# Patient Record
Sex: Male | Born: 1955 | Race: Black or African American | Hispanic: Refuse to answer | Marital: Single | Smoking: Never smoker
Health system: Southern US, Community
[De-identification: ages and names within clinical notes are randomized; demographics above are authoritative.]

## PROBLEM LIST (undated history)

## (undated) DIAGNOSIS — A86 Unspecified viral encephalitis: Secondary | ICD-10-CM

## (undated) DIAGNOSIS — I2510 Atherosclerotic heart disease of native coronary artery without angina pectoris: Secondary | ICD-10-CM

## (undated) DIAGNOSIS — G2581 Restless legs syndrome: Secondary | ICD-10-CM

## (undated) DIAGNOSIS — R351 Nocturia: Secondary | ICD-10-CM

## (undated) DIAGNOSIS — M1990 Unspecified osteoarthritis, unspecified site: Secondary | ICD-10-CM

## (undated) DIAGNOSIS — E785 Hyperlipidemia, unspecified: Secondary | ICD-10-CM

## (undated) DIAGNOSIS — M549 Dorsalgia, unspecified: Secondary | ICD-10-CM

## (undated) DIAGNOSIS — I255 Ischemic cardiomyopathy: Secondary | ICD-10-CM

## (undated) DIAGNOSIS — I00 Rheumatic fever without heart involvement: Secondary | ICD-10-CM

## (undated) DIAGNOSIS — I10 Essential (primary) hypertension: Secondary | ICD-10-CM

## (undated) DIAGNOSIS — N289 Disorder of kidney and ureter, unspecified: Secondary | ICD-10-CM

## (undated) DIAGNOSIS — R55 Syncope and collapse: Secondary | ICD-10-CM

## (undated) DIAGNOSIS — E669 Obesity, unspecified: Secondary | ICD-10-CM

## (undated) DIAGNOSIS — M2550 Pain in unspecified joint: Secondary | ICD-10-CM

## (undated) DIAGNOSIS — I440 Atrioventricular block, first degree: Secondary | ICD-10-CM

## (undated) DIAGNOSIS — N401 Enlarged prostate with lower urinary tract symptoms: Secondary | ICD-10-CM

## (undated) DIAGNOSIS — K219 Gastro-esophageal reflux disease without esophagitis: Secondary | ICD-10-CM

## (undated) HISTORY — DX: Pain in unspecified joint: M25.50

## (undated) HISTORY — PX: TONSILLECTOMY: SHX5217

## (undated) HISTORY — DX: Rheumatic fever without heart involvement: I00

## (undated) HISTORY — PX: POLYPECTOMY: SHX149

## (undated) HISTORY — DX: Unspecified viral encephalitis: A86

## (undated) HISTORY — DX: Gastro-esophageal reflux disease without esophagitis: K21.9

## (undated) HISTORY — DX: Unspecified osteoarthritis, unspecified site: M19.90

## (undated) HISTORY — PX: REPLACEMENT TOTAL KNEE: SUR1224

## (undated) HISTORY — DX: Dorsalgia, unspecified: M54.9

## (undated) HISTORY — DX: Essential (primary) hypertension: I10

## (undated) HISTORY — DX: Syncope and collapse: R55

---

## 2001-05-07 ENCOUNTER — Ambulatory Visit (HOSPITAL_BASED_OUTPATIENT_CLINIC_OR_DEPARTMENT_OTHER): Admission: RE | Admit: 2001-05-07 | Discharge: 2001-05-07 | Payer: Self-pay | Admitting: *Deleted

## 2002-08-28 ENCOUNTER — Emergency Department (HOSPITAL_COMMUNITY): Admission: EM | Admit: 2002-08-28 | Discharge: 2002-08-28 | Payer: Self-pay | Admitting: Emergency Medicine

## 2002-08-28 ENCOUNTER — Encounter: Payer: Self-pay | Admitting: Emergency Medicine

## 2002-12-02 ENCOUNTER — Inpatient Hospital Stay (HOSPITAL_COMMUNITY): Admission: RE | Admit: 2002-12-02 | Discharge: 2002-12-03 | Payer: Self-pay | Admitting: Orthopedic Surgery

## 2002-12-02 ENCOUNTER — Encounter: Payer: Self-pay | Admitting: Orthopedic Surgery

## 2004-02-23 ENCOUNTER — Inpatient Hospital Stay (HOSPITAL_COMMUNITY): Admission: RE | Admit: 2004-02-23 | Discharge: 2004-02-27 | Payer: Self-pay | Admitting: Orthopedic Surgery

## 2004-02-23 ENCOUNTER — Encounter (INDEPENDENT_AMBULATORY_CARE_PROVIDER_SITE_OTHER): Payer: Self-pay | Admitting: *Deleted

## 2004-02-29 ENCOUNTER — Ambulatory Visit (HOSPITAL_COMMUNITY): Admission: RE | Admit: 2004-02-29 | Discharge: 2004-02-29 | Payer: Self-pay | Admitting: Neurology

## 2004-12-19 ENCOUNTER — Encounter: Admission: RE | Admit: 2004-12-19 | Discharge: 2004-12-19 | Payer: Self-pay | Admitting: Orthopedic Surgery

## 2005-07-19 ENCOUNTER — Ambulatory Visit: Payer: Self-pay | Admitting: Family Medicine

## 2005-12-11 ENCOUNTER — Encounter: Admission: RE | Admit: 2005-12-11 | Discharge: 2005-12-11 | Payer: Self-pay | Admitting: Orthopedic Surgery

## 2006-09-27 ENCOUNTER — Ambulatory Visit: Payer: Self-pay | Admitting: Family Medicine

## 2007-02-18 ENCOUNTER — Ambulatory Visit: Payer: Self-pay | Admitting: Family Medicine

## 2007-02-19 LAB — CONVERTED CEMR LAB
Alkaline Phosphatase: 83 units/L (ref 39–117)
BUN: 10 mg/dL (ref 6–23)
Basophils Absolute: 0.1 10*3/uL (ref 0.0–0.1)
Chloride: 103 meq/L (ref 96–112)
Cholesterol: 205 mg/dL (ref 0–200)
Creatinine, Ser: 1 mg/dL (ref 0.4–1.5)
Eosinophils Absolute: 0.2 10*3/uL (ref 0.0–0.6)
Glucose, Bld: 79 mg/dL (ref 70–99)
HCT: 41.9 % (ref 39.0–52.0)
HDL: 28.4 mg/dL — ABNORMAL LOW (ref >39.0)
Lymphocytes Relative: 26 % (ref 12.0–46.0)
MCHC: 33.5 g/dL (ref 30.0–36.0)
MCV: 88.5 fL (ref 78.0–100.0)
Monocytes Absolute: 0.4 10*3/uL (ref 0.2–0.7)
Monocytes Relative: 2.9 % — ABNORMAL LOW (ref 3.0–11.0)
PSA: 0.72 ng/mL (ref 0.10–4.00)
Platelets: 330 10*3/uL (ref 150–400)
Potassium: 4.4 meq/L (ref 3.5–5.1)
RDW: 13 % (ref 11.5–14.6)
Sodium: 143 meq/L (ref 135–145)
Total Bilirubin: 0.6 mg/dL (ref 0.3–1.2)
VLDL: 83 mg/dL — ABNORMAL HIGH (ref 0–40)

## 2007-08-07 ENCOUNTER — Ambulatory Visit (HOSPITAL_COMMUNITY): Admission: RE | Admit: 2007-08-07 | Discharge: 2007-08-07 | Payer: Self-pay | Admitting: Orthopedic Surgery

## 2007-10-15 ENCOUNTER — Encounter (INDEPENDENT_AMBULATORY_CARE_PROVIDER_SITE_OTHER): Payer: Self-pay | Admitting: Orthopedic Surgery

## 2007-10-15 ENCOUNTER — Inpatient Hospital Stay (HOSPITAL_COMMUNITY): Admission: RE | Admit: 2007-10-15 | Discharge: 2007-10-18 | Payer: Self-pay | Admitting: Orthopedic Surgery

## 2007-10-31 DIAGNOSIS — R55 Syncope and collapse: Secondary | ICD-10-CM

## 2007-10-31 HISTORY — DX: Syncope and collapse: R55

## 2007-11-22 ENCOUNTER — Ambulatory Visit: Payer: Self-pay | Admitting: Family Medicine

## 2007-11-22 DIAGNOSIS — M1990 Unspecified osteoarthritis, unspecified site: Secondary | ICD-10-CM | POA: Insufficient documentation

## 2007-12-04 ENCOUNTER — Encounter: Payer: Self-pay | Admitting: Family Medicine

## 2007-12-04 ENCOUNTER — Ambulatory Visit: Payer: Self-pay

## 2007-12-17 ENCOUNTER — Telehealth: Payer: Self-pay | Admitting: Family Medicine

## 2008-01-20 ENCOUNTER — Ambulatory Visit: Payer: Self-pay | Admitting: Family Medicine

## 2008-04-02 ENCOUNTER — Ambulatory Visit: Payer: Self-pay | Admitting: Family Medicine

## 2008-04-02 DIAGNOSIS — K219 Gastro-esophageal reflux disease without esophagitis: Secondary | ICD-10-CM | POA: Insufficient documentation

## 2008-04-02 LAB — CONVERTED CEMR LAB
Alkaline Phosphatase: 107 units/L (ref 39–117)
BUN: 7 mg/dL (ref 6–23)
CO2: 31 meq/L (ref 19–32)
Calcium: 9.1 mg/dL (ref 8.4–10.5)
Chloride: 100 meq/L (ref 96–112)
Creatinine, Ser: 1.1 mg/dL (ref 0.4–1.5)
Eosinophils Absolute: 0.2 10*3/uL (ref 0.0–0.7)
Eosinophils Relative: 2.2 % (ref 0.0–5.0)
Folate: 10.1 ng/mL
GFR calc Af Amer: 90 mL/min
GFR calc non Af Amer: 74 mL/min
Glucose, Bld: 86 mg/dL (ref 70–99)
HCT: 39.2 % (ref 39.0–52.0)
Hemoglobin: 13.1 g/dL (ref 13.0–17.0)
MCHC: 33.4 g/dL (ref 30.0–36.0)
MCV: 85.9 fL (ref 78.0–100.0)
Monocytes Absolute: 0.7 10*3/uL (ref 0.1–1.0)
Monocytes Relative: 8.7 % (ref 3.0–12.0)
Neutrophils Relative %: 62.7 % (ref 43.0–77.0)
Platelets: 278 10*3/uL (ref 150–400)
Potassium: 3.9 meq/L (ref 3.5–5.1)
Sodium: 138 meq/L (ref 135–145)

## 2008-08-20 ENCOUNTER — Emergency Department (HOSPITAL_COMMUNITY): Admission: EM | Admit: 2008-08-20 | Discharge: 2008-08-20 | Payer: Self-pay | Admitting: Emergency Medicine

## 2008-08-20 ENCOUNTER — Ambulatory Visit: Payer: Self-pay | Admitting: Family Medicine

## 2008-08-25 ENCOUNTER — Ambulatory Visit: Payer: Self-pay | Admitting: Family Medicine

## 2008-12-30 ENCOUNTER — Telehealth: Payer: Self-pay | Admitting: *Deleted

## 2009-05-19 ENCOUNTER — Telehealth: Payer: Self-pay | Admitting: Family Medicine

## 2009-07-09 ENCOUNTER — Ambulatory Visit: Payer: Self-pay | Admitting: Family Medicine

## 2009-07-09 LAB — CONVERTED CEMR LAB
Basophils Absolute: 0 10*3/uL (ref 0.0–0.1)
Basophils Relative: 0.2 % (ref 0.0–3.0)
Bilirubin, Direct: 0.1 mg/dL (ref 0.0–0.3)
CO2: 31 meq/L (ref 19–32)
Calcium: 9.4 mg/dL (ref 8.4–10.5)
Chloride: 104 meq/L (ref 96–112)
Eosinophils Relative: 2.8 % (ref 0.0–5.0)
GFR calc non Af Amer: 99.76 mL/min (ref >60)
Glucose, Bld: 106 mg/dL — ABNORMAL HIGH (ref 70–99)
Glucose, Urine, Semiquant: NEGATIVE
HCT: 42.2 % (ref 39.0–52.0)
Lymphs Abs: 2.6 10*3/uL (ref 0.7–4.0)
MCHC: 33.7 g/dL (ref 30.0–36.0)
MCV: 88.2 fL (ref 78.0–100.0)
Monocytes Absolute: 0.7 10*3/uL (ref 0.1–1.0)
Monocytes Relative: 8.2 % (ref 3.0–12.0)
Platelets: 237 10*3/uL (ref 150.0–400.0)
RBC: 4.78 M/uL (ref 4.22–5.81)
Sodium: 140 meq/L (ref 135–145)
Total Bilirubin: 0.9 mg/dL (ref 0.3–1.2)
Triglycerides: 190 mg/dL — ABNORMAL HIGH (ref 0.0–149.0)
Urobilinogen, UA: 0.2
WBC: 8.1 10*3/uL (ref 4.5–10.5)

## 2009-12-22 ENCOUNTER — Ambulatory Visit: Payer: Self-pay | Admitting: Family Medicine

## 2010-01-21 ENCOUNTER — Telehealth: Payer: Self-pay | Admitting: Family Medicine

## 2010-08-18 ENCOUNTER — Ambulatory Visit: Payer: Self-pay | Admitting: Family Medicine

## 2010-08-18 DIAGNOSIS — R1013 Epigastric pain: Secondary | ICD-10-CM | POA: Insufficient documentation

## 2010-08-19 ENCOUNTER — Encounter: Payer: Self-pay | Admitting: Family Medicine

## 2010-08-19 LAB — CONVERTED CEMR LAB
AST: 19 units/L (ref 0–37)
Albumin: 3.9 g/dL (ref 3.5–5.2)
Amylase: 47 units/L (ref 27–131)
BUN: 12 mg/dL (ref 6–23)
Basophils Absolute: 0 10*3/uL (ref 0.0–0.1)
Chloride: 103 meq/L (ref 96–112)
Eosinophils Relative: 3.1 % (ref 0.0–5.0)
HCT: 38.8 % — ABNORMAL LOW (ref 39.0–52.0)
Lipase: 26 units/L (ref 11.0–59.0)
Lymphs Abs: 3 10*3/uL (ref 0.7–4.0)
MCHC: 34.3 g/dL (ref 30.0–36.0)
MCV: 88.8 fL (ref 78.0–100.0)
Monocytes Absolute: 0.8 10*3/uL (ref 0.1–1.0)
Monocytes Relative: 8.2 % (ref 3.0–12.0)
Platelets: 275 10*3/uL (ref 150.0–400.0)
Potassium: 4.1 meq/L (ref 3.5–5.1)
RBC: 4.37 M/uL (ref 4.22–5.81)
Total Bilirubin: 0.4 mg/dL (ref 0.3–1.2)

## 2010-09-16 ENCOUNTER — Ambulatory Visit: Payer: Self-pay | Admitting: Family Medicine

## 2010-09-16 DIAGNOSIS — G2581 Restless legs syndrome: Secondary | ICD-10-CM | POA: Insufficient documentation

## 2010-09-16 HISTORY — DX: Restless legs syndrome: G25.81

## 2010-11-01 ENCOUNTER — Encounter: Payer: Self-pay | Admitting: Family Medicine

## 2010-11-29 ENCOUNTER — Ambulatory Visit
Admission: RE | Admit: 2010-11-29 | Discharge: 2010-11-29 | Payer: Self-pay | Source: Home / Self Care | Attending: Family Medicine | Admitting: Family Medicine

## 2010-12-01 NOTE — Assessment & Plan Note (Signed)
Summary: acute painful foot/dm   Vital Signs:  Patient profile:   55 year old male Weight:      260 pounds Temp:     98.3 degrees F oral BP sitting:   162 / 100  (left arm) Cuff size:   regular  Vitals Entered By: Kern Reap CMA Duncan Dull) (December 22, 2009 9:42 AM)  Reason for Visit right heel pain  History of Present Illness: Markelle is a 55 year old male, who comes in today for evaluation of two problems.  He said pain in his right heel for two weeks.  No history of trauma.  He ran out of his Zestril 40 mg daily 3 days ago.  BP 162 over hundred  Allergies: 1)  ! Penicillin  Past History:  Past medical, surgical, family and social histories (including risk factors) reviewed for relevance to current acute and chronic problems.  Past Medical History: Reviewed history from 08/20/2008 and no changes required. rheumatic fever Hypertension Osteoarthritis encephalitis, viral GERD history of syncope  Past Surgical History: Reviewed history from 11/22/2007 and no changes required. Total knee replacement, left  Family History: Reviewed history from 01/20/2008 and no changes required. father died at 55 of prostate cancer mother is mid-70 alive and well except she has high blood pressure 4 brothers, one diabetic and hypertensive.  The other hypertensive in the two in good health 3 sisters in good health  Social History: Reviewed history from 01/20/2008 and no changes required. Occupation: [employer] Single Never Smoked Alcohol use-no Drug use-no Regular exercise-yes  Review of Systems      See HPI  Physical Exam  General:  Well-developed,well-nourished,in no acute distress; alert,appropriate and cooperative throughout examination Msk:  left TM normal, pain, and palpation of the right heel consistent with plantar fasciitis   Impression & Recommendations:  Problem # 1:  HYPERTENSION (ICD-401.9) Assessment Deteriorated  His updated medication list for this  problem includes:    Zestril 40 Mg Tabs (Lisinopril) .Marland Kitchen... 1 &1/2 qam  Orders: Prescription Created Electronically [id number])  Problem # 2:  PLANTAR FASCIITIS, RIGHT (ICD-728.71) Assessment: New  Orders: Prescription Created Electronically [phone])  Complete Medication List: 1)  Zestril 40 Mg Tabs (Lisinopril) .Marland Kitchen.. 1 &1/2 qam 2)  Viagra 50 Mg Tabs (Sildenafil citrate) .... Uad  Patient Instructions: 1)  take 600 mg of Motrin 3 times a day with food, elevation, and ice 15 minutes prior to bedtime, 60 stretches in the morning, if the pain does not go away in two to 3 weeks.  Call we would be to set up for physical therapy. 2)  Restart her blood pressure medication. 3)  cbc, lipid panel, u/a ,tsh level, liver fcn panel,bmet, and psa prior to next visit (v70.0) ........in the next two to 4 weeks Prescriptions: VIAGRA 50 MG  TABS (SILDENAFIL CITRATE) UAD  #6 Tablet x 10   Entered and Authorized by:   Roderick Pee MD   Signed by:   Roderick Pee MD on 12/22/2009   Method used:   [facility]        [facility] [address] [phone]* (retail)       [address]       [place], [place]  [address]       Ph: [phone] or [phone]       Fax: [phone]   RxID:   [id number] ZESTRIL 40 MG  TABS (LISINOPRIL) 1 &1/2 qam  #150 x 3   Entered  and Authorized by:   Roderick Pee MD   Signed by:   Roderick Pee MD on 12/22/2009   Method used:   [facility]        [facility] [address] [phone]* (retail)       [address]       [place], [place]  [address]       Ph: [phone] or [phone]       Fax: [phone]   RxID:   [id number]

## 2010-12-01 NOTE — Assessment & Plan Note (Signed)
Summary: not feeling well//ccm   Vital Signs:  Patient profile:   55 year old male Height:      71 inches Weight:      259 pounds BMI:     36.25 Temp:     98.5 degrees F oral BP sitting:   140 / 98  (left arm) Cuff size:   regular  Vitals Entered By: Kern Reap CMA Duncan Dull) (August 18, 2010 3:23 PM) CC: chest pressure Is Patient Diabetic? No Pain Assessment Patient in pain? yes     Location: chest Intensity: 3   CC:  chest pressure.  History of Present Illness: Jacob Quinn is a 55 year old male, nonsmoker, who comes in today for evaluation of abdominal pain.  He states the pain started about 3 weeks ago.  It's episodic.  It may occur 3 or 4 times a day.  He describes the pain as dull points to his mid epigastrium is a source of his discomfort.  It usually occurs after eating especially fatty foods last about an hour and then goes away.  He said no fever, chills, nausea, vomiting, diarrhea, no urinary tract symptoms.  Review of systems otherwise negative.  Family history negative for gallbladder disease  Allergies: 1)  ! Penicillin PMH-FH-SH reviewed-no changes except otherwise noted  Family History: Reviewed history from 01/20/2008 and no changes required. father died at 97 of prostate cancer mother is mid-70 alive and well except she has high blood pressure 4 brothers, one diabetic and hypertensive.  The other hypertensive in the two in good health 3 sisters in good health  Social History: Reviewed history from 01/20/2008 and no changes required. Occupation: [employer] Single Never Smoked Alcohol use-no Drug use-no Regular exercise-yes  Review of Systems      See HPI  Physical Exam  General:  Well-developed,well-nourished,in no acute distress; alert,appropriate and cooperative throughout examination Abdomen:  the abdomen is flat.  The bowel sounds are normal.  There is some tenderness right upper quadrant and midepigastrium otherwise no palpable  masses   Problems:  Medical Problems Added: 1)  Dx of Abdominal Pain, Epigastric  (ICD-789.06)  Impression & Recommendations:  Problem # 1:  ABDOMINAL PAIN, EPIGASTRIC (ICD-789.06) Assessment New  Orders: Venipuncture (11914) TLB-BMP (Basic Metabolic Panel-BMET) (80048-METABOL) TLB-CBC Platelet - w/Differential (85025-CBCD) TLB-Hepatic/Liver Function Pnl (80076-HEPATIC) TLB-Amylase (82150-AMYL) TLB-Lipase (83690-LIPASE) TLB-H. Pylori Abs(Helicobacter Pylori) (86677-HELICO) Specimen Handling ([address]) Radiology Referral (Radiology)  Complete Medication List: 1)  Zestril 40 Mg Tabs (Lisinopril) .Marland Kitchen.. 1 &1/2 qam 2)  Viagra 50 Mg Tabs (Sildenafil citrate) .... Uad  Patient Instructions: 1)  stay on a complete fat free diet. 2)  We will get her lab work today and you to set up for an ultrasound of the gallbladder. 3)  Return a day or two after your ultrasound to review the findings. 4)  In the meantime, if y  pain gets worse.  Start running fever, vomiting, anything unusual, come directly to the hospital emergency room   Orders Added: 1)  Venipuncture [36415] 2)  TLB-BMP (Basic Metabolic Panel-BMET) [80048-METABOL] 3)  TLB-CBC Platelet - w/Differential [85025-CBCD] 4)  TLB-Hepatic/Liver Function Pnl [80076-HEPATIC] 5)  TLB-Amylase [82150-AMYL] 6)  TLB-Lipase [83690-LIPASE] 7)  TLB-H. Pylori Abs(Helicobacter Pylori) [86677-HELICO] 8)  Est. Patient Level IV [62130] 9)  Specimen Handling [99000] 10)  Radiology Referral [Radiology]

## 2010-12-01 NOTE — Miscellaneous (Signed)
Summary: rx update  Clinical Lists Changes  Medications: Added new medication of OMEPRAZOLE 20 MG CPDR (OMEPRAZOLE) take one tab by mouth two times a day - Signed Added new medication of BIAXIN 500 MG TABS (CLARITHROMYCIN) take one tab by mouth two times a day  for 2 weeks - Signed Added new medication of METRONIDAZOLE 500 MG TABS (METRONIDAZOLE) take one tab by mouth two times a day - Signed Rx of OMEPRAZOLE 20 MG CPDR (OMEPRAZOLE) take one tab by mouth two times a day;  #180 x 3;  Signed;  Entered by: Kern Reap CMA (AAMA);  Authorized by: Roderick Pee MD;  Method used: [facility] [address] [phone]*, [address] [place], [place], [place]  [address], Ph: [phone] or [phone], Fax: [phone] Rx of BIAXIN 500 MG TABS (CLARITHROMYCIN) take one tab by mouth two times a day  for 2 weeks;  #14 x 0;  Signed;  Entered by: Kern Reap CMA (AAMA);  Authorized by: Roderick Pee MD;  Method used: Electronically to [facility] [address] [phone]*, [address] [place], [place], [place]  [address], Ph: [phone] or [phone], Fax: [phone] Rx of METRONIDAZOLE 500 MG TABS (METRONIDAZOLE) take one tab by mouth two times a day;  #14 x 0;  Signed;  Entered by: Kern Reap CMA (AAMA);  Authorized by: Roderick Pee MD;  Method used: Electronically to [facility] [address] [phone]*, [address] [place], [place], [place]  [address], Ph: [phone] or [phone], Fax: [phone]    Prescriptions: METRONIDAZOLE 500 MG TABS (METRONIDAZOLE) take one tab by mouth two times a day  #14 x 0   Entered by:   Kern Reap CMA (AAMA)   Authorized by:   Roderick Pee MD   Signed by:   Kern Reap CMA (AAMA) on 08/19/2010   Method used:   Electronically to        [facility] [address] [phone]* (retail)       [address]       [place], [place]  [address]       Ph: [phone] or [phone]       Fax: [phone]   RxID:    [id number] BIAXIN 500 MG TABS (CLARITHROMYCIN) take one tab by mouth two times a day  for 2 weeks  #14 x 0   Entered by:   Kern Reap CMA (AAMA)   Authorized by:   Roderick Pee MD   Signed by:   Kern Reap CMA (AAMA) on 08/19/2010   Method used:   Electronically to        [facility] [address] [phone]* (retail)       [address]       [place], [place]  [address]       Ph: [phone] or [phone]       Fax: [phone]   RxID:   [id number] OMEPRAZOLE 20 MG CPDR (OMEPRAZOLE) take one tab by mouth two times a day  #180 x 3   Entered by:   Kern Reap CMA (AAMA)   Authorized by:   Roderick Pee MD   Signed by:   Kern Reap CMA (AAMA) on 08/19/2010   Method used:   Electronically to        [facility] [address] [phone]* (retail)       [address]  [place], [place]  [address]       Ph: [phone] or [phone]       Fax: [phone]   RxID:   [id number]

## 2010-12-01 NOTE — Letter (Signed)
Summary: Referral - not able to see patient  [facility]  [address] [place], [place] [address]   Phone: [phone]  Fax: [phone]    November 01, 2010    Tinnie Gens A. Tawanna Cooler, M.D. [address] [place], [place] [address]   Re:   Jacob Quinn DOB:  28-Jul-1956 MRN:   [id number]    Dear Dr. Tawanna Cooler:  Thank you for your kind referral of the above patient.  We have attempted to schedule the recommended procedure Screening Colonoscopy but have not been able to schedule because:   X  The patient was not available by phone and/or has not returned our calls.  ___ The patient declined to schedule the procedure at this time.  We appreciate the referral and hope that we will have the opportunity to treat this patient in the future.    Sincerely,    [facility] [phone]

## 2010-12-01 NOTE — Assessment & Plan Note (Signed)
Summary: Follow up/cb   Vital Signs:  Patient profile:   55 year old male Weight:      258 pounds Temp:     98.2 degrees F oral BP sitting:   150 / 98  (left arm) Cuff size:   regular  Vitals Entered By: Kern Reap CMA Duncan Dull) (September 16, 2010 3:39 PM) CC: follow-up visit   CC:  follow-up visit.  History of Present Illness: Jacob Quinn is a 55 year old male, who comes in today for follow-up of two problems.  He had H. pylori positive abdominal pain and was placed on Flagyl, Biaxin, and omeprazole.  He finished the Flagyl and Biaxin continues to take omeprazole one daily.  Asymptomatic.  BP today 150/98.  He states he didn't take his blood pressure medication.  This morning, because it's causing a nighttime leg cramps.  Explain the nighttime.  Right cramps related to restless leg syndrome and not his medication  Allergies: 1)  ! Penicillin  Past History:  Past medical, surgical, family and social histories (including risk factors) reviewed for relevance to current acute and chronic problems.  Past Medical History: Reviewed history from 08/20/2008 and no changes required. rheumatic fever Hypertension Osteoarthritis encephalitis, viral GERD history of syncope  Past Surgical History: Reviewed history from 11/22/2007 and no changes required. Total knee replacement, left  Family History: Reviewed history from 01/20/2008 and no changes required. father died at 70 of prostate cancer mother is mid-70 alive and well except she has high blood pressure 4 brothers, one diabetic and hypertensive.  The other hypertensive in the two in good health 3 sisters in good health  Social History: Reviewed history from 01/20/2008 and no changes required. Occupation: [employer] Single Never Smoked Alcohol use-no Drug use-no Regular exercise-yes  Review of Systems      See HPI       Flu Vaccine Consent Questions     Do you have a history of severe allergic reactions to this  vaccine? no    Any prior history of allergic reactions to egg and/or gelatin? no    Do you have a sensitivity to the preservative Thimersol? no    Do you have a past history of Guillan-Barre Syndrome? no    Do you currently have an acute febrile illness? no    Have you ever had a severe reaction to latex? no    Vaccine information given and explained to patient? yes    Are you currently pregnant? no    Lot Number:[id number]   Exp Date:04/29/2011   Site Given  Left Deltoid IM   Physical Exam  General:  Well-developed,well-nourished,in no acute distress; alert,appropriate and cooperative throughout examination Abdomen:  Bowel sounds positive,abdomen soft and non-tender without masses, organomegaly or hernias noted.   Impression & Recommendations:  Problem # 1:  ABDOMINAL PAIN, EPIGASTRIC (ICD-789.06) Assessment Improved  Problem # 2:  HYPERTENSION (ICD-401.9) Assessment: Deteriorated  His updated medication list for this problem includes:    Zestril 40 Mg Tabs (Lisinopril) .Marland Kitchen... 1 &1/2 qam  Problem # 3:  RESTLESS LEG SYNDROME (ICD-333.94) Assessment: New  Complete Medication List: 1)  Zestril 40 Mg Tabs (Lisinopril) .Marland Kitchen.. 1 &1/2 qam 2)  Viagra 50 Mg Tabs (Sildenafil citrate) .... Uad 3)  Omeprazole 20 Mg Cpdr (Omeprazole) .... Take one tab by mouth two times a day 4)  Metronidazole 500 Mg Tabs (Metronidazole) .... Take one tab by mouth two times a day 5)  Requip 1 Mg Tabs (Ropinirole hcl) .Marland Kitchen.. 1 tab @ bedtime  Other Orders: Admin 1st Vaccine (16109) Flu Vaccine 15yrs + [phone]) Gastroenterology Referral (GI)  Patient Instructions: 1)  take one omeprazole daily in the morning for two months, then stop. 2)  Take your blood pressure medication daily. 3)  Requip 1 mg nightly for restless leg syndrome Prescriptions: REQUIP 1 MG TABS (ROPINIROLE HCL) 1 tab @ bedtime  #100 x 3   Entered and Authorized by:   Roderick Pee MD   Signed by:   Roderick Pee MD on 09/16/2010    Method used:   [facility]ronically to        [facility] [address] [phone]* (retail)       [address]       [place], [place]  [address]       Ph: [phone] or [phone]       Fax: [phone]   RxID:   [id number]    Orders Added: 1)  Admin 1st Vaccine [90471] 2)  Flu Vaccine 25yrs + [25956] 3)  Est. Patient Level IV [38756] 4)  Gastroenterology Referral [GI]

## 2010-12-01 NOTE — Progress Notes (Signed)
Summary: REFILLS  Phone Note Refill Request Message from:  Fax from Pharmacy  Refills Requested: Medication #1:  ZESTRIL 40 MG  TABS 1 &1/2 qam   Brand Name Necessary? No  Medication #2:  VIAGRA 50 MG  TABS UAD. [facility] FAX---[phone]  Initial call taken by: Warnell Forester,  January 21, 2010 3:37 PM    Prescriptions: VIAGRA 50 MG  TABS (SILDENAFIL CITRATE) UAD  #6 Tablet x 10   Entered by:   Kern Reap CMA (AAMA)   Authorized by:   Roderick Pee MD   Signed by:   Kern Reap CMA (AAMA) on 01/21/2010   Method used:   Electronically to        [facility]* (mail-order)             ,          Ph: [phone]       Fax: [phone]   RxID:   [id number] ZESTRIL 40 MG  TABS (LISINOPRIL) 1 &1/2 qam  #150 x 3   Entered by:   Kern Reap CMA (AAMA)   Authorized by:   Roderick Pee MD   Signed by:   Kern Reap CMA (AAMA) on 01/21/2010   Method used:   Electronically to        [facility]* (mail-order)             ,          Ph: [phone]       Fax: [phone]   RxID:   [id number]

## 2010-12-07 NOTE — Assessment & Plan Note (Signed)
Summary: chlamydia?/dm   Vital Signs:  Patient profile:   55 year old male Weight:      254 pounds Temp:     97.5 degrees F oral BP sitting:   150 / 98  (left arm) Cuff size:   regular  Vitals Entered By: Kern Reap CMA Duncan Dull) (November 29, 2010 3:49 PM) CC: std concerns   CC:  std concerns.  History of Present Illness: Jacob Quinn is a 55 year old single male, who comes in today because his girlfriend was told she had chlamydia.  They do not use a condom.  Review of systems otherwise negative.  We saw him for his physical exam while back and recommend he have a colonoscopy.  He states nobody ever called  Allergies: 1)  ! Penicillin  Past History:  Past medical, surgical, family and social histories (including risk factors) reviewed for relevance to current acute and chronic problems.  Past Medical History: Reviewed history from 08/20/2008 and no changes required. rheumatic fever Hypertension Osteoarthritis encephalitis, viral GERD history of syncope  Past Surgical History: Reviewed history from 11/22/2007 and no changes required. Total knee replacement, left  Family History: Reviewed history from 01/20/2008 and no changes required. father died at 52 of prostate cancer mother is mid-70 alive and well except she has high blood pressure 4 brothers, one diabetic and hypertensive.  The other hypertensive in the two in good health 3 sisters in good health  Social History: Reviewed history from 01/20/2008 and no changes required. Occupation: [employer] Never Smoked Alcohol use-no Drug use-no Regular exercise-yes  Review of Systems      See HPI  Physical Exam  General:  Well-developed,well-nourished,in no acute distress; alert,appropriate and cooperative throughout examination   Impression & Recommendations:  Problem # 1:  UNSPECIFIED VENEREAL DISEASE (ICD-099.9) Assessment New  Complete Medication List: 1)  Zestril 40 Mg Tabs (Lisinopril) .Marland Kitchen..  1 &1/2 qam 2)  Viagra 50 Mg Tabs (Sildenafil citrate) .... Uad 3)  Omeprazole 20 Mg Cpdr (Omeprazole) .... Take one tab by mouth two times a day 4)  Requip 1 Mg Tabs (Ropinirole hcl) .Marland Kitchen.. 1 tab @ bedtime 5)  Doxycycline Hyclate 100 Mg Caps (Doxycycline hyclate) .... Take 1 tablet by mouth two times a day  Other Orders: Gastroenterology Referral (GI)  Patient Instructions: 1)  take the doxycycline, 100 mg twice daily for two weeks. 2)  In the future.  I would recommend that you use a condom Prescriptions: DOXYCYCLINE HYCLATE 100 MG CAPS (DOXYCYCLINE HYCLATE) Take 1 tablet by mouth two times a day  #30 x 0   Entered and Authorized by:   Roderick Pee MD   Signed by:   Roderick Pee MD on 11/29/2010   Method used:   Electronically to        [facility] [address] [phone]* (retail)       [address]       [place], [place]  [address]       Ph: [phone] or [phone]       Fax: [phone]   RxID:   [id number]    Orders Added: 1)  Est. Patient Level III [66063] 2)  Gastroenterology Referral [GI]

## 2011-02-28 ENCOUNTER — Ambulatory Visit (INDEPENDENT_AMBULATORY_CARE_PROVIDER_SITE_OTHER): Payer: BC Managed Care – PPO | Admitting: Family Medicine

## 2011-02-28 ENCOUNTER — Encounter: Payer: Self-pay | Admitting: Family Medicine

## 2011-02-28 VITALS — BP 120/86 | Temp 98.3°F | Ht 71.0 in | Wt 247.0 lb

## 2011-02-28 DIAGNOSIS — J020 Streptococcal pharyngitis: Secondary | ICD-10-CM

## 2011-02-28 DIAGNOSIS — J029 Acute pharyngitis, unspecified: Secondary | ICD-10-CM

## 2011-02-28 MED ORDER — CEPHALEXIN 500 MG PO CAPS: ORAL_CAPSULE | ORAL | Status: AC

## 2011-02-28 NOTE — Progress Notes (Signed)
  Subjective:    Patient ID: Jacob Quinn, male    DOB: 26-Jan-1956, 55 y.o.   MRN: [id number]  HPI  Jacob Quinn is a 55 year old male, who comes in today for evaluation of a sore throat x 2 days.  He states that his sore throat with fever and chills x 2 days.  No earache, cough, et Karie Soda, et Karie Soda.  He's not been in contact with, anybody who's been ill.  Rapid strep positive    Review of Systems General an infectious review of systems otherwise negative.  He does have a history of a penicillin reaction.  He thinks he got a rash when he was 55 years of age.  He is not sure exactly what happened    Objective:   Physical Exam Well-developed well-nourished, male in no acute distress.  Examination of the it.  HEENT was negative, except for red throat rapid strep positive.  Also, A. Ulcerated lesion on the left side of his tongue.       Assessment & Plan:  Strep throat,,,,,,,,,, Keflex two tabs b.i.d. Return p.r.n. Cautioned about reaction to medication

## 2011-02-28 NOTE — Patient Instructions (Signed)
Take to Keflex tablets twice daily pill bottle empty.  Return p.r.n.

## 2011-03-14 NOTE — Op Note (Signed)
NAME:  Jacob Quinn, Jacob Quinn             ACCOUNT NO.:  [id number]   MEDICAL RECORD NO.:  [id number]          PATIENT TYPE:  INP   LOCATION:  5011                         FACILITY:  [facility]   PHYSICIAN:  Burnard Bunting, M.D.    DATE OF BIRTH:  January 06, 1956   DATE OF PROCEDURE:  10/15/2007  DATE OF DISCHARGE:                               OPERATIVE REPORT   PREOPERATIVE DIAGNOSIS:  Loose left TKA.   POSTOPERATIVE DIAGNOSIS:  Loose left TKA.   PROCEDURE:  Revision left TKA femur and tibia.   SURGEON:  Burnard Bunting, M.D.   ANESTHESIA:  General endotracheal.   ESTIMATED BLOOD LOSS:  300   INDICATIONS:  Jacob Quinn is a 55 year old patient with left BKA who  presents now for revision.  In the office he has had 2 blood draws with  CBC, differential, sedimentation rate, C-reactive protein -- all well  within normal limits within the past 3-4 months.   OPERATIVE FINDINGS:  1. Loose tibia.  2. Loose femur.  3. Some hemosiderin laden synovitis.  4. No obvious infection.  5. One high-powered field count with 7 white cells per high-power      field.  A second high-powered field showed primarily lymphocytes      and fibrin.   SPECIMENS:  Synovium x1 sent to pathology.   CULTURES:  Times 2 sent to microbiology with intraoperative gram stain  negative for polys or organisms.   ASSISTANT:  Jerolyn Shin. Tresa Res, M.D.   PROCEDURE IN DETAIL:  The patient brought to the operating room where  general endotracheal anesthesia was induced.  Preoperative antibiotics  were administered.  The left leg was prepped with DuraPrep solution  including the foot and draped in a sterile manner.  [name] was used to  cover the operative field.  The leg was elevated, exsanguinated with  Esmarch wrap tourniquet was inflated.  Anterior approach to the knee was  utilized, skin and subcutaneous tissue was sharply divided.   Median parapatellar arthrotomy to the knee was made.  Upon entering the  knee clear serous  fluid was encountered.  This was sent for culture.  Tibial component was loose and was removed by hand.  Femoral component  was also somewhat loose and required a thin-blade osteotome, and a thin-  blade oscillating saw and osteotomes for removal.  Minimal blood loss  had occurred on the femur; however, significant bone loss had occurred  on the tibial, because of settling and looseness.  Cement was removed  from both sides.  A total synovectomy was performed, in both anterior-  and-posterior compartments with care being taken to avoid injury to the  neurovascular structures posteriorly.  At this time, the specimen was  sent to pathology for analysis.  No obvious infection was present.  It  was decided, at that time, because of his absence of systemic symptoms,  low preoperative sedimentation rate, C-reactive protein, and no obvious  inflammation greater than 10 high-powered cells per field to proceed  with revision.   At this time the tibia was hand-reamed in the canal.  The patient had a  contained  central defect.  Once good cortical fit had been obtained with  a tibial shaft reamer, conical reaming and broaching was performed.  The  trial was then placed into the tibia with 1-2 mm of freshening cut made  around the periphery of the bone.  The tibial tibia was slightly below  the level of the fibular head.  At this time, a similar broaching was  performed into the femoral canal.  Stemmed component with a __________  proximally in the broach more distally was performed.  Augments were  going to be required.  Freshening cuts were made on the distal femur,  and then in the chamfer fashion which necessitated the distal femoral  augmentation as well as posterior medial augmentation of 8 mm and  posterolateral augmentation of 4 mm.   At this time the patella was inspected and found to be intact with and  without loosening.  At this time with trial components in place a 22-1/2  mm insert was  placed.  The box cut was made for constrained liner.  With  a 22.5 mm poly, the joint line was restored to near normal judged by the  position of the patella.  The patient achieved full extension, full  flexion with good patellar stability.  At this time, trial components  were removed.  Cut bony surfaces were irrigated.  Tourniquet was  released after 2 hours.  The true components were then put together on  the back table.  Tobramycin was added to the cement.   The tourniquet was reinflated after about 45 minutes of being deflated  back up to 300 mmHg for an additional 25 minutes, and components were  then cemented into position.  Excess cement was removed, cement and was  allowed to harden, same stability parameters were maintained with  stability of varus-valgus stress at 0, 30, and 90 degrees of flexion.  Patella tracking was excellent.  Tourniquet was, again, released and  bleeding points were coagulated with electrocautery.  A thorough  irrigation was performed, and incision was then closed over a bolster  and Hemovac drain using interrupted inverted #1 Vicryl suture, #0 Vicryl  suture, interrupted inverted 2-0 Vicryl suture, and skin staples.  The  solution of Marcaine with plain clonidine was injected into the knee.  The patient was then placed in a bulky dressing and knee immobilizer.  He tolerated the procedure well without immediate complications. It  should be noted that Dr. Lenny Pastel assistance was required at all times  during the entire case for retraction of important neurovascular  structures and positioning of the limb first component removal.  His  presence was a medical necessity for the entire case.      Burnard Bunting, M.D.  Electronically Signed     GSD/MEDQ  D:  10/15/2007  T:  10/16/2007  Job:  [id number]

## 2011-03-17 NOTE — Discharge Summary (Signed)
NAME:  Jacob Quinn, Jacob Quinn                         ACCOUNT NO.:  [id number]   MEDICAL RECORD NO.:  [id number]                   PATIENT TYPE:  INP   LOCATION:  5021                                 FACILITY:  [facility]   PHYSICIAN:  Burnard Bunting, M.D.                 DATE OF BIRTH:  [birth year]   DATE OF ADMISSION:  02/23/2004  DATE OF DISCHARGE:  02/27/2004                                 DISCHARGE SUMMARY   DISCHARGE DIAGNOSES:  Left knee replacement.   HOSPITAL COURSE:  The patient is a 55 year old patient with left knee  arthritis.  He underwent left total knee replacement on February 23, 2004.  The  patient was started on Coumadin for DVT prophylaxis as well as CPM machine.  Physical therapy saw the patient on postoperative day #1 for mobilization.  The patient had an unremarkable recovery.  He had good dorsiflexion, plantar  flexion, and perfusion to the foot on postoperative day #1.  Incision was  intact at the time of discharge.  Postoperative hematocrit was 29 the day  before discharge.  The patient was discharged home in good condition.  He  will be followed up by home health care for physical therapy and blood draws  for Coumadin.  He will be weightbearing as tolerated.   DISCHARGE MEDICATIONS:  1. Coumadin.  2. Percocet.  3. Robaxin.  4. Preoperative medications.                                                Burnard Bunting, M.D.    GSD/MEDQ  D:  03/30/2004  T:  03/31/2004  Job:  [id number]

## 2011-03-17 NOTE — Op Note (Signed)
[place]  Patient:    Jacob Quinn, Jacob Quinn                      MRN: [id number] Proc. Date: 05/07/01 Adm. Date:  [id number] Attending:  Maryanna Shape                           Operative Report  PREOPERATIVE DIAGNOSIS:  Right knee posterior horn tear, medial meniscus, and early osteoarthritis.  POSTOPERATIVE DIAGNOSES: 1. Posterior horn tear of medial meniscus. 2. Old anterior cruciate tear. 3. Anterior horn tear of the lateral meniscus. 4. Grade 4 chondromalacic changes, medial joint space.  PROCEDURE:  Arthroscopic examination with partial medial meniscectomy, partial lateral meniscectomy, and minor debridement of chondromalacia, right knee.  ANESTHESIA:  Local with sedation.  DESCRIPTION OF PROCEDURE:  The patient was given sedation and the right knee blocked.  Then an anteromedial and anterolateral portals were established and the knee examined.  There was a moderate amount of yellow fluid with debris washed out of the joint.  The synovium appeared irritated.  The patella surface had fairly good articular cartilage.  The medial femoral condyle had a fairly large area that was almost down to subchondral bone.  The tibial plateau had several areas that were down to subchondral bone.  There was a degenerative flap tear in the posterior third of the medial meniscus.  The inner condylar notch was narrowed and rimmed with osteophytes, and there was an old anterior cruciate tear in the remnant, pretty well attached down to the tibia and did not seem to be impinging.  At the lateral joint space the articular surfaces were pretty well intact.  There was an anterior horn flap tear and the anterior horn was resected with a rotary meniscotome.  Otherwise the lateral joint space was fairly well preserved.  I used basket forceps to remove the posterior horn of the medial meniscus, then trimmed this up to smooth and stable tissue, using a rotary  meniscotome. Some of the rougher edges of the chondromalacia of the medial femoral condyle were trimmed back using the rotary meniscotome.  Then in the notch, I did not feel that the cruciate needed to be debrided.  It was stuck down.  Laterally there was an anterior horn tear of the lateral meniscus, and this was trimmed back to stable tissue.  Following this, the tourniquet which had been up for 20-30 minutes was let down right after the portals were closed with #5-0 nylon and 10 cc of Marcaine with epinephrine were injected into the joint.  The extremity was wrapped to above the ankle to above the knee with Webril and then an Ace wrap.  The patient returned to the recovery room in good condition.  PATIENT INSTRUCTIONS:  Frequent quad sets, frequent ankle pops, cold packs as tolerated.  Vicodin for pain.  Minimize knee motions for the first few days. Call me with concerns.  Otherwise, see me in the office as planned. DD:  05/07/01 TD:  05/07/01 Job: [id number]

## 2011-03-17 NOTE — Discharge Summary (Signed)
NAME:  Jacob Quinn, Jacob Quinn             ACCOUNT NO.:  [id number]   MEDICAL RECORD NO.:  [id number]          PATIENT TYPE:  INP   LOCATION:  5011                         FACILITY:  [facility]   PHYSICIAN:  Burnard Bunting, M.D.    DATE OF BIRTH:  [birth year]   DATE OF ADMISSION:  10/15/2007  DATE OF DISCHARGE:  10/18/2007                               DISCHARGE SUMMARY   ADMISSION DIAGNOSIS:  1. Loosening of left total knee arthroplasty.  2. Hypertension.   DISCHARGE DIAGNOSIS:  1. Loosening of left total knee arthroplasty.  2. Hypertension.  3. Mild posthemorrhagic anemia.   PROCEDURE:  On October 15, 2007, the patient underwent revision left  total knee arthroplasty performed by Dr. August Saucer.   CONSULTATIONS:  None.   BRIEF HOSPITAL COURSE:  The patient underwent the above-stated procedure  under general anesthesia without complications.  Intraoperatively  cultures were taken.  These all came back without growth.  The patient  was treated with clindamycin perioperatively.  He was started on  Coumadin for DVT prophylaxis.  Coumadin management per pharmacy.  He was  started on the usual physical therapy program for range of motion,  strengthening, and stretching exercises.  CPM utilized as well as active  range of motion.  Hemovac drain discontinued on the first postoperative  day and wound healing well throughout the remainder of the hospital  stay.  The patient was able to ambulate 150 feet with a rolling walker  prior to discharge.  He was allowed weightbearing as tolerated on the  operative extremity.  The patient was voiding without difficulty once  his Foley catheter was discontinued. He was taking a regular diet.   OTHER PERTINENT LABORATORY VALUES:  CBC on admission with hemoglobin and  hematocrit 13.9 and 41.3.  At discharge hemoglobin 9.3, hematocrit 26.3.  INR at discharge 2.5.  Chemistry studies with hyponatremia at 134.  Urinalysis negative for urinary tract infection.  Body  fluid cultures  from intraoperatively as well as direct smears all without growth.  Urine cultures no growth as well.   PLAN:  The patient was discharged to his home.  Durable medical  equipment made available.  CPM at home 2 hours per 8 hours.  Home health  physical therapy for ambulation and gait training and active range of  motion.  Knee immobilizer when ambulating.  He will keep his incision  dry and clean and follow up with Dr. August Saucer 2 weeks from surgery.  Medications at discharge Percocet, Robaxin and Coumadin.  All questions  encouraged and answered.      Wende Neighbors, P.A.      Burnard Bunting, M.D.  Electronically Signed    SMV/MEDQ  D:  11/07/2007  T:  11/08/2007  Job:  [id number]

## 2011-03-17 NOTE — Op Note (Signed)
NAME:  Jacob Quinn, Jacob Quinn                         ACCOUNT NO.:  [id number]   MEDICAL RECORD NO.:  [id number]                   PATIENT TYPE:  INP   LOCATION:  5021                                 FACILITY:  [facility]   PHYSICIAN:  Burnard Bunting, M.D.                 DATE OF BIRTH:  1956/06/27   DATE OF PROCEDURE:  02/23/2004  DATE OF DISCHARGE:                                 OPERATIVE REPORT   PREOPERATIVE DIAGNOSIS:  Left knee arthritis.   POSTOPERATIVE DIAGNOSIS:  Left knee arthritis.   OPERATION PERFORMED:  Left total knee replacement.   SURGEON:  Burnard Bunting, M.D.   ASSISTANT:  Jerolyn Shin. Tresa Res, M.D.   ANESTHESIA:  General endotracheal.   ESTIMATED BLOOD LOSS:  100 mL.   DRAINS:  Hemovac times one.   TOURNIQUET TIME:  125 minutes at 300 mmHg.   COMPONENTS:  A cemented posterior stabilized 11 femur, 9 tibia, 7 patella  with 10 mm poly.   DESCRIPTION OF PROCEDURE:  The patient was brought to the operating room  where general endotracheal anesthesia was induced.  Preoperative intravenous  antibiotics were administered.  The left leg was prepped with DuraPrep  solution and draped in sterile manner.  The operative field was covered with  Ioban.  The leg was elevated and exsanguinated with the Esmarch wrap.  Tourniquet was inflated.  Anterior incision was made over a bolster.  Skin  and subcutaneous tissue was sharply divided.  Median parapatellar arthrotomy  was made.  The precise location of the arthrotomy was localized with a #1  Vicryl suture.  The patella was everted.  Complete synovectomy was performed  and some of the synovium was sent to pathology for analysis.  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  For  that reason, complete synovectomy was performed.  Lateral patellofemoral  ligament was released. After that was partially excised, osteophytes were  removed.  The anterior cruciate ligament and posterior cruciate  ligament  were resected.  The anterior horns of the medial and lateral menisci were  released.  Patella was everted and distal femoral cut was then made using  intramedullary alignment.  A 12 mm distal cut was performed in 5 degrees of  valgus.  The femur was then sized to a size 11 which would not notch the  anterior cortex.  Correct rotational alignment was made parallel to the  transepicondylar axis.  The femur was then cut.  The chamfer cutting block  and the collateral ligaments well protected.  At this time a tibial cut was  performed.  Intramedullary alignment was utilized.  Mechanical axis via  tibial cut was made perpendicular to mechanical axis of the tibia.  Following resection of this tibia, the trial components were placed.  The  correct rotational alignment of the tibial component was marked.  The knee  had excellent stability in three degrees of  hyperextension.  The knee had  excellent stability in full extension, midflexion and full flexion.  He had  excellent patellar tracking and no lift off.  At this time the patella was  prepared.  It measured 25 mm with calcars.  11 mm resection was performed  and size 7 trial patellar button was placed.  Again range of motion and  patellar stability parameters remained unchanged.  At that time the trial  components were removed and tibial keel punching was performed.  The knee  joint was thoroughly irrigated, posterior capsular release was performed by  using blunt dissectors using blunt Hohmann in the intercondylar notch  region.  Posterior osteophytes were also removed from the posteromedial and  posterolateral femoral condyles.  At this time following irrigation, the  components were cemented into position beginning at the tibia, femur and  patella.  The size 10 poly gave excellent range of motion, stability and  patellar tracking.  The 10 spacer was placed.  At this time the tourniquet  was released.  Bleeding points were  encountered controlled using  electrocautery.  The arthrotomy was then closed using a combination of #1  Ethibond and #1 Vicryl suture with the knee over a bolster.  ___________ was  achieved.  Hemovac was placed.  Skin was closed using interrupted inverted 0  Vicryl, 2-0 Vicryl and skin staples.  The patient was then placed into a  bulky dressing and knee immobilizer, tolerated the procedure without  immediate complications.                                               Burnard Bunting, M.D.    GSD/MEDQ  D:  02/25/2004  T:  02/25/2004  Job:  [id number]

## 2011-03-17 NOTE — Op Note (Signed)
NAME:  Jacob Quinn, Jacob Quinn                         ACCOUNT NO.:  [id number]   MEDICAL RECORD NO.:  [id number]                   PATIENT TYPE:  INP   LOCATION:  5022                                 FACILITY:  [facility]   PHYSICIAN:  Burnard Bunting, M.D.                 DATE OF BIRTH:  [birth year]   DATE OF PROCEDURE:  12/02/2002  DATE OF DISCHARGE:  12/03/2002                                 OPERATIVE REPORT   PREOPERATIVE DIAGNOSIS:  Right knee varus without arthrosis.   POSTOPERATIVE DIAGNOSIS:  Right knee varus without arthrosis.   PROCEDURE:  Right knee medial unicompartmental arthroplasty with partial  medial facet patellectomy.   SURGEON:  Burnard Bunting, M.D.   ASSISTANT:  Colon Flattery. Ollen Bowl, M.D.   ANESTHESIA:  General endotracheal.   ESTIMATED BLOOD LOSS:  100 mL.   DRAINS:  None.   MEDICATIONS:  A postoperative pain pump was utilized.   TOURNIQUET TIME:  Two hours at 300 mmHg.   DESCRIPTION OF PROCEDURE:  The patient was brought to the operating room,  where general endotracheal anesthesia was induced, preoperative IV  antibiotics were administered.  The patient was positioned on the table such  that the hip was flexed to about 20, _________ flexed to about 20 degrees.  The left leg was allowed to hang free.  It was wrapped in an Ace wrap.  The  right leg was positioned on a leg holder with the peroneal nerve well-padded  and the leg able to hyperflex to 100 degrees.  The right leg was prepped  with DuraPrep solution and draped in a sterile manner.  [name] was used to  cover the operative field.  The leg was elevated with the Esmarch wrap, the  tourniquet was inflated.  A skin incision was made from about one inch below  the joint line to an inch above the equator of the patella.  Skin and  subcutaneous tissue were sharply divided.  A median parapatellar arthrotomy  was made on the medial aspect of the patellar tendon.  The fat pad was  partially excised.  Two Kocher clamps  were placed on the arthrotomy for  retraction.  The anterior horn of the medial meniscus was incised.  A  periosteal sleeve was elevated off the anteromedial aspect of the tibia.  This was taken back to the semimembranosus bursa.  Anterior meniscus was  excised under direct visualization.  Excessive wear on the medial femoral  condyle was noted.  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  There was a medial facet arthritis, which was resected using  an oscillating saw.  The lateral compartment was spared.  The trochlea was  also spared except for that part of the medial femoral condyle which  demonstrated significant wear.  The leg was taken out into full extension  and a tide mark was marked.  At this  time the ACL was found to be intact.  The tibial cutting jig was applied perpendicular to the mechanical axis of  the tibia.  A low point was established on the tibia and the 6 mm cut was  planned.  With retractors in position, the sagittal cut was first made.  The  axial cut was then made with the oscillating saw.  The bone was removed in  one piece and sized to a size large.  At this time the spacer was placed and  found to be somewhat tight.  The cut was revised to an 8 mm cut.  This gave  a better fit of the spacer in both full extension and flexion.  It should be  noted that the initial sagittal cut was aimed toward the hip joint, which  had been marked with preoperative fluoroscopy and draped with an EKG pad  under the drapes.  Following tibial resection, the femur was prepared.  The  femoral sizing guide was placed.  The patient was placed at the superior  aspect of the tide mark.  There was approximately two-thirds of the condyle  medial to the jig, one-third of the condyle was lateral to the jig.  The jig  itself was also aimed toward the hip.  Guide pins were used to hold the jig  in position, and the end cuts were made.  The router jig was then placed and   the router drill bit was then used to carve out the femoral cut.  The  posterior femoral cut was also made at this time.  After routing, the  femoral condyle jig was removed and the bone island was also removed.  Trial  components were then placed.  A large femur and large tibia gave a good fit.  A tongue blade would fit with moderate force in between the trial implants  in both full extension and flexion.  Alignment of the leg was overall  unchanged.  At this time the incision was thoroughly irrigated.  The keel  cut for the tibia was then performed.  The actual tibial component was then  placed in position and found to have a good fit circumferentially and was  flush against the sagittal cut on the tibia.  At this time cement was  applied to the femur and tibia.  A moist Ray-Tec was placed as a cement dam  posteriorly.  True components were placed, and cement was allowed to harden.  The leg was held in 45 degrees of flexion with varus stress while cement  hardening occurred.  After cement hardening occurred the sponge was removed.  The knee was taken through a range of motion and found to have good range of  motion with good stability of the components.  The incision was irrigated  and then closed over a pain pump using interrupted #1 Vicryl suture to  reapproximate the median parapatellar arthrotomy.  The tourniquet was  released and bleeding points encountered were controlled using  electrocautery prior to closure.  The skin was then closed using  interrupted, inverted 2-0 Vicryl and running 3-0 pull-out Prolene and skin  staples.  The patient was placed in a knee immobilizer, tolerated the  procedure well without immediate complications.  Burnard Bunting, M.D.    GSD/MEDQ  D:  12/07/2002  T:  12/07/2002  Job:  [id number]

## 2011-04-18 ENCOUNTER — Other Ambulatory Visit: Payer: Self-pay | Admitting: Family Medicine

## 2011-06-28 ENCOUNTER — Other Ambulatory Visit: Payer: Self-pay | Admitting: Family Medicine

## 2011-06-30 MED ORDER — SILDENAFIL CITRATE 50 MG PO TABS: 50.0000 mg | ORAL_TABLET | ORAL | Status: DC | PRN

## 2011-06-30 NOTE — Telephone Encounter (Signed)
Please refill Viagra at [facility]----[place]. Thanks.

## 2011-07-31 LAB — POCT I-STAT, CHEM 8
Calcium, Ion: 1.22
Chloride: 96
Creatinine, Ser: 1.2
Glucose, Bld: 101 — ABNORMAL HIGH
TCO2: 33

## 2011-08-04 LAB — DIFFERENTIAL
Eosinophils Absolute: 0.4
Eosinophils Relative: 4
Lymphs Abs: 1.5
Monocytes Relative: 9
Neutro Abs: 6.2

## 2011-08-04 LAB — CBC
HCT: 27.2 — ABNORMAL LOW
HCT: 41.3
Hemoglobin: 10.1 — ABNORMAL LOW
Hemoglobin: 9.3 — ABNORMAL LOW
MCHC: 33.7
MCHC: 34.3
MCHC: 35.4
MCV: 88.3
Platelets: 227
Platelets: 238
RBC: 3.45 — ABNORMAL LOW
RDW: 13.4
RDW: 13.8
WBC: 11.5 — ABNORMAL HIGH
WBC: 14.1 — ABNORMAL HIGH
WBC: 8.8

## 2011-08-04 LAB — URINALYSIS, ROUTINE W REFLEX MICROSCOPIC
Glucose, UA: NEGATIVE
Ketones, ur: NEGATIVE
Nitrite: NEGATIVE

## 2011-08-04 LAB — COMPREHENSIVE METABOLIC PANEL
AST: 25
Albumin: 4.1
BUN: 9
CO2: 28
Calcium: 9.3
GFR calc non Af Amer: >60
Glucose, Bld: 107 — ABNORMAL HIGH

## 2011-08-04 LAB — URINE CULTURE: Culture: NO GROWTH

## 2011-08-04 LAB — BASIC METABOLIC PANEL
Chloride: 100
Creatinine, Ser: 1.23
GFR calc Af Amer: >60
GFR calc non Af Amer: >60
Potassium: 4.1
Sodium: 134 — ABNORMAL LOW
Sodium: 134 — ABNORMAL LOW

## 2011-08-04 LAB — GRAM STAIN

## 2011-08-04 LAB — PROTIME-INR
INR: 0.9
INR: 1.1
INR: 2.2 — ABNORMAL HIGH
INR: 2.5 — ABNORMAL HIGH
Prothrombin Time: 12.3
Prothrombin Time: 13.9
Prothrombin Time: 25.1 — ABNORMAL HIGH
Prothrombin Time: 27.8 — ABNORMAL HIGH

## 2011-08-04 LAB — ANAEROBIC CULTURE

## 2011-08-04 LAB — BODY FLUID CULTURE: Culture: NO GROWTH

## 2011-08-04 LAB — TISSUE CULTURE

## 2012-01-24 ENCOUNTER — Ambulatory Visit (INDEPENDENT_AMBULATORY_CARE_PROVIDER_SITE_OTHER): Payer: BC Managed Care – PPO | Admitting: Family Medicine

## 2012-01-24 ENCOUNTER — Ambulatory Visit (INDEPENDENT_AMBULATORY_CARE_PROVIDER_SITE_OTHER)
Admission: RE | Admit: 2012-01-24 | Discharge: 2012-01-24 | Disposition: A | Discharge status: Home / Self Care | Payer: BC Managed Care – PPO | Source: Ambulatory Visit | Attending: Family Medicine | Admitting: Family Medicine

## 2012-01-24 ENCOUNTER — Encounter: Payer: Self-pay | Admitting: Family Medicine

## 2012-01-24 VITALS — BP 140/90 | Temp 98.1°F | Wt 255.0 lb

## 2012-01-24 DIAGNOSIS — R1031 Right lower quadrant pain: Secondary | ICD-10-CM

## 2012-01-24 MED ORDER — IOHEXOL 300 MG/ML  SOLN
100.0000 mL | Freq: Once | INTRAMUSCULAR | Status: AC | PRN
  Administered 2012-01-24: 100 mL via INTRAVENOUS

## 2012-01-24 NOTE — Patient Instructions (Signed)
We will get you set up for a CT scan of your abdomen today and have them call me the report to [phone]  In the meantime only sips of clear liquids

## 2012-01-24 NOTE — Progress Notes (Signed)
  Subjective:    Patient ID: Jacob Quinn, male    DOB: 06/26/1955, 56 y.o.   MRN: [id number]  HPI Jacob Quinn is a 56 year old male single nonsmoker who comes in today for evaluation of abdominal pain  He states that on Sunday night he developed the sudden onset of severe right lower quadrant abdominal pain. Since that time the pain has been intermittent it comes and goes. He feels like it's sharp like a knife last for few seconds and goes away he's had no fever nausea vomiting diarrhea urinary tract symptoms. No previous history of abdominal pain  Medications unchanged   Review of Systems    general and GI review of systems otherwise negative Objective:   Physical Exam  Well-developed well-nourished male in no acute distress examination the abdomen shows the abdomen is soft the bowel sounds are normal there is point tenderness right lower quadrant with rebound genitalia normal rectal normal prostate normal stool guaiac negative      Assessment & Plan:  Right lower quadrant pain question atypical appendicitis plan CT scan of the abdomen today

## 2012-02-28 ENCOUNTER — Encounter: Payer: Self-pay | Admitting: Gastroenterology

## 2012-03-20 ENCOUNTER — Ambulatory Visit: Payer: BC Managed Care – PPO | Admitting: Gastroenterology

## 2012-03-22 ENCOUNTER — Telehealth: Payer: Self-pay | Admitting: *Deleted

## 2012-03-22 MED ORDER — METRONIDAZOLE 500 MG PO TABS: 500.0000 mg | ORAL_TABLET | Freq: Three times a day (TID) | ORAL | Status: AC

## 2012-03-22 NOTE — Telephone Encounter (Signed)
Metronidazole 500 mg #21  one 3 times daily;  Notify patient that he should not drink alcohol while taking this medication

## 2012-03-22 NOTE — Telephone Encounter (Signed)
Patient is calling because girlfriend has been diagnosed with trichomonas and he would like to know if something can be called in for him?

## 2012-03-22 NOTE — Telephone Encounter (Signed)
Rx sent to pharmacy. Spoke with patient and he states that he does not drink alcohol

## 2012-04-03 ENCOUNTER — Telehealth: Payer: Self-pay | Admitting: Gastroenterology

## 2012-04-03 NOTE — Telephone Encounter (Signed)
Message copied by Arna Snipe on Wed Apr 03, 2012  8:24 AM ------      Message from: Donata Duff      Created: Wed Mar 20, 2012  3:45 PM       Do not bill

## 2012-04-19 ENCOUNTER — Encounter: Payer: Self-pay | Admitting: Gastroenterology

## 2012-04-19 ENCOUNTER — Ambulatory Visit (INDEPENDENT_AMBULATORY_CARE_PROVIDER_SITE_OTHER): Payer: BC Managed Care – PPO | Admitting: Gastroenterology

## 2012-04-19 VITALS — BP 140/90 | HR 72 | Ht 73.0 in | Wt 259.8 lb

## 2012-04-19 DIAGNOSIS — R109 Unspecified abdominal pain: Secondary | ICD-10-CM

## 2012-04-19 DIAGNOSIS — Z1211 Encounter for screening for malignant neoplasm of colon: Secondary | ICD-10-CM

## 2012-04-19 DIAGNOSIS — R1013 Epigastric pain: Secondary | ICD-10-CM

## 2012-04-19 MED ORDER — MOVIPREP 100 G PO SOLR: 1.0000 | ORAL | Status: DC

## 2012-04-19 NOTE — Patient Instructions (Addendum)
You should change the way you are taking your antiacid medicine (prilosec) so that you are taking it 20-30 minutes prior to a decent meal as that is the way the pill is designed to work most effectively. You will be set up for an upper endoscopy at [facility] for epigastric pains. You will be set up for a colonoscopy for routine cancer screening at [facility], same time as EGD.

## 2012-04-19 NOTE — Progress Notes (Signed)
HPI: This is a  very pleasant 56 year old man whom I am meeting for the first time today.  CT scan of abdomen and pelvis with IV and oral contrast 2-3 months ago showed no clear explanation for his symptoms.  Had subacute abd pains, went to pcp. Lasted 3-4 days. Epigastric.  Nagging pain.  No associated nausea.  No radiating.  No fevers or chills.  Constant, colicy during that time.  Crampy like feeling.  HE was not constipated around.   No problems with his bowels,  No bleeding. No signficant constipation or dirrhea.  Takes ibuprofen about daily, usually 2 to 3 of the 800mg  pills.  Takes prilosoec for periodic gerd, had pyrososis about daily.  Takes prilosec prn only.  No dypshagia.   He has never had colon cancer screening that he is aware of    Review of systems: Pertinent positive and negative review of systems were noted in the above HPI section. Complete review of systems was performed and was otherwise normal.    Past Medical History  Diagnosis Date  . Rheumatic fever   . Hypertension   . OA (osteoarthritis)   . Encephalitis, viral   . GERD (gastroesophageal reflux disease)   . Syncope     Past Surgical History  Procedure Date  . Replacement total knee     left    Current Outpatient Prescriptions  Medication Sig Dispense Refill  . ibuprofen (ADVIL,MOTRIN) 800 MG tablet       . lisinopril (PRINIVIL,ZESTRIL) 40 MG tablet TAKE 1 & 1/2 TABLETS EVERY MORNING  150 tablet  2  . Omeprazole 20 MG TBEC Take by mouth.        . sildenafil (VIAGRA) 50 MG tablet Take 1 tablet (50 mg total) by mouth as needed for erectile dysfunction.  6 tablet  11    Allergies as of 04/19/2012 - Review Complete 04/19/2012  Allergen Reaction Noted  . Penicillins  11/22/2007    Family History  Problem Relation Age of Onset  . Hypertension Mother   . Heart disease Mother   . Prostate cancer Father   . Depression Brother   . Hypertension Brother   . Hypertension Brother     History    Social History  . Marital Status: Single    Spouse Name: N/A    Number of Children: 3  . Years of Education: N/A   Occupational History  . [employer]iles     [employer]   Social History Main Topics  . Smoking status: Never Smoker   . Smokeless tobacco: Never Used  . Alcohol Use: No  . Drug Use: No  . Sexually Active: Not on file   Other Topics Concern  . Not on file   Social History Narrative  . No narrative on file       Physical Exam: BP 140/90  Pulse 72  Ht 6\' 1"  (1.854 m)  Wt 259 lb 12.8 oz (117.845 kg)  BMI 34.28 kg/m2 Constitutional: generally well-appearing Psychiatric: alert and oriented x3 Eyes: extraocular movements intact Mouth: oral pharynx moist, no lesions Neck: supple no lymphadenopathy Cardiovascular: heart regular rate and rhythm Lungs: clear to auscultation bilaterally Abdomen: soft, nontender, nondistended, no obvious ascites, no peritoneal signs, normal bowel sounds Extremities: no lower extremity edema bilaterally Skin: no lesions on visible extremities    Assessment and plan: 56 y.o. male with   intermittent epigastric pains, high daily NSAID use, intermittent GERD, routine risk for colon cancer  first I think it  is very possible that the epigastric discomfort he had 1-2 months ago are related to higher dosage NSAIDs which he takes every day. He has bad knees. I recommended he start taking his proton pump inhibitor on a daily schedule basis rather than just every once in a while. This will help with his chronic GERD better, he can also decrease the chance of NSAID related damage to his stomach. I think we should proceed with EGD. This is to investigate his epigastric discomfort further rule out other causes such as neoplasm, perhaps he has an ulcer. H. pylori infection. At the same time we will do colon cancer screening with a full colonoscopy.

## 2012-05-01 ENCOUNTER — Encounter: Payer: BC Managed Care – PPO | Admitting: Gastroenterology

## 2012-05-21 ENCOUNTER — Ambulatory Visit (AMBULATORY_SURGERY_CENTER): Payer: BC Managed Care – PPO | Admitting: Gastroenterology

## 2012-05-21 ENCOUNTER — Encounter: Payer: Self-pay | Admitting: Gastroenterology

## 2012-05-21 VITALS — BP 139/103 | HR 63 | Temp 97.7°F | Resp 16 | Ht 73.0 in | Wt 259.0 lb

## 2012-05-21 DIAGNOSIS — R1013 Epigastric pain: Secondary | ICD-10-CM

## 2012-05-21 DIAGNOSIS — D126 Benign neoplasm of colon, unspecified: Secondary | ICD-10-CM

## 2012-05-21 DIAGNOSIS — Z1211 Encounter for screening for malignant neoplasm of colon: Secondary | ICD-10-CM

## 2012-05-21 DIAGNOSIS — R109 Unspecified abdominal pain: Secondary | ICD-10-CM

## 2012-05-21 HISTORY — PX: COLONOSCOPY: SHX174

## 2012-05-21 MED ORDER — SODIUM CHLORIDE 0.9 % IV SOLN: 500.0000 mL | INTRAVENOUS | Status: DC

## 2012-05-21 NOTE — Patient Instructions (Addendum)
YOU HAD AN ENDOSCOPIC PROCEDURE TODAY AT THE [facility]: Refer to the procedure report that was given to you for any specific questions about what was found during the examination.  If the procedure report does not answer your questions, please call your gastroenterologist to clarify.  If you requested that your care partner not be given the details of your procedure findings, then the procedure report has been included in a sealed envelope for you to review at your convenience later.  YOU SHOULD EXPECT: Some feelings of bloating in the abdomen. Passage of more gas than usual.  Walking can help get rid of the air that was put into your GI tract during the procedure and reduce the bloating. If you had a lower endoscopy (such as a colonoscopy or flexible sigmoidoscopy) you may notice spotting of blood in your stool or on the toilet paper. If you underwent a bowel prep for your procedure, then you may not have a normal bowel movement for a few days.  DIET: Your first meal following the procedure should be a light meal and then it is ok to progress to your normal diet.  A half-sandwich or bowl of soup is an example of a good first meal.  Heavy or fried foods are harder to digest and may make you feel nauseous or bloated.  Likewise meals heavy in dairy and vegetables can cause extra gas to form and this can also increase the bloating.  Drink plenty of fluids but you should avoid alcoholic beverages for 24 hours.  ACTIVITY: Your care partner should take you home directly after the procedure.  You should plan to take it easy, moving slowly for the rest of the day.  You can resume normal activity the day after the procedure however you should NOT DRIVE or use heavy machinery for 24 hours (because of the sedation medicines used during the test).    SYMPTOMS TO REPORT IMMEDIATELY: A gastroenterologist can be reached at any hour.  During normal business hours, 8:30 AM to 5:00 PM Monday through Friday,  call [phone].  After hours and on weekends, please call the GI answering service at [phone] who will take a message and have the physician on call contact you.   Following lower endoscopy (colonoscopy or flexible sigmoidoscopy):  Excessive amounts of blood in the stool  Significant tenderness or worsening of abdominal pains  Swelling of the abdomen that is new, acute  Fever of 100F or higher  Following upper endoscopy (EGD)  Vomiting of blood or coffee ground material  New chest pain or pain under the shoulder blades  Painful or persistently difficult swallowing  New shortness of breath  Fever of 100F or higher  Black, tarry-looking stools  FOLLOW UP: If any biopsies were taken you will be contacted by phone or by letter within the next 1-3 weeks.  Call your gastroenterologist if you have not heard about the biopsies in 3 weeks.  Our staff will call the home number listed on your records the next business day following your procedure to check on you and address any questions or concerns that you may have at that time regarding the information given to you following your procedure. This is a courtesy call and so if there is no answer at the home number and we have not heard from you through the emergency physician on call, we will assume that you have returned to your regular daily activities without incident.  SIGNATURES/CONFIDENTIALITY: You and/or your care   partner have signed paperwork which will be entered into your electronic medical record.  These signatures attest to the fact that that the information above on your After Visit Summary has been reviewed and is understood.  Full responsibility of the confidentiality of this discharge information lies with you and/or your care-partner.   Resume medications. Information given on polyps with discharge instructions. 

## 2012-05-21 NOTE — Progress Notes (Signed)
Patient did not experience any of the following events: a burn prior to discharge; a fall within the facility; wrong site/side/patient/procedure/implant event; or a hospital transfer or hospital admission upon discharge from the facility. (G8907) Patient did not have preoperative order for IV antibiotic SSI prophylaxis. (G8918)  

## 2012-05-21 NOTE — Progress Notes (Signed)
Pt. Has cell phone in bag under stretcher.

## 2012-05-21 NOTE — Op Note (Signed)
[facility] [address] [place], [place]  [address]  COLONOSCOPY PROCEDURE REPORT  PATIENT:  Jacob, Quinn  MR#:  [id number] BIRTHDATE:  06/26/1955, 56 yrs. old  GENDER:  male ENDOSCOPIST:  Rachael Fee, MD REF. BY:  Tinnie Gens A. Tawanna Cooler, M.D. PROCEDURE DATE:  05/21/2012 PROCEDURE:  Colonoscopy with snare polypectomy ASA CLASS:  Class II INDICATIONS:  Routine Risk Screening MEDICATIONS:   Fentanyl 75 mcg IV, These medications were titrated to patient response per physician's verbal order, Versed 7 mg IV DESCRIPTION OF PROCEDURE:   After the risks benefits and alternatives of the procedure were thoroughly explained, informed consent was obtained.  Digital rectal exam was performed and revealed no rectal masses.   The LB [id number] endoscope was introduced through the anus and advanced to the cecum, which was identified by both the appendix and ileocecal valve, without limitations.  The quality of the prep was fair..  The instrument was then slowly withdrawn as the colon was fully examined.  <<PROCEDUREIMAGES>> FINDINGS:  Five polyps were removed, all were sent to pathology. Three of them were sessile, ranging in size from 3-49mm across, located in descending segment, removed with cold snare and all sent to pathology (jar 2). One was pedunculated, located in transverse colon, 1.5cm across, removed with snare/cautery and sent to patholgy (jar 1). The last was located in sigmoid, pedunculated, 1.1cm across, removed with snare/cautery and sent to pathology (jar 3) (see image3, image5, and image7).  This was otherwise a normal examination of the colon (see image8, image1, and image2).   Retroflexed views in the rectum revealed no abnormalities. COMPLICATIONS:  None  ENDOSCOPIC IMPRESSION: 1) Five polyps, all were removed and all were sent to pathology  2) Otherwise normal examination  RECOMMENDATIONS: 1) If the polyp(s) removed today are proven to be  adenomatous (pre-cancerous) polyps, you will need a colonoscopy in 3 years. Otherwise you should continue to follow colorectal cancer screening guidelines for "routine risk" patients with a colonoscopy in 10 years. You will receive a letter within 1-2 weeks with the results of your biopsy as well as final recommendations. Please call my office if you have not received a letter after 3 weeks.  ______________________________ Rachael Fee, MD  n. eSIGNED:   Rachael Fee at 05/21/2012 03:34 PM  Pryor Montes, [phone]

## 2012-05-21 NOTE — Op Note (Signed)
[facility] [address] [place], [place]  [address]  ENDOSCOPY PROCEDURE REPORT  PATIENT:  [name], Jacob Quinn  MR#:  [id number] BIRTHDATE:  06/26/1955, 56 yrs. old  GENDER:  male ENDOSCOPIST:  Rachael Fee, MD PROCEDURE DATE:  05/21/2012 PROCEDURE:  EGD, diagnostic [phone] ASA CLASS:  Class II INDICATIONS:  epigastric pain MEDICATIONS: There was residual sedation effect present from prior procedure., These medications were titrated to patient response per physician's verbal order, Versed 3 mg IV TOPICAL ANESTHETIC:  Cetacaine Spray  DESCRIPTION OF PROCEDURE:   After the risks benefits and alternatives of the procedure were thoroughly explained, informed consent was obtained.  The LB GIF-H180 [id number] endoscope was introduced through the mouth and advanced to the second portion of the duodenum, without limitations.  The instrument was slowly withdrawn as the mucosa was fully examined. <<PROCEDUREIMAGES>> The upper, middle, and distal third of the esophagus were carefully inspected and no abnormalities were noted. The z-line was well seen at the GEJ. The endoscope was pushed into the fundus which was normal including a retroflexed view. The antrum,gastric body, first and second part of the duodenum were unremarkable (see image1, image2, image3, image4, image5, and image6). Retroflexed views revealed no abnormalities.    The scope was then withdrawn from the patient and the procedure completed. COMPLICATIONS:  None  ENDOSCOPIC IMPRESSION: 1) Normal EGD 2) You abdominal pain was likely from daily NSAIDs (ibuprofen)  RECOMMENDATIONS: Continue once daily antiacid medicine (omeprazole) for as long as you require daily NSAIDs such as ibuprofen.  ______________________________ Rachael Fee, MD  cc: Alonza Smoker, MD  n. eSIGNED:   Rachael Fee at 05/21/2012 03:44 PM  Pryor Montes, [phone]

## 2012-05-22 ENCOUNTER — Telehealth: Payer: Self-pay | Admitting: *Deleted

## 2012-05-22 NOTE — Telephone Encounter (Signed)
No answer, message left for the patient. 

## 2012-06-04 ENCOUNTER — Encounter: Payer: Self-pay | Admitting: Gastroenterology

## 2012-07-02 ENCOUNTER — Other Ambulatory Visit: Payer: Self-pay | Admitting: Family Medicine

## 2012-09-29 ENCOUNTER — Other Ambulatory Visit: Payer: Self-pay | Admitting: Family Medicine

## 2013-01-17 ENCOUNTER — Other Ambulatory Visit: Payer: Self-pay | Admitting: Family Medicine

## 2013-08-05 ENCOUNTER — Other Ambulatory Visit: Payer: Self-pay | Admitting: Family Medicine

## 2013-09-15 ENCOUNTER — Other Ambulatory Visit: Payer: Self-pay | Admitting: Family Medicine

## 2013-09-17 ENCOUNTER — Other Ambulatory Visit: Payer: Self-pay | Admitting: Family Medicine

## 2013-09-18 ENCOUNTER — Ambulatory Visit: Payer: BC Managed Care – PPO | Admitting: Family Medicine

## 2013-09-18 MED ORDER — SILDENAFIL CITRATE 50 MG PO TABS: ORAL_TABLET | ORAL | Status: DC

## 2013-09-18 NOTE — Addendum Note (Signed)
Addended by: Kern Reap B on: 09/18/2013 05:22 PM   Modules accepted: Orders

## 2013-09-18 NOTE — Telephone Encounter (Signed)
Pt has appt on 12.11.14.

## 2013-10-02 ENCOUNTER — Other Ambulatory Visit: Payer: Self-pay | Admitting: Family Medicine

## 2013-10-09 ENCOUNTER — Encounter: Payer: Self-pay | Admitting: Family Medicine

## 2013-10-09 ENCOUNTER — Ambulatory Visit (INDEPENDENT_AMBULATORY_CARE_PROVIDER_SITE_OTHER): Payer: BC Managed Care – PPO | Admitting: Family Medicine

## 2013-10-09 VITALS — BP 160/90 | Temp 97.6°F | Wt 261.0 lb

## 2013-10-09 DIAGNOSIS — I10 Essential (primary) hypertension: Secondary | ICD-10-CM

## 2013-10-09 DIAGNOSIS — Z23 Encounter for immunization: Secondary | ICD-10-CM

## 2013-10-09 DIAGNOSIS — F528 Other sexual dysfunction not due to a substance or known physiological condition: Secondary | ICD-10-CM

## 2013-10-09 MED ORDER — SILDENAFIL CITRATE 100 MG PO TABS: 50.0000 mg | ORAL_TABLET | Freq: Every day | ORAL | Status: DC | PRN

## 2013-10-09 MED ORDER — LISINOPRIL 40 MG PO TABS: ORAL_TABLET | ORAL | Status: DC

## 2013-10-09 NOTE — Progress Notes (Signed)
   Subjective:    Patient ID: Jacob Quinn, male    DOB: 06/26/1955, 57 y.o.   MRN: [id number]  HPI Jacob Quinn is a 57 year old married male nonsmoker who works at [employer] who comes in today for followup of hypertension and erectile dysfunction  He's been on lisinopril 40 mg daily his blood pressure is 160/90. We had increased his lisinopril couple months ago but he didn't increase the dose.  He uses Viagra 25 mg when necessary for erectile dysfunction it doesn't seem to be working   Review of Systems    review of systems otherwise negative Objective:   Physical Exam  Well-developed well-nourished male no acute distress vital signs stable he is afebrile BP 160/90 right arm sitting position pulse 70 and regular      Assessment & Plan:  Hypertension not at goal increase lisinopril to 60 mg daily followup in one month  Erectile dysfunction Viagra 100 mg when necessary

## 2013-10-09 NOTE — Patient Instructions (Signed)
Lisinopril 40 mg,,,,,,,,,,,, take 1-1/2 tablets daily in the morning  Viagra 100 mg,,,,,,,,, one half tab when necessary  [email or web address]

## 2013-10-09 NOTE — Progress Notes (Signed)
Pre visit review using our clinic review tool, if applicable. No additional management support is needed unless otherwise documented below in the visit note. 

## 2013-10-22 ENCOUNTER — Other Ambulatory Visit: Payer: Self-pay | Admitting: Family Medicine

## 2013-11-11 ENCOUNTER — Ambulatory Visit (INDEPENDENT_AMBULATORY_CARE_PROVIDER_SITE_OTHER): Payer: BC Managed Care – PPO | Admitting: Family Medicine

## 2013-11-11 ENCOUNTER — Encounter: Payer: Self-pay | Admitting: Family Medicine

## 2013-11-11 VITALS — BP 140/98 | Temp 98.0°F | Wt 262.0 lb

## 2013-11-11 DIAGNOSIS — I10 Essential (primary) hypertension: Secondary | ICD-10-CM

## 2013-11-11 DIAGNOSIS — F528 Other sexual dysfunction not due to a substance or known physiological condition: Secondary | ICD-10-CM

## 2013-11-11 MED ORDER — SILDENAFIL CITRATE 100 MG PO TABS: 50.0000 mg | ORAL_TABLET | Freq: Every day | ORAL | Status: DC | PRN

## 2013-11-11 MED ORDER — LISINOPRIL 40 MG PO TABS: ORAL_TABLET | ORAL | Status: DC

## 2013-11-11 NOTE — Progress Notes (Signed)
   Subjective:    Patient ID: Jacob Quinn, male    DOB: 06/26/1955, 58 y.o.   MRN: [id number]  HPI Jacob Quinn is a 58 year old male who comes in today for followup of hypertension and erectile dysfunction  We increased his lisinopril to 60 mg daily BP today 140/98  He has a history of rectal dysfunction like to refill in the Viagra.   Review of Systems    review of systems otherwise negative Objective:   Physical Exam  Well-developed well nourished male no acute distress vital signs stable he is afebrile BP right arm sitting position 140/98      Assessment & Plan:  Hypertension not at goal increase lisinopril to 80 mg day followup in one month  Erectile dysfunction refill Viagra

## 2013-11-11 NOTE — Patient Instructions (Signed)
Viagra 100 mg,,,,,,,,,,, one half tab when necessary............ [email or web address]  Lisinopril 40 mg,,,,,,,,, 2 tabs daily in the morning  Return in one month for followup

## 2013-11-11 NOTE — Progress Notes (Signed)
Pre visit review using our clinic review tool, if applicable. No additional management support is needed unless otherwise documented below in the visit note. 

## 2013-11-24 ENCOUNTER — Other Ambulatory Visit: Payer: Self-pay | Admitting: Family Medicine

## 2013-12-03 ENCOUNTER — Telehealth: Payer: Self-pay | Admitting: Family Medicine

## 2013-12-03 NOTE — Telephone Encounter (Signed)
Relevant patient education mailed to patient.  

## 2013-12-15 ENCOUNTER — Ambulatory Visit: Payer: BC Managed Care – PPO | Admitting: Family Medicine

## 2014-01-01 ENCOUNTER — Ambulatory Visit: Payer: BC Managed Care – PPO | Admitting: Family Medicine

## 2014-01-12 ENCOUNTER — Encounter: Payer: Self-pay | Admitting: Family Medicine

## 2014-01-12 ENCOUNTER — Ambulatory Visit (INDEPENDENT_AMBULATORY_CARE_PROVIDER_SITE_OTHER): Payer: BC Managed Care – PPO | Admitting: Family Medicine

## 2014-01-12 VITALS — BP 120/80 | Temp 98.2°F | Wt 250.0 lb

## 2014-01-12 DIAGNOSIS — I10 Essential (primary) hypertension: Secondary | ICD-10-CM

## 2014-01-12 LAB — POCT URINALYSIS DIPSTICK
Bilirubin, UA: NEGATIVE
GLUCOSE UA: NEGATIVE
Ketones, UA: NEGATIVE
Leukocytes, UA: NEGATIVE
NITRITE UA: NEGATIVE
PROTEIN UA: NEGATIVE
RBC UA: NEGATIVE
Spec Grav, UA: 1.025
UROBILINOGEN UA: 0.2
pH, UA: 5

## 2014-01-12 NOTE — Patient Instructions (Addendum)
Lisinopril 40 mg....... 2 tabs daily in the morning for high blood pressure   Motrin.......Marland Kitchen maxed dose......... 400 mg twice daily with food and only take it for very short periods of time....... a couple days  Return for your annual physical exam............ lab work fasting one week prior  Labs today......Marland Kitchen we will call you the report in a couple days

## 2014-01-12 NOTE — Progress Notes (Signed)
   Subjective:    Patient ID: Jacob Quinn, male    DOB: 06/26/1955, 58 y.o.   MRN: [id number]  HPI Jacob Quinn is a 58 year old male who comes in today for followup of hypertension  We increased his lisinopril 80 mg daily his blood pressure now is 120/80. He's also taken 800 mg of Motrin once or twice daily. Advised to take no more than 400 mg twice daily when necessary very rarely only if he absolutely needs it    Review of Systems Negative    Objective:   Physical Exam  Well-developed well nourished male no acute distress vital signs stable he is afebrile BP 120/80      Assessment & Plan:  Hypertension at goal continue current therapy followup in 1 year

## 2014-01-13 ENCOUNTER — Telehealth: Payer: Self-pay | Admitting: Family Medicine

## 2014-01-13 LAB — HEPATIC FUNCTION PANEL
ALK PHOS: 80 U/L (ref 39–117)
ALT: 27 U/L (ref 0–53)
AST: 24 U/L (ref 0–37)
Albumin: 4.1 g/dL (ref 3.5–5.2)
BILIRUBIN TOTAL: 0.4 mg/dL (ref 0.3–1.2)
Bilirubin, Direct: 0.1 mg/dL (ref 0.0–0.3)
Total Protein: 7 g/dL (ref 6.0–8.3)

## 2014-01-13 LAB — CBC WITH DIFFERENTIAL/PLATELET
BASOS ABS: 0 10*3/uL (ref 0.0–0.1)
Basophils Relative: 0.5 % (ref 0.0–3.0)
EOS ABS: 0.3 10*3/uL (ref 0.0–0.7)
Eosinophils Relative: 2.8 % (ref 0.0–5.0)
HCT: 39.3 % (ref 39.0–52.0)
HEMOGLOBIN: 13 g/dL (ref 13.0–17.0)
LYMPHS PCT: 31.9 % (ref 12.0–46.0)
Lymphs Abs: 2.9 10*3/uL (ref 0.7–4.0)
MCHC: 33 g/dL (ref 30.0–36.0)
MCV: 88.2 fl (ref 78.0–100.0)
MONOS PCT: 3.5 % (ref 3.0–12.0)
Monocytes Absolute: 0.3 10*3/uL (ref 0.1–1.0)
NEUTROS ABS: 5.6 10*3/uL (ref 1.4–7.7)
Neutrophils Relative %: 61.3 % (ref 43.0–77.0)
PLATELETS: 297 10*3/uL (ref 150.0–400.0)
RBC: 4.46 Mil/uL (ref 4.22–5.81)
RDW: 14.1 % (ref 11.5–14.6)
WBC: 9.2 10*3/uL (ref 4.5–10.5)

## 2014-01-13 LAB — LIPID PANEL
CHOLESTEROL: 200 mg/dL (ref 0–200)
HDL: 39 mg/dL — AB (ref >39.00)
LDL Cholesterol: 120 mg/dL — ABNORMAL HIGH (ref 0–99)
TRIGLYCERIDES: 207 mg/dL — AB (ref 0.0–149.0)
Total CHOL/HDL Ratio: 5
VLDL: 41.4 mg/dL — AB (ref 0.0–40.0)

## 2014-01-13 LAB — BASIC METABOLIC PANEL
BUN: 12 mg/dL (ref 6–23)
CO2: 25 mEq/L (ref 19–32)
CREATININE: 1.2 mg/dL (ref 0.4–1.5)
Calcium: 9.5 mg/dL (ref 8.4–10.5)
Chloride: 101 mEq/L (ref 96–112)
GFR: 80.83 mL/min (ref >60.00)
GLUCOSE: 121 mg/dL — AB (ref 70–99)
Potassium: 3.8 mEq/L (ref 3.5–5.1)
SODIUM: 136 meq/L (ref 135–145)

## 2014-01-13 LAB — PSA: PSA: 1.34 ng/mL (ref 0.10–4.00)

## 2014-01-13 LAB — TSH: TSH: 1.33 u[IU]/mL (ref 0.35–5.50)

## 2014-01-13 NOTE — Telephone Encounter (Signed)
Relevant patient education mailed to patient.  

## 2014-03-17 ENCOUNTER — Other Ambulatory Visit (INDEPENDENT_AMBULATORY_CARE_PROVIDER_SITE_OTHER): Payer: BC Managed Care – PPO

## 2014-03-17 DIAGNOSIS — Z0000 Encounter for general adult medical examination without abnormal findings: Secondary | ICD-10-CM

## 2014-03-17 LAB — HEPATIC FUNCTION PANEL
ALK PHOS: 67 U/L (ref 39–117)
ALT: 19 U/L (ref 0–53)
AST: 21 U/L (ref 0–37)
Albumin: 4 g/dL (ref 3.5–5.2)
BILIRUBIN TOTAL: 1 mg/dL (ref 0.2–1.2)
Bilirubin, Direct: 0.2 mg/dL (ref 0.0–0.3)
Total Protein: 7 g/dL (ref 6.0–8.3)

## 2014-03-17 LAB — LIPID PANEL
Cholesterol: 208 mg/dL — ABNORMAL HIGH (ref 0–200)
HDL: 45.2 mg/dL (ref >39.00)
LDL Cholesterol: 146 mg/dL — ABNORMAL HIGH (ref 0–99)
TRIGLYCERIDES: 86 mg/dL (ref 0.0–149.0)
Total CHOL/HDL Ratio: 5
VLDL: 17.2 mg/dL (ref 0.0–40.0)

## 2014-03-17 LAB — CBC WITH DIFFERENTIAL/PLATELET
BASOS ABS: 0 10*3/uL (ref 0.0–0.1)
Basophils Relative: 0.6 % (ref 0.0–3.0)
Eosinophils Absolute: 0.2 10*3/uL (ref 0.0–0.7)
Eosinophils Relative: 2.5 % (ref 0.0–5.0)
HCT: 41.1 % (ref 39.0–52.0)
Hemoglobin: 13.5 g/dL (ref 13.0–17.0)
LYMPHS PCT: 31.6 % (ref 12.0–46.0)
Lymphs Abs: 2.3 10*3/uL (ref 0.7–4.0)
MCHC: 32.8 g/dL (ref 30.0–36.0)
MCV: 88.4 fl (ref 78.0–100.0)
MONOS PCT: 6.1 % (ref 3.0–12.0)
Monocytes Absolute: 0.5 10*3/uL (ref 0.1–1.0)
NEUTROS PCT: 59.2 % (ref 43.0–77.0)
Neutro Abs: 4.4 10*3/uL (ref 1.4–7.7)
PLATELETS: 281 10*3/uL (ref 150.0–400.0)
RBC: 4.65 Mil/uL (ref 4.22–5.81)
RDW: 14.5 % (ref 11.5–15.5)
WBC: 7.4 10*3/uL (ref 4.0–10.5)

## 2014-03-17 LAB — BASIC METABOLIC PANEL
BUN: 15 mg/dL (ref 6–23)
CALCIUM: 9.4 mg/dL (ref 8.4–10.5)
CO2: 28 meq/L (ref 19–32)
CREATININE: 1 mg/dL (ref 0.4–1.5)
Chloride: 103 mEq/L (ref 96–112)
GFR: 97.79 mL/min (ref >60.00)
GLUCOSE: 86 mg/dL (ref 70–99)
Potassium: 4.1 mEq/L (ref 3.5–5.1)
Sodium: 138 mEq/L (ref 135–145)

## 2014-03-17 LAB — POCT URINALYSIS DIPSTICK
Bilirubin, UA: NEGATIVE
Blood, UA: NEGATIVE
GLUCOSE UA: NEGATIVE
KETONES UA: NEGATIVE
LEUKOCYTES UA: NEGATIVE
Nitrite, UA: NEGATIVE
Spec Grav, UA: 1.02
UROBILINOGEN UA: 0.2
pH, UA: 6.5

## 2014-03-17 LAB — PSA: PSA: 1.46 ng/mL (ref 0.10–4.00)

## 2014-03-17 LAB — TSH: TSH: 1.21 u[IU]/mL (ref 0.35–4.50)

## 2014-03-24 ENCOUNTER — Ambulatory Visit (INDEPENDENT_AMBULATORY_CARE_PROVIDER_SITE_OTHER): Payer: BC Managed Care – PPO | Admitting: Family Medicine

## 2014-03-24 ENCOUNTER — Encounter: Payer: Self-pay | Admitting: Family Medicine

## 2014-03-24 VITALS — BP 160/98 | Temp 98.4°F | Ht 72.5 in | Wt 245.0 lb

## 2014-03-24 DIAGNOSIS — K219 Gastro-esophageal reflux disease without esophagitis: Secondary | ICD-10-CM

## 2014-03-24 DIAGNOSIS — R351 Nocturia: Secondary | ICD-10-CM

## 2014-03-24 DIAGNOSIS — F528 Other sexual dysfunction not due to a substance or known physiological condition: Secondary | ICD-10-CM

## 2014-03-24 DIAGNOSIS — Z23 Encounter for immunization: Secondary | ICD-10-CM

## 2014-03-24 DIAGNOSIS — I10 Essential (primary) hypertension: Secondary | ICD-10-CM

## 2014-03-24 MED ORDER — SILDENAFIL CITRATE 100 MG PO TABS
50.0000 mg | ORAL_TABLET | Freq: Every day | ORAL | Status: DC | PRN
Start: 2014-03-24 — End: 2014-04-14

## 2014-03-24 MED ORDER — LISINOPRIL 40 MG PO TABS: ORAL_TABLET | ORAL | Status: DC

## 2014-03-24 NOTE — Patient Instructions (Signed)
Lisinopril 40 mg........ 2 tablets daily  Check your blood pressure daily in the morning for 4 weeks.........Marland Kitchen blood pressure goal 135/85 or less....... if after 4 weeks your blood pressures not at goal call and make an appointment to see me for followup  Viagra 100 mg............ [email or web address]

## 2014-03-24 NOTE — Progress Notes (Signed)
Pre visit review using our clinic review tool, if applicable. No additional management support is needed unless otherwise documented below in the visit note. 

## 2014-03-24 NOTE — Progress Notes (Signed)
   Subjective:    Patient ID: Jacob Quinn, male    DOB: 06/26/1955, 58 y.o.   MRN: [id number]  HPI  Eduard is a 58 year old male who comes in today for routine physical exam because of a history of hypertension and erectile dysfunction  He gets routine eye care, dental care, colonoscopy and GI normal,  Vaccinations up-to-date  He says he feels well and has no complaints. He is due to get an eye exam   Review of Systems  Constitutional: Negative.   HENT: Negative.   Eyes: Negative.   Respiratory: Negative.   Cardiovascular: Negative.   Gastrointestinal: Negative.   Genitourinary: Negative.   Musculoskeletal: Negative.   Skin: Negative.   Neurological: Negative.   Psychiatric/Behavioral: Negative.        Objective:   Physical Exam  Nursing note and vitals reviewed. Constitutional: He is oriented to person, place, and time. He appears well-developed and well-nourished.  HENT:  Head: Normocephalic and atraumatic.  Right Ear: External ear normal.  Left Ear: External ear normal.  Nose: Nose normal.  Mouth/Throat: Oropharynx is clear and moist.  Eyes: Conjunctivae and EOM are normal. Pupils are equal, round, and reactive to light.  Neck: Normal range of motion. Neck supple. No JVD present. No tracheal deviation present. No thyromegaly present.  Cardiovascular: Normal rate, regular rhythm, normal heart sounds and intact distal pulses.  Exam reveals no gallop and no friction rub.   No murmur heard. Pulmonary/Chest: Effort normal and breath sounds normal. No stridor. No respiratory distress. He has no wheezes. He has no rales. He exhibits no tenderness.  Abdominal: Soft. Bowel sounds are normal. He exhibits no distension and no mass. There is no tenderness. There is no rebound and no guarding.  Genitourinary: Rectum normal, prostate normal and penis normal. Guaiac negative stool. No penile tenderness.  Musculoskeletal: Normal range of motion. He exhibits no edema and no  tenderness.  Lymphadenopathy:    He has no cervical adenopathy.  Neurological: He is alert and oriented to person, place, and time. He has normal reflexes. No cranial nerve deficit. He exhibits normal muscle tone.  Skin: Skin is warm and dry. No rash noted. No erythema. No pallor.  Psychiatric: He has a normal mood and affect. His behavior is normal. Judgment and thought content normal.   Scars right and left knee from previous knee replacements      Assessment & Plan:  Healthy male  Hypertension continue lisinopril  Erectile dysfunction continue Viagra  Status post bilateral knee replacements

## 2014-04-14 ENCOUNTER — Other Ambulatory Visit: Payer: Self-pay | Admitting: *Deleted

## 2014-04-14 DIAGNOSIS — F528 Other sexual dysfunction not due to a substance or known physiological condition: Secondary | ICD-10-CM

## 2014-04-14 MED ORDER — SILDENAFIL CITRATE 100 MG PO TABS: 50.0000 mg | ORAL_TABLET | Freq: Every day | ORAL | Status: DC | PRN

## 2014-06-18 ENCOUNTER — Telehealth: Payer: Self-pay | Admitting: Family Medicine

## 2014-06-18 DIAGNOSIS — I10 Essential (primary) hypertension: Secondary | ICD-10-CM

## 2014-06-18 MED ORDER — LISINOPRIL 40 MG PO TABS: ORAL_TABLET | ORAL | Status: DC

## 2014-06-18 NOTE — Telephone Encounter (Signed)
[facility] is requesting 90 day re-fill on lisinopril (PRINIVIL,ZESTRIL) 40 MG tablet

## 2014-06-18 NOTE — Telephone Encounter (Signed)
Rx sent 

## 2014-11-18 ENCOUNTER — Other Ambulatory Visit (HOSPITAL_COMMUNITY): Payer: Self-pay | Admitting: Orthopedic Surgery

## 2014-11-18 DIAGNOSIS — M25561 Pain in right knee: Secondary | ICD-10-CM

## 2014-11-27 ENCOUNTER — Encounter (HOSPITAL_COMMUNITY)
Admission: RE | Admit: 2014-11-27 | Discharge: 2014-11-27 | Disposition: A | Discharge status: Home / Self Care | Payer: BLUE CROSS/BLUE SHIELD | Source: Ambulatory Visit | Attending: Orthopedic Surgery | Admitting: Orthopedic Surgery

## 2014-11-27 DIAGNOSIS — M25561 Pain in right knee: Secondary | ICD-10-CM | POA: Diagnosis present

## 2014-11-27 MED ORDER — TECHNETIUM TC 99M MEDRONATE IV KIT
25.0000 | PACK | Freq: Once | INTRAVENOUS | Status: AC | PRN
  Administered 2014-11-27: 25 via INTRAVENOUS

## 2015-01-28 ENCOUNTER — Ambulatory Visit (INDEPENDENT_AMBULATORY_CARE_PROVIDER_SITE_OTHER): Payer: BLUE CROSS/BLUE SHIELD | Admitting: Family Medicine

## 2015-01-28 ENCOUNTER — Encounter: Payer: Self-pay | Admitting: Family Medicine

## 2015-01-28 VITALS — BP 160/100 | HR 84 | Temp 100.8°F | Wt 234.0 lb

## 2015-01-28 DIAGNOSIS — J029 Acute pharyngitis, unspecified: Secondary | ICD-10-CM | POA: Diagnosis not present

## 2015-01-28 DIAGNOSIS — I10 Essential (primary) hypertension: Secondary | ICD-10-CM | POA: Diagnosis not present

## 2015-01-28 LAB — POCT RAPID STREP A (OFFICE): Rapid Strep A Screen: POSITIVE — AB

## 2015-01-28 MED ORDER — AZITHROMYCIN 250 MG PO TABS: ORAL_TABLET | ORAL | Status: DC

## 2015-01-28 NOTE — Patient Instructions (Signed)
BEFORE YOU LEAVE: -schedule follow up with Dr. Sherren Mocha in 2 weeks to recheck blood pressure  Take the antibiotic for the strep throat

## 2015-01-28 NOTE — Progress Notes (Signed)
Pre visit review using our clinic review tool, if applicable. No additional management support is needed unless otherwise documented below in the visit note. 

## 2015-01-28 NOTE — Progress Notes (Addendum)
HPI:  Sore throat: -started: 2 days ago -symptoms: sore throat, drainage in throat, PND -denies:fever, SOB, NVD, tooth pain -has tried: OTC cold medication -sick contacts/travel/risks: denies flu exposure, tick exposure or or Ebola risks, strep exposure to his knowledge -he has a penicillin allergy  HTN: -did not take his blood pressure medications today -denies: SOB, CP, HA  ROS: See pertinent positives and negatives per HPI.  Past Medical History  Diagnosis Date  . Rheumatic fever   . Hypertension   . OA (osteoarthritis)   . Encephalitis, viral   . GERD (gastroesophageal reflux disease)   . Syncope     Past Surgical History  Procedure Laterality Date  . Replacement total knee      left and right  . Tonsillectomy      Family History  Problem Relation Age of Onset  . Hypertension Mother   . Heart disease Mother   . Prostate cancer Father   . Depression Brother   . Hypertension Brother   . Hypertension Brother     History   Social History  . Marital Status: Single    Spouse Name: N/A  . Number of Children: 3  . Years of Education: N/A   Occupational History  . [employer]iles     [name]   Social History Main Topics  . Smoking status: Never Smoker   . Smokeless tobacco: Never Used  . Alcohol Use: No  . Drug Use: No  . Sexual Activity: Not on file   Other Topics Concern  . None   Social History Narrative     Current outpatient prescriptions:  .  ibuprofen (ADVIL,MOTRIN) 800 MG tablet, , Disp: , Rfl:  .  lisinopril (PRINIVIL,ZESTRIL) 40 MG tablet, TAKE 2 TABLETS EVERY MORNING, Disp: 200 tablet, Rfl: 3 .  Omeprazole 20 MG TBEC, Take by mouth.  , Disp: , Rfl:  .  sildenafil (VIAGRA) 100 MG tablet, Take 0.5-1 tablets (50-100 mg total) by mouth daily as needed for erectile dysfunction., Disp: 90 tablet, Rfl: 3 .  azithromycin (ZITHROMAX) 250 MG tablet, 2 tabs on day one followed by 1 tab daily for 4 days, Disp: 6 tablet, Rfl: 0  EXAM:  Filed  Vitals:   01/28/15 1637  BP: 160/100  Pulse: 84  Temp: 100.8 F (38.2 C)    Body mass index is 31.28 kg/(m^2).  GENERAL: vitals reviewed and listed above, alert, oriented, appears well hydrated and in no acute distress  HEENT: atraumatic, conjunttiva clear, no obvious abnormalities on inspection of external nose and ears, normal appearance of ear canals and TMs,  mild post oropharyngeal erythema with PND, 1+ tonsillar edema with exudate, no sinus TTP  NECK: no obvious masses on inspection  LUNGS: clear to auscultation bilaterally, no wheezes, rales or rhonchi, good air movement  CV: HRRR, no peripheral edema  MS: moves all extremities without noticeable abnormality  PSYCH: pleasant and cooperative, no obvious depression or anxiety  ASSESSMENT AND PLAN:  Discussed the following assessment and plan:  Sore throat - Plan: POC Rapid Strep A  Essential hypertension  -symptoms and exam findings c/w strep, rapid strep test + - opted for tx with azithromycin given pen allergy -advised taking BP medications daily and close follow up with PCP to recheck BP -of course, we advised to return or notify a doctor immediately if symptoms worsen or persist or new concerns arise.    Patient Instructions  BEFORE YOU LEAVE: -schedule follow up with Dr. Sherren Mocha in 2 weeks to  recheck blood pressure  Take the antibiotic for the strep throat     KIM, HANNAH R.

## 2015-01-30 ENCOUNTER — Observation Stay (HOSPITAL_COMMUNITY)
Admission: EM | Admit: 2015-01-30 | Discharge: 2015-01-31 | Disposition: A | Discharge status: Home / Self Care | Payer: BLUE CROSS/BLUE SHIELD | Attending: Internal Medicine | Admitting: Internal Medicine

## 2015-01-30 ENCOUNTER — Emergency Department (HOSPITAL_COMMUNITY): Payer: BLUE CROSS/BLUE SHIELD

## 2015-01-30 ENCOUNTER — Encounter (HOSPITAL_COMMUNITY): Payer: Self-pay | Admitting: *Deleted

## 2015-01-30 DIAGNOSIS — N179 Acute kidney failure, unspecified: Principal | ICD-10-CM | POA: Diagnosis present

## 2015-01-30 DIAGNOSIS — Z96653 Presence of artificial knee joint, bilateral: Secondary | ICD-10-CM | POA: Insufficient documentation

## 2015-01-30 DIAGNOSIS — Z8709 Personal history of other diseases of the respiratory system: Secondary | ICD-10-CM | POA: Diagnosis not present

## 2015-01-30 DIAGNOSIS — M1990 Unspecified osteoarthritis, unspecified site: Secondary | ICD-10-CM | POA: Insufficient documentation

## 2015-01-30 DIAGNOSIS — E860 Dehydration: Secondary | ICD-10-CM | POA: Insufficient documentation

## 2015-01-30 DIAGNOSIS — J029 Acute pharyngitis, unspecified: Secondary | ICD-10-CM

## 2015-01-30 DIAGNOSIS — K219 Gastro-esophageal reflux disease without esophagitis: Secondary | ICD-10-CM | POA: Insufficient documentation

## 2015-01-30 DIAGNOSIS — R5383 Other fatigue: Secondary | ICD-10-CM

## 2015-01-30 DIAGNOSIS — I10 Essential (primary) hypertension: Secondary | ICD-10-CM | POA: Insufficient documentation

## 2015-01-30 DIAGNOSIS — T464X5A Adverse effect of angiotensin-converting-enzyme inhibitors, initial encounter: Secondary | ICD-10-CM | POA: Insufficient documentation

## 2015-01-30 DIAGNOSIS — Z880 Allergy status to penicillin: Secondary | ICD-10-CM | POA: Insufficient documentation

## 2015-01-30 DIAGNOSIS — R531 Weakness: Secondary | ICD-10-CM | POA: Diagnosis present

## 2015-01-30 LAB — PROTIME-INR
INR: 1.1 (ref 0.00–1.49)
PROTHROMBIN TIME: 14.3 s (ref 11.6–15.2)

## 2015-01-30 LAB — CBC WITH DIFFERENTIAL/PLATELET
Basophils Absolute: 0 10*3/uL (ref 0.0–0.1)
Basophils Relative: 0 % (ref 0–1)
EOS ABS: 0.3 10*3/uL (ref 0.0–0.7)
Eosinophils Relative: 2 % (ref 0–5)
HEMATOCRIT: 42.6 % (ref 39.0–52.0)
Hemoglobin: 13.9 g/dL (ref 13.0–17.0)
Lymphocytes Relative: 17 % (ref 12–46)
Lymphs Abs: 2.9 10*3/uL (ref 0.7–4.0)
MCH: 29.1 pg (ref 26.0–34.0)
MCHC: 32.6 g/dL (ref 30.0–36.0)
MCV: 89.3 fL (ref 78.0–100.0)
MONO ABS: 1.4 10*3/uL — AB (ref 0.1–1.0)
Monocytes Relative: 8 % (ref 3–12)
NEUTROS PCT: 73 % (ref 43–77)
Neutro Abs: 12.3 10*3/uL — ABNORMAL HIGH (ref 1.7–7.7)
Platelets: 300 10*3/uL (ref 150–400)
RBC: 4.77 MIL/uL (ref 4.22–5.81)
RDW: 13.2 % (ref 11.5–15.5)
WBC: 16.9 10*3/uL — AB (ref 4.0–10.5)

## 2015-01-30 LAB — BASIC METABOLIC PANEL
Anion gap: 14 (ref 5–15)
BUN: 27 mg/dL — ABNORMAL HIGH (ref 6–23)
CO2: 23 mmol/L (ref 19–32)
CREATININE: 2.38 mg/dL — AB (ref 0.50–1.35)
Calcium: 9.4 mg/dL (ref 8.4–10.5)
Chloride: 99 mmol/L (ref 96–112)
GFR calc non Af Amer: 28 mL/min — ABNORMAL LOW (ref >90)
GFR, EST AFRICAN AMERICAN: 32 mL/min — AB (ref >90)
Glucose, Bld: 122 mg/dL — ABNORMAL HIGH (ref 70–99)
POTASSIUM: 3.5 mmol/L (ref 3.5–5.1)
Sodium: 136 mmol/L (ref 135–145)

## 2015-01-30 LAB — TSH: TSH: 1.629 u[IU]/mL (ref 0.350–4.500)

## 2015-01-30 MED ORDER — MORPHINE SULFATE 2 MG/ML IJ SOLN: 1.0000 mg | INTRAMUSCULAR | Status: DC | PRN

## 2015-01-30 MED ORDER — ACETAMINOPHEN 325 MG PO TABS: 650.0000 mg | ORAL_TABLET | Freq: Four times a day (QID) | ORAL | Status: DC | PRN

## 2015-01-30 MED ORDER — HEPARIN SODIUM (PORCINE) 5000 UNIT/ML IJ SOLN
5000.0000 [IU] | Freq: Three times a day (TID) | INTRAMUSCULAR | Status: DC
  Administered 2015-01-30 – 2015-01-31 (×3): 5000 [IU] via SUBCUTANEOUS
  Filled 2015-01-30 (×6): qty 1

## 2015-01-30 MED ORDER — SODIUM CHLORIDE 0.9 % IV SOLN: INTRAVENOUS | Status: DC

## 2015-01-30 MED ORDER — ACETAMINOPHEN 650 MG RE SUPP: 650.0000 mg | Freq: Four times a day (QID) | RECTAL | Status: DC | PRN

## 2015-01-30 MED ORDER — SODIUM CHLORIDE 0.9 % IV BOLUS (SEPSIS)
1000.0000 mL | Freq: Once | INTRAVENOUS | Status: AC
  Administered 2015-01-30: 1000 mL via INTRAVENOUS

## 2015-01-30 MED ORDER — AZITHROMYCIN 250 MG PO TABS
250.0000 mg | ORAL_TABLET | Freq: Every day | ORAL | Status: DC
  Administered 2015-01-30 – 2015-01-31 (×2): 250 mg via ORAL
  Filled 2015-01-30 (×2): qty 1

## 2015-01-30 MED ORDER — HYDROCODONE-ACETAMINOPHEN 5-325 MG PO TABS: 1.0000 | ORAL_TABLET | ORAL | Status: DC | PRN

## 2015-01-30 MED ORDER — SODIUM CHLORIDE 0.9 % IV SOLN
INTRAVENOUS | Status: DC
  Administered 2015-01-30 – 2015-01-31 (×3): via INTRAVENOUS

## 2015-01-30 MED ORDER — ENSURE ENLIVE PO LIQD
237.0000 mL | Freq: Two times a day (BID) | ORAL | Status: DC
  Administered 2015-01-30 – 2015-01-31 (×2): 237 mL via ORAL

## 2015-01-30 NOTE — ED Notes (Signed)
Pt reports already being diagnosed with strep throat, has been taking antibiotics. Pt went back to work today but not feeling well, having generalized fatigue and weakness. Airway intact.

## 2015-01-30 NOTE — ED Provider Notes (Signed)
CSN: [id number]     Arrival date & time 01/30/15  1215 History   First MD Initiated Contact with Patient 01/30/15 1225     Chief Complaint  Patient presents with  . Sore Throat  . Fatigue     (Consider location/radiation/quality/duration/timing/severity/associated sxs/prior Treatment) The history is provided by the patient and medical records.    This is a 59 year old male with past medical history significant for syncope, GERD, hypertension, rheumatic fever as a child, presenting to the ED for fatigue and generalized weakness.  Patient states he was seen 2 days ago for sore throat, diagnosed with strep throat and started on azithromycin as he is allergic to penicillin. He states his throat has started feeling better, has 2 doses of medication left. He attempted to return to work today and began feeling worse. He states he feels generally weak and fatigued. He denies any chest pain or shortness of breath.  He denies known fever, sweats, or chills. He does note decreased appetite and poor PO intake since diagnosis.  VSS on arrival.  Past Medical History  Diagnosis Date  . Rheumatic fever   . Hypertension   . OA (osteoarthritis)   . Encephalitis, viral   . GERD (gastroesophageal reflux disease)   . Syncope    Past Surgical History  Procedure Laterality Date  . Replacement total knee      left and right  . Tonsillectomy     Family History  Problem Relation Age of Onset  . Hypertension Mother   . Heart disease Mother   . Prostate cancer Father   . Depression Brother   . Hypertension Brother   . Hypertension Brother    History  Substance Use Topics  . Smoking status: Never Smoker   . Smokeless tobacco: Never Used  . Alcohol Use: No    Review of Systems  Constitutional: Positive for fever and appetite change.  HENT: Positive for sore throat.   All other systems reviewed and are negative.     Allergies  Penicillins  Home Medications   Prior to Admission medications    Medication Sig Start Date End Date Taking? Authorizing Provider  azithromycin (ZITHROMAX) 250 MG tablet 2 tabs on day one followed by 1 tab daily for 4 days 01/28/15   Lucretia Kern, DO  ibuprofen (ADVIL,MOTRIN) 800 MG tablet  12/20/11   Historical Provider, MD  lisinopril (PRINIVIL,ZESTRIL) 40 MG tablet TAKE 2 TABLETS EVERY MORNING 06/18/14   Dorena Cookey, MD  Omeprazole 20 MG TBEC Take by mouth.      Historical Provider, MD  sildenafil (VIAGRA) 100 MG tablet Take 0.5-1 tablets (50-100 mg total) by mouth daily as needed for erectile dysfunction. 04/14/14   Dorena Cookey, MD   BP 102/77 mmHg  Pulse 90  Temp(Src) 97.5 F (36.4 C) (Oral)  Resp 18  SpO2 92%   Physical Exam  Constitutional: He is oriented to person, place, and time. He appears well-developed and well-nourished. No distress.  HENT:  Head: Normocephalic and atraumatic.  Right Ear: Tympanic membrane and ear canal normal.  Left Ear: Tympanic membrane and ear canal normal.  Nose: Nose normal.  Mouth/Throat: Uvula is midline, oropharynx is clear and moist and mucous membranes are normal. No oropharyngeal exudate, posterior oropharyngeal edema, posterior oropharyngeal erythema or tonsillar abscesses.  Mildly dry mucous membranes; tonsils normal in appearance bilaterally without exudate; uvula midline without peritonsillar abscess; handling secretions appropriately; no difficulty swallowing or speaking  Eyes: Conjunctivae and EOM are normal. Pupils  are equal, round, and reactive to light.  Neck: Normal range of motion. Neck supple.  Cardiovascular: Normal rate, regular rhythm and normal heart sounds.   Pulmonary/Chest: Effort normal and breath sounds normal. No respiratory distress. He has no wheezes.  Abdominal: Soft. Bowel sounds are normal. There is no tenderness. There is no guarding.  Musculoskeletal: Normal range of motion. He exhibits no edema.  Neurological: He is alert and oriented to person, place, and time.  Skin: Skin  is warm and dry. He is not diaphoretic.  Psychiatric: He has a normal mood and affect.  Nursing note and vitals reviewed.   ED Course  Procedures (including critical care time) Labs Review Labs Reviewed  CBC WITH DIFFERENTIAL/PLATELET - Abnormal; Notable for the following:    WBC 16.9 (*)    Neutro Abs 12.3 (*)    Monocytes Absolute 1.4 (*)    All other components within normal limits  BASIC METABOLIC PANEL - Abnormal; Notable for the following:    Glucose, Bld 122 (*)    BUN 27 (*)    Creatinine, Ser 2.38 (*)    GFR calc non Af Amer 28 (*)    GFR calc Af Amer 32 (*)    All other components within normal limits    Imaging Review Dg Chest 2 View  01/30/2015   CLINICAL DATA:  Strep throat.  Fatigue.  Hypertension.  Nonsmoker.  EXAM: CHEST  2 VIEW  COMPARISON:  10/15/2007  FINDINGS: Midline trachea. Normal heart size and mediastinal contours. No pleural effusion or pneumothorax. Patchy opacity projecting over the right upper lobe on the frontal radiograph. Not localized on the lateral view. Clear left lung. Right upper lobe opacity, only readily apparent on the frontal radiograph. Given the clinical history, favored to represent infection. Recommend radiographic follow-up until clearing.  IMPRESSION: No active cardiopulmonary disease.   Electronically Signed   By: Abigail Miyamoto M.D.   On: 01/30/2015 13:51     EKG Interpretation None      MDM   Final diagnoses:  Sore throat  Fatigue  AKI (acute kidney injury)   59 year old male with generalized weakness and fatigue. Recent diagnosis of strep with 2 days of remaining treatment. On exam, patient is afebrile and nontoxic in appearance.  HEENT exam WNL, handling secretions well.  Lung sounds are clear, borderline hypoxia at 92%.  Will obtain basic labs, CXR.  IVF started.  CXR clear.  Lab work with elevated SrCr at 2.8 (baseline around 1)-- consistent with AKI, likely secondary to dehydration.  Will need admission for continued IV  fluids until SrCr returns to baseline.  Case discussed with Dr. Hartford Poli [facility] who will admit for further management.  Temp admission orders placed, VS remain stable.  Larene Pickett, PA-C 01/30/15 [place], MD 01/31/15 [phone]

## 2015-01-30 NOTE — H&P (Signed)
[facility] History and Physical  Jacob Quinn SLH:[id number] DOB: 06/26/1955 DOA: 01/30/2015  Referring physician: EDP PCP: Joycelyn Man, MD   Chief Complaint: Tired and generalized weakness  HPI: Jacob Quinn is a 59 y.o. male with past medical history of hypertension and GERD came in the hospital complaining about generalized weakness. Patient stated that for past several days he had sore throat, he went and saw his PCP on the 3/31st and was given azithromycin, he felt much better in terms of his throat but he continues to have generalized weakness. He decided to come to the ED today. In the ED he was found to have creatinine of 2.38, his baseline creatinine was 1.0 from about a year ago. Patient is on 40 mg of lisinopril, patient will be admitted to the hospital for further evaluation.  Review of Systems:  Constitutional: Generalized weakness Eyes: negative for irritation, redness and visual disturbance Ears, nose, mouth, throat, and face: negative for earaches, epistaxis, nasal congestion and sore throat Respiratory: negative for cough, dyspnea on exertion, sputum and wheezing Cardiovascular: negative for chest pain, dyspnea, lower extremity edema, orthopnea, palpitations and syncope Gastrointestinal: negative for abdominal pain, constipation, diarrhea, melena, nausea and vomiting Genitourinary:negative for dysuria, frequency and hematuria Hematologic/lymphatic: negative for bleeding, easy bruising and lymphadenopathy Musculoskeletal:negative for arthralgias, muscle weakness and stiff joints Neurological: negative for coordination problems, gait problems, headaches and weakness Endocrine: negative for diabetic symptoms including polydipsia, polyuria and weight loss Allergic/Immunologic: negative for anaphylaxis, hay fever and urticaria  Past Medical History  Diagnosis Date  . Rheumatic fever   . Hypertension   . OA (osteoarthritis)   . Encephalitis, viral     . GERD (gastroesophageal reflux disease)   . Syncope    Past Surgical History  Procedure Laterality Date  . Replacement total knee      left and right  . Tonsillectomy     Social History:   reports that he has never smoked. He has never used smokeless tobacco. He reports that he does not drink alcohol or use illicit drugs.  Allergies  Allergen Reactions  . Penicillins     Family History  Problem Relation Age of Onset  . Hypertension Mother   . Heart disease Mother   . Prostate cancer Father   . Depression Brother   . Hypertension Brother   . Hypertension Brother      Prior to Admission medications   Medication Sig Start Date End Date Taking? Authorizing Provider  azithromycin (ZITHROMAX) 250 MG tablet 2 tabs on day one followed by 1 tab daily for 4 days 01/28/15  Yes Lucretia Kern, DO  lisinopril (PRINIVIL,ZESTRIL) 40 MG tablet TAKE 2 TABLETS EVERY MORNING 06/18/14  Yes Dorena Cookey, MD  sildenafil (VIAGRA) 100 MG tablet Take 0.5-1 tablets (50-100 mg total) by mouth daily as needed for erectile dysfunction. Patient not taking: Reported on 01/30/2015 04/14/14   Dorena Cookey, MD   Physical Exam: Filed Vitals:   01/30/15 1330  BP: 111/69  Pulse: 75  Temp:   Resp:    Constitutional: Oriented to person, place, and time. Well-developed and well-nourished. Cooperative.  Head: Normocephalic and atraumatic.  Nose: Nose normal.  Mouth/Throat: Uvula is midline, oropharynx is clear and moist and mucous membranes are normal.  Eyes: Conjunctivae and EOM are normal. Pupils are equal, round, and reactive to light.  Neck: Trachea normal and normal range of motion. Neck supple.  Cardiovascular: Normal rate, regular rhythm, S1 normal, S2 normal, normal heart sounds  and intact distal pulses.   Pulmonary/Chest: Effort normal and breath sounds normal.  Abdominal: Soft. Bowel sounds are normal. There is no hepatosplenomegaly. There is no tenderness.  Musculoskeletal: Normal range of  motion.  Neurological: Alert and oriented to person, place, and time. Has normal strength. No cranial nerve deficit or sensory deficit.  Skin: Skin is warm, dry and intact.  Psychiatric: Has a normal mood and affect. Speech is normal and behavior is normal.   Labs on Admission:  Basic Metabolic Panel:  Recent Labs Lab 01/30/15 1249  NA 136  K 3.5  CL 99  CO2 23  GLUCOSE 122*  BUN 27*  CREATININE 2.38*  CALCIUM 9.4   Liver Function Tests: No results for input(s): AST, ALT, ALKPHOS, BILITOT, PROT, ALBUMIN in the last 168 hours. No results for input(s): LIPASE, AMYLASE in the last 168 hours. No results for input(s): AMMONIA in the last 168 hours. CBC:  Recent Labs Lab 01/30/15 1249  WBC 16.9*  NEUTROABS 12.3*  HGB 13.9  HCT 42.6  MCV 89.3  PLT 300   Cardiac Enzymes: No results for input(s): CKTOTAL, CKMB, CKMBINDEX, TROPONINI in the last 168 hours.  BNP (last 3 results) No results for input(s): BNP in the last 8760 hours.  ProBNP (last 3 results) No results for input(s): PROBNP in the last 8760 hours.  CBG: No results for input(s): GLUCAP in the last 168 hours.  Radiological Exams on Admission: Dg Chest 2 View  01/30/2015   CLINICAL DATA:  Strep throat.  Fatigue.  Hypertension.  Nonsmoker.  EXAM: CHEST  2 VIEW  COMPARISON:  10/15/2007  FINDINGS: Midline trachea. Normal heart size and mediastinal contours. No pleural effusion or pneumothorax. Patchy opacity projecting over the right upper lobe on the frontal radiograph. Not localized on the lateral view. Clear left lung. Right upper lobe opacity, only readily apparent on the frontal radiograph. Given the clinical history, favored to represent infection. Recommend radiographic follow-up until clearing.  IMPRESSION: No active cardiopulmonary disease.   Electronically Signed   By: Abigail Miyamoto M.D.   On: 01/30/2015 13:51    EKG: No EKG  Assessment/Plan Active Problems:   Essential hypertension   GERD   Acute renal  failure   AKI (acute kidney injury)    Acute renal failure Secondary to both dehydration synergizedby the use of ACE inhibitor. Lisinopril, hydrated aggressively with IV fluids. Check BMP in a.m., if improved and normal or close to normal patient can be discharged home. If creatinine is about the same or worsening might need further workup like ultrasound and even nephrology consultation.  Hypertension Lisinopril, hold because of concurrent acute renal failure.  GERD Continue home medications.  Recent Streptococcus sore throat Patient is still on Z-Pak, continue.  Code Status: Full code Family Communication: Plan discussed with the patient presence of multiple family members at bedside. Disposition Plan: MedSurg, observation  Time spent: 70 minutes  Joua Bake A, MD [facility] Pager [phone]

## 2015-01-31 DIAGNOSIS — J029 Acute pharyngitis, unspecified: Secondary | ICD-10-CM

## 2015-01-31 LAB — BASIC METABOLIC PANEL
Anion gap: 6 (ref 5–15)
BUN: 16 mg/dL (ref 6–23)
CO2: 24 mmol/L (ref 19–32)
Calcium: 8.5 mg/dL (ref 8.4–10.5)
Chloride: 107 mmol/L (ref 96–112)
Creatinine, Ser: 1.22 mg/dL (ref 0.50–1.35)
GFR calc non Af Amer: 62 mL/min — ABNORMAL LOW (ref >90)
GFR, EST AFRICAN AMERICAN: 72 mL/min — AB (ref >90)
GLUCOSE: 96 mg/dL (ref 70–99)
Potassium: 4.2 mmol/L (ref 3.5–5.1)
SODIUM: 137 mmol/L (ref 135–145)

## 2015-01-31 LAB — CBC
HEMATOCRIT: 40.6 % (ref 39.0–52.0)
Hemoglobin: 12.8 g/dL — ABNORMAL LOW (ref 13.0–17.0)
MCH: 28.8 pg (ref 26.0–34.0)
MCHC: 31.5 g/dL (ref 30.0–36.0)
MCV: 91.2 fL (ref 78.0–100.0)
Platelets: 278 10*3/uL (ref 150–400)
RBC: 4.45 MIL/uL (ref 4.22–5.81)
RDW: 13.4 % (ref 11.5–15.5)
WBC: 10 10*3/uL (ref 4.0–10.5)

## 2015-01-31 NOTE — Discharge Instructions (Signed)
Hold lisinopril until seen by PCP and cleared to resume

## 2015-01-31 NOTE — Discharge Summary (Signed)
Physician Discharge Summary  HILLERY BHALLA MRN: [id number] DOB/AGE: 61/28/1956 61 y.o.  PCP: Joycelyn Man, MD   Admit date: 01/30/2015 Discharge date: 01/31/2015  Discharge Diagnoses:     Active Problems:   Essential hypertension   GERD   Acute renal failure   AKI (acute kidney injury)  Follow-up recommendations Follow-up with PCP in 5-7 days Follow-up CBC, CMP in 1 week    Medication List    STOP taking these medications        lisinopril 40 MG tablet  Commonly known as:  PRINIVIL,ZESTRIL      TAKE these medications        azithromycin 250 MG tablet  Commonly known as:  ZITHROMAX  2 tabs on day one followed by 1 tab daily for 4 days     sildenafil 100 MG tablet  Commonly known as:  VIAGRA  Take 0.5-1 tablets (50-100 mg total) by mouth daily as needed for erectile dysfunction.        Discharge Condition stable Disposition:    Consults:  None  Significant Diagnostic Studies: Dg Chest 2 View  01/30/2015   CLINICAL DATA:  Strep throat.  Fatigue.  Hypertension.  Nonsmoker.  EXAM: CHEST  2 VIEW  COMPARISON:  10/15/2007  FINDINGS: Midline trachea. Normal heart size and mediastinal contours. No pleural effusion or pneumothorax. Patchy opacity projecting over the right upper lobe on the frontal radiograph. Not localized on the lateral view. Clear left lung. Right upper lobe opacity, only readily apparent on the frontal radiograph. Given the clinical history, favored to represent infection. Recommend radiographic follow-up until clearing.  IMPRESSION: No active cardiopulmonary disease.   Electronically Signed   By: Abigail Miyamoto M.D.   On: 01/30/2015 13:51      Microbiology: No results found for this or any previous visit (from the past 240 hour(s)).   Labs: Results for orders placed or performed during the hospital encounter of 01/30/15 (from the past 48 hour(s))  CBC with Differential     Status: Abnormal   Collection Time: 01/30/15 12:49 PM  Result  Value Ref Range   WBC 16.9 (H) 4.0 - 10.5 K/uL   RBC 4.77 4.22 - 5.81 MIL/uL   Hemoglobin 13.9 13.0 - 17.0 g/dL   HCT 42.6 39.0 - 52.0 %   MCV 89.3 78.0 - 100.0 fL   MCH 29.1 26.0 - 34.0 pg   MCHC 32.6 30.0 - 36.0 g/dL   RDW 13.2 11.5 - 15.5 %   Platelets 300 150 - 400 K/uL   Neutrophils Relative % 73 43 - 77 %   Lymphocytes Relative 17 12 - 46 %   Monocytes Relative 8 3 - 12 %   Eosinophils Relative 2 0 - 5 %   Basophils Relative 0 0 - 1 %   Neutro Abs 12.3 (H) 1.7 - 7.7 K/uL   Lymphs Abs 2.9 0.7 - 4.0 K/uL   Monocytes Absolute 1.4 (H) 0.1 - 1.0 K/uL   Eosinophils Absolute 0.3 0.0 - 0.7 K/uL   Basophils Absolute 0.0 0.0 - 0.1 K/uL   Smear Review MORPHOLOGY UNREMARKABLE   Basic metabolic panel     Status: Abnormal   Collection Time: 01/30/15 12:49 PM  Result Value Ref Range   Sodium 136 135 - 145 mmol/L   Potassium 3.5 3.5 - 5.1 mmol/L   Chloride 99 96 - 112 mmol/L   CO2 23 19 - 32 mmol/L   Glucose, Bld 122 (H) 70 - 99 mg/dL  BUN 27 (H) 6 - 23 mg/dL   Creatinine, Ser 2.38 (H) 0.50 - 1.35 mg/dL   Calcium 9.4 8.4 - 10.5 mg/dL   GFR calc non Af Amer 28 (L) >90 mL/min   GFR calc Af Amer 32 (L) >90 mL/min    Comment: (NOTE) The eGFR has been calculated using the CKD EPI equation. This calculation has not been validated in all clinical situations. eGFR's persistently <90 mL/min signify possible Chronic Kidney Disease.    Anion gap 14 5 - 15  Protime-INR     Status: None   Collection Time: 01/30/15  6:53 PM  Result Value Ref Range   Prothrombin Time 14.3 11.6 - 15.2 seconds   INR 1.10 0.00 - 1.49  TSH     Status: None   Collection Time: 01/30/15  6:53 PM  Result Value Ref Range   TSH 1.629 0.350 - 4.500 uIU/mL  Basic metabolic panel     Status: Abnormal   Collection Time: 01/31/15  6:34 AM  Result Value Ref Range   Sodium 137 135 - 145 mmol/L   Potassium 4.2 3.5 - 5.1 mmol/L   Chloride 107 96 - 112 mmol/L   CO2 24 19 - 32 mmol/L   Glucose, Bld 96 70 - 99 mg/dL    BUN 16 6 - 23 mg/dL   Creatinine, Ser 1.22 0.50 - 1.35 mg/dL   Calcium 8.5 8.4 - 10.5 mg/dL   GFR calc non Af Amer 62 (L) >90 mL/min   GFR calc Af Amer 72 (L) >90 mL/min    Comment: (NOTE) The eGFR has been calculated using the CKD EPI equation. This calculation has not been validated in all clinical situations. eGFR's persistently <90 mL/min signify possible Chronic Kidney Disease.    Anion gap 6 5 - 15  CBC     Status: Abnormal   Collection Time: 01/31/15  6:34 AM  Result Value Ref Range   WBC 10.0 4.0 - 10.5 K/uL   RBC 4.45 4.22 - 5.81 MIL/uL   Hemoglobin 12.8 (L) 13.0 - 17.0 g/dL   HCT 40.6 39.0 - 52.0 %   MCV 91.2 78.0 - 100.0 fL   MCH 28.8 26.0 - 34.0 pg   MCHC 31.5 30.0 - 36.0 g/dL   RDW 13.4 11.5 - 15.5 %   Platelets 278 150 - 400 K/uL     HPI :Jacob Quinn is a 59 y.o. male with past medical history of hypertension and GERD came in the hospital complaining about generalized weakness. Patient stated that for past several days he had sore throat, he went and saw his PCP on the 3/31st and was given azithromycin, he felt much better in terms of his throat but he continues to have generalized weakness. He decided to come to the ED today. In the ED he was found to have creatinine of 2.38, his baseline creatinine was 1.0 from about a year ago. Patient is on 40 mg of lisinopril, patient will be admitted to the hospital for further evaluation.   HOSPITAL COURSE:   Acute renal failure Secondary to both dehydration and use of ACE inhibitor. Lisinopril, has been held, patient responded well to IV hydration and creatinine is back down to normal Patient will be discharged home today Instructed to hold lisinopril until seen by PCP  Hypertension Lisinopril, hold because of concurrent acute renal failure.  GERD Continue home medications.  Recent Streptococcus sore throat Patient is still on Z-Pak, continue.   Discharge Exam:   Blood  pressure 124/78, pulse 67,  temperature 97.7 F (36.5 C), temperature source Oral, resp. rate 16, height 6' (1.829 m), weight 104.69 kg (230 lb 12.8 oz), SpO2 99 %.  Eyes: Conjunctivae and EOM are normal. Pupils are equal, round, and reactive to light.  Neck: Trachea normal and normal range of motion. Neck supple.  Cardiovascular: Normal rate, regular rhythm, S1 normal, S2 normal, normal heart sounds and intact distal pulses.  Pulmonary/Chest: Effort normal and breath sounds normal.  Abdominal: Soft. Bowel sounds are normal. There is no hepatosplenomegaly. There is no tenderness.  Musculoskeletal: Normal range of motion.  Neurological: Alert and oriented to person, place, and time. Has normal strength. No cranial nerve deficit or sensory deficit.  Skin: Skin is warm, dry and intact.  Psychiatric: Has a normal mood and affect. Speech is normal and behavior is normal.        Discharge Instructions    Diet - low sodium heart healthy    Complete by:  As directed      Increase activity slowly    Complete by:  As directed            Follow-up Information    Follow up with TODD,JEFFREY ALLEN, MD. Schedule an appointment as soon as possible for a visit in 3 days.   Specialty:  Family Medicine   Why:   repeat CBC, CMP   Contact information:   [address] [place] [phone]       Signed: Reyne Dumas 01/31/2015, 12:45 PM

## 2015-01-31 NOTE — Progress Notes (Signed)
NURSING PROGRESS NOTE  Jacob Quinn [id number] Discharge Data: 01/31/2015 2:03 PM Attending Provider: Reyne Dumas, MD LOV:FIEP,PIRJJOA Zenia Resides, MD     Derl Barrow to be D/C'd Home per MD order.  Discussed with the patient the After Visit Summary and all questions fully answered. All IV's discontinued with no bleeding noted. All belongings returned to patient for patient to take home.   Last Vital Signs:  Blood pressure 140/82, pulse 70, temperature 98.2 F (36.8 C), temperature source Oral, resp. rate 20, height 6' (1.829 m), weight 104.69 kg (230 lb 12.8 oz), SpO2 99 %.  Discharge Medication List   Medication List    STOP taking these medications        lisinopril 40 MG tablet  Commonly known as:  PRINIVIL,ZESTRIL      TAKE these medications        azithromycin 250 MG tablet  Commonly known as:  ZITHROMAX  2 tabs on day one followed by 1 tab daily for 4 days     sildenafil 100 MG tablet  Commonly known as:  VIAGRA  Take 0.5-1 tablets (50-100 mg total) by mouth daily as needed for erectile dysfunction.         Wallie Renshaw, RN

## 2015-01-31 NOTE — Progress Notes (Signed)
UR completed 

## 2015-02-01 ENCOUNTER — Telehealth: Payer: Self-pay | Admitting: Family Medicine

## 2015-02-01 ENCOUNTER — Encounter: Payer: Self-pay | Admitting: *Deleted

## 2015-02-01 LAB — HEMOGLOBIN A1C
Hgb A1c MFr Bld: 5.6 % (ref 4.8–5.6)
MEAN PLASMA GLUCOSE: 114 mg/dL

## 2015-02-01 NOTE — Treatment Plan (Signed)
       Jacob Quinn  DOB/AGE: 61/28/1956 59 y.o.  Discharge Data: 01/31/2015 2:03 PM   Patient can go back to work on 4/11   SignedReyne Dumas [phone] 01/31/2015, 12:45 PM

## 2015-02-01 NOTE — Telephone Encounter (Signed)
Pt is a pt of dr todd, [place] saw dr Maudie Mercury last week.  Pt needs a note to return to work.pt was out of work all week.  Pt would like to get the note asap. Pt wants to come and  wait on the note. advised pt you were seeing pts, however, pt is anxouis to return to work and they will not let him until he gets note from dr.  Pt works at [employer].Marland Kitchen Pt is on the way.

## 2015-02-01 NOTE — Telephone Encounter (Signed)
Note completed and given to the pt.

## 2015-02-01 NOTE — Telephone Encounter (Signed)
Write note - ok to return to work.   Usually can return to work after 24 hours after starting treatment.

## 2015-02-11 ENCOUNTER — Encounter: Payer: Self-pay | Admitting: Family Medicine

## 2015-02-11 ENCOUNTER — Ambulatory Visit (INDEPENDENT_AMBULATORY_CARE_PROVIDER_SITE_OTHER): Payer: BLUE CROSS/BLUE SHIELD | Admitting: Family Medicine

## 2015-02-11 VITALS — BP 152/108 | HR 80 | Temp 98.3°F | Wt 237.8 lb

## 2015-02-11 DIAGNOSIS — I10 Essential (primary) hypertension: Secondary | ICD-10-CM

## 2015-02-11 LAB — BASIC METABOLIC PANEL
BUN: 11 mg/dL (ref 6–23)
CALCIUM: 9.8 mg/dL (ref 8.4–10.5)
CO2: 30 meq/L (ref 19–32)
CREATININE: 0.98 mg/dL (ref 0.40–1.50)
Chloride: 100 mEq/L (ref 96–112)
GFR: 99.79 mL/min (ref >60.00)
Glucose, Bld: 86 mg/dL (ref 70–99)
Potassium: 3.9 mEq/L (ref 3.5–5.1)
Sodium: 136 mEq/L (ref 135–145)

## 2015-02-11 MED ORDER — AMLODIPINE BESYLATE 5 MG PO TABS: 5.0000 mg | ORAL_TABLET | Freq: Every day | ORAL | Status: DC

## 2015-02-11 NOTE — Patient Instructions (Signed)
Norvasc 5 mg....... one tablet daily in the morning  Omron pump up digital blood pressure cuff......Marland Kitchen [place] your blood pressure daily in the morning  Return in 2 weeks for follow-up  Labs today

## 2015-02-11 NOTE — Progress Notes (Signed)
   Subjective:    Patient ID: Jacob Quinn, male    DOB: 06/26/1955, 59 y.o.   MRN: [id number]  HPI Worley is a 59 year old male who comes in today for follow-up of hypertension  He developed a viral syndrome with the emergency room was slightly dehydrated. His BUNs 25 creatinine 2.7. His lisinopril was stopped. He's here for follow-up.   Review of Systems Review of systems negative    Objective:   Physical Exam  Well-developed well-nourished male no acute distress vital signs stable he is afebrile BP 150/90 right arm sitting position      Assessment & Plan:  Elevation renal function secondary to dehydration and being on the ACE inhibitor...Marland KitchenMarland Kitchen. discontinue the ACE inhibitor......... check renal function..... Changed to Norvasc..... BP check daily.... Follow-up in 2 weeks.

## 2015-02-11 NOTE — Progress Notes (Signed)
Pre visit review using our clinic review tool, if applicable. No additional management support is needed unless otherwise documented below in the visit note. 

## 2015-02-25 ENCOUNTER — Ambulatory Visit: Payer: BLUE CROSS/BLUE SHIELD | Admitting: Family Medicine

## 2015-03-02 ENCOUNTER — Ambulatory Visit (INDEPENDENT_AMBULATORY_CARE_PROVIDER_SITE_OTHER): Payer: BLUE CROSS/BLUE SHIELD | Admitting: Family Medicine

## 2015-03-02 VITALS — BP 135/85 | Temp 99.3°F | Wt 241.0 lb

## 2015-03-02 DIAGNOSIS — I10 Essential (primary) hypertension: Secondary | ICD-10-CM

## 2015-03-02 NOTE — Patient Instructions (Addendum)
Continue current medication  You're due for your physical examination this summer please set up a time with Netta Corrigan our new adult nurse practitioner in June or July    [phone] and............ asked for GI to find out when your colonoscopy is due

## 2015-03-02 NOTE — Progress Notes (Signed)
Pre visit review using our clinic review tool, if applicable. No additional management support is needed unless otherwise documented below in the visit note. 

## 2015-03-02 NOTE — Progress Notes (Signed)
   Subjective:    Patient ID: Jacob Quinn, male    DOB: 06/26/1955, 59 y.o.   MRN: [id number]  HPI  Jacob Quinn is a 59 year old married male nonsmoker who comes in today for reevaluation of hypertension  His BP is 135/85 at home. He's on Norvasc 5 mg daily  Your colonoscopy in 2013 he was told he had some polyps he does not recall when he is due to go back for follow-up   Review of Systems    review of systems otherwise negative Objective:   Physical Exam  Well-developed well-nourished male no acute distress vital signs stable he is afebrile BP right arm sitting position 135/85      Assessment & Plan:  Hypertension at goal continue current therapy follow-up annual physical examination  History of colon polyps,,,,,,, call GI to find out when he is due for follow-up

## 2015-03-17 ENCOUNTER — Telehealth: Payer: Self-pay | Admitting: Family Medicine

## 2015-03-17 NOTE — Telephone Encounter (Signed)
Manuela Schwartz from [facility] called to say that she enroll the pt in case management and discuss pain management .  Marland Kitchen bp has been a little high 130/90 she told pt to let the doctor know if it continues. Advise pt to check with doctor about taking ibuprofen    Manuela Schwartz phone number  [phone]   Ext [phone]

## 2015-03-18 NOTE — Telephone Encounter (Signed)
noted 

## 2015-05-11 ENCOUNTER — Other Ambulatory Visit: Payer: BLUE CROSS/BLUE SHIELD

## 2015-05-12 ENCOUNTER — Encounter: Payer: Self-pay | Admitting: Gastroenterology

## 2015-05-13 ENCOUNTER — Other Ambulatory Visit (INDEPENDENT_AMBULATORY_CARE_PROVIDER_SITE_OTHER): Payer: BLUE CROSS/BLUE SHIELD

## 2015-05-13 DIAGNOSIS — Z0000 Encounter for general adult medical examination without abnormal findings: Secondary | ICD-10-CM | POA: Diagnosis not present

## 2015-05-13 LAB — LIPID PANEL
Cholesterol: 200 mg/dL (ref 0–200)
HDL: 39.2 mg/dL (ref >39.00)
LDL Cholesterol: 133 mg/dL — ABNORMAL HIGH (ref 0–99)
NONHDL: 160.8
Total CHOL/HDL Ratio: 5
Triglycerides: 139 mg/dL (ref 0.0–149.0)
VLDL: 27.8 mg/dL (ref 0.0–40.0)

## 2015-05-13 LAB — CBC WITH DIFFERENTIAL/PLATELET
BASOS ABS: 0 10*3/uL (ref 0.0–0.1)
BASOS PCT: 0.3 % (ref 0.0–3.0)
EOS ABS: 0.2 10*3/uL (ref 0.0–0.7)
Eosinophils Relative: 2.2 % (ref 0.0–5.0)
HCT: 38.5 % — ABNORMAL LOW (ref 39.0–52.0)
Hemoglobin: 12.8 g/dL — ABNORMAL LOW (ref 13.0–17.0)
LYMPHS ABS: 2.2 10*3/uL (ref 0.7–4.0)
Lymphocytes Relative: 31.2 % (ref 12.0–46.0)
MCHC: 33.2 g/dL (ref 30.0–36.0)
MCV: 87.3 fl (ref 78.0–100.0)
Monocytes Absolute: 0.5 10*3/uL (ref 0.1–1.0)
Monocytes Relative: 7.2 % (ref 3.0–12.0)
NEUTROS ABS: 4.2 10*3/uL (ref 1.4–7.7)
Neutrophils Relative %: 59.1 % (ref 43.0–77.0)
Platelets: 268 10*3/uL (ref 150.0–400.0)
RBC: 4.41 Mil/uL (ref 4.22–5.81)
RDW: 14.8 % (ref 11.5–15.5)
WBC: 7.2 10*3/uL (ref 4.0–10.5)

## 2015-05-13 LAB — POCT URINALYSIS DIPSTICK
BILIRUBIN UA: NEGATIVE
Glucose, UA: NEGATIVE
KETONES UA: NEGATIVE
LEUKOCYTES UA: NEGATIVE
Nitrite, UA: NEGATIVE
PH UA: 5.5
Protein, UA: NEGATIVE
RBC UA: NEGATIVE
Spec Grav, UA: 1.02
Urobilinogen, UA: 0.2

## 2015-05-13 LAB — BASIC METABOLIC PANEL
BUN: 11 mg/dL (ref 6–23)
CO2: 30 meq/L (ref 19–32)
Calcium: 9.5 mg/dL (ref 8.4–10.5)
Chloride: 102 mEq/L (ref 96–112)
Creatinine, Ser: 1.07 mg/dL (ref 0.40–1.50)
GFR: 90.1 mL/min (ref >60.00)
Glucose, Bld: 89 mg/dL (ref 70–99)
Potassium: 4 mEq/L (ref 3.5–5.1)
Sodium: 139 mEq/L (ref 135–145)

## 2015-05-13 LAB — HEPATIC FUNCTION PANEL
ALBUMIN: 4.1 g/dL (ref 3.5–5.2)
ALT: 9 U/L (ref 0–53)
AST: 14 U/L (ref 0–37)
Alkaline Phosphatase: 74 U/L (ref 39–117)
BILIRUBIN DIRECT: 0.1 mg/dL (ref 0.0–0.3)
TOTAL PROTEIN: 7.3 g/dL (ref 6.0–8.3)
Total Bilirubin: 0.5 mg/dL (ref 0.2–1.2)

## 2015-05-13 LAB — PSA: PSA: 1.84 ng/mL (ref 0.10–4.00)

## 2015-05-13 LAB — TSH: TSH: 1.84 u[IU]/mL (ref 0.35–4.50)

## 2015-05-17 ENCOUNTER — Ambulatory Visit (INDEPENDENT_AMBULATORY_CARE_PROVIDER_SITE_OTHER): Payer: BLUE CROSS/BLUE SHIELD | Admitting: Adult Health

## 2015-05-17 ENCOUNTER — Encounter: Payer: Self-pay | Admitting: Adult Health

## 2015-05-17 VITALS — BP 130/78 | Temp 97.9°F | Ht 72.0 in | Wt 243.0 lb

## 2015-05-17 DIAGNOSIS — I10 Essential (primary) hypertension: Secondary | ICD-10-CM | POA: Diagnosis not present

## 2015-05-17 DIAGNOSIS — N400 Enlarged prostate without lower urinary tract symptoms: Secondary | ICD-10-CM

## 2015-05-17 DIAGNOSIS — Z0000 Encounter for general adult medical examination without abnormal findings: Secondary | ICD-10-CM | POA: Diagnosis not present

## 2015-05-17 MED ORDER — TAMSULOSIN HCL 0.4 MG PO CAPS: 0.4000 mg | ORAL_CAPSULE | Freq: Every day | ORAL | Status: DC

## 2015-05-17 NOTE — Progress Notes (Signed)
Subjective:    Patient ID: Jacob Quinn, male    DOB: 06/26/1955, 59 y.o.   MRN: [id number]  HPI Jacob Quinn is a 59 year old male who comes in today for routine physical exam because of a history of hypertension and erectile dysfunction  He gets routine eye care, dental care, colonoscopy and GI normal,  Vaccinations up-to-date  He says he feels well and has no complaints. He is due to get an eye exam  His only complaint today is that of urinary frequency and nocturia. This has been going on "for a couple of years". He endorses that he has to urinate frequently during the day and he is up almost every hour during the night to  Endorses that it is getting worse.Marland Kitchen He denies hesitancy or burnin  Review of Systems  Constitutional: Negative.   HENT: Negative.   Eyes: Negative.   Respiratory: Negative.   Cardiovascular: Negative.   Gastrointestinal: Negative.   Genitourinary: Positive for frequency.       Nocturia  Musculoskeletal: Positive for myalgias and arthralgias (bilateral knees).  Skin: Negative.   Allergic/Immunologic: Negative.   Neurological: Negative.   Hematological: Negative.   Psychiatric/Behavioral: Negative.   All other systems reviewed and are negative.  Past Medical History  Diagnosis Date  . Rheumatic fever   . Hypertension   . OA (osteoarthritis)   . Encephalitis, viral   . GERD (gastroesophageal reflux disease)   . Syncope     History   Social History  . Marital Status: Single    Spouse Name: N/A  . Number of Children: 3  . Years of Education: N/A   Occupational History  . [employer]iles     [name]   Social History Main Topics  . Smoking status: Never Smoker   . Smokeless tobacco: Never Used  . Alcohol Use: No  . Drug Use: No  . Sexual Activity: Yes   Other Topics Concern  . Not on file   Social History Narrative    Past Surgical History  Procedure Laterality Date  . Replacement total knee      left and right  . Tonsillectomy       Family History  Problem Relation Age of Onset  . Hypertension Mother   . Heart disease Mother   . Prostate cancer Father   . Depression Brother   . Hypertension Brother   . Hypertension Brother     Allergies  Allergen Reactions  . Penicillins     Current Outpatient Prescriptions on File Prior to Visit  Medication Sig Dispense Refill  . amLODipine (NORVASC) 5 MG tablet Take 1 tablet (5 mg total) by mouth daily. 90 tablet 3  . sildenafil (VIAGRA) 100 MG tablet Take 0.5-1 tablets (50-100 mg total) by mouth daily as needed for erectile dysfunction. 90 tablet 3   No current facility-administered medications on file prior to visit.    BP 130/78 mmHg  Temp(Src) 97.9 F (36.6 C) (Oral)  Ht 6' (1.829 m)  Wt 243 lb (110.224 kg)  BMI 32.95 kg/m2       Objective:   Physical Exam  Constitutional: He is oriented to person, place, and time. He appears well-developed and well-nourished. No distress.  HENT:  Head: Normocephalic and atraumatic.  Right Ear: External ear normal.  Left Ear: External ear normal.  Nose: Nose normal.  Mouth/Throat: Oropharynx is clear and moist. No oropharyngeal exudate.  Eyes: Conjunctivae and EOM are normal. Pupils are equal, round, and reactive to light. Right  eye exhibits no discharge. Left eye exhibits no discharge. No scleral icterus.  Neck: Normal range of motion. Neck supple. No JVD present. No tracheal deviation present. No thyromegaly present.  Cardiovascular: Normal rate, regular rhythm, normal heart sounds and intact distal pulses.  Exam reveals no gallop and no friction rub.   No murmur heard. Pulmonary/Chest: Effort normal and breath sounds normal. No respiratory distress. He has no wheezes. He has no rales. He exhibits no tenderness.  Abdominal: Soft. Bowel sounds are normal. He exhibits no distension and no mass. There is no tenderness. There is no rebound and no guarding.  Genitourinary: Rectum normal and penis normal.  Enlarged  prostate. No nodules felt.   Musculoskeletal: Normal range of motion. He exhibits no edema or tenderness.  Lymphadenopathy:    He has no cervical adenopathy.  Neurological: He is alert and oriented to person, place, and time.  Skin: Skin is warm and dry. No rash noted. No erythema. No pallor.  Psychiatric: He has a normal mood and affect. His behavior is normal. Judgment and thought content normal.  Nursing note and vitals reviewed.      Assessment & Plan:  1. Routine general medical examination at a health care facility - Continue to eat healthy and start exercising. We spoke about low impact exercises such as swimming and elliptical.  - Follow up in one year for CPE - Follow up sooner if needed  2. Essential hypertension - Controlled on current medication - no change - EKG 12-Lead-Sinus  Rhythm  Voltage criteria for LVH  (S(V1)+R(V6) exceeds 3.50 mV)  -Voltage criteria w/o ST/T abnormality may be normal.   3. BPH (benign prostatic hyperplasia) - Ambulatory referral to Urology - tamsulosin (FLOMAX) 0.4 MG CAPS capsule; Take 1 capsule (0.4 mg total) by mouth daily.  Dispense: 30 capsule; Refill: 3 - Ambulatory referral to Urology - Follow up if no improvement in nocturia after starting Flomax

## 2015-05-17 NOTE — Patient Instructions (Signed)
It was great meeting you today!  As we talked about, start exercising and continue to eat healthy.   I have sent a prescription to the pharmacy for Flomax. Take as directed. Please let me know if it is not working

## 2015-07-21 ENCOUNTER — Other Ambulatory Visit: Payer: Self-pay | Admitting: Family Medicine

## 2015-09-22 ENCOUNTER — Ambulatory Visit (INDEPENDENT_AMBULATORY_CARE_PROVIDER_SITE_OTHER): Payer: BLUE CROSS/BLUE SHIELD | Admitting: Adult Health

## 2015-09-22 ENCOUNTER — Encounter: Payer: Self-pay | Admitting: Adult Health

## 2015-09-22 ENCOUNTER — Ambulatory Visit (INDEPENDENT_AMBULATORY_CARE_PROVIDER_SITE_OTHER): Payer: BLUE CROSS/BLUE SHIELD | Admitting: Family Medicine

## 2015-09-22 VITALS — BP 150/84 | Temp 98.1°F | Ht 72.0 in | Wt 242.7 lb

## 2015-09-22 DIAGNOSIS — I10 Essential (primary) hypertension: Secondary | ICD-10-CM | POA: Diagnosis not present

## 2015-09-22 DIAGNOSIS — R252 Cramp and spasm: Secondary | ICD-10-CM | POA: Diagnosis not present

## 2015-09-22 LAB — POCT URINALYSIS DIPSTICK
Bilirubin, UA: NEGATIVE
Blood, UA: NEGATIVE
Glucose, UA: NEGATIVE
KETONES UA: NEGATIVE
Leukocytes, UA: NEGATIVE
Nitrite, UA: NEGATIVE
PH UA: 5.5
Spec Grav, UA: 1.025
UROBILINOGEN UA: 0.2

## 2015-09-22 LAB — MAGNESIUM: MAGNESIUM: 1.8 mg/dL (ref 1.5–2.5)

## 2015-09-22 LAB — BASIC METABOLIC PANEL
BUN: 10 mg/dL (ref 6–23)
CHLORIDE: 103 meq/L (ref 96–112)
CO2: 28 meq/L (ref 19–32)
CREATININE: 1.11 mg/dL (ref 0.40–1.50)
Calcium: 9.4 mg/dL (ref 8.4–10.5)
GFR: 86.26 mL/min (ref >60.00)
Glucose, Bld: 85 mg/dL (ref 70–99)
Potassium: 3.7 mEq/L (ref 3.5–5.1)
SODIUM: 138 meq/L (ref 135–145)

## 2015-09-22 MED ORDER — CYCLOBENZAPRINE HCL 10 MG PO TABS: 10.0000 mg | ORAL_TABLET | Freq: Three times a day (TID) | ORAL | Status: DC | PRN

## 2015-09-22 MED ORDER — AMLODIPINE BESYLATE 5 MG PO TABS: 5.0000 mg | ORAL_TABLET | Freq: Every day | ORAL | Status: DC

## 2015-09-22 NOTE — Patient Instructions (Addendum)
It was great meeting you again.   I will follow up with you regarding your labs.   In the meantime, stay well hydrated. You can drink a Gatorade or two throughout the day.   Also watch the amount of salt you consume.  Stretch your calf muscles at night before going to bed.    Leg Cramps Leg cramps occur when a muscle or muscles tighten and you have no control over this tightening (involuntary muscle contraction). Muscle cramps can develop in any muscle, but the most common place is in the calf muscles of the leg. Those cramps can occur during exercise or when you are at rest. Leg cramps are painful, and they may last for a few seconds to a few minutes. Cramps may return several times before they finally stop. Usually, leg cramps are not caused by a serious medical problem. In many cases, the cause is not known. Some common causes include:  Overexertion.  Overuse from repetitive motions, or doing the same thing over and over.  Remaining in a certain position for a long period of time.  Improper preparation, form, or technique while performing a sport or an activity.  Dehydration.  Injury.  Side effects of some medicines.  Abnormally low levels of the salts and ions in your blood (electrolytes), especially potassium and calcium. These levels could be low if you are taking water pills (diuretics) or if you are pregnant. HOME CARE INSTRUCTIONS Watch your condition for any changes. Taking the following actions may help to lessen any discomfort that you are feeling:  Stay well-hydrated. Drink enough fluid to keep your urine clear or pale yellow.  Try massaging, stretching, and relaxing the affected muscle. Do this for several minutes at a time.  For tight or tense muscles, use a warm towel, heating pad, or hot shower water directed to the affected area.  If you are sore or have pain after a cramp, applying ice to the affected area may relieve discomfort.  Put ice in a plastic  bag.  Place a towel between your skin and the bag.  Leave the ice on for 20 minutes, 2-3 times per day.  Avoid strenuous exercise for several days if you have been having frequent leg cramps.  Make sure that your diet includes the essential minerals for your muscles to work normally.  Take medicines only as directed by your health care provider. SEEK MEDICAL CARE IF:  Your leg cramps get more severe or more frequent, or they do not improve over time.  Your foot becomes cold, numb, or blue.   This information is not intended to replace advice given to you by your health care provider. Make sure you discuss any questions you have with your health care provider.   Document Released: 11/23/2004 Document Revised: 03/02/2015 Document Reviewed: 09/23/2014 Elsevier Interactive Patient Education [employer].

## 2015-09-22 NOTE — Progress Notes (Signed)
Subjective:    Patient ID: Jacob Quinn, male    DOB: 06/26/1955, 59 y.o.   MRN: [id number]  HPI  59 year old male who presents to the office today for muscle cramps that happen mostly at night. This has been an ongoing issue for months. The cramps have been getting worse over the last 2 weeks. He states " They get so bad that I feel like I am going to fall down." The cramps are usually in the calf and radiate into the thigh. The cramps last from a few seconds to 15 minutes.   He does not drink a lot of fluids throughout the day and his diet recently consists of fast food.   He endorses that heating pads help the pain.   Also needs a refill of his Norvasc  Review of Systems  Constitutional: Negative.   Respiratory: Negative.   Cardiovascular: Negative.   Gastrointestinal: Negative.   Musculoskeletal: Positive for myalgias. Negative for back pain, joint swelling, arthralgias, gait problem, neck pain and neck stiffness.  Skin: Negative.   Neurological: Negative.   All other systems reviewed and are negative.  Past Medical History  Diagnosis Date  . Rheumatic fever   . Hypertension   . OA (osteoarthritis)   . Encephalitis, viral   . GERD (gastroesophageal reflux disease)   . Syncope     Social History   Social History  . Marital Status: Single    Spouse Name: N/A  . Number of Children: 3  . Years of Education: N/A   Occupational History  . Textiles     [name]   Social History Main Topics  . Smoking status: Never Smoker   . Smokeless tobacco: Never Used  . Alcohol Use: No  . Drug Use: No  . Sexual Activity: Yes   Other Topics Concern  . Not on file   Social History Narrative    Past Surgical History  Procedure Laterality Date  . Replacement total knee      left and right  . Tonsillectomy      Family History  Problem Relation Age of Onset  . Hypertension Mother   . Heart disease Mother   . Prostate cancer Father   . Depression Brother   .  Hypertension Brother   . Hypertension Brother     Allergies  Allergen Reactions  . Penicillins     Current Outpatient Prescriptions on File Prior to Visit  Medication Sig Dispense Refill  . ibuprofen (ADVIL,MOTRIN) 800 MG tablet     . lisinopril (PRINIVIL,ZESTRIL) 40 MG tablet     . sildenafil (VIAGRA) 100 MG tablet Take 0.5-1 tablets (50-100 mg total) by mouth daily as needed for erectile dysfunction. 90 tablet 3  . tamsulosin (FLOMAX) 0.4 MG CAPS capsule Take 1 capsule (0.4 mg total) by mouth daily. 30 capsule 3   No current facility-administered medications on file prior to visit.    BP 150/84 mmHg  Temp(Src) 98.1 F (36.7 C) (Oral)  Ht 6' (1.829 m)  Wt 242 lb 11.2 oz (110.088 kg)  BMI 32.91 kg/m2        Objective:   Physical Exam  Constitutional: He is oriented to person, place, and time. He appears well-developed and well-nourished. No distress.  Cardiovascular: Normal rate, regular rhythm, normal heart sounds and intact distal pulses.  Exam reveals no gallop and no friction rub.   No murmur heard. Pulmonary/Chest: Breath sounds normal. No respiratory distress. He has no wheezes. He has  no rales. He exhibits no tenderness.  Abdominal: Soft. Bowel sounds are normal. He exhibits no distension and no mass. There is no tenderness. There is no rebound and no guarding.  Musculoskeletal: Normal range of motion. He exhibits no edema or tenderness.  Neurological: He is alert and oriented to person, place, and time.  Skin: Skin is warm and dry. No rash noted. He is not diaphoretic. No erythema. No pallor.  Psychiatric: He has a normal mood and affect. His behavior is normal. Judgment and thought content normal.  Vitals reviewed.      Assessment & Plan:  1. Muscle cramps - Likely due to dehydration or electrolyte imbalance. He is not taking statin medication.  - Basic metabolic panel - Magnesium - POCT urinalysis dipstick - CK Total and CKMB - cyclobenzaprine (FLEXERIL)  10 MG tablet; Take 1 tablet (10 mg total) by mouth 3 (three) times daily as needed for muscle spasms.  Dispense: 30 tablet; Refill: 0  2. Essential hypertension - amLODipine (NORVASC) 5 MG tablet; Take 1 tablet (5 mg total) by mouth daily.  Dispense: 90 tablet; Refill: 3

## 2015-09-22 NOTE — Progress Notes (Signed)
Pre visit review using our clinic review tool, if applicable. No additional management support is needed unless otherwise documented below in the visit note. 

## 2015-09-23 LAB — CK TOTAL AND CKMB (NOT AT ARMC)
CK TOTAL: 373 U/L — AB (ref 7–232)
CK, MB: 3.3 ng/mL (ref 0.0–5.0)
RELATIVE INDEX: 0.9 (ref 0.0–4.0)

## 2015-10-31 DIAGNOSIS — I219 Acute myocardial infarction, unspecified: Secondary | ICD-10-CM

## 2015-10-31 HISTORY — DX: Acute myocardial infarction, unspecified: I21.9

## 2016-07-12 ENCOUNTER — Encounter: Payer: Self-pay | Admitting: Adult Health

## 2016-07-12 ENCOUNTER — Ambulatory Visit (INDEPENDENT_AMBULATORY_CARE_PROVIDER_SITE_OTHER): Payer: BLUE CROSS/BLUE SHIELD | Admitting: Adult Health

## 2016-07-12 VITALS — BP 152/88 | Temp 98.6°F | Ht 72.0 in | Wt 249.3 lb

## 2016-07-12 DIAGNOSIS — H9193 Unspecified hearing loss, bilateral: Secondary | ICD-10-CM

## 2016-07-12 NOTE — Progress Notes (Signed)
Subjective:    Patient ID: Jacob Quinn, male    DOB: 06/26/1955, 60 y.o.   MRN: [id number]  HPI  60 year old male who presents to the office today for hearing difficulty. He reports that for an unknown amount of time he has been noticing a ringing in his ears and feels as though voices and sounds and muffled. He denies any drainage or pain. His symptoms are presents in both ears.  Reports " I feel like they are full of wax"  He does report working around loud equipment but does wear hearing protection.   Denies any other complaints at this time.   Review of Systems  Constitutional: Negative.   HENT: Positive for hearing loss and tinnitus. Negative for congestion, ear discharge, ear pain, nosebleeds, postnasal drip, rhinorrhea and sinus pressure.   Respiratory: Negative.   Cardiovascular: Negative.   Neurological: Negative.   All other systems reviewed and are negative.  Past Medical History:  Diagnosis Date  . Encephalitis, viral   . GERD (gastroesophageal reflux disease)   . Hypertension   . OA (osteoarthritis)   . Rheumatic fever   . Syncope     Social History   Social History  . Marital status: Single    Spouse name: N/A  . Number of children: 3  . Years of education: N/A   Occupational History  . [employer]   Social History Main Topics  . Smoking status: Never Smoker  . Smokeless tobacco: Never Used  . Alcohol use No  . Drug use: No  . Sexual activity: Yes   Other Topics Concern  . Not on file   Social History Narrative  . No narrative on file    Past Surgical History:  Procedure Laterality Date  . REPLACEMENT TOTAL KNEE     left and right  . TONSILLECTOMY      Family History  Problem Relation Age of Onset  . Hypertension Mother   . Heart disease Mother   . Prostate cancer Father   . Depression Brother   . Hypertension Brother   . Hypertension Brother     Allergies  Allergen Reactions  .  Penicillins     Current Outpatient Prescriptions on File Prior to Visit  Medication Sig Dispense Refill  . amLODipine (NORVASC) 5 MG tablet Take 1 tablet (5 mg total) by mouth daily. 90 tablet 3  . cyclobenzaprine (FLEXERIL) 10 MG tablet Take 1 tablet (10 mg total) by mouth 3 (three) times daily as needed for muscle spasms. 30 tablet 0  . ibuprofen (ADVIL,MOTRIN) 800 MG tablet     . lisinopril (PRINIVIL,ZESTRIL) 40 MG tablet     . sildenafil (VIAGRA) 100 MG tablet Take 0.5-1 tablets (50-100 mg total) by mouth daily as needed for erectile dysfunction. 90 tablet 3  . tamsulosin (FLOMAX) 0.4 MG CAPS capsule Take 1 capsule (0.4 mg total) by mouth daily. 30 capsule 3   No current facility-administered medications on file prior to visit.     BP (!) 152/88   Temp 98.6 F (37 C) (Oral)   Ht 6' (1.829 m)   Wt 249 lb 4.8 oz (113.1 kg)   BMI 33.81 kg/m       Objective:   Physical Exam  Constitutional: He is oriented to person, place, and time. He appears well-developed and well-nourished. No distress.  HENT:  Head: Normocephalic and atraumatic.  Right Ear: Hearing, tympanic membrane, external ear and  ear canal normal. No drainage, swelling or tenderness. Tympanic membrane is not scarred, not erythematous, not retracted and not bulging.  Left Ear: Hearing, tympanic membrane, external ear and ear canal normal. No drainage, swelling or tenderness. Tympanic membrane is not scarred, not erythematous, not retracted and not bulging.  Nose: Nose normal.  Mouth/Throat: Oropharynx is clear and moist. No oropharyngeal exudate.  Eyes: Conjunctivae and EOM are normal. Pupils are equal, round, and reactive to light. Right eye exhibits no discharge. Left eye exhibits no discharge.  Cardiovascular: Normal rate, regular rhythm, normal heart sounds and intact distal pulses.  Exam reveals no gallop and no friction rub.   No murmur heard. Pulmonary/Chest: Effort normal and breath sounds normal. No respiratory  distress. He has no wheezes. He has no rales. He exhibits no tenderness.  Neurological: He is alert and oriented to person, place, and time.  Skin: Skin is warm and dry. No rash noted. He is not diaphoretic. No erythema. No pallor.  Psychiatric: He has a normal mood and affect. His behavior is normal. Thought content normal.  Nursing note and vitals reviewed.     Assessment & Plan:  1. Loss of hearing, bilateral - Negative whisper test - Ambulatory referral to Audiology for formal hearing evaluation  - Follow up as needed  Dorothyann Peng, NP

## 2016-07-15 ENCOUNTER — Other Ambulatory Visit: Payer: Self-pay | Admitting: Family Medicine

## 2016-08-03 ENCOUNTER — Telehealth: Payer: Self-pay | Admitting: Family Medicine

## 2016-08-03 NOTE — Telephone Encounter (Signed)
Noted will follow up with pt.

## 2016-08-03 NOTE — Telephone Encounter (Signed)
Patient Name: LARNELL SITZMAN DOB: 05/17/1956 Initial Comment Caller has chest pain when walking Nurse Assessment Nurse: Ronnald Ramp, RN, Miranda Date/Time (Eastern Time): 08/03/2016 10:47:43 AM Confirm and document reason for call. If symptomatic, describe symptoms. You must click the next button to save text entered. ---Caller states he has been having pain in his chest when walking or exertion. Goes away when he sits down and rests. Not having pain at this moment. Has the patient traveled out of the country within the last 30 days? ---Not Applicable Does the patient have any new or worsening symptoms? ---Yes Will a triage be completed? ---Yes Related visit to physician within the last 2 weeks? ---No Does the PT have any chronic conditions? (i.e. diabetes, asthma, etc.) ---Yes List chronic conditions. ---HTN Is this a behavioral health or substance abuse call? ---No Guidelines Guideline Title Affirmed Question Affirmed Notes Chest Pain [1] Intermittent chest pain or "angina" AND [2] increasing in severity or frequency (Exception: pains lasting a few seconds) Final Disposition User Go to ED Now Ronnald Ramp, RN, [facility] - ED Disagree/Comply: Comply

## 2016-08-03 NOTE — Telephone Encounter (Signed)
Called and spoke with pt. Pt sates he is waiting on his ride and will go to the ED.

## 2016-08-03 NOTE — Telephone Encounter (Signed)
Called and spoke with pt. Pt has not yet arrived to ED. Pt states he will go when his friend gets off work. Pt states that the pain is on and off and is not experiencing any pain at this time. I informed pt that I was following up. Pt verbalized understanding.

## 2016-08-04 ENCOUNTER — Emergency Department (HOSPITAL_COMMUNITY)
Admission: EM | Admit: 2016-08-04 | Discharge: 2016-08-04 | Disposition: A | Discharge status: Home / Self Care | Payer: BLUE CROSS/BLUE SHIELD | Source: Home / Self Care | Attending: Emergency Medicine | Admitting: Emergency Medicine

## 2016-08-04 ENCOUNTER — Emergency Department (HOSPITAL_COMMUNITY): Payer: BLUE CROSS/BLUE SHIELD

## 2016-08-04 ENCOUNTER — Encounter (HOSPITAL_COMMUNITY): Payer: Self-pay | Admitting: Vascular Surgery

## 2016-08-04 DIAGNOSIS — I10 Essential (primary) hypertension: Secondary | ICD-10-CM | POA: Insufficient documentation

## 2016-08-04 DIAGNOSIS — I214 Non-ST elevation (NSTEMI) myocardial infarction: Secondary | ICD-10-CM | POA: Diagnosis not present

## 2016-08-04 DIAGNOSIS — Z96653 Presence of artificial knee joint, bilateral: Secondary | ICD-10-CM | POA: Insufficient documentation

## 2016-08-04 DIAGNOSIS — R0789 Other chest pain: Secondary | ICD-10-CM | POA: Insufficient documentation

## 2016-08-04 DIAGNOSIS — R079 Chest pain, unspecified: Secondary | ICD-10-CM | POA: Diagnosis not present

## 2016-08-04 LAB — I-STAT TROPONIN, ED: Troponin i, poc: 0.01 ng/mL (ref 0.00–0.08)

## 2016-08-04 LAB — CBC
HEMATOCRIT: 40.9 % (ref 39.0–52.0)
HEMOGLOBIN: 13.3 g/dL (ref 13.0–17.0)
MCH: 29.2 pg (ref 26.0–34.0)
MCHC: 32.5 g/dL (ref 30.0–36.0)
MCV: 89.7 fL (ref 78.0–100.0)
Platelets: 265 10*3/uL (ref 150–400)
RBC: 4.56 MIL/uL (ref 4.22–5.81)
RDW: 13.2 % (ref 11.5–15.5)
WBC: 8.8 10*3/uL (ref 4.0–10.5)

## 2016-08-04 LAB — BASIC METABOLIC PANEL
ANION GAP: 10 (ref 5–15)
BUN: 8 mg/dL (ref 6–20)
CHLORIDE: 100 mmol/L — AB (ref 101–111)
CO2: 25 mmol/L (ref 22–32)
Calcium: 9.2 mg/dL (ref 8.9–10.3)
Creatinine, Ser: 1.16 mg/dL (ref 0.61–1.24)
GFR calc non Af Amer: >60 mL/min (ref >60)
Glucose, Bld: 126 mg/dL — ABNORMAL HIGH (ref 65–99)
Potassium: 3.7 mmol/L (ref 3.5–5.1)
Sodium: 135 mmol/L (ref 135–145)

## 2016-08-04 MED ORDER — LISINOPRIL 10 MG PO TABS
10.0000 mg | ORAL_TABLET | Freq: Once | ORAL | Status: AC
  Administered 2016-08-04: 10 mg via ORAL
  Filled 2016-08-04: qty 1

## 2016-08-04 MED ORDER — LISINOPRIL 20 MG PO TABS: 20.0000 mg | ORAL_TABLET | Freq: Every day | ORAL | 0 refills | Status: DC

## 2016-08-04 MED ORDER — ESOMEPRAZOLE MAGNESIUM 40 MG PO CPDR: 40.0000 mg | DELAYED_RELEASE_CAPSULE | Freq: Every day | ORAL | 0 refills | Status: DC

## 2016-08-04 NOTE — ED Notes (Signed)
Patient Alert and oriented X4. Stable and ambulatory. Patient verbalized understanding of the discharge instructions.  Patient belongings were taken by the patient.  

## 2016-08-04 NOTE — Discharge Instructions (Signed)
Take nexium daily.   Take lisinopril as prescribed.   See your doctor. If symptoms not better, see GI doctor.   Return to ER if you have worse chest pain, shortness of breath, abdominal pain

## 2016-08-04 NOTE — Telephone Encounter (Signed)
FYI.  Pt seen in ED 

## 2016-08-04 NOTE — ED Triage Notes (Signed)
Pt reports to the ED for eval of chest pain and burning x 3-4 weeks. States he has noticed it has been getting worse so he decided to come get it checked out. Walking/activity makes the pain worse. Rest makes the pain feel better. Denies SOB, N/V, diaphoresis, lightheadedness, or dizziness.

## 2016-08-04 NOTE — ED Provider Notes (Addendum)
[facility] DEPT Provider Note   CSN: [id number] Arrival date & time: 08/04/16  1130     History   Chief Complaint Chief Complaint  Patient presents with  . Chest Pain    HPI Jacob Quinn is a 60 y.o. male hx of GERD, HTN, here with Chest pain. Intermittent substernal chest pain that is burning sensation for several weeks. States that it is worse after eating and is worse when he walks. Denies any shortness of breath or diaphoresis. Patient denies any history of cardiac disease. Eyes any recent travel. Patient ran out of his lisinopril 2 days ago and has not been taking his blood pressure.     The history is provided by the patient.    Past Medical History:  Diagnosis Date  . Encephalitis, viral   . GERD (gastroesophageal reflux disease)   . Hypertension   . OA (osteoarthritis)   . Rheumatic fever   . Syncope     Patient Active Problem List   Diagnosis Date Noted  . Acute renal failure ([place]) 01/30/2015  . AKI (acute kidney injury) ([place]) 01/30/2015  . UNSPECIFIED VENEREAL DISEASE 11/29/2010  . RESTLESS LEG SYNDROME 09/16/2010  . NOCTURIA 07/09/2009  . VIRAL URI 08/17/2008  . GERD 04/02/2008  . ERECTILE DYSFUNCTION, MILD 01/20/2008  . Essential hypertension 11/22/2007  . OSTEOARTHRITIS 11/22/2007    Past Surgical History:  Procedure Laterality Date  . REPLACEMENT TOTAL KNEE     left and right  . TONSILLECTOMY         Home Medications    Prior to Admission medications   Medication Sig Start Date End Date Taking? Authorizing Provider  amLODipine (NORVASC) 5 MG tablet Take 1 tablet (5 mg total) by mouth daily. 09/22/15   Dorothyann Peng, NP  cyclobenzaprine (FLEXERIL) 10 MG tablet Take 1 tablet (10 mg total) by mouth 3 (three) times daily as needed for muscle spasms. 09/22/15   Dorothyann Peng, NP  ibuprofen (ADVIL,MOTRIN) 800 MG tablet  04/24/15   Historical Provider, MD  lisinopril (PRINIVIL,ZESTRIL) 40 MG tablet TAKE 2 TABLETS EVERY MORNING 07/17/16    Dorena Cookey, MD  sildenafil (VIAGRA) 100 MG tablet Take 0.5-1 tablets (50-100 mg total) by mouth daily as needed for erectile dysfunction. 04/14/14   Dorena Cookey, MD  tamsulosin (FLOMAX) 0.4 MG CAPS capsule Take 1 capsule (0.4 mg total) by mouth daily. 05/17/15   Dorothyann Peng, NP    Family History Family History  Problem Relation Age of Onset  . Hypertension Mother   . Heart disease Mother   . Prostate cancer Father   . Depression Brother   . Hypertension Brother   . Hypertension Brother     Social History Social History  Substance Use Topics  . Smoking status: Never Smoker  . Smokeless tobacco: Never Used  . Alcohol use No     Allergies   Penicillins   Review of Systems Review of Systems  Cardiovascular: Positive for chest pain.  All other systems reviewed and are negative.    Physical Exam Updated Vital Signs BP 142/87   Pulse (!) 51   Temp 97.6 F (36.4 C) (Oral)   Resp 10   SpO2 99%   Physical Exam  Constitutional: He is oriented to person, place, and time. He appears well-developed and well-nourished.  HENT:  Head: Normocephalic.  Eyes: Pupils are equal, round, and reactive to light.  Neck: Normal range of motion. Neck supple.  Cardiovascular: Normal rate, regular rhythm and normal heart sounds.  Pulmonary/Chest: Effort normal and breath sounds normal. No respiratory distress. He has no wheezes. He has no rales.  Abdominal: Soft. Bowel sounds are normal. He exhibits no distension. There is no tenderness. There is no guarding.  Musculoskeletal: Normal range of motion.  Neurological: He is alert and oriented to person, place, and time.  Skin: Skin is warm.  Psychiatric: He has a normal mood and affect.  Nursing note and vitals reviewed.    ED Treatments / Results  Labs (all labs ordered are listed, but only abnormal results are displayed) Labs Reviewed  BASIC METABOLIC PANEL - Abnormal; Notable for the following:       Result Value    Chloride 100 (*)    Glucose, Bld 126 (*)    All other components within normal limits  CBC  I-STAT TROPOININ, ED    EKG  EKG Interpretation  Date/Time:  Friday August 04 2016 11:35:19 EDT Ventricular Rate:  68 PR Interval:  210 QRS Duration: 86 QT Interval:  368 QTC Calculation: 391 R Axis:   11 Text Interpretation:  Sinus rhythm with 1st degree A-V block Possible Left atrial enlargement Left ventricular hypertrophy Abnormal ECG No significant change since last tracing Confirmed by YAO  MD, DAVID ([id number]) on 08/04/2016 2:07:26 PM       Radiology Dg Chest 2 View  Result Date: 08/04/2016 CLINICAL DATA:  Burning in the chest for approximately 2 weeks. EXAM: CHEST  2 VIEW COMPARISON:  PA and lateral chest 01/30/2015 and 10/15/2007. FINDINGS: The lungs are clear. Heart size is upper normal. There is no pneumothorax or pleural effusion. Aortic atherosclerosis is noted. IMPRESSION: No acute disease. Atherosclerosis. Electronically Signed   By: Inge Rise M.D.   On: 08/04/2016 12:11    Procedures Procedures (including critical care time)  Medications Ordered in ED Medications  lisinopril (PRINIVIL,ZESTRIL) tablet 10 mg (10 mg Oral Given 08/04/16 1401)     Initial Impression / Assessment and Plan / ED Course  I have reviewed the triage vital signs and the nursing notes.  Pertinent labs & imaging results that were available during my care of the patient were reviewed by me and considered in my medical decision making (see chart for details).  Clinical Course   CASPAR TOKARCZYK is a 60 y.o. male here with chest pain. Atypical chest pain for the last several weeks and not getting worse. Uncompliant with BP meds. Likely reflux, low suspicion for dissection even though he is hypertensive. Will get CXR and if showed no widened mediastinum, will not get CT. Will get labs, trop x 1. Will give home BP meds.   3:23 PM Labs unremarkable. CXR unremarkable. BP improved to 140s from  170s. Pain free. Will refill lisinopril and give nexium. Recommend GI follow up for endoscopy if nexium is not helping.     Final Clinical Impressions(s) / ED Diagnoses   Final diagnoses:  None    New Prescriptions New Prescriptions   No medications on file     Drenda Freeze, MD 08/04/16 [place] Yao, MD 08/04/16 1736

## 2016-08-04 NOTE — ED Notes (Signed)
Patient ambulated independently to the restroom.

## 2016-08-07 ENCOUNTER — Encounter (HOSPITAL_COMMUNITY)
Admission: EM | Disposition: A | Discharge status: Home / Self Care | Payer: Self-pay | Source: Home / Self Care | Attending: Cardiovascular Disease

## 2016-08-07 ENCOUNTER — Encounter (HOSPITAL_COMMUNITY): Payer: Self-pay

## 2016-08-07 ENCOUNTER — Emergency Department (HOSPITAL_COMMUNITY): Payer: BLUE CROSS/BLUE SHIELD

## 2016-08-07 ENCOUNTER — Inpatient Hospital Stay (HOSPITAL_COMMUNITY)
Admission: EM | Admit: 2016-08-07 | Discharge: 2016-08-08 | DRG: 247 | Disposition: A | Discharge status: Home / Self Care | Payer: BLUE CROSS/BLUE SHIELD | Attending: Cardiovascular Disease | Admitting: Cardiovascular Disease

## 2016-08-07 DIAGNOSIS — I25110 Atherosclerotic heart disease of native coronary artery with unstable angina pectoris: Secondary | ICD-10-CM | POA: Diagnosis present

## 2016-08-07 DIAGNOSIS — I10 Essential (primary) hypertension: Secondary | ICD-10-CM | POA: Insufficient documentation

## 2016-08-07 DIAGNOSIS — I252 Old myocardial infarction: Secondary | ICD-10-CM | POA: Diagnosis present

## 2016-08-07 DIAGNOSIS — I255 Ischemic cardiomyopathy: Secondary | ICD-10-CM

## 2016-08-07 DIAGNOSIS — I200 Unstable angina: Secondary | ICD-10-CM | POA: Diagnosis not present

## 2016-08-07 DIAGNOSIS — I119 Hypertensive heart disease without heart failure: Secondary | ICD-10-CM | POA: Diagnosis present

## 2016-08-07 DIAGNOSIS — I2510 Atherosclerotic heart disease of native coronary artery without angina pectoris: Secondary | ICD-10-CM | POA: Diagnosis not present

## 2016-08-07 DIAGNOSIS — Z6836 Body mass index (BMI) 36.0-36.9, adult: Secondary | ICD-10-CM

## 2016-08-07 DIAGNOSIS — E669 Obesity, unspecified: Secondary | ICD-10-CM

## 2016-08-07 DIAGNOSIS — K219 Gastro-esophageal reflux disease without esophagitis: Secondary | ICD-10-CM | POA: Diagnosis present

## 2016-08-07 DIAGNOSIS — I214 Non-ST elevation (NSTEMI) myocardial infarction: Secondary | ICD-10-CM | POA: Diagnosis present

## 2016-08-07 DIAGNOSIS — M1990 Unspecified osteoarthritis, unspecified site: Secondary | ICD-10-CM | POA: Diagnosis present

## 2016-08-07 DIAGNOSIS — Z9861 Coronary angioplasty status: Secondary | ICD-10-CM

## 2016-08-07 DIAGNOSIS — Z955 Presence of coronary angioplasty implant and graft: Secondary | ICD-10-CM

## 2016-08-07 DIAGNOSIS — Z79899 Other long term (current) drug therapy: Secondary | ICD-10-CM

## 2016-08-07 DIAGNOSIS — R079 Chest pain, unspecified: Secondary | ICD-10-CM | POA: Diagnosis present

## 2016-08-07 DIAGNOSIS — E785 Hyperlipidemia, unspecified: Secondary | ICD-10-CM

## 2016-08-07 HISTORY — DX: Benign prostatic hyperplasia with lower urinary tract symptoms: N40.1

## 2016-08-07 HISTORY — DX: Obesity, unspecified: E66.9

## 2016-08-07 HISTORY — PX: CARDIAC CATHETERIZATION: SHX172

## 2016-08-07 HISTORY — DX: Atherosclerotic heart disease of native coronary artery without angina pectoris: I25.10

## 2016-08-07 HISTORY — DX: Disorder of kidney and ureter, unspecified: N28.9

## 2016-08-07 HISTORY — DX: Ischemic cardiomyopathy: I25.5

## 2016-08-07 HISTORY — DX: Hyperlipidemia, unspecified: E78.5

## 2016-08-07 HISTORY — DX: Restless legs syndrome: G25.81

## 2016-08-07 HISTORY — DX: Nocturia: R35.1

## 2016-08-07 LAB — BASIC METABOLIC PANEL
ANION GAP: 11 (ref 5–15)
BUN: 9 mg/dL (ref 6–20)
CALCIUM: 9.7 mg/dL (ref 8.9–10.3)
CO2: 26 mmol/L (ref 22–32)
CREATININE: 1.08 mg/dL (ref 0.61–1.24)
Chloride: 98 mmol/L — ABNORMAL LOW (ref 101–111)
GFR calc Af Amer: >60 mL/min (ref >60)
GFR calc non Af Amer: >60 mL/min (ref >60)
GLUCOSE: 94 mg/dL (ref 65–99)
Potassium: 3.7 mmol/L (ref 3.5–5.1)
Sodium: 135 mmol/L (ref 135–145)

## 2016-08-07 LAB — LIPID PANEL
Cholesterol: 216 mg/dL — ABNORMAL HIGH (ref 0–200)
HDL: 41 mg/dL (ref >40)
LDL CALC: 156 mg/dL — AB (ref 0–99)
TRIGLYCERIDES: 96 mg/dL (ref <150)
Total CHOL/HDL Ratio: 5.3 RATIO
VLDL: 19 mg/dL (ref 0–40)

## 2016-08-07 LAB — TROPONIN I
Troponin I: 0.21 ng/mL (ref <0.03)
Troponin I: 0.21 ng/mL (ref <0.03)
Troponin I: 0.29 ng/mL (ref <0.03)

## 2016-08-07 LAB — TSH: TSH: 1.93 u[IU]/mL (ref 0.350–4.500)

## 2016-08-07 LAB — COMPREHENSIVE METABOLIC PANEL
ALT: 14 U/L — AB (ref 17–63)
AST: 19 U/L (ref 15–41)
Albumin: 3.8 g/dL (ref 3.5–5.0)
Alkaline Phosphatase: 73 U/L (ref 38–126)
Anion gap: 10 (ref 5–15)
BILIRUBIN TOTAL: 1.2 mg/dL (ref 0.3–1.2)
BUN: 10 mg/dL (ref 6–20)
CALCIUM: 9.6 mg/dL (ref 8.9–10.3)
CO2: 29 mmol/L (ref 22–32)
CREATININE: 1.13 mg/dL (ref 0.61–1.24)
Chloride: 99 mmol/L — ABNORMAL LOW (ref 101–111)
GFR calc Af Amer: >60 mL/min (ref >60)
Glucose, Bld: 91 mg/dL (ref 65–99)
Potassium: 3.8 mmol/L (ref 3.5–5.1)
Sodium: 138 mmol/L (ref 135–145)
TOTAL PROTEIN: 7.1 g/dL (ref 6.5–8.1)

## 2016-08-07 LAB — CBC
HCT: 43.4 % (ref 39.0–52.0)
HEMOGLOBIN: 14.2 g/dL (ref 13.0–17.0)
MCH: 29.1 pg (ref 26.0–34.0)
MCHC: 32.7 g/dL (ref 30.0–36.0)
MCV: 88.9 fL (ref 78.0–100.0)
Platelets: 294 10*3/uL (ref 150–400)
RBC: 4.88 MIL/uL (ref 4.22–5.81)
RDW: 13.1 % (ref 11.5–15.5)
WBC: 10.1 10*3/uL (ref 4.0–10.5)

## 2016-08-07 LAB — PROTIME-INR
INR: 1.04
PROTHROMBIN TIME: 13.6 s (ref 11.4–15.2)

## 2016-08-07 LAB — I-STAT TROPONIN, ED: TROPONIN I, POC: 0.14 ng/mL — AB (ref 0.00–0.08)

## 2016-08-07 LAB — HEPARIN LEVEL (UNFRACTIONATED): Heparin Unfractionated: 0.63 IU/mL (ref 0.30–0.70)

## 2016-08-07 LAB — POCT ACTIVATED CLOTTING TIME: ACTIVATED CLOTTING TIME: 599 s

## 2016-08-07 SURGERY — LEFT HEART CATH AND CORONARY ANGIOGRAPHY

## 2016-08-07 MED ORDER — ACETAMINOPHEN 325 MG PO TABS: 650.0000 mg | ORAL_TABLET | ORAL | Status: DC | PRN

## 2016-08-07 MED ORDER — LISINOPRIL 10 MG PO TABS
20.0000 mg | ORAL_TABLET | Freq: Every day | ORAL | Status: DC
Start: 2016-08-07 — End: 2016-08-08
  Administered 2016-08-07 – 2016-08-08 (×2): 20 mg via ORAL
  Filled 2016-08-07 (×2): qty 2

## 2016-08-07 MED ORDER — IOPAMIDOL (ISOVUE-370) INJECTION 76%
INTRAVENOUS | Status: AC
Start: 2016-08-07 — End: 2016-08-07
  Filled 2016-08-07: qty 50

## 2016-08-07 MED ORDER — METOPROLOL TARTRATE 5 MG/5ML IV SOLN
INTRAVENOUS | Status: DC | PRN
  Administered 2016-08-07: 2.5 mg via INTRAVENOUS

## 2016-08-07 MED ORDER — SODIUM CHLORIDE 0.9% FLUSH
3.0000 mL | Freq: Two times a day (BID) | INTRAVENOUS | Status: DC
  Administered 2016-08-07: 3 mL via INTRAVENOUS

## 2016-08-07 MED ORDER — FENTANYL CITRATE (PF) 100 MCG/2ML IJ SOLN
INTRAMUSCULAR | Status: DC | PRN
  Administered 2016-08-07: 50 ug via INTRAVENOUS
  Administered 2016-08-07: 25 ug via INTRAVENOUS

## 2016-08-07 MED ORDER — HEPARIN (PORCINE) IN NACL 2-0.9 UNIT/ML-% IJ SOLN
INTRAMUSCULAR | Status: DC | PRN
  Administered 2016-08-07: 10 mL via INTRA_ARTERIAL

## 2016-08-07 MED ORDER — TICAGRELOR 90 MG PO TABS
ORAL_TABLET | ORAL | Status: AC
  Filled 2016-08-07: qty 2

## 2016-08-07 MED ORDER — PANTOPRAZOLE SODIUM 40 MG PO TBEC
40.0000 mg | DELAYED_RELEASE_TABLET | Freq: Every day | ORAL | Status: DC
  Administered 2016-08-07 – 2016-08-08 (×2): 40 mg via ORAL
  Filled 2016-08-07 (×2): qty 1

## 2016-08-07 MED ORDER — SODIUM CHLORIDE 0.9 % WEIGHT BASED INFUSION: 3.0000 mL/kg/h | INTRAVENOUS | Status: DC

## 2016-08-07 MED ORDER — ASPIRIN 81 MG PO CHEW
324.0000 mg | CHEWABLE_TABLET | Freq: Once | ORAL | Status: AC
  Administered 2016-08-07: 324 mg via ORAL
  Filled 2016-08-07: qty 4

## 2016-08-07 MED ORDER — NITROGLYCERIN 0.4 MG SL SUBL
0.4000 mg | SUBLINGUAL_TABLET | SUBLINGUAL | Status: DC | PRN
  Filled 2016-08-07: qty 1

## 2016-08-07 MED ORDER — SODIUM CHLORIDE 0.9 % IV SOLN
INTRAVENOUS | Status: DC | PRN
  Administered 2016-08-07: 20 mL/h via INTRAVENOUS

## 2016-08-07 MED ORDER — NITROGLYCERIN IN D5W 200-5 MCG/ML-% IV SOLN
INTRAVENOUS | Status: AC
  Filled 2016-08-07: qty 250

## 2016-08-07 MED ORDER — LIDOCAINE HCL (PF) 1 % IJ SOLN
INTRAMUSCULAR | Status: AC
  Filled 2016-08-07: qty 30

## 2016-08-07 MED ORDER — ATORVASTATIN CALCIUM 80 MG PO TABS: 80.0000 mg | ORAL_TABLET | Freq: Every day | ORAL | Status: DC

## 2016-08-07 MED ORDER — AMLODIPINE BESYLATE 5 MG PO TABS
5.0000 mg | ORAL_TABLET | Freq: Every day | ORAL | Status: DC
  Administered 2016-08-07 – 2016-08-08 (×2): 5 mg via ORAL
  Filled 2016-08-07 (×3): qty 1

## 2016-08-07 MED ORDER — HEPARIN (PORCINE) IN NACL 2-0.9 UNIT/ML-% IJ SOLN
INTRAMUSCULAR | Status: DC | PRN
  Administered 2016-08-07: 1500 mL

## 2016-08-07 MED ORDER — SODIUM CHLORIDE 0.9 % WEIGHT BASED INFUSION: 1.0000 mL/kg/h | INTRAVENOUS | Status: DC

## 2016-08-07 MED ORDER — HEPARIN SODIUM (PORCINE) 1000 UNIT/ML IJ SOLN
INTRAMUSCULAR | Status: AC
  Filled 2016-08-07: qty 1

## 2016-08-07 MED ORDER — ASPIRIN EC 81 MG PO TBEC: 81.0000 mg | DELAYED_RELEASE_TABLET | Freq: Every day | ORAL | Status: DC

## 2016-08-07 MED ORDER — NITROGLYCERIN 1 MG/10 ML FOR IR/CATH LAB
INTRA_ARTERIAL | Status: DC | PRN
  Administered 2016-08-07 (×4): 200 ug via INTRACORONARY

## 2016-08-07 MED ORDER — IOPAMIDOL (ISOVUE-370) INJECTION 76%
INTRAVENOUS | Status: AC
  Filled 2016-08-07: qty 100

## 2016-08-07 MED ORDER — NITROGLYCERIN 0.4 MG SL SUBL
0.4000 mg | SUBLINGUAL_TABLET | SUBLINGUAL | Status: AC | PRN
  Administered 2016-08-07 (×3): 0.4 mg via SUBLINGUAL
  Filled 2016-08-07: qty 1

## 2016-08-07 MED ORDER — ACETAMINOPHEN 325 MG PO TABS
ORAL_TABLET | ORAL | Status: AC
  Filled 2016-08-07: qty 2

## 2016-08-07 MED ORDER — BIVALIRUDIN 250 MG IV SOLR
INTRAVENOUS | Status: AC
  Filled 2016-08-07: qty 250

## 2016-08-07 MED ORDER — METOPROLOL TARTRATE 5 MG/5ML IV SOLN
INTRAVENOUS | Status: AC
  Filled 2016-08-07: qty 5

## 2016-08-07 MED ORDER — MIDAZOLAM HCL 2 MG/2ML IJ SOLN
INTRAMUSCULAR | Status: AC
  Filled 2016-08-07: qty 2

## 2016-08-07 MED ORDER — IOPAMIDOL (ISOVUE-370) INJECTION 76%
INTRAVENOUS | Status: DC | PRN
  Administered 2016-08-07: 240 mL via INTRA_ARTERIAL

## 2016-08-07 MED ORDER — HEPARIN (PORCINE) IN NACL 2-0.9 UNIT/ML-% IJ SOLN
INTRAMUSCULAR | Status: AC
  Filled 2016-08-07: qty 1500

## 2016-08-07 MED ORDER — ONDANSETRON HCL 4 MG/2ML IJ SOLN: 4.0000 mg | Freq: Four times a day (QID) | INTRAMUSCULAR | Status: DC | PRN

## 2016-08-07 MED ORDER — ONDANSETRON HCL 4 MG/2ML IJ SOLN
4.0000 mg | Freq: Four times a day (QID) | INTRAMUSCULAR | Status: DC | PRN
  Administered 2016-08-07: 4 mg via INTRAVENOUS

## 2016-08-07 MED ORDER — NITROGLYCERIN 1 MG/10 ML FOR IR/CATH LAB
INTRA_ARTERIAL | Status: AC
  Filled 2016-08-07: qty 10

## 2016-08-07 MED ORDER — BIVALIRUDIN BOLUS VIA INFUSION - CUPID
INTRAVENOUS | Status: DC | PRN
  Administered 2016-08-07: 82.725 mg via INTRAVENOUS

## 2016-08-07 MED ORDER — HEPARIN BOLUS VIA INFUSION
4000.0000 [IU] | Freq: Once | INTRAVENOUS | Status: AC
  Administered 2016-08-07: 4000 [IU] via INTRAVENOUS
  Filled 2016-08-07: qty 4000

## 2016-08-07 MED ORDER — ONDANSETRON HCL 4 MG/2ML IJ SOLN
INTRAMUSCULAR | Status: AC
  Filled 2016-08-07: qty 2

## 2016-08-07 MED ORDER — DIAZEPAM 5 MG PO TABS: 5.0000 mg | ORAL_TABLET | Freq: Four times a day (QID) | ORAL | Status: DC | PRN

## 2016-08-07 MED ORDER — SODIUM CHLORIDE 0.9 % IV SOLN
INTRAVENOUS | Status: DC | PRN
  Administered 2016-08-07 (×2): 1.75 mg/kg/h via INTRAVENOUS

## 2016-08-07 MED ORDER — NITROGLYCERIN IN D5W 200-5 MCG/ML-% IV SOLN: 0.0000 ug/min | INTRAVENOUS | Status: DC

## 2016-08-07 MED ORDER — HEPARIN (PORCINE) IN NACL 100-0.45 UNIT/ML-% IJ SOLN
1400.0000 [IU]/h | INTRAMUSCULAR | Status: DC
  Administered 2016-08-07: 1400 [IU]/h via INTRAVENOUS
  Filled 2016-08-07: qty 250

## 2016-08-07 MED ORDER — SODIUM CHLORIDE 0.9% FLUSH: 3.0000 mL | INTRAVENOUS | Status: DC | PRN

## 2016-08-07 MED ORDER — ASPIRIN 81 MG PO CHEW: 81.0000 mg | CHEWABLE_TABLET | ORAL | Status: DC

## 2016-08-07 MED ORDER — TICAGRELOR 90 MG PO TABS
ORAL_TABLET | ORAL | Status: DC | PRN
  Administered 2016-08-07: 180 mg via ORAL

## 2016-08-07 MED ORDER — HEART ATTACK BOUNCING BOOK
Freq: Once | Status: AC
  Administered 2016-08-07: 23:00:00
  Filled 2016-08-07: qty 1

## 2016-08-07 MED ORDER — TICAGRELOR 90 MG PO TABS
90.0000 mg | ORAL_TABLET | Freq: Two times a day (BID) | ORAL | Status: DC
  Administered 2016-08-08: 06:00:00 90 mg via ORAL

## 2016-08-07 MED ORDER — ACETAMINOPHEN 325 MG PO TABS
650.0000 mg | ORAL_TABLET | ORAL | Status: DC | PRN
  Administered 2016-08-07: 650 mg via ORAL

## 2016-08-07 MED ORDER — ANGIOPLASTY BOOK
Freq: Once | Status: AC
  Administered 2016-08-07: 23:00:00
  Filled 2016-08-07: qty 1

## 2016-08-07 MED ORDER — ASPIRIN 81 MG PO CHEW
81.0000 mg | CHEWABLE_TABLET | Freq: Every day | ORAL | Status: DC
  Administered 2016-08-08: 81 mg via ORAL
  Filled 2016-08-07: qty 1

## 2016-08-07 MED ORDER — NITROGLYCERIN IN D5W 200-5 MCG/ML-% IV SOLN
INTRAVENOUS | Status: DC | PRN
  Administered 2016-08-07: 10 ug/min via INTRAVENOUS

## 2016-08-07 MED ORDER — SODIUM CHLORIDE 0.9 % IV SOLN: 250.0000 mL | INTRAVENOUS | Status: DC | PRN

## 2016-08-07 MED ORDER — SODIUM CHLORIDE 0.9 % IV SOLN: INTRAVENOUS | Status: DC

## 2016-08-07 MED ORDER — HEPARIN SODIUM (PORCINE) 1000 UNIT/ML IJ SOLN
INTRAMUSCULAR | Status: DC | PRN
  Administered 2016-08-07: 5000 [IU] via INTRAVENOUS

## 2016-08-07 MED ORDER — SODIUM CHLORIDE 0.9% FLUSH
3.0000 mL | Freq: Two times a day (BID) | INTRAVENOUS | Status: DC
  Administered 2016-08-08: 09:00:00 3 mL via INTRAVENOUS

## 2016-08-07 MED ORDER — LIDOCAINE HCL (PF) 1 % IJ SOLN
INTRAMUSCULAR | Status: DC | PRN
Start: 2016-08-07 — End: 2016-08-07
  Administered 2016-08-07: 2 mL

## 2016-08-07 MED ORDER — SILDENAFIL CITRATE 100 MG PO TABS: 50.0000 mg | ORAL_TABLET | Freq: Every day | ORAL | Status: DC | PRN

## 2016-08-07 MED ORDER — MIDAZOLAM HCL 2 MG/2ML IJ SOLN
INTRAMUSCULAR | Status: DC | PRN
  Administered 2016-08-07: 1 mg via INTRAVENOUS
  Administered 2016-08-07: 2 mg via INTRAVENOUS

## 2016-08-07 MED ORDER — TAMSULOSIN HCL 0.4 MG PO CAPS
0.4000 mg | ORAL_CAPSULE | Freq: Every day | ORAL | Status: DC
Start: 2016-08-07 — End: 2016-08-08
  Administered 2016-08-07 – 2016-08-08 (×2): 0.4 mg via ORAL
  Filled 2016-08-07 (×2): qty 1

## 2016-08-07 MED ORDER — ATORVASTATIN CALCIUM 40 MG PO TABS
40.0000 mg | ORAL_TABLET | Freq: Every day | ORAL | Status: DC
  Filled 2016-08-07: qty 1

## 2016-08-07 MED ORDER — FENTANYL CITRATE (PF) 100 MCG/2ML IJ SOLN
INTRAMUSCULAR | Status: AC
  Filled 2016-08-07: qty 2

## 2016-08-07 SURGICAL SUPPLY — 18 items
BALLN TREK RX 2.5X15 (BALLOONS) ×2
BALLN ~~LOC~~ TREK RX 3.25X12 (BALLOONS) ×2
BALLOON TREK RX 2.5X15 (BALLOONS) ×1 IMPLANT
BALLOON ~~LOC~~ TREK RX 3.25X12 (BALLOONS) ×1 IMPLANT
CATH INFINITI 5FR ANG PIGTAIL (CATHETERS) ×2 IMPLANT
CATH OPTITORQUE TIG 4.0 5F (CATHETERS) ×2 IMPLANT
CATH VISTA GUIDE 6FR XB3.5 (CATHETERS) ×2 IMPLANT
DEVICE RAD COMP TR BAND LRG (VASCULAR PRODUCTS) ×2 IMPLANT
GLIDESHEATH SLEND SS 6F .021 (SHEATH) ×2 IMPLANT
KIT ENCORE 26 ADVANTAGE (KITS) ×2 IMPLANT
KIT HEART LEFT (KITS) ×2 IMPLANT
PACK CARDIAC CATHETERIZATION (CUSTOM PROCEDURE TRAY) ×2 IMPLANT
STENT RESOLUTE INTEG 3.0X22 (Permanent Stent) ×2 IMPLANT
SYR MEDRAD MARK V 150ML (SYRINGE) ×2 IMPLANT
TRANSDUCER W/STOPCOCK (MISCELLANEOUS) ×2 IMPLANT
TUBING CIL FLEX 10 FLL-RA (TUBING) ×2 IMPLANT
WIRE PT2 MS 185 (WIRE) ×2 IMPLANT
WIRE SAFE-T 1.5MM-J .035X260CM (WIRE) ×2 IMPLANT

## 2016-08-07 NOTE — Progress Notes (Signed)
ANTICOAGULATION CONSULT NOTE - Follow Up Consult  Pharmacy Consult for Heparin Indication: chest pain/ACS  Allergies  Allergen Reactions  . Penicillins Anaphylaxis    Patient Measurements: Height: 6\' 1"  (185.4 cm) Weight: 243 lb 3.2 oz (110.3 kg) IBW/kg (Calculated) : 79.9 Heparin Dosing Weight: 103 kg  Vital Signs: Temp: 98 F (36.7 C) (10/09 1036) Temp Source: Oral (10/09 1036) BP: 140/87 (10/09 1036) Pulse Rate: 64 (10/09 1036)  Labs:  Recent Labs  08/07/16 0202 08/07/16 0815 08/07/16 1056  HGB 14.2  --   --   HCT 43.4  --   --   PLT 294  --   --   HEPARINUNFRC  --   --  0.63  CREATININE 1.08 1.13  --   TROPONINI  --  0.21*  --     Estimated Creatinine Clearance: 87.2 mL/min (by C-G formula based on SCr of 1.13 mg/dL).  Assessment:   Initial heparin level is therapeutic (0.63) on 1400 units/hr.   For cardiac cath later today.  Goal of Therapy:  Heparin level 0.3-0.7 units/ml Monitor platelets by anticoagulation protocol: Yes   Plan:   Continue heparin drip at 1400 units/hr.  Daily heparin level and CBC while on heparin.  Follow up post-cath.  Arty Baumgartner, [place] Pager: [phone] 08/07/2016,12:37 PM

## 2016-08-07 NOTE — H&P (Addendum)
History and Physical   Admit date: 08/07/2016 Name:  Jacob Quinn Medical record number: [id number] DOB/Age:  62/28/1956  60 y.o. male  Referring Physician:  [facility]  Primary Physician:   Todd   Chief complaint/reason for admission:  Chest pain  HPI:  This very nice 60 year old black male is admitted for unstable angina pectoris.  The patient has a prior history of hypertension and significant osteoarthritis.  He began to have substernal chest pressure usually with exertion but also some atypical features and was seen in the emergency room a couple of days ago.  He was treated with Nexium.  Since then he has been unable to walk or do any activity without chest discomfort.  He normally works 2 jobs and on his night job had progressive chest of sternal chest pressure and heaviness when he would exert himself relieved with rest.  The last episode occurred while he was pushing a cart at work tonight around 2 AM and he presented to the emergency room.  Troponin was mildly elevated.  EKG was nonischemic and he is admitted for further evaluation.  He normally has no PND, orthopnea or edema.  There is no premature family history of cardiac disease but mother did have a history of congestive heart failure.  On review of his previous CT scan done in 2013 he was noted to have coronary calcifications.  He also had an admission in 2016 when he had some transient acute renal failure related to use of dehydration and ACE inhibitor.  Creatinine has returned to normal.   Past Medical History:  Diagnosis Date  . BPH associated with nocturia   . CAD (coronary artery disease), native coronary artery 08/07/2016   Noted on CT scan in terms of coronary calcification  . Encephalitis, viral   . GERD (gastroesophageal reflux disease)   . Hyperlipidemia 08/07/2016  . Hypertension   . OA (osteoarthritis)   . Obesity (BMI 30-39.9) 08/07/2016  . Restless leg syndrome 09/16/2010  . Rheumatic fever   .  Syncope      Past Surgical History:  Procedure Laterality Date  . REPLACEMENT TOTAL KNEE     left and right  . TONSILLECTOMY     Allergies: is allergic to penicillins.   Medications: Prior to Admission medications   Medication Sig Start Date End Date Taking? Authorizing Provider  esomeprazole (NEXIUM) 40 MG capsule Take 1 capsule (40 mg total) by mouth daily. 08/04/16  Yes Drenda Freeze, MD  lisinopril (PRINIVIL,ZESTRIL) 20 MG tablet Take 1 tablet (20 mg total) by mouth daily. 08/04/16  Yes Drenda Freeze, MD  amLODipine (NORVASC) 5 MG tablet Take 1 tablet (5 mg total) by mouth daily. Patient not taking: Reported on 08/07/2016 09/22/15   Dorothyann Peng, NP  cyclobenzaprine (FLEXERIL) 10 MG tablet Take 1 tablet (10 mg total) by mouth 3 (three) times daily as needed for muscle spasms. Patient not taking: Reported on 08/07/2016 09/22/15   Dorothyann Peng, NP  lisinopril (PRINIVIL,ZESTRIL) 40 MG tablet TAKE 2 TABLETS EVERY MORNING Patient not taking: Reported on 08/07/2016 07/17/16   Dorena Cookey, MD  sildenafil (VIAGRA) 100 MG tablet Take 0.5-1 tablets (50-100 mg total) by mouth daily as needed for erectile dysfunction. Patient not taking: Reported on 08/07/2016 04/14/14   Dorena Cookey, MD  tamsulosin (FLOMAX) 0.4 MG CAPS capsule Take 1 capsule (0.4 mg total) by mouth daily. Patient not taking: Reported on 08/07/2016 05/17/15   Dorothyann Peng, NP   Family History:  Family Status  Relation Status  . Mother Deceased  . Father Deceased  . Brother   . Brother     Social History:   reports that he has never smoked. He has never used smokeless tobacco. He reports that he does not drink alcohol or use drugs.   Social History   Social History Narrative   Lives with daughter, works for [employer] and drives trucks part-time at night     Review of Systems: He has been mildly overweight.  No eye ear nose or throat complaints.  Occasional indigestion but current chest pain is different than  previous indigestion.  He has significant urinary urgency as well as frequency and nocturia.  He has erectile dysfunction and uses silendafil.  Significant arthritis of the knee with multiple surgeries including 2 knee replacements. Other than as noted above, the remainder of the review of systems is normal  Physical Exam: BP 131/84   Pulse 60   Temp 98.3 F (36.8 C) (Oral)   Resp 12   Ht 6\' 1"  (1.854 m)   Wt 108.9 kg (240 lb)   SpO2 97%   BMI 31.66 kg/m  General appearance: Pleasant mildly obese black male in no acute distress Head: Normocephalic, without obvious abnormality, atraumatic, Balding male hair pattern Eyes: conjunctivae/corneas clear. PERRL, EOM's intact. Fundi not examined  Neck: no adenopathy, no carotid bruit, no JVD and supple, symmetrical, trachea midline Lungs: clear to auscultation bilaterally Heart: regular rate and rhythm, S1, S2 normal, no murmur, click, rub or gallop Abdomen: soft, non-tender; bowel sounds normal; no masses,  no organomegaly Rectal: deferred Extremities: extremities normal, atraumatic, no cyanosis or edema Pulses: 2+ and symmetric Skin: Skin color, texture, turgor normal. No rashes or lesions Neurologic: Grossly normal  Labs: CBC  Recent Labs  08/07/16 0202  WBC 10.1  RBC 4.88  HGB 14.2  HCT 43.4  PLT 294  MCV 88.9  MCH 29.1  MCHC 32.7  RDW 13.1   CMP   Recent Labs  08/07/16 0202  NA 135  K 3.7  CL 98*  CO2 26  GLUCOSE 94  BUN 9  CREATININE 1.08  CALCIUM 9.7  GFRNONAA >60  GFRAA >60   Cardiac Panel (last 3 results) Troponin (Point of Care Test)  Recent Labs  08/07/16 0211  TROPIPOC 0.14*    EKG: Voltage for LVH, no ischemic ST changes  Radiology: No active cardiopulmonary disease   IMPRESSIONS: 1.  Unstable angina pectoris 2.  Hypertensive heart disease 3.  Obesity 4.  Osteoarthritis  PLAN: Intravenous heparin.  Keep nothing by mouth.  With abnormal troponin and progressive symptoms will need  catheterization.Cardiac catheterization was discussed with the patient fully including risks of myocardial infarction, death, stroke, bleeding, arrhythmia, dye allergy, renal insufficiency or bleeding.  The patient understands and is willing to proceed.  Possibility of intervention at the same time also discussed with patient and he understands and is agreeable to proceed   Signed: W. Doristine Church MD [facility] Cardiology  08/07/2016, 5:33 AM

## 2016-08-07 NOTE — ED Provider Notes (Signed)
[facility] DEPT Provider Note   CSN: [id number] Arrival date & time: 08/07/16  0143  By signing my name below, I, Jacob Quinn, attest that this documentation has been prepared under the direction and in the presence of Fatima Blank, MD.  Electronically Signed: Julien Nordmann, ED Scribe. 08/07/16. 3:11 AM.    History   Chief Complaint Chief Complaint  Patient presents with  . Chest Pain    The history is provided by the patient. No language interpreter was used.   HPI Comments: Jacob Quinn is a 60 y.o. male who has a PMhx of GERD, HTN, OA, presents to the Emergency Department complaining of intermittent, gradual worsening, moderate, non-radiating chest pain x 3 days ago. Pt has been having this chest pain for ~ 3 weeks but it has gotten progressively worse in the past week. He describes the pain as pressure feeling. Pt was seen on 10/7 for the same pain where was told it was acid reflux. Pt notes his pain is worse upon exertion and modifies when lying down. He has not taken any ASA. He reports having a stress test done ~ 8 years ago. No heart catheterization or stent placed. He denies hx of DM, hx of HTN, hx of high cholesterol, hx of DVT, leg swelling, recent surgery in last 4 weeks, or hx of recent travel. Pt is a non-smoker.   Past Medical History:  Diagnosis Date  . Encephalitis, viral   . GERD (gastroesophageal reflux disease)   . Hypertension   . OA (osteoarthritis)   . Rheumatic fever   . Syncope     Patient Active Problem List   Diagnosis Date Noted  . Acute renal failure ([place]) 01/30/2015  . AKI (acute kidney injury) ([place]) 01/30/2015  . UNSPECIFIED VENEREAL DISEASE 11/29/2010  . RESTLESS LEG SYNDROME 09/16/2010  . NOCTURIA 07/09/2009  . VIRAL URI 08/17/2008  . GERD 04/02/2008  . ERECTILE DYSFUNCTION, MILD 01/20/2008  . Essential hypertension 11/22/2007  . OSTEOARTHRITIS 11/22/2007    Past Surgical History:  Procedure Laterality Date  .  REPLACEMENT TOTAL KNEE     left and right  . TONSILLECTOMY         Home Medications    Prior to Admission medications   Medication Sig Start Date End Date Taking? Authorizing Provider  amLODipine (NORVASC) 5 MG tablet Take 1 tablet (5 mg total) by mouth daily. 09/22/15   Dorothyann Peng, NP  cyclobenzaprine (FLEXERIL) 10 MG tablet Take 1 tablet (10 mg total) by mouth 3 (three) times daily as needed for muscle spasms. 09/22/15   Dorothyann Peng, NP  esomeprazole (NEXIUM) 40 MG capsule Take 1 capsule (40 mg total) by mouth daily. 08/04/16   Drenda Freeze, MD  ibuprofen (ADVIL,MOTRIN) 800 MG tablet  04/24/15   Historical Provider, MD  lisinopril (PRINIVIL,ZESTRIL) 20 MG tablet Take 1 tablet (20 mg total) by mouth daily. 08/04/16   Drenda Freeze, MD  lisinopril (PRINIVIL,ZESTRIL) 40 MG tablet TAKE 2 TABLETS EVERY MORNING 07/17/16   Dorena Cookey, MD  sildenafil (VIAGRA) 100 MG tablet Take 0.5-1 tablets (50-100 mg total) by mouth daily as needed for erectile dysfunction. 04/14/14   Dorena Cookey, MD  tamsulosin (FLOMAX) 0.4 MG CAPS capsule Take 1 capsule (0.4 mg total) by mouth daily. 05/17/15   Dorothyann Peng, NP    Family History Family History  Problem Relation Age of Onset  . Hypertension Mother   . Heart disease Mother   . Prostate cancer Father   .  Depression Brother   . Hypertension Brother   . Hypertension Brother     Social History Social History  Substance Use Topics  . Smoking status: Never Smoker  . Smokeless tobacco: Never Used  . Alcohol use No     Allergies   Penicillins   Review of Systems Review of Systems  A complete 10 system review of systems was obtained and all systems are negative except as noted in the HPI and PMH.    Physical Exam Updated Vital Signs BP (!) 133/105 (BP Location: Left Arm)   Pulse 70   Temp 98.3 F (36.8 C) (Oral)   Resp 18   Ht 6\' 1"  (1.854 m)   Wt 240 lb (108.9 kg)   SpO2 97%   BMI 31.66 kg/m   Physical Exam    Constitutional: He is oriented to person, place, and time. He appears well-developed and well-nourished. No distress.  HENT:  Head: Normocephalic and atraumatic.  Nose: Nose normal.  Eyes: Conjunctivae and EOM are normal. Pupils are equal, round, and reactive to light. Right eye exhibits no discharge. Left eye exhibits no discharge. No scleral icterus.  Neck: Normal range of motion. Neck supple.  Cardiovascular: Normal rate and regular rhythm.  Exam reveals no gallop and no friction rub.   No murmur heard. Pulmonary/Chest: Effort normal and breath sounds normal. No stridor. No respiratory distress. He has no rales.  Abdominal: Soft. He exhibits no distension. There is no tenderness.  Musculoskeletal: He exhibits no edema or tenderness.  Neurological: He is alert and oriented to person, place, and time.  Skin: Skin is warm and dry. No rash noted. He is not diaphoretic. No erythema.  Psychiatric: He has a normal mood and affect.  Nursing note and vitals reviewed.    ED Treatments / Results  DIAGNOSTIC STUDIES: Oxygen Saturation is 97% on RA, normal by my interpretation.  COORDINATION OF CARE:  3:07 AM Discussed treatment plan with pt at bedside and pt agreed to plan.  Labs (all labs ordered are listed, but only abnormal results are displayed) Labs Reviewed  Randolm Idol, ED - Abnormal; Notable for the following:       Result Value   Troponin i, poc 0.14 (*)    All other components within normal limits  BASIC METABOLIC PANEL  CBC    EKG  EKG Interpretation  Date/Time:  Monday August 07 2016 01:48:18 EDT Ventricular Rate:  82 PR Interval:  200 QRS Duration: 86 QT Interval:  354 QTC Calculation: 413 R Axis:   6 Text Interpretation:  Normal sinus rhythm Left ventricular hypertrophy Abnormal ECG No significant change was found Confirmed by [facility] MD, Amenda Duclos ([id number]) on 08/07/2016 2:29:13 AM Also confirmed by [facility] MD, Luna Audia [id number]), editor [place]  CCT, [place]  ([id number])  on 08/07/2016 6:59:06 AM       Radiology Dg Chest 2 View  Result Date: 08/07/2016 CLINICAL DATA:  Chest pain. EXAM: CHEST  2 VIEW COMPARISON:  08/04/2016 FINDINGS: The cardiomediastinal contours are normal. Mild eventration of right hemidiaphragm unchanged. The lungs are clear. Pulmonary vasculature is normal. No consolidation, pleural effusion, or pneumothorax. No acute osseous abnormalities are seen. IMPRESSION: No active cardiopulmonary disease. Electronically Signed   By: Jeb Levering M.D.   On: 08/07/2016 02:29    Procedures Procedures (including critical care time)  CRITICAL CARE Performed by: Grayce Sessions Tyrek Lawhorn Total critical care time: 30 minutes Critical care time was exclusive of separately billable procedures and treating other patients. Critical care was necessary  to treat or prevent imminent or life-threatening deterioration. Critical care was time spent personally by me on the following activities: development of treatment plan with patient and/or surrogate as well as nursing, discussions with consultants, evaluation of patient's response to treatment, examination of patient, obtaining history from patient or surrogate, ordering and performing treatments and interventions, ordering and review of laboratory studies, ordering and review of radiographic studies, pulse oximetry and re-evaluation of patient's condition.   Medications Ordered in ED Medications  nitroGLYCERIN (NITROSTAT) SL tablet 0.4 mg (0.4 mg Sublingual Given 08/07/16 0333)  heparin ADULT infusion 100 units/mL (25000 units/281mL sodium chloride 0.45%) (1,400 Units/hr Intravenous Transfusing/Transfer 08/07/16 0603)  lisinopril (PRINIVIL,ZESTRIL) tablet 20 mg (not administered)  pantoprazole (PROTONIX) EC tablet 40 mg (not administered)  tamsulosin (FLOMAX) capsule 0.4 mg (not administered)  amLODipine (NORVASC) tablet 5 mg (not administered)  aspirin EC tablet 81 mg (not administered)   nitroGLYCERIN (NITROSTAT) SL tablet 0.4 mg (not administered)  acetaminophen (TYLENOL) tablet 650 mg (not administered)  ondansetron (ZOFRAN) injection 4 mg (not administered)  0.9% sodium chloride infusion (not administered)    Followed by  0.9% sodium chloride infusion (not administered)  sodium chloride flush (NS) 0.9 % injection 3 mL (not administered)  sodium chloride flush (NS) 0.9 % injection 3 mL (not administered)  0.9 %  sodium chloride infusion (not administered)  aspirin chewable tablet 81 mg (not administered)  atorvastatin (LIPITOR) tablet 40 mg (40 mg Oral Not Given 08/07/16 0545)  aspirin chewable tablet 324 mg (324 mg Oral Given 08/07/16 0318)  heparin bolus via infusion 4,000 Units (4,000 Units Intravenous Bolus from Bag 08/07/16 0452)     Initial Impression / Assessment and Plan / ED Course  I have reviewed the triage vital signs and the nursing notes.  Pertinent labs & imaging results that were available during my care of the patient were reviewed by me and considered in my medical decision making (see chart for details).  Clinical Course    Exertional chest pain. EKG without acute ischemic changes. Chest x-ray without evidence suggestive of pneumonia, pneumothorax, pneumomediastinum.  No abnormal contour of the mediastinum to suggest dissection. No evidence of acute injuries. Initial troponin positive. Patient given 324 of aspirin, nitroglycerin and started on a heparin drip.  Low suspicion for pulmonary embolism. Presentation is classic for aortic dissection or esophageal perforation.  Consulted cardiology who will admit the patient for continued workup and management.   I personally performed the services described in this documentation, which was scribed in my presence. The recorded information has been reviewed and is accurate.    Final Clinical Impressions(s) / ED Diagnoses   Final diagnoses:  NSTEMI (non-ST elevated myocardial infarction) ([place])     Disposition: Admit  Condition: stable    Fatima Blank, MD 08/07/16 [id number]

## 2016-08-07 NOTE — Interval H&P Note (Signed)
Cath Lab Visit (complete for each Cath Lab visit)  Clinical Evaluation Leading to the Procedure:   ACS: Yes.    Non-ACS:    Anginal Classification: CCS III  Anti-ischemic medical therapy: Maximal Therapy (2 or more classes of medications)  Non-Invasive Test Results: No non-invasive testing performed  Prior CABG: No previous CABG      History and Physical Interval Note:  08/07/2016 6:13 PM  Jacob Quinn  has presented today for surgery, with the diagnosis of cp  The various methods of treatment have been discussed with the patient and family. After consideration of risks, benefits and other options for treatment, the patient has consented to  Procedure(s): Left Heart Cath and Coronary Angiography (N/A) as a surgical intervention .  The patient's history has been reviewed, patient examined, no change in status, stable for surgery.  I have reviewed the patient's chart and labs.  Questions were answered to the patient's satisfaction.     Shelva Majestic

## 2016-08-07 NOTE — Progress Notes (Signed)
Croitoru MD notified of critical lab value troponin 0.21. Patient resting quietly in bed  No new orders received will continue to monitor

## 2016-08-07 NOTE — ED Triage Notes (Signed)
Pt states that he has been having central cp since Friday, was seen here for it and told it was acid reflux, pain has not gotten better. No radiation, denies other cardiac symptoms.

## 2016-08-07 NOTE — Progress Notes (Signed)
ANTICOAGULATION CONSULT NOTE - Initial Consult  Pharmacy Consult for Heparin Indication: chest pain/ACS  Allergies  Allergen Reactions  . Penicillins Anaphylaxis    Patient Measurements: Height: 6\' 1"  (185.4 cm) Weight: 240 lb (108.9 kg) IBW/kg (Calculated) : 79.9 Heparin Dosing Weight: 103 kg  Vital Signs: Temp: 98.3 F (36.8 C) (10/09 0156) Temp Source: Oral (10/09 0156) BP: 155/99 (10/09 0400) Pulse Rate: 73 (10/09 0400)  Labs:  Recent Labs  08/04/16 1140 08/07/16 0202  HGB 13.3 14.2  HCT 40.9 43.4  PLT 265 294  CREATININE 1.16 1.08    Estimated Creatinine Clearance: 90.6 mL/min (by C-G formula based on SCr of 1.08 mg/dL).   Medical History: Past Medical History:  Diagnosis Date  . Encephalitis, viral   . GERD (gastroesophageal reflux disease)   . Hypertension   . OA (osteoarthritis)   . Rheumatic fever   . Syncope     Medications:  See electronic med rec  Assessment: 60 y.o. M presents with CP. To begin heparin for r/o ACS. No AC PTA. CBC ok on admission.  Goal of Therapy:  Heparin level 0.3-0.7 units/ml Monitor platelets by anticoagulation protocol: Yes   Plan:  Heparin IV bolus 4000 units Heparin gtt at 1400 units/hr Will f/u heparin level in 6 hours Daily heparin level and CBC  Sherlon Handing, PharmD, BCPS Clinical pharmacist, pager [phone] 08/07/2016,4:20 AM

## 2016-08-08 ENCOUNTER — Encounter (HOSPITAL_COMMUNITY): Payer: Self-pay | Admitting: Cardiovascular Disease

## 2016-08-08 DIAGNOSIS — I200 Unstable angina: Secondary | ICD-10-CM

## 2016-08-08 DIAGNOSIS — I10 Essential (primary) hypertension: Secondary | ICD-10-CM | POA: Insufficient documentation

## 2016-08-08 DIAGNOSIS — I255 Ischemic cardiomyopathy: Secondary | ICD-10-CM

## 2016-08-08 LAB — CBC
HCT: 37.8 % — ABNORMAL LOW (ref 39.0–52.0)
Hemoglobin: 12.2 g/dL — ABNORMAL LOW (ref 13.0–17.0)
MCH: 28.8 pg (ref 26.0–34.0)
MCHC: 32.3 g/dL (ref 30.0–36.0)
MCV: 89.2 fL (ref 78.0–100.0)
PLATELETS: 254 10*3/uL (ref 150–400)
RBC: 4.24 MIL/uL (ref 4.22–5.81)
RDW: 13.2 % (ref 11.5–15.5)
WBC: 9.4 10*3/uL (ref 4.0–10.5)

## 2016-08-08 LAB — BASIC METABOLIC PANEL
ANION GAP: 8 (ref 5–15)
BUN: 10 mg/dL (ref 6–20)
CALCIUM: 8.8 mg/dL — AB (ref 8.9–10.3)
CO2: 25 mmol/L (ref 22–32)
CREATININE: 1.05 mg/dL (ref 0.61–1.24)
Chloride: 103 mmol/L (ref 101–111)
Glucose, Bld: 87 mg/dL (ref 65–99)
Potassium: 3.9 mmol/L (ref 3.5–5.1)
Sodium: 136 mmol/L (ref 135–145)

## 2016-08-08 MED ORDER — ASPIRIN 81 MG PO TBEC: 81.0000 mg | DELAYED_RELEASE_TABLET | Freq: Every day | ORAL | 11 refills | Status: DC

## 2016-08-08 MED ORDER — AMLODIPINE BESYLATE 10 MG PO TABS
10.0000 mg | ORAL_TABLET | Freq: Every day | ORAL | Status: DC
Start: 2016-08-09 — End: 2016-08-08

## 2016-08-08 MED ORDER — AMLODIPINE BESYLATE 10 MG PO TABS: 10.0000 mg | ORAL_TABLET | Freq: Every day | ORAL | 6 refills | Status: DC

## 2016-08-08 MED ORDER — NITROGLYCERIN 0.4 MG SL SUBL
0.4000 mg | SUBLINGUAL_TABLET | SUBLINGUAL | 3 refills | Status: AC | PRN
Start: 2016-08-08 — End: ?

## 2016-08-08 MED ORDER — AMLODIPINE BESYLATE 5 MG PO TABS
5.0000 mg | ORAL_TABLET | Freq: Once | ORAL | Status: AC
  Administered 2016-08-08: 5 mg via ORAL

## 2016-08-08 MED ORDER — ASPIRIN EC 81 MG PO TBEC: 81.0000 mg | DELAYED_RELEASE_TABLET | Freq: Every day | ORAL | Status: DC

## 2016-08-08 MED ORDER — ATORVASTATIN CALCIUM 80 MG PO TABS: 80.0000 mg | ORAL_TABLET | Freq: Every evening | ORAL | 6 refills | Status: DC

## 2016-08-08 MED ORDER — TICAGRELOR 90 MG PO TABS: 90.0000 mg | ORAL_TABLET | Freq: Two times a day (BID) | ORAL | 3 refills | Status: DC

## 2016-08-08 NOTE — Discharge Summary (Signed)
Discharge Summary    Patient ID: Jacob Quinn,  MRN: [id number], DOB/AGE: 62/28/1956 62 y.o.  Admit date: 08/07/2016 Discharge date: 08/08/2016  Primary Care Provider: TODD,JEFFREY Quinn Primary Cardiologist: Jacob Quinn  Discharge Diagnoses    Principal Problem:   NSTEMI (non-ST elevated myocardial infarction) [facility]) Active Problems:   CAD (coronary artery disease), native coronary artery   Obesity (BMI 30-39.9)   Hyperlipidemia   Essential hypertension   Cardiomyopathy, ischemic    Diagnostic Studies/Procedures    1. Cardiac catheterization this admission, please see full report and below for summary.  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 Upon arrival to the ER his troponin was mildly elevated - peaked at 0.29 c/w NSTEMI. He was placed on IV heparin for ACS. Underwent LHC 08/07/16 showing 70% D1, 20% mLAD, segmental 95% then 75% OM stenosis, 85% acute marg, normal LVEDP - received PTCA/DES to OM, EF 50% - mild mid anterolateral focal hypocontractility felt to be due to the circumflex stenosis. Started on Brilinta, high dose Lipitor in addition to pre-existing ACEI and amlodipine (but had not been taking amlodipine at home). LDL 156, baseline LFTs OK. BB not started due to baseline HR 50s-60s. Blood pressure was elevated during this admission and into this morning so amlodipine was increased to 10mg  daily today (will follow BP this AM with this addition). He was instructed to follow BP at home and call if tending to run >140/90. Received Brilinta 30 day free card and regular rx. Dr. Percival Quinn has seen and examined the patient today and feels he is stable for discharge. TOC f/u  scheduled with help of Jacob Quinn for 10/19 at 1:30.  If the patient is tolerating statin at time of follow-up appointment, would consider rechecking liver function/lipid panel in 6-8 weeks. _____________  Discharge Vitals Blood pressure (!) 153/96, pulse 63, temperature 97.7 F (36.5 C), temperature source Oral, resp. rate 12, height 6\' 1"  (1.854 m), weight 243 lb 2.7 oz (110.3 kg), SpO2 97 %.  Filed Weights   08/07/16 0637 08/07/16 2146 08/08/16 0642  Weight: 243 lb 3.2 oz (110.3 kg) 243 lb 2.7 oz (110.3 kg) 243 lb 2.7 oz (110.3 kg)    Labs & Radiologic Studies    CBC  Recent Labs  08/07/16 0202 08/08/16 0539  WBC 10.1 9.4  HGB 14.2 12.2*  HCT 43.4 37.8*  MCV 88.9 89.2  PLT 294 0000000   Basic Metabolic Panel  Recent Labs  08/07/16 0815 08/08/16 0539  NA 138 136  K 3.8 3.9  CL 99* 103  CO2 29 25  GLUCOSE 91 87  BUN 10 10  CREATININE 1.13 1.05  CALCIUM 9.6 8.8*   Liver Function Tests  Recent Labs  08/07/16 0815  AST 19  ALT 14*  ALKPHOS 73  BILITOT 1.2  PROT 7.1  ALBUMIN 3.8   Cardiac Enzymes  Recent Labs  08/07/16 0815 08/07/16 1217 08/07/16 2203  TROPONINI 0.21* 0.21* 0.29*   Fasting Lipid Panel  Recent Labs  08/07/16 0815  CHOL 216*  HDL 41  LDLCALC 156*  TRIG 96  CHOLHDL 5.3   Thyroid Function Tests  Recent Labs  08/07/16 0815  TSH 1.930   _____________  Dg Chest 2  View  Result Date: 08/07/2016 CLINICAL DATA:  Chest pain. EXAM: CHEST  2 VIEW COMPARISON:  08/04/2016 FINDINGS: The cardiomediastinal contours are normal. Mild eventration of right hemidiaphragm unchanged. The lungs are clear. Pulmonary vasculature is normal. No consolidation, pleural effusion, or pneumothorax. No acute osseous abnormalities are seen. IMPRESSION: No active cardiopulmonary disease. Electronically Signed   By: Jacob Quinn M.D.   On: 08/07/2016 02:29   Dg Chest 2 View  Result Date: 08/04/2016 CLINICAL DATA:  Burning in the chest for approximately 2  weeks. EXAM: CHEST  2 VIEW COMPARISON:  PA and lateral chest 01/30/2015 and 10/15/2007. FINDINGS: The lungs are clear. Heart size is upper normal. There is no pneumothorax or pleural effusion. Aortic atherosclerosis is noted. IMPRESSION: No acute disease. Atherosclerosis. Electronically Signed   By: Inge Rise M.D.   On: 08/04/2016 12:11   Disposition   Pt is being discharged home today in good condition.  Follow-up Plans & Appointments    Follow-up Information    Jacob Quinn .   Specialties:  Cardiology, Radiology Why:  Follow up with Jacob Ferries, PA-C as below. Jacob Quinn is one of the PAs that works with Jacob Quinn. Contact information: [address] [place] [address] [phone]          Discharge Instructions    Amb Referral to Cardiac Rehabilitation    Complete by:  As directed    Diagnosis:   Coronary Stents NSTEMI     Diet - low sodium heart healthy    Complete by:  As directed    Increase activity slowly    Complete by:  As directed    No driving for 1 week. No lifting over 10 lbs for 2 weeks. No sexual activity for 2 weeks. You may return to work in 1 week if feeling well (if applicable). Keep procedure site clean & dry. If you notice increased pain, swelling, bleeding or pus, call/return!  You may shower, but no soaking baths/hot tubs/pools for 1 week.   Please monitor your blood pressure occasionally at home. Call your doctor if you tend to get readings of greater than 140 on the top number or 90 on the bottom number.  You had a previous prescription listed for sildenafil on your file (Viagra) - do not take this medicine within 48 hours of taking nitroglycerin or nitrate medications. The opposite is true as well - if you have taken any nitroglycerin-type medications in the last 48 hours, do not take any sildenafil. This can cause dangerously low blood pressure.      Discharge Medications     Medication List    STOP taking these  medications   cyclobenzaprine 10 MG tablet -  patient not taking prior to admission (old prescription) Commonly known as:  FLEXERIL   sildenafil 100 MG tablet - patient not taking prior to admission (old prescription) Commonly known as:  VIAGRA     TAKE these medications   amLODipine 10 MG tablet Commonly known as:  NORVASC Take 1 tablet (10 mg total) by mouth daily. What changed:  medication strength  how much to take   aspirin 81 MG EC tablet Take 1 tablet (81 mg total) by mouth daily. Start taking on:  08/09/2016   atorvastatin 80 MG tablet Commonly known as:  LIPITOR Take 1 tablet (80 mg total) by mouth every evening.   esomeprazole 40 MG capsule Commonly known as:  NEXIUM Take 1 capsule (40 mg total) by mouth daily.  lisinopril 20 MG tablet Commonly known as:  PRINIVIL,ZESTRIL Take 1 tablet (20 mg total) by mouth daily. What changed:  Another medication with the same name was removed. Continue taking this medication, and follow the directions you see here. (duplicate)   nitroGLYCERIN 0.4 MG SL tablet Commonly known as:  NITROSTAT Place 1 tablet (0.4 mg total) under the tongue every 5 (five) minutes as needed for chest pain. Up to 3 doses.   tamsulosin 0.4 MG Caps capsule Commonly known as:  FLOMAX Take 1 capsule (0.4 mg total) by mouth daily.   ticagrelor 90 MG Tabs tablet Commonly known as:  BRILINTA Take 1 tablet (90 mg total) by mouth every 12 (twelve) hours.        Allergies:  Allergies  Allergen Reactions  . Penicillins Anaphylaxis    Aspirin prescribed at discharge?  Yes High Intensity Statin Prescribed? (Lipitor 40-80mg  or Crestor 20-40mg ): Yes Beta Blocker Prescribed? No - bradycardia For EF <40%, was ACEI/ARB Prescribed? No: na ADP Receptor Inhibitor Prescribed? (i.e. Plavix etc.-Includes Medically Managed Patients): Yes For EF <40%, Aldosterone Inhibitor Prescribed? No: na Was EF assessed during THIS hospitalization? Yes Was Cardiac Rehab  II ordered? (Included Medically managed Patients): Yes   Outstanding Labs/Studies   n/a  Duration of Discharge Encounter   Greater than 30 minutes including physician time.  Signed, Nedra Hai Lendora Keys PA-C 08/08/2016, 10:59 AM

## 2016-08-08 NOTE — Care Management Note (Signed)
Case Management Note  Patient Details  Name: Jacob Quinn MRN: [id number] Date of Birth: 06/26/1955  Subjective/Objective:  S/p coronary stent, pta indep, he has pcp, has medication coverage and he has transportation at dc.  NCM gave patient his 30 day savings card for brilinta. Patient goes to [facility] on [address] , they have 21 tablets  brilinta in stock.  He is for dc today.                   Action/Plan:   Expected Discharge Date:                  Expected Discharge Plan:  Home/Self Care  In-House Referral:     Discharge planning Services  CM Consult  Post Acute Care Choice:    Choice offered to:     DME Arranged:    DME Agency:     HH Arranged:    HH Agency:     Status of Service:  Completed, signed off  If discussed at [employer] of Stay Meetings, dates discussed:    Additional Comments:  Zenon Mayo, RN 08/08/2016, 11:20 AM

## 2016-08-08 NOTE — Progress Notes (Signed)
Patient Name: Jacob Quinn Date of Encounter: 08/08/2016  Hospital Problem List     Active Problems:   Unstable angina (HCC)   Obesity (BMI 30-39.9)   Hyperlipidemia   CAD (coronary artery disease), native coronary artery    Patient Profile     The patient has a history of coronary calcium, HTN, hyperlipidemia who presented with unstable angina.   Subjective   No chest pain.  No SOB.   Inpatient Medications    . amLODipine  5 mg Oral Daily  . aspirin  81 mg Oral Daily  . atorvastatin  80 mg Oral q1800  . lisinopril  20 mg Oral Daily  . pantoprazole  40 mg Oral Daily  . sodium chloride flush  3 mL Intravenous Q12H  . tamsulosin  0.4 mg Oral Daily  . ticagrelor  90 mg Oral Q12H    Vital Signs    Vitals:   08/07/16 2200 08/08/16 0000 08/08/16 0642 08/08/16 0746  BP:  103/61 (!) 148/78 (!) 153/93  Pulse: (!) 59 64 (!) 58 63  Resp: 13 11 13 13   Temp:   97.8 F (36.6 C) 97.7 F (36.5 C)  TempSrc:   Oral Oral  SpO2: 97% 96% 97% 97%  Weight:   243 lb 2.7 oz (110.3 kg)   Height:        Intake/Output Summary (Last 24 hours) at 08/08/16 0848 Last data filed at 08/08/16 0752  Gross per 24 hour  Intake             1285 ml  Output             1800 ml  Net             -515 ml   Filed Weights   08/07/16 0637 08/07/16 2146 08/08/16 0642  Weight: 243 lb 3.2 oz (110.3 kg) 243 lb 2.7 oz (110.3 kg) 243 lb 2.7 oz (110.3 kg)    Physical Exam    GEN: Well nourished, well developed, in  no acute distress.  Neck: Supple, no JVD, carotid bruits, or masses. Cardiac: RRR, no rubs, or gallops. No clubbing, cyanosis, no edema.  Radials/DP/PT 2+ and equal bilaterally, right radial without bruising or bleeding.  Respiratory:  Respirations  regular and unlabored, clear to auscultation bilaterally. GI: Soft, nontender, nondistended, BS + x 4. Neuro:  Strength and sensation are intact.   Labs    CBC  Recent Labs  08/07/16 0202 08/08/16 0539  WBC 10.1 9.4  HGB 14.2  12.2*  HCT 43.4 37.8*  MCV 88.9 89.2  PLT 294 0000000   Basic Metabolic Panel  Recent Labs  08/07/16 0815 08/08/16 0539  NA 138 136  K 3.8 3.9  CL 99* 103  CO2 29 25  GLUCOSE 91 87  BUN 10 10  CREATININE 1.13 1.05  CALCIUM 9.6 8.8*   Liver Function Tests  Recent Labs  08/07/16 0815  AST 19  ALT 14*  ALKPHOS 73  BILITOT 1.2  PROT 7.1  ALBUMIN 3.8   No results for input(s): LIPASE, AMYLASE in the last 72 hours. Cardiac Enzymes  Recent Labs  08/07/16 0815 08/07/16 1217 08/07/16 2203  TROPONINI 0.21* 0.21* 0.29*   BNP Invalid input(s): POCBNP D-Dimer No results for input(s): DDIMER in the last 72 hours. Hemoglobin A1C No results for input(s): HGBA1C in the last 72 hours. Fasting Lipid Panel  Recent Labs  08/07/16 0815  CHOL 216*  HDL 41  LDLCALC 156*  TRIG 96  CHOLHDL 5.3   Thyroid Function Tests  Recent Labs  08/07/16 0815  TSH 1.930    Telemetry    SB   ECG    NSR, rate 59, axis WNL, intervals WNL, no acute ST T wave changes.    Radiology    Dg Chest 2 View  Result Date: 08/07/2016 CLINICAL DATA:  Chest pain. EXAM: CHEST  2 VIEW COMPARISON:  08/04/2016 FINDINGS: The cardiomediastinal contours are normal. Mild eventration of right hemidiaphragm unchanged. The lungs are clear. Pulmonary vasculature is normal. No consolidation, pleural effusion, or pneumothorax. No acute osseous abnormalities are seen. IMPRESSION: No active cardiopulmonary disease. Electronically Signed   By: Jeb Levering M.D.   On: 08/07/2016 02:29   Dg Chest 2 View  Result Date: 08/04/2016 CLINICAL DATA:  Burning in the chest for approximately 2 weeks. EXAM: CHEST  2 VIEW COMPARISON:  PA and lateral chest 01/30/2015 and 10/15/2007. FINDINGS: The lungs are clear. Heart size is upper normal. There is no pneumothorax or pleural effusion. Aortic atherosclerosis is noted. IMPRESSION: No acute disease. Atherosclerosis. Electronically Signed   By: Inge Rise M.D.   On:  08/04/2016 12:11    Assessment & Plan    NSTEMI:  Status post PCI stenting to OM.  OK to go home today.  Follow up with me in clinic.    HTN:   BP OK.    DYSLIPIDEMIA:  Reviewed with the patient.  Now on Lipitor.   Signed, Minus Breeding, MD  08/08/2016, 8:48 AM

## 2016-08-08 NOTE — Progress Notes (Signed)
Pt copay will be $45- prior auth not required for brilinta

## 2016-08-08 NOTE — Progress Notes (Signed)
CARDIAC REHAB PHASE I   PRE:  Rate/Rhythm: 75 SR  BP:  Sitting: 171/98        SaO2: 98 RA  MODE:  Ambulation: 500 ft   POST:  Rate/Rhythm: 85 SR  BP:  Sitting: 194/101         SaO2: 96 RA  Pt sitting up eating bacon for breakfast. Pt ambulated 500 ft on RA, handheld assist, fairly steady gait, tolerated well with no complaints. Completed MI/stent education with pt and significant other at bedside.  Reviewed risk factors, MI book, anti-platelet therapy, stent card, activity restrictions, ntg, exercise, heart healthy diet, and phase 2 cardiac rehab. Pt verbalized understanding, is very eager to return to work. Pt agrees to phase 2 cardiac rehab referral, will send to [facility] per pt request. Pt edge of bed after walk, call bell within reach.  [phone] Lenna Sciara, RN, BSN 08/08/2016 10:12 AM

## 2016-08-08 NOTE — Progress Notes (Signed)
TR BAND REMOVAL  LOCATION:    right radial  DEFLATED PER PROTOCOL:    Yes.    TIME BAND OFF / DRESSING APPLIED:    00:30   SITE UPON ARRIVAL:    Level 0  SITE AFTER BAND REMOVAL:    Level 0  CIRCULATION SENSATION AND MOVEMENT:    Within Normal Limits   Yes.    COMMENTS:   Post TR band instructions given. No hematoma, no bleeding. Pt tolerated well.

## 2016-08-10 ENCOUNTER — Telehealth: Payer: Self-pay | Admitting: Cardiology

## 2016-08-10 NOTE — Telephone Encounter (Signed)
Pt needing return to work note, has appt with Rhonda 08-17-16 would like to go back to work Monday 08-14-16.  Will route to Dr Percival Spanish for advice.

## 2016-08-10 NOTE — Telephone Encounter (Signed)
Pt was seen in the hospital. He needs a note to return to work on Monday.

## 2016-08-10 NOTE — Telephone Encounter (Signed)
OK to give him a letter returning to work.  Limit heavy lifting or exerting physical activity until we can see him back.

## 2016-08-11 NOTE — Telephone Encounter (Signed)
Patient came in for work note. Letter created and given to patient.

## 2016-08-14 NOTE — Telephone Encounter (Signed)
Can you try and closed this work note

## 2016-08-17 ENCOUNTER — Ambulatory Visit: Payer: BLUE CROSS/BLUE SHIELD | Admitting: Physician Assistant

## 2016-08-24 ENCOUNTER — Encounter: Payer: Self-pay | Admitting: Physician Assistant

## 2016-08-24 ENCOUNTER — Ambulatory Visit (INDEPENDENT_AMBULATORY_CARE_PROVIDER_SITE_OTHER): Payer: BLUE CROSS/BLUE SHIELD | Admitting: Physician Assistant

## 2016-08-24 VITALS — BP 143/87 | HR 55 | Ht 73.0 in | Wt 238.8 lb

## 2016-08-24 DIAGNOSIS — Z79899 Other long term (current) drug therapy: Secondary | ICD-10-CM | POA: Diagnosis not present

## 2016-08-24 DIAGNOSIS — E7849 Other hyperlipidemia: Secondary | ICD-10-CM

## 2016-08-24 DIAGNOSIS — E784 Other hyperlipidemia: Secondary | ICD-10-CM | POA: Diagnosis not present

## 2016-08-24 DIAGNOSIS — I214 Non-ST elevation (NSTEMI) myocardial infarction: Secondary | ICD-10-CM

## 2016-08-24 DIAGNOSIS — I119 Hypertensive heart disease without heart failure: Secondary | ICD-10-CM | POA: Diagnosis not present

## 2016-08-24 MED ORDER — MULTI VITAMIN DAILY PO TABS: 1.0000 | ORAL_TABLET | Freq: Every day | ORAL | Status: AC

## 2016-08-24 NOTE — Patient Instructions (Addendum)
Medication Instructions:  Your physician has recommended you make the following change in your medication:  1-Start taking a multi-vitamin for men by mouth daily  Labwork: NONE  Testing/Procedures: NONE  Follow-Up: Your physician wants you to follow-up in: 6 months with Dr. Claiborne Billings. You will receive a reminder letter in the mail two months in advance. If you don't receive a letter, please call our office to schedule the follow-up appointment.  You have been referred to outpatient Cardiac Rehab.  Please start walking 5 minutes twice daily, then increase by 1 minute daily, up to 15 minutes.    If you need a refill on your cardiac medications before your next appointment, please call your pharmacy.      Heart-Healthy Eating Plan Heart-healthy meal planning includes:  Limiting unhealthy fats.  Increasing healthy fats.  Making other small dietary changes. You may need to talk with your doctor or a diet specialist (dietitian) to create an eating plan that is right for you. WHAT TYPES OF FAT SHOULD I CHOOSE?  Choose healthy fats. These include olive oil and canola oil, flaxseeds, walnuts, almonds, and seeds.  Eat more omega-3 fats. These include salmon, mackerel, sardines, tuna, flaxseed oil, and ground flaxseeds. Try to eat fish at least twice each week.  Limit saturated fats.  Saturated fats are often found in animal products, such as meats, butter, and cream.  Plant sources of saturated fats include palm oil, palm kernel oil, and coconut oil.  Avoid foods with partially hydrogenated oils in them. These include stick margarine, some tub margarines, cookies, crackers, and other baked goods. These contain trans fats. WHAT GENERAL GUIDELINES DO I NEED TO FOLLOW?  Check food labels carefully. Identify foods with trans fats or high amounts of saturated fat.  Fill one half of your plate with vegetables and green salads. Eat 4-5 servings of vegetables per day. A serving of  vegetables is:  1 cup of raw leafy vegetables.   cup of raw or cooked cut-up vegetables.   cup of vegetable juice.  Fill one fourth of your plate with whole grains. Look for the word "whole" as the first word in the ingredient list.  Fill one fourth of your plate with lean protein foods.  Eat 4-5 servings of fruit per day. A serving of fruit is:  One medium whole fruit.   cup of dried fruit.   cup of fresh, frozen, or canned fruit.   cup of 100% fruit juice.  Eat more foods that contain soluble fiber. These include apples, broccoli, carrots, beans, peas, and barley. Try to get 20-30 g of fiber per day.  Eat more home-cooked food. Eat less restaurant, buffet, and fast food.  Limit or avoid alcohol.  Limit foods high in starch and sugar.  Avoid fried foods.  Avoid frying your food. Try baking, boiling, grilling, or broiling it instead. You can also reduce fat by:  Removing the skin from poultry.  Removing all visible fats from meats.  Skimming the fat off of stews, soups, and gravies before serving them.  Steaming vegetables in water or broth.  Lose weight if you are overweight.  Eat 4-5 servings of nuts, legumes, and seeds per week:  One serving of dried beans or legumes equals  cup after being cooked.  One serving of nuts equals 1 ounces.  One serving of seeds equals  ounce or one tablespoon.  You may need to keep track of how much salt or sodium you eat. This is especially true if  you have high blood pressure. Talk with your doctor or dietitian to get more information. WHAT FOODS CAN I EAT? Grains Breads, including [place], white, pita, wheat, raisin, rye, oatmeal, and [place]. Tortillas that are neither fried nor made with lard or trans fat. Low-fat rolls, including hotdog and hamburger buns and English muffins. Biscuits. Muffins. Waffles. Pancakes. Light popcorn. Whole-grain cereals. Flatbread. Melba toast. Pretzels. Breadsticks. Rusks. Low-fat snacks.  Low-fat crackers, including oyster, saltine, matzo, graham, animal, and rye. Rice and pasta, including brown rice and pastas that are made with whole wheat.  Vegetables All vegetables.  Fruits All fruits, but limit coconut. Meats and Other Protein Sources Lean, well-trimmed beef, veal, pork, and lamb. Chicken and [place] without skin. All fish and shellfish. Wild duck, rabbit, pheasant, and venison. Egg whites or low-cholesterol egg substitutes. Dried beans, peas, lentils, and tofu. Seeds and most nuts. Dairy Low-fat or nonfat cheeses, including ricotta, string, and mozzarella. Skim or 1% milk that is liquid, powdered, or evaporated. Buttermilk that is made with low-fat milk. Nonfat or low-fat yogurt. Beverages Mineral water. Diet carbonated beverages. Sweets and Desserts Sherbets and fruit ices. Honey, jam, marmalade, jelly, and syrups. Meringues and gelatins. Pure sugar candy, such as hard candy, jelly beans, gumdrops, mints, marshmallows, and small amounts of dark chocolate. [employer] Eat all sweets and desserts in moderation. Fats and Oils Nonhydrogenated (trans-free) margarines. Vegetable oils, including soybean, sesame, sunflower, olive, peanut, safflower, corn, canola, and cottonseed. Salad dressings or mayonnaise made with a vegetable oil. Limit added fats and oils that you use for cooking, baking, salads, and as spreads. Other Cocoa powder. Coffee and tea. All seasonings and condiments. The items listed above may not be a complete list of recommended foods or beverages. Contact your dietitian for more options. WHAT FOODS ARE NOT RECOMMENDED? Grains Breads that are made with saturated or trans fats, oils, or whole milk. Croissants. Butter rolls. Cheese breads. Sweet rolls. Donuts. Buttered popcorn. Chow mein noodles. High-fat crackers, such as cheese or butter crackers. Meats and Other Protein Sources Fatty meats, such as hotdogs, short ribs, sausage, spareribs, bacon, rib eye  roast or steak, and mutton. High-fat deli meats, such as salami and bologna. Caviar. Domestic duck and goose. Organ meats, such as kidney, liver, sweetbreads, and heart. Dairy Cream, sour cream, cream cheese, and creamed cottage cheese. Whole-milk cheeses, including blue (bleu), Monte[place] Jack, [place], [place], American, [place], Swiss, cheddar, [place], and [place]. Whole or 2% milk that is liquid, evaporated, or condensed. Whole buttermilk. Cream sauce or high-fat cheese sauce. Yogurt that is made from whole milk. Beverages Regular sodas and juice drinks with added sugar. Sweets and Desserts Frosting. Pudding. Cookies. Cakes other than angel food cake. Candy that has milk chocolate or white chocolate, hydrogenated fat, butter, coconut, or unknown ingredients. Buttered syrups. Full-fat ice cream or ice cream drinks. Fats and Oils Gravy that has suet, meat fat, or shortening. Cocoa butter, hydrogenated oils, palm oil, coconut oil, palm kernel oil. These can often be found in baked products, candy, fried foods, nondairy creamers, and whipped toppings. Solid fats and shortenings, including bacon fat, salt pork, lard, and butter. Nondairy cream substitutes, such as coffee creamers and sour cream substitutes. Salad dressings that are made of unknown oils, cheese, or sour cream. The items listed above may not be a complete list of foods and beverages to avoid. Contact your dietitian for more information.   This information is not intended to replace advice given to you by your health care provider. Make sure  you discuss any questions you have with your health care provider.   Document Released: 04/16/2012 Document Revised: 11/06/2014 Document Reviewed: 04/09/2014 Elsevier Interactive Patient Education [employer].

## 2016-08-24 NOTE — Progress Notes (Signed)
Cardiology Office Note   Date:  08/24/2016   ID:  Jacob, Quinn 06/26/1955, MRN [id number]  PCP:  Jacob Man, MD  Cardiologist:  Dr Meredeth Ide, PA-C   Chief Complaint  Patient presents with  . Follow-up    cath    History of Present Illness: Jacob Quinn is a 60 y.o. male with a history of HTN, HLD, GERD, remote AKI  D/c 10/10 after admit for NSTEMI w/ DES OM, med rx for 20% LAD, 70% D1, AM 75%, ICM W/ EF 50%. No BB due to baseline bradycardia.  Jacob Quinn presents for Post hospital follow-up.  Since discharge from the hospital, he has done well, going back to work. He is willing to do cardiac rehabilitation, but he works from 7 AM to 3 PM. He has done some walking, but is not doing the walking that cardiac rehabilitation wanted him to start doing consistently. He is willing to do this and we will make sure he is referred to outpatient cardiac rehabilitation.  He has not had any problems tolerating his medications. He does not follow his blood pressure, but was not surprised to hear it was high. He was told that his heart rate was 55 today, but he doesn't get lightheaded or dizzy. He never has presyncope and has never had syncope. He is not having any bleeding issues on the Brilinta and no side effects from the Lipitor.  In general, he has been feeling well. He was feeling a little tired after he went home from the hospital, and started taking a multivitamin. This seems to help. He is interested in getting information on a low-fat diet.   Past Medical History:  Diagnosis Date  . BPH associated with nocturia   . CAD in native artery    a. NSTEMI 07/2016 -  LHC 08/07/16 showing 70% D1, 20% mLAD, segmental 95% then 75% OM stenosis, 85% acute marg, normal LVEDP - received PTCA/DES to OM.  Marland Kitchen Encephalitis, viral   . GERD (gastroesophageal reflux disease)   . Hyperlipidemia 08/07/2016  . Hypertension   . Ischemic cardiomyopathy    a. LHC  07/2016 - mild mid anterolateral focal hypocontractility felt to be due to the circumflex stenosis, EF 50%.  . OA (osteoarthritis)   . Obesity (BMI 30-39.9) 08/07/2016  . Renal insufficiency    a. 2016 r/t dehydration/ACEI use.  Marland Kitchen Restless leg syndrome 09/16/2010  . Rheumatic fever   . Syncope     Past Surgical History:  Procedure Laterality Date  . CARDIAC CATHETERIZATION N/A 08/07/2016   Procedure: Left Heart Cath and Coronary Angiography;  Surgeon: Troy Sine, MD;  Location: [facility];  Service: Cardiovascular;  Laterality: N/A;  . CARDIAC CATHETERIZATION N/A 08/07/2016   Procedure: Coronary Stent Intervention;  Surgeon: Troy Sine, MD;  Location: [facility];  Service: Cardiovascular;  Laterality: N/A;  . REPLACEMENT TOTAL KNEE     left and right  . TONSILLECTOMY      Current Outpatient Prescriptions  Medication Sig Dispense Refill  . amLODipine (NORVASC) 10 MG tablet Take 1 tablet (10 mg total) by mouth daily. 30 tablet 6  . aspirin EC 81 MG EC tablet Take 1 tablet (81 mg total) by mouth daily. 30 tablet 11  . atorvastatin (LIPITOR) 80 MG tablet Take 1 tablet (80 mg total) by mouth every evening. 30 tablet 6  . lisinopril (PRINIVIL,ZESTRIL) 20 MG tablet Take 1 tablet (20 mg total)  by mouth daily. 30 tablet 0  . nitroGLYCERIN (NITROSTAT) 0.4 MG SL tablet Place 1 tablet (0.4 mg total) under the tongue every 5 (five) minutes as needed for chest pain. Up to 3 doses. 25 tablet 3  . ticagrelor (BRILINTA) 90 MG TABS tablet Take 1 tablet (90 mg total) by mouth every 12 (twelve) hours. 180 tablet 3   No current facility-administered medications for this visit.     Allergies:   Penicillins    Social History:  The patient  reports that he has never smoked. He has never used smokeless tobacco. He reports that he does not drink alcohol or use drugs.   Family History:  The patient's family history includes Dementia in his father; Depression in his brother; Heart  disease in his mother; Hypertension in his brother, brother, and mother; Prostate cancer in his father.    ROS:  Please see the history of present illness. All other systems are reviewed and negative.    PHYSICAL EXAM: VS:  BP (!) 143/87   Pulse (!) 55   Ht 6\' 1"  (1.854 m)   Wt 238 lb 12.8 oz (108.3 kg)   BMI 31.51 kg/m  , BMI Body mass index is 31.51 kg/m. GEN: Well nourished, well developed, male in no acute distress  HEENT: normal for age  Neck: no JVD, no carotid bruit, no masses Cardiac: RRR; Soft murmur, no rubs, or gallops Respiratory:  clear to auscultation bilaterally, normal work of breathing GI: soft, nontender, nondistended, + BS MS: no deformity or atrophy; no edema; distal pulses are 2+ in all 4 extremities   Skin: warm and dry, no rash Neuro:  Strength and sensation are intact Psych: euthymic mood, full affect   EKG:  EKG is ordered today. The ekg ordered today demonstrates sinus bradycardia, heart rate 55, LVH, no significant change from previous ECGs   Recent Labs: 09/22/2015: Magnesium 1.8 08/07/2016: ALT 14; TSH 1.930 08/08/2016: BUN 10; Creatinine, Ser 1.05; Hemoglobin 12.2; Platelets 254; Potassium 3.9; Sodium 136    Lipid Panel    Component Value Date/Time   CHOL 216 (H) 08/07/2016 0815   TRIG 96 08/07/2016 0815   HDL 41 08/07/2016 0815   CHOLHDL 5.3 08/07/2016 0815   VLDL 19 08/07/2016 0815   LDLCALC 156 (H) 08/07/2016 0815   LDLDIRECT 110.4 02/19/2007 0819     Wt Readings from Last 3 Encounters:  08/24/16 238 lb 12.8 oz (108.3 kg)  08/08/16 243 lb 2.7 oz (110.3 kg)  07/12/16 249 lb 4.8 oz (113.1 kg)     Other studies Reviewed: Additional studies/ records that were reviewed today include: Hospital records and testing.  ASSESSMENT AND PLAN:  1.  NSTEMI s/p DES OM: [name] is doing welL post MI. He is compliant with his aspirin, Brilinta, statin, and ACE inhibitor. Because of bradycardia at baseline, he is not on a beta blocker.  Continue medical therapy. He is not having ischemic symptoms from the residual 70% diagonal and 75% acute marginal. He is encouraged to walk per cardiac rehabilitation guidelines and do outpatient cardiac rehabilitation as well. We will make the referral.  2. Hyperlipidemia: Recheck lipids when he comes back to see Dr. Claiborne Billings. He was given information on a low-fat diet.  3. Hypertension: His blood pressure is elevated today, but he takes his medications in the afternoon. He is encouraged to check his blood pressure when he gets chance (he does not have a cuff at home).   He was told that if his  systolic blood pressures in the 140s or better, he should call us and we will increase his lisinopril. Since he has not yet had his blood pressure medications for today I will hold off on that.   Current medicines are reviewed at length with the patient today.  The patient does not have concerns regarding medicines.  The following changes have been made:  no change  Labs/ tests ordered today include:   Orders Placed This Encounter  Procedures  . Lipid panel  . Comprehensive metabolic panel  . AMB referral to cardiac rehabilitation  . EKG 12-Lead     Disposition:   FU with Dr. Claiborne Billings  Signed, Barrett, Suanne Marker, PA-C  08/24/2016 10:26 AM    [place] Phone: [phone]; Fax: [phone]  This note was written with the assistance of speech recognition software. Please excuse any transcriptional errors.

## 2016-08-28 ENCOUNTER — Encounter (HOSPITAL_COMMUNITY): Payer: Self-pay | Admitting: *Deleted

## 2016-08-29 ENCOUNTER — Telehealth: Payer: Self-pay | Admitting: Family Medicine

## 2016-08-29 NOTE — Telephone Encounter (Signed)
Manuela Schwartz, nurse case manager with [facility] called to advise pt had been in the hospital. She is going to reach out to pt for him to schedule a follow up appt. Pt not seen by Dr Sherren Mocha since 02/2015.

## 2016-09-13 ENCOUNTER — Telehealth (INDEPENDENT_AMBULATORY_CARE_PROVIDER_SITE_OTHER): Payer: Self-pay | Admitting: Orthopedic Surgery

## 2016-09-13 NOTE — Telephone Encounter (Signed)
Rx refill

## 2016-09-13 NOTE — Telephone Encounter (Signed)
Patient called needing Rx refilled (ibuprofen 800 mg) The number to contact him is [phone]

## 2016-09-14 MED ORDER — IBUPROFEN 800 MG PO TABS: 800.0000 mg | ORAL_TABLET | Freq: Three times a day (TID) | ORAL | 0 refills | Status: DC | PRN

## 2016-09-14 NOTE — Telephone Encounter (Signed)
Rx sent to pharm

## 2016-09-14 NOTE — Telephone Encounter (Signed)
Ok to rf pls cla lthx

## 2016-09-14 NOTE — Addendum Note (Signed)
Addended byBrand Males on: 09/14/2016 05:19 PM   Modules accepted: Orders

## 2016-09-19 ENCOUNTER — Encounter (HOSPITAL_COMMUNITY): Payer: Self-pay

## 2016-09-19 ENCOUNTER — Encounter (HOSPITAL_COMMUNITY)
Admission: RE | Admit: 2016-09-19 | Discharge: 2016-09-19 | Disposition: A | Discharge status: Home / Self Care | Payer: BLUE CROSS/BLUE SHIELD | Source: Ambulatory Visit | Attending: Cardiology | Admitting: Cardiology

## 2016-09-19 VITALS — BP 128/84 | HR 65 | Ht 72.0 in | Wt 235.2 lb

## 2016-09-19 DIAGNOSIS — I214 Non-ST elevation (NSTEMI) myocardial infarction: Secondary | ICD-10-CM | POA: Diagnosis not present

## 2016-09-19 DIAGNOSIS — Z955 Presence of coronary angioplasty implant and graft: Secondary | ICD-10-CM

## 2016-09-19 NOTE — Progress Notes (Signed)
Cardiac Rehab Medication Review by a Pharmacist  Does the patient feel that his/her medications are working for him/her?  yes  Has the patient been experiencing any side effects to the medications prescribed?  no  Does the patient measure his/her own blood pressure or blood glucose at home?  no   Does the patient have any problems obtaining medications due to transportation or finances?   no  Understanding of regimen: fair Understanding of indications: fair Potential of compliance: good    Pharmacist comments: Pt presents for initial cardiac rehab visit. Pt reports checking blood pressure occasionally at his pharmacy, encouraged to try to check several times a week or purchase a BP cuff for home use. No other issues noted.   Arrie Senate, PharmD PGY-1 Pharmacy Resident Pager: [phone] 09/19/2016

## 2016-09-19 NOTE — Progress Notes (Signed)
Cardiac Individual Treatment Plan  Patient Details  Name: Jacob Quinn MRN: [id number] Date of Birth: 06/26/1955 Referring Provider:   Flowsheet Row CARDIAC REHAB PHASE II ORIENTATION from 09/19/2016 in [facility]  Referring Provider  Shelva Majestic MD      Initial Encounter Date:  [facility] PHASE II ORIENTATION from 09/19/2016 in [facility]  Date  09/19/16  Referring Provider  Shelva Majestic MD      Visit Diagnosis: 08/07/16 NSTEMI (non-ST elevated myocardial infarction) ([place])  08/07/16 Status post insertion of drug eluting coronary artery stent  Patient's Home Medications on Admission:  Current Outpatient Prescriptions:  .  amLODipine (NORVASC) 10 MG tablet, Take 1 tablet (10 mg total) by mouth daily., Disp: 30 tablet, Rfl: 6 .  aspirin EC 81 MG EC tablet, Take 1 tablet (81 mg total) by mouth daily., Disp: 30 tablet, Rfl: 11 .  atorvastatin (LIPITOR) 80 MG tablet, Take 1 tablet (80 mg total) by mouth every evening., Disp: 30 tablet, Rfl: 6 .  ibuprofen (ADVIL,MOTRIN) 800 MG tablet, Take 1 tablet (800 mg total) by mouth every 8 (eight) hours as needed., Disp: 30 tablet, Rfl: 0 .  lisinopril (PRINIVIL,ZESTRIL) 20 MG tablet, Take 1 tablet (20 mg total) by mouth daily., Disp: 30 tablet, Rfl: 0 .  Multiple Vitamin (MULTI VITAMIN DAILY) TABS, Take 1 tablet by mouth daily., Disp: , Rfl:  .  nitroGLYCERIN (NITROSTAT) 0.4 MG SL tablet, Place 1 tablet (0.4 mg total) under the tongue every 5 (five) minutes as needed for chest pain. Up to 3 doses., Disp: 25 tablet, Rfl: 3 .  ticagrelor (BRILINTA) 90 MG TABS tablet, Take 1 tablet (90 mg total) by mouth every 12 (twelve) hours., Disp: 180 tablet, Rfl: 3  Past Medical History: Past Medical History:  Diagnosis Date  . BPH associated with nocturia   . CAD in native artery    a. NSTEMI 07/2016 -  LHC 08/07/16 showing 70% D1, 20% mLAD, segmental 95% then 75% OM  stenosis, 85% acute marg, normal LVEDP - received PTCA/DES to OM.  [facility] [place] Encephalitis, viral   . GERD (gastroesophageal reflux disease)   . Hyperlipidemia 08/07/2016  . Hypertension   . Ischemic cardiomyopathy    a. LHC 07/2016 - mild mid anterolateral focal hypocontractility felt to be due to the circumflex stenosis, EF 50%.  . OA (osteoarthritis)   . Obesity (BMI 30-39.9) 08/07/2016  . Renal insufficiency    a. 2016 r/t dehydration/ACEI use.  [place] Kitchen Restless leg syndrome 09/16/2010  . Rheumatic fever   . Syncope     Tobacco Use: History  Smoking Status  . Never Smoker  Smokeless Tobacco  . Never Used    Labs: Recent Review Flowsheet Data    Labs for ITP Cardiac and Pulmonary Rehab Latest Ref Rng & Units 01/12/2014 03/17/2014 01/30/2015 05/13/2015 08/07/2016   Cholestrol 0 - 200 mg/dL 200 208(H) - 200 216(H)   LDLCALC 0 - 99 mg/dL 120(H) 146(H) - 133(H) 156(H)   LDLDIRECT mg/dL - - - - -   HDL >40 mg/dL 39.00(L) 45.20 - 39.20 41   Trlycerides <150 mg/dL 207.0(H) 86.0 - 139.0 96   Hemoglobin A1c 4.8 - 5.6 % - - 5.6 - -      Capillary Blood Glucose: No results found for: GLUCAP   Exercise Target Goals: Date: 09/19/16  Exercise Program Goal: Individual exercise prescription set with THRR, safety & activity barriers. Participant demonstrates ability to understand and  report RPE using BORG scale, to self-measure pulse accurately, and to acknowledge the importance of the exercise prescription.  Exercise Prescription Goal: Starting with aerobic activity 30 plus minutes a day, 3 days per week for initial exercise prescription. Provide home exercise prescription and guidelines that participant acknowledges understanding prior to discharge.  Activity Barriers & Risk Stratification:     Activity Barriers & Cardiac Risk Stratification - 09/19/16 1449      Activity Barriers & Cardiac Risk Stratification   Activity Barriers Joint Problems;Deconditioning;Muscular Weakness;Right Knee  Replacement;Left Knee Replacement  partial L knee replacement, R TKR x2   Cardiac Risk Stratification Moderate      6 Minute Walk:     6 Minute Walk    Row Name 09/19/16 1632         6 Minute Walk   Phase Initial     Distance 1534 feet     Walk Time 6 minutes     # of Rest Breaks 0     MPH 2.9     METS 3.17     RPE 13     VO2 Peak 12.97     Symptoms No     Resting HR 65 bpm     Resting BP 128/84     Max Ex. HR 108 bpm     Max Ex. BP 150/90     2 Minute Post BP 134/80        Initial Exercise Prescription:     Initial Exercise Prescription - 09/19/16 1600      Date of Initial Exercise RX and Referring Provider   Date 09/19/16   Referring Provider Shelva Majestic MD     NuStep   Level 2   Minutes 10   METs 2     Arm Ergometer   Level 1   Minutes 10   METs 2     Track   Laps 10   Minutes 10   METs 2.74     Prescription Details   Frequency (times per week) 3   Duration Progress to 30 minutes of continuous aerobic without signs/symptoms of physical distress     Intensity   THRR 40-80% of Max Heartrate 63-126   Ratings of Perceived Exertion 11-13     Progression   Progression Continue progressive overload as per policy without signs/symptoms or physical distress.     Resistance Training   Training Prescription Yes   Weight 2lbs   Reps 10-12      Perform Capillary Blood Glucose checks as needed.  Exercise Prescription Changes:   Exercise Comments:   Discharge Exercise Prescription (Final Exercise Prescription Changes):   Nutrition:  Target Goals: Understanding of nutrition guidelines, daily intake of sodium 1500mg , cholesterol 200mg , calories 30% from fat and 7% or less from saturated fats, daily to have 5 or more servings of fruits and vegetables.  Biometrics:      Post Biometrics - 09/19/16 1636       Post  Biometrics   Waist Circumference 42.5 inches   Hip Circumference 45 inches   Waist to Hip Ratio 0.94 %   Triceps  Skinfold 19 mm   % Body Fat 30.6 %   Grip Strength 46 kg   Flexibility 14 in   Single Leg Stand 2.75 seconds      Nutrition Therapy Plan and Nutrition Goals:   Nutrition Discharge: Nutrition Scores:   Nutrition Goals Re-Evaluation:   Psychosocial: Target Goals: Acknowledge presence or absence of depression, maximize coping skills,  provide positive support system. Participant is able to verbalize types and ability to use techniques and skills needed for reducing stress and depression.  Initial Review & Psychosocial Screening:     Initial Psych Review & Screening - 09/19/16 [place]? Yes  family      Barriers   Psychosocial barriers to participate in program There are no identifiable barriers or psychosocial needs.     Screening Interventions   Interventions Encouraged to exercise      Quality of Life Scores:     Quality of Life - 09/19/16 1642      Quality of Life Scores   Health/Function Pre 24.8 %   Socioeconomic Pre 24.56 %   Psych/Spiritual Pre 24 %   Family Pre 30 %   GLOBAL Pre 25.33 %      PHQ-9: Recent Review Flowsheet Data    There is no flowsheet data to display.      Psychosocial Evaluation and Intervention:   Psychosocial Re-Evaluation:   Vocational Rehabilitation: Provide vocational rehab assistance to qualifying candidates.   Vocational Rehab Evaluation & Intervention:     Vocational Rehab - 09/19/16 1702      Initial Vocational Rehab Evaluation & Intervention   Assessment shows need for Vocational Rehabilitation No      Education: Education Goals: Education classes will be provided on a weekly basis, covering required topics. Participant will state understanding/return demonstration of topics presented.  Learning Barriers/Preferences:     Learning Barriers/Preferences - 09/19/16 1450      Learning Barriers/Preferences   Learning Barriers Sight   Learning Preferences Skilled  Demonstration;Pictoral;Video      Education Topics: Count Your Pulse:  -Group instruction provided by verbal instruction, demonstration, patient participation and written materials to support subject.  Instructors address importance of being able to find your pulse and how to count your pulse when at home without a heart monitor.  Patients get hands on experience counting their pulse with staff help and individually.   Heart Attack, Angina, and Risk Factor Modification:  -Group instruction provided by verbal instruction, video, and written materials to support subject.  Instructors address signs and symptoms of angina and heart attacks.    Also discuss risk factors for heart disease and how to make changes to improve heart health risk factors.   Functional Fitness:  -Group instruction provided by verbal instruction, demonstration, patient participation, and written materials to support subject.  Instructors address safety measures for doing things around the house.  Discuss how to get up and down off the floor, how to pick things up properly, how to safely get out of a chair without assistance, and balance training.   Meditation and Mindfulness:  -Group instruction provided by verbal instruction, patient participation, and written materials to support subject.  Instructor addresses importance of mindfulness and meditation practice to help reduce stress and improve awareness.  Instructor also leads participants through a meditation exercise.    Stretching for Flexibility and Mobility:  -Group instruction provided by verbal instruction, patient participation, and written materials to support subject.  Instructors lead participants through series of stretches that are designed to increase flexibility thus improving mobility.  These stretches are additional exercise for major muscle groups that are typically performed during regular warm up and cool down.   Hands Only CPR Anytime:  -Group  instruction provided by verbal instruction, video, patient participation and written materials to support subject.  Instructors co-teach with  AHA video for hands only CPR.  Participants get hands on experience with mannequins.   Nutrition I class: Heart Healthy Eating:  -Group instruction provided by PowerPoint slides, verbal discussion, and written materials to support subject matter. The instructor gives an explanation and review of the Therapeutic Lifestyle Changes diet recommendations, which includes a discussion on lipid goals, dietary fat, sodium, fiber, plant stanol/sterol esters, sugar, and the components of a well-balanced, healthy diet.   Nutrition II class: Lifestyle Skills:  -Group instruction provided by PowerPoint slides, verbal discussion, and written materials to support subject matter. The instructor gives an explanation and review of label reading, grocery shopping for heart health, heart healthy recipe modifications, and ways to make healthier choices when eating out.   Diabetes Question & Answer:  -Group instruction provided by PowerPoint slides, verbal discussion, and written materials to support subject matter. The instructor gives an explanation and review of diabetes co-morbidities, pre- and post-prandial blood glucose goals, pre-exercise blood glucose goals, signs, symptoms, and treatment of hypoglycemia and hyperglycemia, and foot care basics.   Diabetes Blitz:  -Group instruction provided by PowerPoint slides, verbal discussion, and written materials to support subject matter. The instructor gives an explanation and review of the physiology behind type 1 and type 2 diabetes, diabetes medications and rational behind using different medications, pre- and post-prandial blood glucose recommendations and Hemoglobin A1c goals, diabetes diet, and exercise including blood glucose guidelines for exercising safely.    Portion Distortion:  -Group instruction provided by PowerPoint  slides, verbal discussion, written materials, and food models to support subject matter. The instructor gives an explanation of serving size versus portion size, changes in portions sizes over the last 20 years, and what consists of a serving from each food group.   Stress Management:  -Group instruction provided by verbal instruction, video, and written materials to support subject matter.  Instructors review role of stress in heart disease and how to cope with stress positively.     Exercising on Your Own:  -Group instruction provided by verbal instruction, power point, and written materials to support subject.  Instructors discuss benefits of exercise, components of exercise, frequency and intensity of exercise, and end points for exercise.  Also discuss use of nitroglycerin and activating EMS.  Review options of places to exercise outside of rehab.  Review guidelines for sex with heart disease.   Cardiac Drugs I:  -Group instruction provided by verbal instruction and written materials to support subject.  Instructor reviews cardiac drug classes: antiplatelets, anticoagulants, beta blockers, and statins.  Instructor discusses reasons, side effects, and lifestyle considerations for each drug class.   Cardiac Drugs II:  -Group instruction provided by verbal instruction and written materials to support subject.  Instructor reviews cardiac drug classes: angiotensin converting enzyme inhibitors (ACE-I), angiotensin II receptor blockers (ARBs), nitrates, and calcium channel blockers.  Instructor discusses reasons, side effects, and lifestyle considerations for each drug class.   Anatomy and Physiology of the Circulatory System:  -Group instruction provided by verbal instruction, video, and written materials to support subject.  Reviews functional anatomy of heart, how it relates to various diagnoses, and what role the heart plays in the overall system.   Knowledge Questionnaire Score:      Knowledge Questionnaire Score - 09/19/16 1631      Knowledge Questionnaire Score   Pre Score 24/24      Core Components/Risk Factors/Patient Goals at Admission:     Personal Goals and Risk Factors at Admission - 09/19/16 1451  Core Components/Risk Factors/Patient Goals on Admission    Weight Management Yes;Weight Loss   Intervention Weight Management: Develop a combined nutrition and exercise program designed to reach desired caloric intake, while maintaining appropriate intake of nutrient and fiber, sodium and fats, and appropriate energy expenditure required for the weight goal.;Obesity: Provide education and appropriate resources to help participant work on and attain dietary goals.   Expected Outcomes Weight Maintenance: Understanding of the daily nutrition guidelines, which includes 25-35% calories from fat, 7% or less cal from saturated fats, less than 200mg  cholesterol, less than 1.5gm of sodium, & 5 or more servings of fruits and vegetables daily;Long Term: Adherence to nutrition and physical activity/exercise program aimed toward attainment of established weight goal;Short Term: Continue to assess and modify interventions until short term weight is achieved;Weight Loss: Understanding of general recommendations for a balanced deficit meal plan, which promotes 1-2 lb weight loss per week and includes a negative energy balance of [phone] kcal/d;Understanding recommendations for meals to include 15-35% energy as protein, 25-35% energy from fat, 35-60% energy from carbohydrates, less than 200mg  of dietary cholesterol, 20-35 gm of total fiber daily;Understanding of distribution of calorie intake throughout the day with the consumption of 4-5 meals/snacks;Weight Gain: Understanding of general recommendations for a high calorie, high protein meal plan that promotes weight gain by distributing calorie intake throughout the day with the consumption for 4-5 meals, snacks, and/or supplements    Sedentary Yes   Intervention Provide advice, education, support and counseling about physical activity/exercise needs.;Develop an individualized exercise prescription for aerobic and resistive training based on initial evaluation findings, risk stratification, comorbidities and participant's personal goals.   Expected Outcomes Achievement of increased cardiorespiratory fitness and enhanced flexibility, muscular endurance and strength shown through measurements of functional capacity and personal statement of participant.   Increase Strength and Stamina Yes   Intervention Provide advice, education, support and counseling about physical activity/exercise needs.;Develop an individualized exercise prescription for aerobic and resistive training based on initial evaluation findings, risk stratification, comorbidities and participant's personal goals.   Expected Outcomes Achievement of increased cardiorespiratory fitness and enhanced flexibility, muscular endurance and strength shown through measurements of functional capacity and personal statement of participant.   Improve shortness of breath with ADL's Yes   Intervention Provide education, individualized exercise plan and daily activity instruction to help decrease symptoms of SOB with activities of daily living.   Expected Outcomes Short Term: Achieves a reduction of symptoms when performing activities of daily living.   Hypertension Yes   Intervention Provide education on lifestyle modifcations including regular physical activity/exercise, weight management, moderate sodium restriction and increased consumption of fresh fruit, vegetables, and low fat dairy, alcohol moderation, and smoking cessation.;Monitor prescription use compliance.   Expected Outcomes Long Term: Maintenance of blood pressure at goal levels.;Short Term: Continued assessment and intervention until BP is < 140/26mm HG in hypertensive participants. < 130/17mm HG in hypertensive  participants with diabetes, heart failure or chronic kidney disease.   Lipids Yes   Intervention Provide education and support for participant on nutrition & aerobic/resistive exercise along with prescribed medications to achieve LDL 70mg , HDL >40mg .   Expected Outcomes Short Term: Participant states understanding of desired cholesterol values and is compliant with medications prescribed. Participant is following exercise prescription and nutrition guidelines.;Long Term: Cholesterol controlled with medications as prescribed, with individualized exercise RX and with personalized nutrition plan. Value goals: LDL < 70mg , HDL > 40 mg.   Personal Goal short: lose~10lbs and increase strength and stamina. Long: goal wt @  220lbs   Intervention Provide nutrition and exercise guidelines to assist with weightloss and improving cardiovascular fitness   Expected Outcomes Pt will be able to lose wt and increase strength and stamina      Core Components/Risk Factors/Patient Goals Review:    Core Components/Risk Factors/Patient Goals at Discharge (Final Review):    ITP Comments:     ITP Comments    Row Name 09/19/16 1341           ITP Comments Medical Director- Dr. Fransico Him, MD.           Comments: Patient attended orientation from 1330 to 1530to review rules and guidelines for program. Completed 6 minute walk test, Intitial ITP, and exercise prescription.  VSS. Telemetry-sinus rhythm, first degree AV block.   Asymptomatic.

## 2016-09-20 NOTE — Progress Notes (Signed)
Cardiac Individual Treatment Plan  Patient Details  Name: Jacob Quinn MRN: [id number] Date of Birth: 06/26/1955 Referring Provider:   Flowsheet Row CARDIAC REHAB PHASE II ORIENTATION from 09/19/2016 in [place]  Referring Provider  Shelva Majestic MD      Initial Encounter Date:  [place] PHASE II ORIENTATION from 09/19/2016 in [place]  Date  09/19/16  Referring Provider  Shelva Majestic MD      Visit Diagnosis: 08/07/16 NSTEMI (non-ST elevated myocardial infarction) ([place])  08/07/16 Status post insertion of drug eluting coronary artery stent  Patient's Home Medications on Admission:  Current Outpatient Prescriptions:  .  amLODipine (NORVASC) 10 MG tablet, Take 1 tablet (10 mg total) by mouth daily., Disp: 30 tablet, Rfl: 6 .  aspirin EC 81 MG EC tablet, Take 1 tablet (81 mg total) by mouth daily., Disp: 30 tablet, Rfl: 11 .  atorvastatin (LIPITOR) 80 MG tablet, Take 1 tablet (80 mg total) by mouth every evening., Disp: 30 tablet, Rfl: 6 .  ibuprofen (ADVIL,MOTRIN) 800 MG tablet, Take 1 tablet (800 mg total) by mouth every 8 (eight) hours as needed., Disp: 30 tablet, Rfl: 0 .  lisinopril (PRINIVIL,ZESTRIL) 20 MG tablet, Take 1 tablet (20 mg total) by mouth daily., Disp: 30 tablet, Rfl: 0 .  Multiple Vitamin (MULTI VITAMIN DAILY) TABS, Take 1 tablet by mouth daily., Disp: , Rfl:  .  nitroGLYCERIN (NITROSTAT) 0.4 MG SL tablet, Place 1 tablet (0.4 mg total) under the tongue every 5 (five) minutes as needed for chest pain. Up to 3 doses., Disp: 25 tablet, Rfl: 3 .  ticagrelor (BRILINTA) 90 MG TABS tablet, Take 1 tablet (90 mg total) by mouth every 12 (twelve) hours., Disp: 180 tablet, Rfl: 3  Past Medical History: Past Medical History:  Diagnosis Date  . BPH associated with nocturia   . CAD in native artery    a. NSTEMI 07/2016 -  LHC 08/07/16 showing 70% D1, 20% mLAD, segmental 95% then 75% OM  stenosis, 85% acute marg, normal LVEDP - received PTCA/DES to OM.  Marland Kitchen Encephalitis, viral   . GERD (gastroesophageal reflux disease)   . Hyperlipidemia 08/07/2016  . Hypertension   . Ischemic cardiomyopathy    a. LHC 07/2016 - mild mid anterolateral focal hypocontractility felt to be due to the circumflex stenosis, EF 50%.  . OA (osteoarthritis)   . Obesity (BMI 30-39.9) 08/07/2016  . Renal insufficiency    a. 2016 r/t dehydration/ACEI use.  Marland Kitchen Restless leg syndrome 09/16/2010  . Rheumatic fever   . Syncope     Tobacco Use: History  Smoking Status  . Never Smoker  Smokeless Tobacco  . Never Used    Labs: Recent Review Flowsheet Data    Labs for ITP Cardiac and Pulmonary Rehab Latest Ref Rng & Units 01/12/2014 03/17/2014 01/30/2015 05/13/2015 08/07/2016   Cholestrol 0 - 200 mg/dL 200 208(H) - 200 216(H)   LDLCALC 0 - 99 mg/dL 120(H) 146(H) - 133(H) 156(H)   LDLDIRECT mg/dL - - - - -   HDL >40 mg/dL 39.00(L) 45.20 - 39.20 41   Trlycerides <150 mg/dL 207.0(H) 86.0 - 139.0 96   Hemoglobin A1c 4.8 - 5.6 % - - 5.6 - -      Capillary Blood Glucose: No results found for: GLUCAP   Exercise Target Goals: Date: 09/19/16  Exercise Program Goal: Individual exercise prescription set with THRR, safety & activity barriers. Participant demonstrates ability to understand and  report RPE using BORG scale, to self-measure pulse accurately, and to acknowledge the importance of the exercise prescription.  Exercise Prescription Goal: Starting with aerobic activity 30 plus minutes a day, 3 days per week for initial exercise prescription. Provide home exercise prescription and guidelines that participant acknowledges understanding prior to discharge.  Activity Barriers & Risk Stratification:     Activity Barriers & Cardiac Risk Stratification - 09/19/16 1449      Activity Barriers & Cardiac Risk Stratification   Activity Barriers Joint Problems;Deconditioning;Muscular Weakness;Right Knee  Replacement;Left Knee Replacement  partial L knee replacement, R TKR x2   Cardiac Risk Stratification Moderate      6 Minute Walk:     6 Minute Walk    Row Name 09/19/16 1632         6 Minute Walk   Phase Initial     Distance 1534 feet     Walk Time 6 minutes     # of Rest Breaks 0     MPH 2.9     METS 3.17     RPE 13     VO2 Peak 12.97     Symptoms No     Resting HR 65 bpm     Resting BP 128/84     Max Ex. HR 108 bpm     Max Ex. BP 150/90     2 Minute Post BP 134/80        Initial Exercise Prescription:     Initial Exercise Prescription - 09/19/16 1600      Date of Initial Exercise RX and Referring Provider   Date 09/19/16   Referring Provider Shelva Majestic MD     NuStep   Level 2   Minutes 10   METs 2     Arm Ergometer   Level 1   Minutes 10   METs 2     Track   Laps 10   Minutes 10   METs 2.74     Prescription Details   Frequency (times per week) 3   Duration Progress to 30 minutes of continuous aerobic without signs/symptoms of physical distress     Intensity   THRR 40-80% of Max Heartrate 63-126   Ratings of Perceived Exertion 11-13     Progression   Progression Continue progressive overload as per policy without signs/symptoms or physical distress.     Resistance Training   Training Prescription Yes   Weight 2lbs   Reps 10-12      Perform Capillary Blood Glucose checks as needed.  Exercise Prescription Changes:   Exercise Comments:   Discharge Exercise Prescription (Final Exercise Prescription Changes):   Nutrition:  Target Goals: Understanding of nutrition guidelines, daily intake of sodium 1500mg , cholesterol 200mg , calories 30% from fat and 7% or less from saturated fats, daily to have 5 or more servings of fruits and vegetables.  Biometrics:      Post Biometrics - 09/19/16 1636       Post  Biometrics   Waist Circumference 42.5 inches   Hip Circumference 45 inches   Waist to Hip Ratio 0.94 %   Triceps  Skinfold 19 mm   % Body Fat 30.6 %   Grip Strength 46 kg   Flexibility 14 in   Single Leg Stand 2.75 seconds      Nutrition Therapy Plan and Nutrition Goals:   Nutrition Discharge: Nutrition Scores:   Nutrition Goals Re-Evaluation:   Psychosocial: Target Goals: Acknowledge presence or absence of depression, maximize coping skills,  provide positive support system. Participant is able to verbalize types and ability to use techniques and skills needed for reducing stress and depression.  Initial Review & Psychosocial Screening:     Initial Psych Review & Screening - 09/19/16 1708      Family Dynamics   Good Support System? --  family, fiancee, and children       Quality of Life Scores:     Quality of Life - 09/19/16 1642      Quality of Life Scores   Health/Function Pre 24.8 %   Socioeconomic Pre 24.56 %   Psych/Spiritual Pre 24 %   Family Pre 30 %   GLOBAL Pre 25.33 %      PHQ-9: Recent Review Flowsheet Data    There is no flowsheet data to display.      Psychosocial Evaluation and Intervention:   Psychosocial Re-Evaluation:   Vocational Rehabilitation: Provide vocational rehab assistance to qualifying candidates.   Vocational Rehab Evaluation & Intervention:     Vocational Rehab - 09/19/16 1702      Initial Vocational Rehab Evaluation & Intervention   Assessment shows need for Vocational Rehabilitation No      Education: Education Goals: Education classes will be provided on a weekly basis, covering required topics. Participant will state understanding/return demonstration of topics presented.  Learning Barriers/Preferences:     Learning Barriers/Preferences - 09/19/16 1450      Learning Barriers/Preferences   Learning Barriers Sight   Learning Preferences Skilled Demonstration;Pictoral;Video      Education Topics: Count Your Pulse:  -Group instruction provided by verbal instruction, demonstration, patient participation and written  materials to support subject.  Instructors address importance of being able to find your pulse and how to count your pulse when at home without a heart monitor.  Patients get hands on experience counting their pulse with staff help and individually.   Heart Attack, Angina, and Risk Factor Modification:  -Group instruction provided by verbal instruction, video, and written materials to support subject.  Instructors address signs and symptoms of angina and heart attacks.    Also discuss risk factors for heart disease and how to make changes to improve heart health risk factors.   Functional Fitness:  -Group instruction provided by verbal instruction, demonstration, patient participation, and written materials to support subject.  Instructors address safety measures for doing things around the house.  Discuss how to get up and down off the floor, how to pick things up properly, how to safely get out of a chair without assistance, and balance training.   Meditation and Mindfulness:  -Group instruction provided by verbal instruction, patient participation, and written materials to support subject.  Instructor addresses importance of mindfulness and meditation practice to help reduce stress and improve awareness.  Instructor also leads participants through a meditation exercise.    Stretching for Flexibility and Mobility:  -Group instruction provided by verbal instruction, patient participation, and written materials to support subject.  Instructors lead participants through series of stretches that are designed to increase flexibility thus improving mobility.  These stretches are additional exercise for major muscle groups that are typically performed during regular warm up and cool down.   Hands Only CPR Anytime:  -Group instruction provided by verbal instruction, video, patient participation and written materials to support subject.  Instructors co-teach with AHA video for hands only CPR.   Participants get hands on experience with mannequins.   Nutrition I class: Heart Healthy Eating:  -Group instruction provided by PowerPoint slides, verbal  discussion, and written materials to support subject matter. The instructor gives an explanation and review of the Therapeutic Lifestyle Changes diet recommendations, which includes a discussion on lipid goals, dietary fat, sodium, fiber, plant stanol/sterol esters, sugar, and the components of a well-balanced, healthy diet.   Nutrition II class: Lifestyle Skills:  -Group instruction provided by PowerPoint slides, verbal discussion, and written materials to support subject matter. The instructor gives an explanation and review of label reading, grocery shopping for heart health, heart healthy recipe modifications, and ways to make healthier choices when eating out.   Diabetes Question & Answer:  -Group instruction provided by PowerPoint slides, verbal discussion, and written materials to support subject matter. The instructor gives an explanation and review of diabetes co-morbidities, pre- and post-prandial blood glucose goals, pre-exercise blood glucose goals, signs, symptoms, and treatment of hypoglycemia and hyperglycemia, and foot care basics.   Diabetes Blitz:  -Group instruction provided by PowerPoint slides, verbal discussion, and written materials to support subject matter. The instructor gives an explanation and review of the physiology behind type 1 and type 2 diabetes, diabetes medications and rational behind using different medications, pre- and post-prandial blood glucose recommendations and Hemoglobin A1c goals, diabetes diet, and exercise including blood glucose guidelines for exercising safely.    Portion Distortion:  -Group instruction provided by PowerPoint slides, verbal discussion, written materials, and food models to support subject matter. The instructor gives an explanation of serving size versus portion size, changes in  portions sizes over the last 20 years, and what consists of a serving from each food group.   Stress Management:  -Group instruction provided by verbal instruction, video, and written materials to support subject matter.  Instructors review role of stress in heart disease and how to cope with stress positively.     Exercising on Your Own:  -Group instruction provided by verbal instruction, power point, and written materials to support subject.  Instructors discuss benefits of exercise, components of exercise, frequency and intensity of exercise, and end points for exercise.  Also discuss use of nitroglycerin and activating EMS.  Review options of places to exercise outside of rehab.  Review guidelines for sex with heart disease.   Cardiac Drugs I:  -Group instruction provided by verbal instruction and written materials to support subject.  Instructor reviews cardiac drug classes: antiplatelets, anticoagulants, beta blockers, and statins.  Instructor discusses reasons, side effects, and lifestyle considerations for each drug class.   Cardiac Drugs II:  -Group instruction provided by verbal instruction and written materials to support subject.  Instructor reviews cardiac drug classes: angiotensin converting enzyme inhibitors (ACE-I), angiotensin II receptor blockers (ARBs), nitrates, and calcium channel blockers.  Instructor discusses reasons, side effects, and lifestyle considerations for each drug class.   Anatomy and Physiology of the Circulatory System:  -Group instruction provided by verbal instruction, video, and written materials to support subject.  Reviews functional anatomy of heart, how it relates to various diagnoses, and what role the heart plays in the overall system.   Knowledge Questionnaire Score:     Knowledge Questionnaire Score - 09/19/16 1631      Knowledge Questionnaire Score   Pre Score 24/24      Core Components/Risk Factors/Patient Goals at Admission:      Personal Goals and Risk Factors at Admission - 09/19/16 1451      Core Components/Risk Factors/Patient Goals on Admission    Weight Management Yes;Weight Loss   Intervention Weight Management: Develop a combined nutrition and exercise program designed to  reach desired caloric intake, while maintaining appropriate intake of nutrient and fiber, sodium and fats, and appropriate energy expenditure required for the weight goal.;Obesity: Provide education and appropriate resources to help participant work on and attain dietary goals.   Expected Outcomes Weight Maintenance: Understanding of the daily nutrition guidelines, which includes 25-35% calories from fat, 7% or less cal from saturated fats, less than 200mg  cholesterol, less than 1.5gm of sodium, & 5 or more servings of fruits and vegetables daily;Long Term: Adherence to nutrition and physical activity/exercise program aimed toward attainment of established weight goal;Short Term: Continue to assess and modify interventions until short term weight is achieved;Weight Loss: Understanding of general recommendations for a balanced deficit meal plan, which promotes 1-2 lb weight loss per week and includes a negative energy balance of [phone] kcal/d;Understanding recommendations for meals to include 15-35% energy as protein, 25-35% energy from fat, 35-60% energy from carbohydrates, less than 200mg  of dietary cholesterol, 20-35 gm of total fiber daily;Understanding of distribution of calorie intake throughout the day with the consumption of 4-5 meals/snacks;Weight Gain: Understanding of general recommendations for a high calorie, high protein meal plan that promotes weight gain by distributing calorie intake throughout the day with the consumption for 4-5 meals, snacks, and/or supplements   Sedentary Yes   Intervention Provide advice, education, support and counseling about physical activity/exercise needs.;Develop an individualized exercise prescription for  aerobic and resistive training based on initial evaluation findings, risk stratification, comorbidities and participant's personal goals.   Expected Outcomes Achievement of increased cardiorespiratory fitness and enhanced flexibility, muscular endurance and strength shown through measurements of functional capacity and personal statement of participant.   Increase Strength and Stamina Yes   Intervention Provide advice, education, support and counseling about physical activity/exercise needs.;Develop an individualized exercise prescription for aerobic and resistive training based on initial evaluation findings, risk stratification, comorbidities and participant's personal goals.   Expected Outcomes Achievement of increased cardiorespiratory fitness and enhanced flexibility, muscular endurance and strength shown through measurements of functional capacity and personal statement of participant.   Improve shortness of breath with ADL's Yes   Intervention Provide education, individualized exercise plan and daily activity instruction to help decrease symptoms of SOB with activities of daily living.   Expected Outcomes Short Term: Achieves a reduction of symptoms when performing activities of daily living.   Hypertension Yes   Intervention Provide education on lifestyle modifcations including regular physical activity/exercise, weight management, moderate sodium restriction and increased consumption of fresh fruit, vegetables, and low fat dairy, alcohol moderation, and smoking cessation.;Monitor prescription use compliance.   Expected Outcomes Long Term: Maintenance of blood pressure at goal levels.;Short Term: Continued assessment and intervention until BP is < 140/31mm HG in hypertensive participants. < 130/28mm HG in hypertensive participants with diabetes, heart failure or chronic kidney disease.   Lipids Yes   Intervention Provide education and support for participant on nutrition & aerobic/resistive  exercise along with prescribed medications to achieve LDL 70mg , HDL >40mg .   Expected Outcomes Short Term: Participant states understanding of desired cholesterol values and is compliant with medications prescribed. Participant is following exercise prescription and nutrition guidelines.;Long Term: Cholesterol controlled with medications as prescribed, with individualized exercise RX and with personalized nutrition plan. Value goals: LDL < 70mg , HDL > 40 mg.   Personal Goal short: lose~10lbs and increase strength and stamina. Long: goal wt @220lbs    Intervention Provide nutrition and exercise guidelines to assist with weightloss and improving cardiovascular fitness   Expected Outcomes Pt will be able to lose  wt and increase strength and stamina      Core Components/Risk Factors/Patient Goals Review:    Core Components/Risk Factors/Patient Goals at Discharge (Final Review):    ITP Comments:     ITP Comments    Row Name 09/19/16 1341           ITP Comments Medical Director- Dr. Fransico Him, MD.          Comments: Patient attended orientation from 1330 to 1530 to review rules and guidelines for program. Completed 6 minute walk test, Intitial ITP, and exercise prescription.  VSS. Telemetry-sinus rhythm, first degree AV block  Asymptomatic.

## 2016-09-25 ENCOUNTER — Encounter (HOSPITAL_COMMUNITY): Payer: BLUE CROSS/BLUE SHIELD

## 2016-09-25 ENCOUNTER — Encounter (HOSPITAL_COMMUNITY)
Admission: RE | Admit: 2016-09-25 | Discharge: 2016-09-25 | Disposition: A | Discharge status: Home / Self Care | Payer: BLUE CROSS/BLUE SHIELD | Source: Ambulatory Visit | Attending: Cardiology | Admitting: Cardiology

## 2016-09-25 DIAGNOSIS — I214 Non-ST elevation (NSTEMI) myocardial infarction: Secondary | ICD-10-CM

## 2016-09-25 DIAGNOSIS — Z955 Presence of coronary angioplasty implant and graft: Secondary | ICD-10-CM

## 2016-09-25 NOTE — Progress Notes (Signed)
Daily Session Note  Patient Details  Name: Jacob Quinn MRN: [id number] Date of Birth: 06/26/1955 Referring Provider:   Flowsheet Row CARDIAC REHAB PHASE II ORIENTATION from 09/19/2016 in [place]  Referring Provider  Shelva Majestic MD      Encounter Date: 09/25/2016  Check In:     Session Check In - 09/25/16 0752      Check-In   Location [facility]   Staff Present Cleda Mccreedy, MS, Exercise Physiologist;Yoshiye Kraft Wilber Oliphant, RN, Luisa Hart, RN, BSN;Joann Rion, RN, BSN   Supervising physician immediately available to respond to emergencies [facility] immediately available   Physician(s) Dr. Cathlean Sauer   Medication changes reported     No   Fall or balance concerns reported    No   Warm-up and Cool-down Performed as group-led instruction   Resistance Training Performed Yes   VAD Patient? No     Pain Assessment   Currently in Pain? No/denies      Capillary Blood Glucose: No results found for this or any previous visit (from the past 24 hour(s)).   Goals Met:  Exercise tolerated well Personal goals reviewed  Goals Unmet:  Not Applicable  Comments:  Pt started cardiac rehab today.  Pt tolerated light exercise without difficulty. VSS with elevation in bp noted. Pt did not take his medications prior to coming to exercise.  Pt was in a hurry and forgot.  Telemetry-Sr with no noted ectopy, asymptomatic.  Medication list reconciled. Pt denies barriers to medicaiton compliance.  PSYCHOSOCIAL ASSESSMENT:  PHQ-0. Pt exhibits positive coping skills, hopeful outlook with supportive family which includes his fiancee and children.  QUALITY OF LIFE SCORE REVIEW  Pt completed Quality of Life survey as a participant in Cardiac Rehab. Scores 21.0 or below are considered low. Pt scored the following     Quality of Life - 09/19/16 1642      Quality of Life Scores   Health/Function Pre 24.8 %   Socioeconomic Pre 24.56 %    Psych/Spiritual Pre 24 %   Family Pre 30 %   GLOBAL Pre 25.33 %      No psychosocial needs identified at this time, no psychosocial interventions necessary.    Pt enjoys bowling, watching sports.   Pt desires to to lose 10 pounds.  Increase strength and stamina  Long term goal is a weight of 220 pounds,  Pt presently weighs about 230.  Will monitor pt progress toward achieving these goal.  Pt oriented to exercise equipment and routine.    Understanding verbalized. Maurice Small RN, BSN     Dr. Fransico Him is Medical Director for Cardiac Rehab at [facility].

## 2016-09-27 ENCOUNTER — Encounter (HOSPITAL_COMMUNITY): Payer: BLUE CROSS/BLUE SHIELD

## 2016-09-27 ENCOUNTER — Encounter (HOSPITAL_COMMUNITY)
Admission: RE | Admit: 2016-09-27 | Discharge: 2016-09-27 | Disposition: A | Discharge status: Home / Self Care | Payer: BLUE CROSS/BLUE SHIELD | Source: Ambulatory Visit | Attending: Cardiology | Admitting: Cardiology

## 2016-09-27 DIAGNOSIS — I214 Non-ST elevation (NSTEMI) myocardial infarction: Secondary | ICD-10-CM | POA: Diagnosis not present

## 2016-09-27 DIAGNOSIS — Z955 Presence of coronary angioplasty implant and graft: Secondary | ICD-10-CM

## 2016-09-29 ENCOUNTER — Encounter (HOSPITAL_COMMUNITY): Payer: BLUE CROSS/BLUE SHIELD

## 2016-09-29 ENCOUNTER — Encounter (HOSPITAL_COMMUNITY)
Admission: RE | Admit: 2016-09-29 | Discharge: 2016-09-29 | Disposition: A | Discharge status: Home / Self Care | Payer: BLUE CROSS/BLUE SHIELD | Source: Ambulatory Visit | Attending: Cardiology | Admitting: Cardiology

## 2016-09-29 DIAGNOSIS — I214 Non-ST elevation (NSTEMI) myocardial infarction: Secondary | ICD-10-CM

## 2016-09-29 DIAGNOSIS — Z955 Presence of coronary angioplasty implant and graft: Secondary | ICD-10-CM

## 2016-10-02 ENCOUNTER — Encounter (HOSPITAL_COMMUNITY)
Admission: RE | Admit: 2016-10-02 | Discharge: 2016-10-02 | Disposition: A | Discharge status: Home / Self Care | Payer: BLUE CROSS/BLUE SHIELD | Source: Ambulatory Visit | Attending: Cardiology | Admitting: Cardiology

## 2016-10-02 ENCOUNTER — Encounter (HOSPITAL_COMMUNITY): Payer: BLUE CROSS/BLUE SHIELD

## 2016-10-02 DIAGNOSIS — I214 Non-ST elevation (NSTEMI) myocardial infarction: Secondary | ICD-10-CM | POA: Diagnosis not present

## 2016-10-02 DIAGNOSIS — Z955 Presence of coronary angioplasty implant and graft: Secondary | ICD-10-CM

## 2016-10-04 ENCOUNTER — Encounter (HOSPITAL_COMMUNITY): Payer: BLUE CROSS/BLUE SHIELD

## 2016-10-04 ENCOUNTER — Encounter (HOSPITAL_COMMUNITY)
Admission: RE | Admit: 2016-10-04 | Discharge: 2016-10-04 | Disposition: A | Discharge status: Home / Self Care | Payer: BLUE CROSS/BLUE SHIELD | Source: Ambulatory Visit | Attending: Cardiology | Admitting: Cardiology

## 2016-10-04 DIAGNOSIS — Z955 Presence of coronary angioplasty implant and graft: Secondary | ICD-10-CM

## 2016-10-04 DIAGNOSIS — I214 Non-ST elevation (NSTEMI) myocardial infarction: Secondary | ICD-10-CM | POA: Diagnosis not present

## 2016-10-05 NOTE — Progress Notes (Signed)
Cardiac Individual Treatment Plan  Patient Details  Name: Jacob Quinn MRN: [id number] Date of Birth: 06/26/1955 Referring Provider:   Flowsheet Row CARDIAC REHAB PHASE II ORIENTATION from 09/19/2016 in [facility]  Referring Provider  Shelva Majestic MD      Initial Encounter Date:  [facility] PHASE II ORIENTATION from 09/19/2016 in [facility]  Date  09/19/16  Referring Provider  Shelva Majestic MD      Visit Diagnosis: 08/07/16 NSTEMI (non-ST elevated myocardial infarction) ([facility])  08/07/16 Status post insertion of drug eluting coronary artery stent  Patient's Home Medications on Admission:  Current Outpatient Prescriptions:  .  amLODipine (NORVASC) 10 MG tablet, Take 1 tablet (10 mg total) by mouth daily., Disp: 30 tablet, Rfl: 6 .  aspirin EC 81 MG EC tablet, Take 1 tablet (81 mg total) by mouth daily., Disp: 30 tablet, Rfl: 11 .  atorvastatin (LIPITOR) 80 MG tablet, Take 1 tablet (80 mg total) by mouth every evening., Disp: 30 tablet, Rfl: 6 .  ibuprofen (ADVIL,MOTRIN) 800 MG tablet, Take 1 tablet (800 mg total) by mouth every 8 (eight) hours as needed., Disp: 30 tablet, Rfl: 0 .  lisinopril (PRINIVIL,ZESTRIL) 20 MG tablet, Take 1 tablet (20 mg total) by mouth daily., Disp: 30 tablet, Rfl: 0 .  Multiple Vitamin (MULTI VITAMIN DAILY) TABS, Take 1 tablet by mouth daily., Disp: , Rfl:  .  nitroGLYCERIN (NITROSTAT) 0.4 MG SL tablet, Place 1 tablet (0.4 mg total) under the tongue every 5 (five) minutes as needed for chest pain. Up to 3 doses., Disp: 25 tablet, Rfl: 3 .  ticagrelor (BRILINTA) 90 MG TABS tablet, Take 1 tablet (90 mg total) by mouth every 12 (twelve) hours., Disp: 180 tablet, Rfl: 3  Past Medical History: Past Medical History:  Diagnosis Date  . BPH associated with nocturia   . CAD in native artery    a. NSTEMI 07/2016 -  LHC 08/07/16 showing 70% D1, 20% mLAD, segmental 95% then 75% OM  stenosis, 85% acute marg, normal LVEDP - received PTCA/DES to OM.  Marland Kitchen Encephalitis, viral   . GERD (gastroesophageal reflux disease)   . Hyperlipidemia 08/07/2016  . Hypertension   . Ischemic cardiomyopathy    a. LHC 07/2016 - mild mid anterolateral focal hypocontractility felt to be due to the circumflex stenosis, EF 50%.  . OA (osteoarthritis)   . Obesity (BMI 30-39.9) 08/07/2016  . Renal insufficiency    a. 2016 r/t dehydration/ACEI use.  Marland Kitchen Restless leg syndrome 09/16/2010  . Rheumatic fever   . Syncope     Tobacco Use: History  Smoking Status  . Never Smoker  Smokeless Tobacco  . Never Used    Labs: Recent Review Flowsheet Data    Labs for ITP Cardiac and Pulmonary Rehab Latest Ref Rng & Units 01/12/2014 03/17/2014 01/30/2015 05/13/2015 08/07/2016   Cholestrol 0 - 200 mg/dL 200 208(H) - 200 216(H)   LDLCALC 0 - 99 mg/dL 120(H) 146(H) - 133(H) 156(H)   LDLDIRECT mg/dL - - - - -   HDL >40 mg/dL 39.00(L) 45.20 - 39.20 41   Trlycerides <150 mg/dL 207.0(H) 86.0 - 139.0 96   Hemoglobin A1c 4.8 - 5.6 % - - 5.6 - -      Capillary Blood Glucose: No results found for: GLUCAP   Exercise Target Goals:    Exercise Program Goal: Individual exercise prescription set with THRR, safety & activity barriers. Participant demonstrates ability to understand and  report RPE using BORG scale, to self-measure pulse accurately, and to acknowledge the importance of the exercise prescription.  Exercise Prescription Goal: Starting with aerobic activity 30 plus minutes a day, 3 days per week for initial exercise prescription. Provide home exercise prescription and guidelines that participant acknowledges understanding prior to discharge.  Activity Barriers & Risk Stratification:     Activity Barriers & Cardiac Risk Stratification - 09/19/16 1449      Activity Barriers & Cardiac Risk Stratification   Activity Barriers Joint Problems;Deconditioning;Muscular Weakness;Right Knee Replacement;Left Knee  Replacement  partial L knee replacement, R TKR x2   Cardiac Risk Stratification Moderate      6 Minute Walk:     6 Minute Walk    Row Name 09/19/16 1632         6 Minute Walk   Phase Initial     Distance 1534 feet     Walk Time 6 minutes     # of Rest Breaks 0     MPH 2.9     METS 3.17     RPE 13     VO2 Peak 12.97     Symptoms No     Resting HR 65 bpm     Resting BP 128/84     Max Ex. HR 108 bpm     Max Ex. BP 150/90     2 Minute Post BP 134/80        Initial Exercise Prescription:     Initial Exercise Prescription - 09/19/16 1600      Date of Initial Exercise RX and Referring Provider   Date 09/19/16   Referring Provider Shelva Majestic MD     NuStep   Level 2   Minutes 10   METs 2     Arm Ergometer   Level 1   Minutes 10   METs 2     Track   Laps 10   Minutes 10   METs 2.74     Prescription Details   Frequency (times per week) 3   Duration Progress to 30 minutes of continuous aerobic without signs/symptoms of physical distress     Intensity   THRR 40-80% of Max Heartrate 63-126   Ratings of Perceived Exertion 11-13     Progression   Progression Continue progressive overload as per policy without signs/symptoms or physical distress.     Resistance Training   Training Prescription Yes   Weight 2lbs   Reps 10-12      Perform Capillary Blood Glucose checks as needed.  Exercise Prescription Changes:     Exercise Prescription Changes    Row Name 10/05/16 1200             Resistance Training   Training Prescription Yes       Weight 2lbs       Reps 10-12         NuStep   Level 2       Minutes 10       METs 2         Arm Ergometer   Level 1       Minutes 10       METs 2         Track   Laps 10       Minutes 10       METs 2.74          Exercise Comments:     Exercise Comments    Row Name 10/05/16 1207  Exercise Comments No exercise changes at this time, will continue to monitor.  Pt is off to a great  start with exercise.           Discharge Exercise Prescription (Final Exercise Prescription Changes):     Exercise Prescription Changes - 10/05/16 1200      Resistance Training   Training Prescription Yes   Weight 2lbs   Reps 10-12     NuStep   Level 2   Minutes 10   METs 2     Arm Ergometer   Level 1   Minutes 10   METs 2     Track   Laps 10   Minutes 10   METs 2.74      Nutrition:  Target Goals: Understanding of nutrition guidelines, daily intake of sodium 1500mg , cholesterol 200mg , calories 30% from fat and 7% or less from saturated fats, daily to have 5 or more servings of fruits and vegetables.  Biometrics:      Post Biometrics - 09/19/16 1636       Post  Biometrics   Waist Circumference 42.5 inches   Hip Circumference 45 inches   Waist to Hip Ratio 0.94 %   Triceps Skinfold 19 mm   % Body Fat 30.6 %   Grip Strength 46 kg   Flexibility 14 in   Single Leg Stand 2.75 seconds      Nutrition Therapy Plan and Nutrition Goals:     Nutrition Therapy & Goals - 10/04/16 1349      Nutrition Therapy   Diet Therapeutic Lifestyle Changes     Personal Nutrition Goals   Personal Goal #1 1-2 lb wt loss/week to a wt loss goal of 6-24 lb at graduation from [facility], educate and counsel regarding individualized specific dietary modifications aiming towards targeted core components such as weight, hypertension, lipid management, diabetes, heart failure and other comorbidities.   Expected Outcomes Short Term Goal: Understand basic principles of dietary content, such as calories, fat, sodium, cholesterol and nutrients.;Long Term Goal: Adherence to prescribed nutrition plan.      Nutrition Discharge: Nutrition Scores:     Nutrition Assessments - 10/04/16 1349      MEDFICTS Scores   Pre Score 33      Nutrition Goals Re-Evaluation:   Psychosocial: Target Goals: Acknowledge presence or absence of  depression, maximize coping skills, provide positive support system. Participant is able to verbalize types and ability to use techniques and skills needed for reducing stress and depression.  Initial Review & Psychosocial Screening:     Initial Psych Review & Screening - 10/05/16 [place]? Yes      Quality of Life Scores:     Quality of Life - 09/19/16 1642      Quality of Life Scores   Health/Function Pre 24.8 %   Socioeconomic Pre 24.56 %   Psych/Spiritual Pre 24 %   Family Pre 30 %   GLOBAL Pre 25.33 %      PHQ-9: Recent Review Flowsheet Data    Depression screen PHQ 2/9 09/25/2016   Decreased Interest 1   Down, Depressed, Hopeless 1   PHQ - 2 Score 2      Psychosocial Evaluation and Intervention:     Psychosocial Evaluation - 10/05/16 1346      Psychosocial Evaluation & Interventions   Interventions Stress management education;Encouraged to exercise with  the program and follow exercise prescription;Relaxation education      Psychosocial Re-Evaluation:     Psychosocial Re-Evaluation    [place] Name 10/05/16 1346             Psychosocial Re-Evaluation   Interventions Encouraged to attend Cardiac Rehabilitation for the exercise;Stress management education;Relaxation education          Vocational Rehabilitation: Provide vocational rehab assistance to qualifying candidates.   Vocational Rehab Evaluation & Intervention:     Vocational Rehab - 09/19/16 1702      Initial Vocational Rehab Evaluation & Intervention   Assessment shows need for Vocational Rehabilitation No      Education: Education Goals: Education classes will be provided on a weekly basis, covering required topics. Participant will state understanding/return demonstration of topics presented.  Learning Barriers/Preferences:     Learning Barriers/Preferences - 09/19/16 1450      Learning Barriers/Preferences   Learning Barriers Sight    Learning Preferences Skilled Demonstration;Pictoral;Video      Education Topics: Count Your Pulse:  -Group instruction provided by verbal instruction, demonstration, patient participation and written materials to support subject.  Instructors address importance of being able to find your pulse and how to count your pulse when at home without a heart monitor.  Patients get hands on experience counting their pulse with staff help and individually.   Heart Attack, Angina, and Risk Factor Modification:  -Group instruction provided by verbal instruction, video, and written materials to support subject.  Instructors address signs and symptoms of angina and heart attacks.    Also discuss risk factors for heart disease and how to make changes to improve heart health risk factors.   Functional Fitness:  -Group instruction provided by verbal instruction, demonstration, patient participation, and written materials to support subject.  Instructors address safety measures for doing things around the house.  Discuss how to get up and down off the floor, how to pick things up properly, how to safely get out of a chair without assistance, and balance training.   Meditation and Mindfulness:  -Group instruction provided by verbal instruction, patient participation, and written materials to support subject.  Instructor addresses importance of mindfulness and meditation practice to help reduce stress and improve awareness.  Instructor also leads participants through a meditation exercise.    Stretching for Flexibility and Mobility:  -Group instruction provided by verbal instruction, patient participation, and written materials to support subject.  Instructors lead participants through series of stretches that are designed to increase flexibility thus improving mobility.  These stretches are additional exercise for major muscle groups that are typically performed during regular warm up and cool down.   Hands  Only CPR Anytime:  -Group instruction provided by verbal instruction, video, patient participation and written materials to support subject.  Instructors co-teach with AHA video for hands only CPR.  Participants get hands on experience with mannequins.   Nutrition I class: Heart Healthy Eating:  -Group instruction provided by PowerPoint slides, verbal discussion, and written materials to support subject matter. The instructor gives an explanation and review of the Therapeutic Lifestyle Changes diet recommendations, which includes a discussion on lipid goals, dietary fat, sodium, fiber, plant stanol/sterol esters, sugar, and the components of a well-balanced, healthy diet.   Nutrition II class: Lifestyle Skills:  -Group instruction provided by PowerPoint slides, verbal discussion, and written materials to support subject matter. The instructor gives an explanation and review of label reading, grocery shopping for heart health, heart healthy recipe modifications, and ways  to make healthier choices when eating out.   Diabetes Question & Answer:  -Group instruction provided by PowerPoint slides, verbal discussion, and written materials to support subject matter. The instructor gives an explanation and review of diabetes co-morbidities, pre- and post-prandial blood glucose goals, pre-exercise blood glucose goals, signs, symptoms, and treatment of hypoglycemia and hyperglycemia, and foot care basics.   Diabetes Blitz:  -Group instruction provided by PowerPoint slides, verbal discussion, and written materials to support subject matter. The instructor gives an explanation and review of the physiology behind type 1 and type 2 diabetes, diabetes medications and rational behind using different medications, pre- and post-prandial blood glucose recommendations and Hemoglobin A1c goals, diabetes diet, and exercise including blood glucose guidelines for exercising safely.    Portion Distortion:  -Group  instruction provided by PowerPoint slides, verbal discussion, written materials, and food models to support subject matter. The instructor gives an explanation of serving size versus portion size, changes in portions sizes over the last 20 years, and what consists of a serving from each food group.   Stress Management:  -Group instruction provided by verbal instruction, video, and written materials to support subject matter.  Instructors review role of stress in heart disease and how to cope with stress positively.     Exercising on Your Own:  -Group instruction provided by verbal instruction, power point, and written materials to support subject.  Instructors discuss benefits of exercise, components of exercise, frequency and intensity of exercise, and end points for exercise.  Also discuss use of nitroglycerin and activating EMS.  Review options of places to exercise outside of rehab.  Review guidelines for sex with heart disease.   Cardiac Drugs I:  -Group instruction provided by verbal instruction and written materials to support subject.  Instructor reviews cardiac drug classes: antiplatelets, anticoagulants, beta blockers, and statins.  Instructor discusses reasons, side effects, and lifestyle considerations for each drug class.   Cardiac Drugs II:  -Group instruction provided by verbal instruction and written materials to support subject.  Instructor reviews cardiac drug classes: angiotensin converting enzyme inhibitors (ACE-I), angiotensin II receptor blockers (ARBs), nitrates, and calcium channel blockers.  Instructor discusses reasons, side effects, and lifestyle considerations for each drug class.   Anatomy and Physiology of the Circulatory System:  -Group instruction provided by verbal instruction, video, and written materials to support subject.  Reviews functional anatomy of heart, how it relates to various diagnoses, and what role the heart plays in the overall  system.   Knowledge Questionnaire Score:     Knowledge Questionnaire Score - 09/19/16 1631      Knowledge Questionnaire Score   Pre Score 24/24      Core Components/Risk Factors/Patient Goals at Admission:     Personal Goals and Risk Factors at Admission - 09/19/16 1451      Core Components/Risk Factors/Patient Goals on Admission    Weight Management Yes;Weight Loss   Intervention Weight Management: Develop a combined nutrition and exercise program designed to reach desired caloric intake, while maintaining appropriate intake of nutrient and fiber, sodium and fats, and appropriate energy expenditure required for the weight goal.;Obesity: Provide education and appropriate resources to help participant work on and attain dietary goals.   Expected Outcomes Weight Maintenance: Understanding of the daily nutrition guidelines, which includes 25-35% calories from fat, 7% or less cal from saturated fats, less than 200mg  cholesterol, less than 1.5gm of sodium, & 5 or more servings of fruits and vegetables daily;Long Term: Adherence to nutrition and physical activity/exercise  program aimed toward attainment of established weight goal;Short Term: Continue to assess and modify interventions until short term weight is achieved;Weight Loss: Understanding of general recommendations for a balanced deficit meal plan, which promotes 1-2 lb weight loss per week and includes a negative energy balance of [id number] kcal/d;Understanding recommendations for meals to include 15-35% energy as protein, 25-35% energy from fat, 35-60% energy from carbohydrates, less than 200mg  of dietary cholesterol, 20-35 gm of total fiber daily;Understanding of distribution of calorie intake throughout the day with the consumption of 4-5 meals/snacks;Weight Gain: Understanding of general recommendations for a high calorie, high protein meal plan that promotes weight gain by distributing calorie intake throughout the day with the  consumption for 4-5 meals, snacks, and/or supplements   Sedentary Yes   Intervention Provide advice, education, support and counseling about physical activity/exercise needs.;Develop an individualized exercise prescription for aerobic and resistive training based on initial evaluation findings, risk stratification, comorbidities and participant's personal goals.   Expected Outcomes Achievement of increased cardiorespiratory fitness and enhanced flexibility, muscular endurance and strength shown through measurements of functional capacity and personal statement of participant.   Increase Strength and Stamina Yes   Intervention Provide advice, education, support and counseling about physical activity/exercise needs.;Develop an individualized exercise prescription for aerobic and resistive training based on initial evaluation findings, risk stratification, comorbidities and participant's personal goals.   Expected Outcomes Achievement of increased cardiorespiratory fitness and enhanced flexibility, muscular endurance and strength shown through measurements of functional capacity and personal statement of participant.   Improve shortness of breath with ADL's Yes   Intervention Provide education, individualized exercise plan and daily activity instruction to help decrease symptoms of SOB with activities of daily living.   Expected Outcomes Short Term: Achieves a reduction of symptoms when performing activities of daily living.   Hypertension Yes   Intervention Provide education on lifestyle modifcations including regular physical activity/exercise, weight management, moderate sodium restriction and increased consumption of fresh fruit, vegetables, and low fat dairy, alcohol moderation, and smoking cessation.;Monitor prescription use compliance.   Expected Outcomes Long Term: Maintenance of blood pressure at goal levels.;Short Term: Continued assessment and intervention until BP is < 140/21mm HG in  hypertensive participants. < 130/41mm HG in hypertensive participants with diabetes, heart failure or chronic kidney disease.   Lipids Yes   Intervention Provide education and support for participant on nutrition & aerobic/resistive exercise along with prescribed medications to achieve LDL 70mg , HDL >40mg .   Expected Outcomes Short Term: Participant states understanding of desired cholesterol values and is compliant with medications prescribed. Participant is following exercise prescription and nutrition guidelines.;Long Term: Cholesterol controlled with medications as prescribed, with individualized exercise RX and with personalized nutrition plan. Value goals: LDL < 70mg , HDL > 40 mg.   Personal Goal short: lose~10lbs and increase strength and stamina. Long: goal wt @220lbs    Intervention Provide nutrition and exercise guidelines to assist with weightloss and improving cardiovascular fitness   Expected Outcomes Pt will be able to lose wt and increase strength and stamina      Core Components/Risk Factors/Patient Goals Review:    Core Components/Risk Factors/Patient Goals at Discharge (Final Review):    ITP Comments:     ITP Comments    Row Name 09/19/16 1341           ITP Comments Medical Director- Dr. Fransico Him, MD.          Comments:  Pt is making expected progress toward personal goals after completing 6 sessions. Continue to monitor  pt bp..Repeat Psychosocial Assessment: Pt with supportive family, denies any Psychosocial needs or interventions at this time.  Recommend continued exercise and life style modification education including  stress management and relaxation techniques to decrease cardiac risk profile. Cherre Huger, BSN

## 2016-10-06 ENCOUNTER — Encounter (HOSPITAL_COMMUNITY)
Admission: RE | Admit: 2016-10-06 | Discharge: 2016-10-06 | Disposition: A | Discharge status: Home / Self Care | Payer: BLUE CROSS/BLUE SHIELD | Source: Ambulatory Visit | Attending: Cardiology | Admitting: Cardiology

## 2016-10-06 ENCOUNTER — Encounter (HOSPITAL_COMMUNITY): Payer: BLUE CROSS/BLUE SHIELD

## 2016-10-06 DIAGNOSIS — Z955 Presence of coronary angioplasty implant and graft: Secondary | ICD-10-CM

## 2016-10-06 DIAGNOSIS — I214 Non-ST elevation (NSTEMI) myocardial infarction: Secondary | ICD-10-CM

## 2016-10-09 ENCOUNTER — Encounter (HOSPITAL_COMMUNITY)
Admission: RE | Admit: 2016-10-09 | Discharge: 2016-10-09 | Disposition: A | Discharge status: Home / Self Care | Payer: BLUE CROSS/BLUE SHIELD | Source: Ambulatory Visit | Attending: Cardiology | Admitting: Cardiology

## 2016-10-09 ENCOUNTER — Encounter (HOSPITAL_COMMUNITY): Payer: BLUE CROSS/BLUE SHIELD

## 2016-10-09 DIAGNOSIS — I214 Non-ST elevation (NSTEMI) myocardial infarction: Secondary | ICD-10-CM

## 2016-10-09 DIAGNOSIS — Z955 Presence of coronary angioplasty implant and graft: Secondary | ICD-10-CM

## 2016-10-11 ENCOUNTER — Encounter (HOSPITAL_COMMUNITY): Payer: BLUE CROSS/BLUE SHIELD

## 2016-10-11 ENCOUNTER — Encounter (HOSPITAL_COMMUNITY)
Admission: RE | Admit: 2016-10-11 | Discharge: 2016-10-11 | Disposition: A | Discharge status: Home / Self Care | Payer: BLUE CROSS/BLUE SHIELD | Source: Ambulatory Visit | Attending: Cardiology | Admitting: Cardiology

## 2016-10-11 DIAGNOSIS — I214 Non-ST elevation (NSTEMI) myocardial infarction: Secondary | ICD-10-CM | POA: Diagnosis not present

## 2016-10-11 DIAGNOSIS — Z955 Presence of coronary angioplasty implant and graft: Secondary | ICD-10-CM

## 2016-10-11 NOTE — Progress Notes (Signed)
Reviewed home exercise program with pt.  Discussed mode/frequency of exercise, THRR, RPE scale and weather conditions for exercising outside.  Also discussed signs and symptoms, NTG use and when to call Dr/911.  Pt verbalized understanding.  Cleda Mccreedy 10/11/16 08:08

## 2016-10-13 ENCOUNTER — Encounter (HOSPITAL_COMMUNITY): Payer: BLUE CROSS/BLUE SHIELD

## 2016-10-13 ENCOUNTER — Encounter (HOSPITAL_COMMUNITY)
Admission: RE | Admit: 2016-10-13 | Discharge: 2016-10-13 | Disposition: A | Discharge status: Home / Self Care | Payer: BLUE CROSS/BLUE SHIELD | Source: Ambulatory Visit | Attending: Cardiology | Admitting: Cardiology

## 2016-10-13 DIAGNOSIS — I214 Non-ST elevation (NSTEMI) myocardial infarction: Secondary | ICD-10-CM | POA: Diagnosis not present

## 2016-10-13 DIAGNOSIS — Z955 Presence of coronary angioplasty implant and graft: Secondary | ICD-10-CM

## 2016-10-16 ENCOUNTER — Encounter (HOSPITAL_COMMUNITY)
Admission: RE | Admit: 2016-10-16 | Discharge: 2016-10-16 | Disposition: A | Discharge status: Home / Self Care | Payer: BLUE CROSS/BLUE SHIELD | Source: Ambulatory Visit | Attending: Cardiology | Admitting: Cardiology

## 2016-10-16 ENCOUNTER — Encounter (HOSPITAL_COMMUNITY): Payer: BLUE CROSS/BLUE SHIELD

## 2016-10-16 DIAGNOSIS — Z955 Presence of coronary angioplasty implant and graft: Secondary | ICD-10-CM

## 2016-10-16 DIAGNOSIS — I214 Non-ST elevation (NSTEMI) myocardial infarction: Secondary | ICD-10-CM

## 2016-10-18 ENCOUNTER — Encounter (HOSPITAL_COMMUNITY): Payer: BLUE CROSS/BLUE SHIELD

## 2016-10-18 ENCOUNTER — Encounter (HOSPITAL_COMMUNITY)
Admission: RE | Admit: 2016-10-18 | Discharge: 2016-10-18 | Disposition: A | Discharge status: Home / Self Care | Payer: BLUE CROSS/BLUE SHIELD | Source: Ambulatory Visit | Attending: Cardiology | Admitting: Cardiology

## 2016-10-18 DIAGNOSIS — I214 Non-ST elevation (NSTEMI) myocardial infarction: Secondary | ICD-10-CM | POA: Diagnosis not present

## 2016-10-18 DIAGNOSIS — Z955 Presence of coronary angioplasty implant and graft: Secondary | ICD-10-CM

## 2016-10-20 ENCOUNTER — Encounter (HOSPITAL_COMMUNITY)
Admission: RE | Admit: 2016-10-20 | Discharge: 2016-10-20 | Disposition: A | Discharge status: Home / Self Care | Payer: BLUE CROSS/BLUE SHIELD | Source: Ambulatory Visit | Attending: Cardiology | Admitting: Cardiology

## 2016-10-20 ENCOUNTER — Encounter (HOSPITAL_COMMUNITY): Payer: BLUE CROSS/BLUE SHIELD

## 2016-10-23 ENCOUNTER — Encounter (HOSPITAL_COMMUNITY): Payer: BLUE CROSS/BLUE SHIELD

## 2016-10-25 ENCOUNTER — Encounter (HOSPITAL_COMMUNITY): Payer: BLUE CROSS/BLUE SHIELD

## 2016-10-25 ENCOUNTER — Encounter (HOSPITAL_COMMUNITY)
Admission: RE | Admit: 2016-10-25 | Discharge: 2016-10-25 | Disposition: A | Discharge status: Home / Self Care | Payer: BLUE CROSS/BLUE SHIELD | Source: Ambulatory Visit | Attending: Cardiology | Admitting: Cardiology

## 2016-10-25 DIAGNOSIS — I214 Non-ST elevation (NSTEMI) myocardial infarction: Secondary | ICD-10-CM | POA: Diagnosis not present

## 2016-10-25 DIAGNOSIS — Z955 Presence of coronary angioplasty implant and graft: Secondary | ICD-10-CM

## 2016-10-27 ENCOUNTER — Encounter (HOSPITAL_COMMUNITY)
Admission: RE | Admit: 2016-10-27 | Discharge: 2016-10-27 | Disposition: A | Discharge status: Home / Self Care | Payer: BLUE CROSS/BLUE SHIELD | Source: Ambulatory Visit | Attending: Cardiology | Admitting: Cardiology

## 2016-10-27 ENCOUNTER — Encounter (HOSPITAL_COMMUNITY): Payer: BLUE CROSS/BLUE SHIELD

## 2016-10-27 ENCOUNTER — Encounter (HOSPITAL_COMMUNITY): Admission: RE | Admit: 2016-10-27 | Payer: BLUE CROSS/BLUE SHIELD | Source: Ambulatory Visit

## 2016-10-27 DIAGNOSIS — I214 Non-ST elevation (NSTEMI) myocardial infarction: Secondary | ICD-10-CM | POA: Diagnosis not present

## 2016-10-27 DIAGNOSIS — Z955 Presence of coronary angioplasty implant and graft: Secondary | ICD-10-CM

## 2016-10-30 ENCOUNTER — Encounter (HOSPITAL_COMMUNITY): Payer: BLUE CROSS/BLUE SHIELD

## 2016-11-01 ENCOUNTER — Encounter (HOSPITAL_COMMUNITY): Payer: BLUE CROSS/BLUE SHIELD

## 2016-11-02 ENCOUNTER — Encounter (HOSPITAL_COMMUNITY): Payer: Self-pay | Admitting: *Deleted

## 2016-11-02 NOTE — Progress Notes (Signed)
Cardiac Individual Treatment Plan  Patient Details  Name: Jacob Quinn MRN: [id number] Date of Birth: 06/26/1955 Referring Provider:   Flowsheet Row CARDIAC REHAB PHASE II ORIENTATION from 09/19/2016 in [facility]  Referring Provider  Shelva Majestic MD      Initial Encounter Date:  [place] PHASE II ORIENTATION from 09/19/2016 in [facility]  Date  09/19/16  Referring Provider  Shelva Majestic MD      Visit Diagnosis: No diagnosis found.  Patient's Home Medications on Admission:  Current Outpatient Prescriptions:  .  amLODipine (NORVASC) 10 MG tablet, Take 1 tablet (10 mg total) by mouth daily., Disp: 30 tablet, Rfl: 6 .  aspirin EC 81 MG EC tablet, Take 1 tablet (81 mg total) by mouth daily., Disp: 30 tablet, Rfl: 11 .  atorvastatin (LIPITOR) 80 MG tablet, Take 1 tablet (80 mg total) by mouth every evening., Disp: 30 tablet, Rfl: 6 .  ibuprofen (ADVIL,MOTRIN) 800 MG tablet, Take 1 tablet (800 mg total) by mouth every 8 (eight) hours as needed., Disp: 30 tablet, Rfl: 0 .  lisinopril (PRINIVIL,ZESTRIL) 20 MG tablet, Take 1 tablet (20 mg total) by mouth daily., Disp: 30 tablet, Rfl: 0 .  Multiple Vitamin (MULTI VITAMIN DAILY) TABS, Take 1 tablet by mouth daily., Disp: , Rfl:  .  nitroGLYCERIN (NITROSTAT) 0.4 MG SL tablet, Place 1 tablet (0.4 mg total) under the tongue every 5 (five) minutes as needed for chest pain. Up to 3 doses., Disp: 25 tablet, Rfl: 3 .  ticagrelor (BRILINTA) 90 MG TABS tablet, Take 1 tablet (90 mg total) by mouth every 12 (twelve) hours., Disp: 180 tablet, Rfl: 3  Past Medical History: Past Medical History:  Diagnosis Date  . BPH associated with nocturia   . CAD in native artery    a. NSTEMI 07/2016 -  LHC 08/07/16 showing 70% D1, 20% mLAD, segmental 95% then 75% OM stenosis, 85% acute marg, normal LVEDP - received PTCA/DES to OM.  [place] Encephalitis, viral   . GERD  (gastroesophageal reflux disease)   . Hyperlipidemia 08/07/2016  . Hypertension   . Ischemic cardiomyopathy    a. LHC 07/2016 - mild mid anterolateral focal hypocontractility felt to be due to the circumflex stenosis, EF 50%.  . OA (osteoarthritis)   . Obesity (BMI 30-39.9) 08/07/2016  . Renal insufficiency    a. 2016 r/t dehydration/ACEI use.  [place] Restless leg syndrome 09/16/2010  . Rheumatic fever   . Syncope     Tobacco Use: History  Smoking Status  . Never Smoker  Smokeless Tobacco  . Never Used    Labs: Recent Review Flowsheet Data    Labs for ITP Cardiac and Pulmonary Rehab Latest Ref Rng & Units 01/12/2014 03/17/2014 01/30/2015 05/13/2015 08/07/2016   Cholestrol 0 - 200 mg/dL 200 208(H) - 200 216(H)   LDLCALC 0 - 99 mg/dL 120(H) 146(H) - 133(H) 156(H)   LDLDIRECT mg/dL - - - - -   HDL >40 mg/dL 39.00(L) 45.20 - 39.20 41   Trlycerides <150 mg/dL 207.0(H) 86.0 - 139.0 96   Hemoglobin A1c 4.8 - 5.6 % - - 5.6 - -      Capillary Blood Glucose: No results found for: GLUCAP   Exercise Target Goals:    Exercise Program Goal: Individual exercise prescription set with THRR, safety & activity barriers. Participant demonstrates ability to understand and report RPE using BORG scale, to self-measure pulse accurately, and to acknowledge the importance of  the exercise prescription.  Exercise Prescription Goal: Starting with aerobic activity 30 plus minutes a day, 3 days per week for initial exercise prescription. Provide home exercise prescription and guidelines that participant acknowledges understanding prior to discharge.  Activity Barriers & Risk Stratification:     Activity Barriers & Cardiac Risk Stratification - 09/19/16 1449      Activity Barriers & Cardiac Risk Stratification   Activity Barriers Joint Problems;Deconditioning;Muscular Weakness;Right Knee Replacement;Left Knee Replacement  partial L knee replacement, R TKR x2   Cardiac Risk Stratification Moderate       6 Minute Walk:     6 Minute Walk    Row Name 09/19/16 1632         6 Minute Walk   Phase Initial     Distance 1534 feet     Walk Time 6 minutes     # of Rest Breaks 0     MPH 2.9     METS 3.17     RPE 13     VO2 Peak 12.97     Symptoms No     Resting HR 65 bpm     Resting BP 128/84     Max Ex. HR 108 bpm     Max Ex. BP 150/90     2 Minute Post BP 134/80        Initial Exercise Prescription:     Initial Exercise Prescription - 09/19/16 1600      Date of Initial Exercise RX and Referring Provider   Date 09/19/16   Referring Provider Shelva Majestic MD     NuStep   Level 2   Minutes 10   METs 2     Arm Ergometer   Level 1   Minutes 10   METs 2     Track   Laps 10   Minutes 10   METs 2.74     Prescription Details   Frequency (times per week) 3   Duration Progress to 30 minutes of continuous aerobic without signs/symptoms of physical distress     Intensity   THRR 40-80% of Max Heartrate 63-126   Ratings of Perceived Exertion 11-13     Progression   Progression Continue progressive overload as per policy without signs/symptoms or physical distress.     Resistance Training   Training Prescription Yes   Weight 2lbs   Reps 10-12      Perform Capillary Blood Glucose checks as needed.  Exercise Prescription Changes:     Exercise Prescription Changes    Row Name 10/05/16 1200 10/19/16 1400           Response to Exercise   Blood Pressure (Admit) 150/90 140/90      Blood Pressure (Exercise) 166/94 160/90      Blood Pressure (Exit) 140/92 140/84      Heart Rate (Admit) 85 bpm 86 bpm      Heart Rate (Exercise) 99 bpm 110 bpm      Heart Rate (Exit) 58 bpm 70 bpm      Rating of Perceived Exertion (Exercise) 11 11      Duration Progress to 45 minutes of aerobic exercise without signs/symptoms of physical distress Progress to 45 minutes of aerobic exercise without signs/symptoms of physical distress      Intensity THRR unchanged THRR unchanged         Progression   Progression Continue to progress workloads to maintain intensity without signs/symptoms of physical distress. Continue to progress workloads to maintain intensity without  signs/symptoms of physical distress.      Average METs 2.25 2.6        Resistance Training   Training Prescription Yes Yes      Weight 4lbs 6lbs      Reps 10-12 10-12        NuStep   Level 2 3      Minutes 10 10      METs 2.1 2.5        Arm Ergometer   Level 1 2      Minutes 10 10      METs 2.23 2.5        Track   Laps 10 10      Minutes 10 10      METs 2.74 2.76        Home Exercise Plan   Plans to continue exercise at  - Home      Frequency  - Add 2 additional days to program exercise sessions.         Exercise Comments:     Exercise Comments    Row Name 10/05/16 1207 10/25/16 0809         Exercise Comments No exercise changes at this time, will continue to monitor.  Pt is off to a great start with exercise.  Reviewed METs and goals with pt.  Pt is doing well with exercise          Discharge Exercise Prescription (Final Exercise Prescription Changes):     Exercise Prescription Changes - 10/19/16 1400      Response to Exercise   Blood Pressure (Admit) 140/90   Blood Pressure (Exercise) 160/90   Blood Pressure (Exit) 140/84   Heart Rate (Admit) 86 bpm   Heart Rate (Exercise) 110 bpm   Heart Rate (Exit) 70 bpm   Rating of Perceived Exertion (Exercise) 11   Duration Progress to 45 minutes of aerobic exercise without signs/symptoms of physical distress   Intensity THRR unchanged     Progression   Progression Continue to progress workloads to maintain intensity without signs/symptoms of physical distress.   Average METs 2.6     Resistance Training   Training Prescription Yes   Weight 6lbs   Reps 10-12     NuStep   Level 3   Minutes 10   METs 2.5     Arm Ergometer   Level 2   Minutes 10   METs 2.5     Track   Laps 10   Minutes 10   METs 2.76     Home  Exercise Plan   Plans to continue exercise at Home   Frequency Add 2 additional days to program exercise sessions.      Nutrition:  Target Goals: Understanding of nutrition guidelines, daily intake of sodium <1557m, cholesterol <2038m calories 30% from fat and 7% or less from saturated fats, daily to have 5 or more servings of fruits and vegetables.  Biometrics:      Post Biometrics - 09/19/16 1636       Post  Biometrics   Waist Circumference 42.5 inches   Hip Circumference 45 inches   Waist to Hip Ratio 0.94 %   Triceps Skinfold 19 mm   % Body Fat 30.6 %   Grip Strength 46 kg   Flexibility 14 in   Single Leg Stand 2.75 seconds      Nutrition Therapy Plan and Nutrition Goals:     Nutrition Therapy & Goals - 10/04/16 1349  Nutrition Therapy   Diet Therapeutic Lifestyle Changes     Personal Nutrition Goals   Personal Goal #1 1-2 lb wt loss/week to a wt loss goal of 6-24 lb at graduation from [place], educate and counsel regarding individualized specific dietary modifications aiming towards targeted core components such as weight, hypertension, lipid management, diabetes, heart failure and other comorbidities.   Expected Outcomes Short Term Goal: Understand basic principles of dietary content, such as calories, fat, sodium, cholesterol and nutrients.;Long Term Goal: Adherence to prescribed nutrition plan.      Nutrition Discharge: Nutrition Scores:     Nutrition Assessments - 10/04/16 1349      MEDFICTS Scores   Pre Score 33      Nutrition Goals Re-Evaluation:   Psychosocial: Target Goals: Acknowledge presence or absence of depression, maximize coping skills, provide positive support system. Participant is able to verbalize types and ability to use techniques and skills needed for reducing stress and depression.  Initial Review & Psychosocial Screening:     Initial Psych Review & Screening -  10/05/16 [place]? Yes      Quality of Life Scores:     Quality of Life - 09/19/16 1642      Quality of Life Scores   Health/Function Pre 24.8 %   Socioeconomic Pre 24.56 %   Psych/Spiritual Pre 24 %   Family Pre 30 %   GLOBAL Pre 25.33 %      PHQ-9: Recent Review Flowsheet Data    Depression screen PHQ 2/9 09/25/2016   Decreased Interest 1   Down, Depressed, Hopeless 1   PHQ - 2 Score 2      Psychosocial Evaluation and Intervention:     Psychosocial Evaluation - 11/02/16 1329      Psychosocial Evaluation & Interventions   Interventions Stress management education;Encouraged to exercise with the program and follow exercise prescription;Relaxation education   Comments Pt demonstrates positive and healthy outlook.  Supportive family.   Continued Psychosocial Services Needed No      Psychosocial Re-Evaluation:     Psychosocial Re-Evaluation    [place] Name 10/05/16 1346 11/02/16 1330           Psychosocial Re-Evaluation   Interventions Encouraged to attend Cardiac Rehabilitation for the exercise;Stress management education;Relaxation education Encouraged to attend Pulmonary Rehabilitation for the exercise      Comments  - denies any needs or interventions.         Vocational Rehabilitation: Provide vocational rehab assistance to qualifying candidates.   Vocational Rehab Evaluation & Intervention:     Vocational Rehab - 09/19/16 1702      Initial Vocational Rehab Evaluation & Intervention   Assessment shows need for Vocational Rehabilitation No      Education: Education Goals: Education classes will be provided on a weekly basis, covering required topics. Participant will state understanding/return demonstration of topics presented.  Learning Barriers/Preferences:     Learning Barriers/Preferences - 09/19/16 1450      Learning Barriers/Preferences   Learning Barriers Sight   Learning Preferences Skilled  Demonstration;Pictoral;Video      Education Topics: Count Your Pulse:  -Group instruction provided by verbal instruction, demonstration, patient participation and written materials to support subject.  Instructors address importance of being able to find your pulse and how to count your pulse when at home without a heart monitor.  Patients get hands on experience  counting their pulse with staff help and individually.   Heart Attack, Angina, and Risk Factor Modification:  -Group instruction provided by verbal instruction, video, and written materials to support subject.  Instructors address signs and symptoms of angina and heart attacks.    Also discuss risk factors for heart disease and how to make changes to improve heart health risk factors.   Functional Fitness:  -Group instruction provided by verbal instruction, demonstration, patient participation, and written materials to support subject.  Instructors address safety measures for doing things around the house.  Discuss how to get up and down off the floor, how to pick things up properly, how to safely get out of a chair without assistance, and balance training.   Meditation and Mindfulness:  -Group instruction provided by verbal instruction, patient participation, and written materials to support subject.  Instructor addresses importance of mindfulness and meditation practice to help reduce stress and improve awareness.  Instructor also leads participants through a meditation exercise.    Stretching for Flexibility and Mobility:  -Group instruction provided by verbal instruction, patient participation, and written materials to support subject.  Instructors lead participants through series of stretches that are designed to increase flexibility thus improving mobility.  These stretches are additional exercise for major muscle groups that are typically performed during regular warm up and cool down.   Hands Only CPR Anytime:  -Group  instruction provided by verbal instruction, video, patient participation and written materials to support subject.  Instructors co-teach with AHA video for hands only CPR.  Participants get hands on experience with mannequins.   Nutrition I class: Heart Healthy Eating:  -Group instruction provided by PowerPoint slides, verbal discussion, and written materials to support subject matter. The instructor gives an explanation and review of the Therapeutic Lifestyle Changes diet recommendations, which includes a discussion on lipid goals, dietary fat, sodium, fiber, plant stanol/sterol esters, sugar, and the components of a well-balanced, healthy diet.   Nutrition II class: Lifestyle Skills:  -Group instruction provided by PowerPoint slides, verbal discussion, and written materials to support subject matter. The instructor gives an explanation and review of label reading, grocery shopping for heart health, heart healthy recipe modifications, and ways to make healthier choices when eating out.   Diabetes Question & Answer:  -Group instruction provided by PowerPoint slides, verbal discussion, and written materials to support subject matter. The instructor gives an explanation and review of diabetes co-morbidities, pre- and post-prandial blood glucose goals, pre-exercise blood glucose goals, signs, symptoms, and treatment of hypoglycemia and hyperglycemia, and foot care basics.   Diabetes Blitz:  -Group instruction provided by PowerPoint slides, verbal discussion, and written materials to support subject matter. The instructor gives an explanation and review of the physiology behind type 1 and type 2 diabetes, diabetes medications and rational behind using different medications, pre- and post-prandial blood glucose recommendations and Hemoglobin A1c goals, diabetes diet, and exercise including blood glucose guidelines for exercising safely.    Portion Distortion:  -Group instruction provided by PowerPoint  slides, verbal discussion, written materials, and food models to support subject matter. The instructor gives an explanation of serving size versus portion size, changes in portions sizes over the last 20 years, and what consists of a serving from each food group.   Stress Management:  -Group instruction provided by verbal instruction, video, and written materials to support subject matter.  Instructors review role of stress in heart disease and how to cope with stress positively.     Exercising on Your Own:  -  Group instruction provided by verbal instruction, power point, and written materials to support subject.  Instructors discuss benefits of exercise, components of exercise, frequency and intensity of exercise, and end points for exercise.  Also discuss use of nitroglycerin and activating EMS.  Review options of places to exercise outside of rehab.  Review guidelines for sex with heart disease.   Cardiac Drugs I:  -Group instruction provided by verbal instruction and written materials to support subject.  Instructor reviews cardiac drug classes: antiplatelets, anticoagulants, beta blockers, and statins.  Instructor discusses reasons, side effects, and lifestyle considerations for each drug class.   Cardiac Drugs II:  -Group instruction provided by verbal instruction and written materials to support subject.  Instructor reviews cardiac drug classes: angiotensin converting enzyme inhibitors (ACE-I), angiotensin II receptor blockers (ARBs), nitrates, and calcium channel blockers.  Instructor discusses reasons, side effects, and lifestyle considerations for each drug class.   Anatomy and Physiology of the Circulatory System:  -Group instruction provided by verbal instruction, video, and written materials to support subject.  Reviews functional anatomy of heart, how it relates to various diagnoses, and what role the heart plays in the overall system.   Knowledge Questionnaire Score:      Knowledge Questionnaire Score - 09/19/16 1631      Knowledge Questionnaire Score   Pre Score 24/24      Core Components/Risk Factors/Patient Goals at Admission:     Personal Goals and Risk Factors at Admission - 09/19/16 1451      Core Components/Risk Factors/Patient Goals on Admission    Weight Management Yes;Weight Loss   Intervention Weight Management: Develop a combined nutrition and exercise program designed to reach desired caloric intake, while maintaining appropriate intake of nutrient and fiber, sodium and fats, and appropriate energy expenditure required for the weight goal.;Obesity: Provide education and appropriate resources to help participant work on and attain dietary goals.   Expected Outcomes Weight Maintenance: Understanding of the daily nutrition guidelines, which includes 25-35% calories from fat, 7% or less cal from saturated fats, less than 263m cholesterol, less than 1.5gm of sodium, & 5 or more servings of fruits and vegetables daily;Long Term: Adherence to nutrition and physical activity/exercise program aimed toward attainment of established weight goal;Short Term: Continue to assess and modify interventions until short term weight is achieved;Weight Loss: Understanding of general recommendations for a balanced deficit meal plan, which promotes 1-2 lb weight loss per week and includes a negative energy balance of [phone] kcal/d;Understanding recommendations for meals to include 15-35% energy as protein, 25-35% energy from fat, 35-60% energy from carbohydrates, less than 2059mof dietary cholesterol, 20-35 gm of total fiber daily;Understanding of distribution of calorie intake throughout the day with the consumption of 4-5 meals/snacks;Weight Gain: Understanding of general recommendations for a high calorie, high protein meal plan that promotes weight gain by distributing calorie intake throughout the day with the consumption for 4-5 meals, snacks, and/or supplements    Sedentary Yes   Intervention Provide advice, education, support and counseling about physical activity/exercise needs.;Develop an individualized exercise prescription for aerobic and resistive training based on initial evaluation findings, risk stratification, comorbidities and participant's personal goals.   Expected Outcomes Achievement of increased cardiorespiratory fitness and enhanced flexibility, muscular endurance and strength shown through measurements of functional capacity and personal statement of participant.   Increase Strength and Stamina Yes   Intervention Provide advice, education, support and counseling about physical activity/exercise needs.;Develop an individualized exercise prescription for aerobic and resistive training based on initial evaluation findings,  risk stratification, comorbidities and participant's personal goals.   Expected Outcomes Achievement of increased cardiorespiratory fitness and enhanced flexibility, muscular endurance and strength shown through measurements of functional capacity and personal statement of participant.   Improve shortness of breath with ADL's Yes   Intervention Provide education, individualized exercise plan and daily activity instruction to help decrease symptoms of SOB with activities of daily living.   Expected Outcomes Short Term: Achieves a reduction of symptoms when performing activities of daily living.   Hypertension Yes   Intervention Provide education on lifestyle modifcations including regular physical activity/exercise, weight management, moderate sodium restriction and increased consumption of fresh fruit, vegetables, and low fat dairy, alcohol moderation, and smoking cessation.;Monitor prescription use compliance.   Expected Outcomes Long Term: Maintenance of blood pressure at goal levels.;Short Term: Continued assessment and intervention until BP is < 140/47m HG in hypertensive participants. < 130/844mHG in hypertensive  participants with diabetes, heart failure or chronic kidney disease.   Lipids Yes   Intervention Provide education and support for participant on nutrition & aerobic/resistive exercise along with prescribed medications to achieve LDL <7011mHDL >18m67m Expected Outcomes Short Term: Participant states understanding of desired cholesterol values and is compliant with medications prescribed. Participant is following exercise prescription and nutrition guidelines.;Long Term: Cholesterol controlled with medications as prescribed, with individualized exercise RX and with personalized nutrition plan. Value goals: LDL < 70mg36mL > 40 mg.   Personal Goal short: lose~10lbs and increase strength and stamina. Long: goal wt '@220lbs'    Intervention Provide nutrition and exercise guidelines to assist with weightloss and improving cardiovascular fitness   Expected Outcomes Pt will be able to lose wt and increase strength and stamina      Core Components/Risk Factors/Patient Goals Review:      Goals and Risk Factor Review    Row Name 10/25/16 0810             Core Components/Risk Factors/Patient Goals Review   Personal Goals Review Increase Strength and Stamina       Review Pt feels like he's getting stronger and can do more with less fatigue.  He has started walking at home 2xs/wk and is doing well with workload increases       Expected Outcomes Increase strength and stamina and overall fitness level           Core Components/Risk Factors/Patient Goals at Discharge (Final Review):      Goals and Risk Factor Review - 10/25/16 0810      Core Components/Risk Factors/Patient Goals Review   Personal Goals Review Increase Strength and Stamina   Review Pt feels like he's getting stronger and can do more with less fatigue.  He has started walking at home 2xs/wk and is doing well with workload increases   Expected Outcomes Increase strength and stamina and overall fitness level       ITP Comments:      ITP Comments    Row Name 09/19/16 1341 10/27/16 1228         ITP Comments Medical Director- Dr. TraciFransico Him Attended CPR education class.  outcomes and goals met          Comments:  Pt is making expected progress toward personal goals after completing 14 sessions.Repeat Psychosocial Assessment reveals no changes from previous assessment. Pt interacting positively with fellow participants.  Pt feels he is making progress toward meeting his goals.  Bp remain elevated for pre but improves with exercise. Recommend continued exercise and  life style modification education including  stress management and relaxation techniques to decrease cardiac risk profile. Cherre Huger, BSN

## 2016-11-03 ENCOUNTER — Encounter (HOSPITAL_COMMUNITY)
Admission: RE | Admit: 2016-11-03 | Discharge: 2016-11-03 | Disposition: A | Discharge status: Home / Self Care | Payer: BLUE CROSS/BLUE SHIELD | Source: Ambulatory Visit | Attending: Cardiology | Admitting: Cardiology

## 2016-11-03 ENCOUNTER — Encounter (HOSPITAL_COMMUNITY): Payer: BLUE CROSS/BLUE SHIELD

## 2016-11-03 DIAGNOSIS — Z955 Presence of coronary angioplasty implant and graft: Secondary | ICD-10-CM

## 2016-11-03 DIAGNOSIS — I214 Non-ST elevation (NSTEMI) myocardial infarction: Secondary | ICD-10-CM | POA: Diagnosis not present

## 2016-11-06 ENCOUNTER — Encounter (HOSPITAL_COMMUNITY): Payer: BLUE CROSS/BLUE SHIELD

## 2016-11-06 ENCOUNTER — Telehealth (INDEPENDENT_AMBULATORY_CARE_PROVIDER_SITE_OTHER): Payer: Self-pay | Admitting: Orthopedic Surgery

## 2016-11-06 ENCOUNTER — Encounter (HOSPITAL_COMMUNITY)
Admission: RE | Admit: 2016-11-06 | Discharge: 2016-11-06 | Disposition: A | Discharge status: Home / Self Care | Payer: BLUE CROSS/BLUE SHIELD | Source: Ambulatory Visit | Attending: Cardiology | Admitting: Cardiology

## 2016-11-06 DIAGNOSIS — Z955 Presence of coronary angioplasty implant and graft: Secondary | ICD-10-CM

## 2016-11-06 DIAGNOSIS — I214 Non-ST elevation (NSTEMI) myocardial infarction: Secondary | ICD-10-CM

## 2016-11-06 NOTE — Telephone Encounter (Signed)
Ok to refill ibuprofen 800mg ?

## 2016-11-06 NOTE — Telephone Encounter (Signed)
Ok per Dr Marlou Sa to send Duexis in to pharm, call pt to discuss.

## 2016-11-06 NOTE — Telephone Encounter (Signed)
Ibuprofen 800 mg

## 2016-11-07 MED ORDER — IBUPROFEN-FAMOTIDINE 800-26.6 MG PO TABS: 1.0000 | ORAL_TABLET | Freq: Two times a day (BID) | ORAL | 2 refills | Status: DC

## 2016-11-07 MED ORDER — IBUPROFEN-FAMOTIDINE 800-26.6 MG PO TABS: 1.0000 | ORAL_TABLET | Freq: Three times a day (TID) | ORAL | 2 refills | Status: DC

## 2016-11-07 NOTE — Telephone Encounter (Signed)
IC pt and advised,  Rx sent to pharmacy

## 2016-11-08 ENCOUNTER — Encounter (HOSPITAL_COMMUNITY)
Admission: RE | Admit: 2016-11-08 | Discharge: 2016-11-08 | Disposition: A | Discharge status: Home / Self Care | Payer: BLUE CROSS/BLUE SHIELD | Source: Ambulatory Visit | Attending: Cardiology | Admitting: Cardiology

## 2016-11-08 ENCOUNTER — Encounter (HOSPITAL_COMMUNITY): Payer: BLUE CROSS/BLUE SHIELD

## 2016-11-08 DIAGNOSIS — Z955 Presence of coronary angioplasty implant and graft: Secondary | ICD-10-CM

## 2016-11-08 DIAGNOSIS — I214 Non-ST elevation (NSTEMI) myocardial infarction: Secondary | ICD-10-CM | POA: Diagnosis not present

## 2016-11-10 ENCOUNTER — Encounter (HOSPITAL_COMMUNITY): Payer: BLUE CROSS/BLUE SHIELD

## 2016-11-13 ENCOUNTER — Encounter (HOSPITAL_COMMUNITY): Payer: BLUE CROSS/BLUE SHIELD

## 2016-11-15 ENCOUNTER — Encounter (HOSPITAL_COMMUNITY): Payer: BLUE CROSS/BLUE SHIELD

## 2016-11-17 ENCOUNTER — Encounter (HOSPITAL_COMMUNITY): Payer: BLUE CROSS/BLUE SHIELD

## 2016-11-20 ENCOUNTER — Encounter (HOSPITAL_COMMUNITY): Payer: BLUE CROSS/BLUE SHIELD

## 2016-11-20 ENCOUNTER — Encounter (HOSPITAL_COMMUNITY)
Admission: RE | Admit: 2016-11-20 | Discharge: 2016-11-20 | Disposition: A | Discharge status: Home / Self Care | Payer: BLUE CROSS/BLUE SHIELD | Source: Ambulatory Visit | Attending: Cardiology | Admitting: Cardiology

## 2016-11-20 DIAGNOSIS — Z955 Presence of coronary angioplasty implant and graft: Secondary | ICD-10-CM

## 2016-11-20 DIAGNOSIS — I214 Non-ST elevation (NSTEMI) myocardial infarction: Secondary | ICD-10-CM | POA: Diagnosis not present

## 2016-11-22 ENCOUNTER — Encounter (HOSPITAL_COMMUNITY): Payer: BLUE CROSS/BLUE SHIELD

## 2016-11-22 ENCOUNTER — Encounter (HOSPITAL_COMMUNITY)
Admission: RE | Admit: 2016-11-22 | Discharge: 2016-11-22 | Disposition: A | Discharge status: Home / Self Care | Payer: BLUE CROSS/BLUE SHIELD | Source: Ambulatory Visit | Attending: Cardiology | Admitting: Cardiology

## 2016-11-22 DIAGNOSIS — Z955 Presence of coronary angioplasty implant and graft: Secondary | ICD-10-CM

## 2016-11-22 DIAGNOSIS — I214 Non-ST elevation (NSTEMI) myocardial infarction: Secondary | ICD-10-CM

## 2016-11-24 ENCOUNTER — Encounter (HOSPITAL_COMMUNITY): Payer: BLUE CROSS/BLUE SHIELD

## 2016-11-27 ENCOUNTER — Encounter (HOSPITAL_COMMUNITY)
Admission: RE | Admit: 2016-11-27 | Discharge: 2016-11-27 | Disposition: A | Discharge status: Home / Self Care | Payer: BLUE CROSS/BLUE SHIELD | Source: Ambulatory Visit | Attending: Cardiology | Admitting: Cardiology

## 2016-11-27 ENCOUNTER — Encounter (HOSPITAL_COMMUNITY): Payer: BLUE CROSS/BLUE SHIELD

## 2016-11-27 DIAGNOSIS — Z955 Presence of coronary angioplasty implant and graft: Secondary | ICD-10-CM

## 2016-11-27 DIAGNOSIS — I214 Non-ST elevation (NSTEMI) myocardial infarction: Secondary | ICD-10-CM

## 2016-11-29 ENCOUNTER — Encounter (HOSPITAL_COMMUNITY): Payer: BLUE CROSS/BLUE SHIELD

## 2016-11-30 NOTE — Progress Notes (Signed)
Cardiac Individual Treatment Plan  Patient Details  Name: Jacob Quinn MRN: [id number] Date of Birth: 06/26/1955 Referring Provider:   Flowsheet Row CARDIAC REHAB PHASE II ORIENTATION from 09/19/2016 in [facility]  Referring Provider  Shelva Majestic MD      Initial Encounter Date:  [place] PHASE II ORIENTATION from 09/19/2016 in [facility]  Date  09/19/16  Referring Provider  Shelva Majestic MD      Visit Diagnosis: 08/07/16 NSTEMI (non-ST elevated myocardial infarction) ([place])  08/07/16 Status post insertion of drug eluting coronary artery stent  Patient's Home Medications on Admission:  Current Outpatient Prescriptions:  .  amLODipine (NORVASC) 10 MG tablet, Take 1 tablet (10 mg total) by mouth daily., Disp: 30 tablet, Rfl: 6 .  aspirin EC 81 MG EC tablet, Take 1 tablet (81 mg total) by mouth daily., Disp: 30 tablet, Rfl: 11 .  atorvastatin (LIPITOR) 80 MG tablet, Take 1 tablet (80 mg total) by mouth every evening., Disp: 30 tablet, Rfl: 6 .  ibuprofen (ADVIL,MOTRIN) 800 MG tablet, Take 1 tablet (800 mg total) by mouth every 8 (eight) hours as needed., Disp: 30 tablet, Rfl: 0 .  Ibuprofen-Famotidine (DUEXIS) 800-26.6 MG TABS, Take 1 tablet by mouth 3 (three) times daily., Disp: 90 tablet, Rfl: 2 .  lisinopril (PRINIVIL,ZESTRIL) 20 MG tablet, Take 1 tablet (20 mg total) by mouth daily., Disp: 30 tablet, Rfl: 0 .  Multiple Vitamin (MULTI VITAMIN DAILY) TABS, Take 1 tablet by mouth daily., Disp: , Rfl:  .  nitroGLYCERIN (NITROSTAT) 0.4 MG SL tablet, Place 1 tablet (0.4 mg total) under the tongue every 5 (five) minutes as needed for chest pain. Up to 3 doses., Disp: 25 tablet, Rfl: 3 .  ticagrelor (BRILINTA) 90 MG TABS tablet, Take 1 tablet (90 mg total) by mouth every 12 (twelve) hours., Disp: 180 tablet, Rfl: 3  Past Medical History: Past Medical History:  Diagnosis Date  . BPH associated with  nocturia   . CAD in native artery    a. NSTEMI 07/2016 -  LHC 08/07/16 showing 70% D1, 20% mLAD, segmental 95% then 75% OM stenosis, 85% acute marg, normal LVEDP - received PTCA/DES to OM.  [place] Kitchen Encephalitis, viral   . GERD (gastroesophageal reflux disease)   . Hyperlipidemia 08/07/2016  . Hypertension   . Ischemic cardiomyopathy    a. LHC 07/2016 - mild mid anterolateral focal hypocontractility felt to be due to the circumflex stenosis, EF 50%.  . OA (osteoarthritis)   . Obesity (BMI 30-39.9) 08/07/2016  . Renal insufficiency    a. 2016 r/t dehydration/ACEI use.  Mar[place] Kitchen Restless leg syndrome 09/16/2010  . Rheumatic fever   . Syncope     Tobacco Use: History  Smoking Status  . Never Smoker  Smokeless Tobacco  . Never Used    Labs: Recent Review Flowsheet Data    Labs for ITP Cardiac and Pulmonary Rehab Latest Ref Rng & Units 01/12/2014 03/17/2014 01/30/2015 05/13/2015 08/07/2016   Cholestrol 0 - 200 mg/dL 200 208(H) - 200 216(H)   LDLCALC 0 - 99 mg/dL 120(H) 146(H) - 133(H) 156(H)   LDLDIRECT mg/dL - - - - -   HDL >40 mg/dL 39.00(L) 45.20 - 39.20 41   Trlycerides <150 mg/dL 207.0(H) 86.0 - 139.0 96   Hemoglobin A1c 4.8 - 5.6 % - - 5.6 - -      Capillary Blood Glucose: No results found for: GLUCAP   Exercise Target Goals:  Exercise Program Goal: Individual exercise prescription set with THRR, safety & activity barriers. Participant demonstrates ability to understand and report RPE using BORG scale, to self-measure pulse accurately, and to acknowledge the importance of the exercise prescription.  Exercise Prescription Goal: Starting with aerobic activity 30 plus minutes a day, 3 days per week for initial exercise prescription. Provide home exercise prescription and guidelines that participant acknowledges understanding prior to discharge.  Activity Barriers & Risk Stratification:     Activity Barriers & Cardiac Risk Stratification - 09/19/16 1449      Activity Barriers &  Cardiac Risk Stratification   Activity Barriers Joint Problems;Deconditioning;Muscular Weakness;Right Knee Replacement;Left Knee Replacement  partial L knee replacement, R TKR x2   Cardiac Risk Stratification Moderate      6 Minute Walk:     6 Minute Walk    Row Name 09/19/16 1632         6 Minute Walk   Phase Initial     Distance 1534 feet     Walk Time 6 minutes     # of Rest Breaks 0     MPH 2.9     METS 3.17     RPE 13     VO2 Peak 12.97     Symptoms No     Resting HR 65 bpm     Resting BP 128/84     Max Ex. HR 108 bpm     Max Ex. BP 150/90     2 Minute Post BP 134/80        Initial Exercise Prescription:     Initial Exercise Prescription - 09/19/16 1600      Date of Initial Exercise RX and Referring Provider   Date 09/19/16   Referring Provider Shelva Majestic MD     NuStep   Level 2   Minutes 10   METs 2     Arm Ergometer   Level 1   Minutes 10   METs 2     Track   Laps 10   Minutes 10   METs 2.74     Prescription Details   Frequency (times per week) 3   Duration Progress to 30 minutes of continuous aerobic without signs/symptoms of physical distress     Intensity   THRR 40-80% of Max Heartrate 63-126   Ratings of Perceived Exertion 11-13     Progression   Progression Continue progressive overload as per policy without signs/symptoms or physical distress.     Resistance Training   Training Prescription Yes   Weight 2lbs   Reps 10-12      Perform Capillary Blood Glucose checks as needed.  Exercise Prescription Changes:     Exercise Prescription Changes    Row Name 10/05/16 1200 10/19/16 1400 11/28/16 1400         Response to Exercise   Blood Pressure (Admit) 150/90 140/90 146/100     Blood Pressure (Exercise) 166/94 160/90 142/80     Blood Pressure (Exit) 140/92 140/84 120/81     Heart Rate (Admit) 85 bpm 86 bpm 92 bpm     Heart Rate (Exercise) 99 bpm 110 bpm 109 bpm     Heart Rate (Exit) 58 bpm 70 bpm 64 bpm     Rating of  Perceived Exertion (Exercise) '11 11 12     ' Duration Progress to 45 minutes of aerobic exercise without signs/symptoms of physical distress Progress to 45 minutes of aerobic exercise without signs/symptoms of physical distress Progress to 30 minutes  of continuous aerobic without signs/symptoms of physical distress     Intensity THRR unchanged THRR unchanged THRR unchanged       Progression   Progression Continue to progress workloads to maintain intensity without signs/symptoms of physical distress. Continue to progress workloads to maintain intensity without signs/symptoms of physical distress. Continue to progress workloads to maintain intensity without signs/symptoms of physical distress.     Average METs 2.25 2.6 2.3       Resistance Training   Training Prescription Yes Yes Yes     Weight 4lbs 6lbs 5lbs     Reps 10-12 10-12 10-12       NuStep   Level '2 3 6     ' Minutes '10 10 10     ' METs 2.1 2.5 2.3       Arm Ergometer   Level '1 2 2     ' Minutes '10 10 10     ' METs 2.23 2.5 2.5       Track   Laps '10 10 7     ' Minutes '10 10 10     ' METs 2.74 2.76 2.23       Home Exercise Plan   Plans to continue exercise at  - Home Home     Frequency  - Add 2 additional days to program exercise sessions. Add 2 additional days to program exercise sessions.        Exercise Comments:     Exercise Comments    Row Name 10/05/16 1207 10/25/16 0809 11/28/16 1459       Exercise Comments No exercise changes at this time, will continue to monitor.  Pt is off to a great start with exercise.  Reviewed METs and goals with pt.  Pt is doing well with exercise  Reviewed METs and goals. Pt is tolerating exercise well; will continue to monitor exercise progressions.        Discharge Exercise Prescription (Final Exercise Prescription Changes):     Exercise Prescription Changes - 11/28/16 1400      Response to Exercise   Blood Pressure (Admit) 146/100   Blood Pressure (Exercise) 142/80   Blood Pressure  (Exit) 120/81   Heart Rate (Admit) 92 bpm   Heart Rate (Exercise) 109 bpm   Heart Rate (Exit) 64 bpm   Rating of Perceived Exertion (Exercise) 12   Duration Progress to 30 minutes of continuous aerobic without signs/symptoms of physical distress   Intensity THRR unchanged     Progression   Progression Continue to progress workloads to maintain intensity without signs/symptoms of physical distress.   Average METs 2.3     Resistance Training   Training Prescription Yes   Weight 5lbs   Reps 10-12     NuStep   Level 6   Minutes 10   METs 2.3     Arm Ergometer   Level 2   Minutes 10   METs 2.5     Track   Laps 7   Minutes 10   METs 2.23     Home Exercise Plan   Plans to continue exercise at Home   Frequency Add 2 additional days to program exercise sessions.      Nutrition:  Target Goals: Understanding of nutrition guidelines, daily intake of sodium <1557m, cholesterol <2026m calories 30% from fat and 7% or less from saturated fats, daily to have 5 or more servings of fruits and vegetables.  Biometrics:      Post Biometrics - 09/19/16 1636  Post  Biometrics   Waist Circumference 42.5 inches   Hip Circumference 45 inches   Waist to Hip Ratio 0.94 %   Triceps Skinfold 19 mm   % Body Fat 30.6 %   Grip Strength 46 kg   Flexibility 14 in   Single Leg Stand 2.75 seconds      Nutrition Therapy Plan and Nutrition Goals:     Nutrition Therapy & Goals - 10/04/16 1349      Nutrition Therapy   Diet Therapeutic Lifestyle Changes     Personal Nutrition Goals   Personal Goal #1 1-2 lb wt loss/week to a wt loss goal of 6-24 lb at graduation from [facility], educate and counsel regarding individualized specific dietary modifications aiming towards targeted core components such as weight, hypertension, lipid management, diabetes, heart failure and other comorbidities.   Expected Outcomes Short Term Goal:  Understand basic principles of dietary content, such as calories, fat, sodium, cholesterol and nutrients.;Long Term Goal: Adherence to prescribed nutrition plan.      Nutrition Discharge: Nutrition Scores:     Nutrition Assessments - 10/04/16 1349      MEDFICTS Scores   Pre Score 33      Nutrition Goals Re-Evaluation:   Psychosocial: Target Goals: Acknowledge presence or absence of depression, maximize coping skills, provide positive support system. Participant is able to verbalize types and ability to use techniques and skills needed for reducing stress and depression.  Initial Review & Psychosocial Screening:     Initial Psych Review & Screening - 10/05/16 [place]? Yes      Quality of Life Scores:     Quality of Life - 09/19/16 1642      Quality of Life Scores   Health/Function Pre 24.8 %   Socioeconomic Pre 24.56 %   Psych/Spiritual Pre 24 %   Family Pre 30 %   GLOBAL Pre 25.33 %      PHQ-9: Recent Review Flowsheet Data    Depression screen PHQ 2/9 09/25/2016   Decreased Interest 1   Down, Depressed, Hopeless 1   PHQ - 2 Score 2      Psychosocial Evaluation and Intervention:     Psychosocial Evaluation - 11/30/16 1556      Psychosocial Evaluation & Interventions   Interventions Stress management education;Encouraged to exercise with the program and follow exercise prescription;Relaxation education   Comments Pt demonstrates positive and healthy outlook.  Supportive family.   Continued Psychosocial Services Needed No      Psychosocial Re-Evaluation:     Psychosocial Re-Evaluation    [place] Name 10/05/16 1346 11/02/16 1330 11/30/16 1556         Psychosocial Re-Evaluation   Interventions Encouraged to attend Cardiac Rehabilitation for the exercise;Stress management education;Relaxation education Encouraged to attend Pulmonary Rehabilitation for the exercise Encouraged to attend Cardiac Rehabilitation for  the exercise;Relaxation education;Stress management education     Comments  - denies any needs or interventions. denies any needs or interventions.        Vocational Rehabilitation: Provide vocational rehab assistance to qualifying candidates.   Vocational Rehab Evaluation & Intervention:     Vocational Rehab - 09/19/16 1702      Initial Vocational Rehab Evaluation & Intervention   Assessment shows need for Vocational Rehabilitation No      Education: Education Goals: Education classes will be provided on a weekly basis, covering required  topics. Participant will state understanding/return demonstration of topics presented.  Learning Barriers/Preferences:     Learning Barriers/Preferences - 09/19/16 1450      Learning Barriers/Preferences   Learning Barriers Sight   Learning Preferences Skilled Demonstration;Pictoral;Video      Education Topics: Count Your Pulse:  -Group instruction provided by verbal instruction, demonstration, patient participation and written materials to support subject.  Instructors address importance of being able to find your pulse and how to count your pulse when at home without a heart monitor.  Patients get hands on experience counting their pulse with staff help and individually.   Heart Attack, Angina, and Risk Factor Modification:  -Group instruction provided by verbal instruction, video, and written materials to support subject.  Instructors address signs and symptoms of angina and heart attacks.    Also discuss risk factors for heart disease and how to make changes to improve heart health risk factors.   Functional Fitness:  -Group instruction provided by verbal instruction, demonstration, patient participation, and written materials to support subject.  Instructors address safety measures for doing things around the house.  Discuss how to get up and down off the floor, how to pick things up properly, how to safely get out of a chair without  assistance, and balance training.   Meditation and Mindfulness:  -Group instruction provided by verbal instruction, patient participation, and written materials to support subject.  Instructor addresses importance of mindfulness and meditation practice to help reduce stress and improve awareness.  Instructor also leads participants through a meditation exercise.    Stretching for Flexibility and Mobility:  -Group instruction provided by verbal instruction, patient participation, and written materials to support subject.  Instructors lead participants through series of stretches that are designed to increase flexibility thus improving mobility.  These stretches are additional exercise for major muscle groups that are typically performed during regular warm up and cool down.   Hands Only CPR Anytime:  -Group instruction provided by verbal instruction, video, patient participation and written materials to support subject.  Instructors co-teach with AHA video for hands only CPR.  Participants get hands on experience with mannequins.   Nutrition I class: Heart Healthy Eating:  -Group instruction provided by PowerPoint slides, verbal discussion, and written materials to support subject matter. The instructor gives an explanation and review of the Therapeutic Lifestyle Changes diet recommendations, which includes a discussion on lipid goals, dietary fat, sodium, fiber, plant stanol/sterol esters, sugar, and the components of a well-balanced, healthy diet.   Nutrition II class: Lifestyle Skills:  -Group instruction provided by PowerPoint slides, verbal discussion, and written materials to support subject matter. The instructor gives an explanation and review of label reading, grocery shopping for heart health, heart healthy recipe modifications, and ways to make healthier choices when eating out.   Diabetes Question & Answer:  -Group instruction provided by PowerPoint slides, verbal discussion, and  written materials to support subject matter. The instructor gives an explanation and review of diabetes co-morbidities, pre- and post-prandial blood glucose goals, pre-exercise blood glucose goals, signs, symptoms, and treatment of hypoglycemia and hyperglycemia, and foot care basics.   Diabetes Blitz:  -Group instruction provided by PowerPoint slides, verbal discussion, and written materials to support subject matter. The instructor gives an explanation and review of the physiology behind type 1 and type 2 diabetes, diabetes medications and rational behind using different medications, pre- and post-prandial blood glucose recommendations and Hemoglobin A1c goals, diabetes diet, and exercise including blood glucose guidelines for exercising safely.  Portion Distortion:  -Group instruction provided by PowerPoint slides, verbal discussion, written materials, and food models to support subject matter. The instructor gives an explanation of serving size versus portion size, changes in portions sizes over the last 20 years, and what consists of a serving from each food group.   Stress Management:  -Group instruction provided by verbal instruction, video, and written materials to support subject matter.  Instructors review role of stress in heart disease and how to cope with stress positively.     Exercising on Your Own:  -Group instruction provided by verbal instruction, power point, and written materials to support subject.  Instructors discuss benefits of exercise, components of exercise, frequency and intensity of exercise, and end points for exercise.  Also discuss use of nitroglycerin and activating EMS.  Review options of places to exercise outside of rehab.  Review guidelines for sex with heart disease.   Cardiac Drugs I:  -Group instruction provided by verbal instruction and written materials to support subject.  Instructor reviews cardiac drug classes: antiplatelets, anticoagulants, beta  blockers, and statins.  Instructor discusses reasons, side effects, and lifestyle considerations for each drug class.   Cardiac Drugs II:  -Group instruction provided by verbal instruction and written materials to support subject.  Instructor reviews cardiac drug classes: angiotensin converting enzyme inhibitors (ACE-I), angiotensin II receptor blockers (ARBs), nitrates, and calcium channel blockers.  Instructor discusses reasons, side effects, and lifestyle considerations for each drug class.   Anatomy and Physiology of the Circulatory System:  -Group instruction provided by verbal instruction, video, and written materials to support subject.  Reviews functional anatomy of heart, how it relates to various diagnoses, and what role the heart plays in the overall system.   Knowledge Questionnaire Score:     Knowledge Questionnaire Score - 09/19/16 1631      Knowledge Questionnaire Score   Pre Score 24/24      Core Components/Risk Factors/Patient Goals at Admission:     Personal Goals and Risk Factors at Admission - 09/19/16 1451      Core Components/Risk Factors/Patient Goals on Admission    Weight Management Yes;Weight Loss   Intervention Weight Management: Develop a combined nutrition and exercise program designed to reach desired caloric intake, while maintaining appropriate intake of nutrient and fiber, sodium and fats, and appropriate energy expenditure required for the weight goal.;Obesity: Provide education and appropriate resources to help participant work on and attain dietary goals.   Expected Outcomes Weight Maintenance: Understanding of the daily nutrition guidelines, which includes 25-35% calories from fat, 7% or less cal from saturated fats, less than 240m cholesterol, less than 1.5gm of sodium, & 5 or more servings of fruits and vegetables daily;Long Term: Adherence to nutrition and physical activity/exercise program aimed toward attainment of established weight goal;Short  Term: Continue to assess and modify interventions until short term weight is achieved;Weight Loss: Understanding of general recommendations for a balanced deficit meal plan, which promotes 1-2 lb weight loss per week and includes a negative energy balance of [phone] kcal/d;Understanding recommendations for meals to include 15-35% energy as protein, 25-35% energy from fat, 35-60% energy from carbohydrates, less than 2078mof dietary cholesterol, 20-35 gm of total fiber daily;Understanding of distribution of calorie intake throughout the day with the consumption of 4-5 meals/snacks;Weight Gain: Understanding of general recommendations for a high calorie, high protein meal plan that promotes weight gain by distributing calorie intake throughout the day with the consumption for 4-5 meals, snacks, and/or supplements   Sedentary Yes  Intervention Provide advice, education, support and counseling about physical activity/exercise needs.;Develop an individualized exercise prescription for aerobic and resistive training based on initial evaluation findings, risk stratification, comorbidities and participant's personal goals.   Expected Outcomes Achievement of increased cardiorespiratory fitness and enhanced flexibility, muscular endurance and strength shown through measurements of functional capacity and personal statement of participant.   Increase Strength and Stamina Yes   Intervention Provide advice, education, support and counseling about physical activity/exercise needs.;Develop an individualized exercise prescription for aerobic and resistive training based on initial evaluation findings, risk stratification, comorbidities and participant's personal goals.   Expected Outcomes Achievement of increased cardiorespiratory fitness and enhanced flexibility, muscular endurance and strength shown through measurements of functional capacity and personal statement of participant.   Improve shortness of breath with ADL's  Yes   Intervention Provide education, individualized exercise plan and daily activity instruction to help decrease symptoms of SOB with activities of daily living.   Expected Outcomes Short Term: Achieves a reduction of symptoms when performing activities of daily living.   Hypertension Yes   Intervention Provide education on lifestyle modifcations including regular physical activity/exercise, weight management, moderate sodium restriction and increased consumption of fresh fruit, vegetables, and low fat dairy, alcohol moderation, and smoking cessation.;Monitor prescription use compliance.   Expected Outcomes Long Term: Maintenance of blood pressure at goal levels.;Short Term: Continued assessment and intervention until BP is < 140/57m HG in hypertensive participants. < 130/864mHG in hypertensive participants with diabetes, heart failure or chronic kidney disease.   Lipids Yes   Intervention Provide education and support for participant on nutrition & aerobic/resistive exercise along with prescribed medications to achieve LDL <7070mHDL >32m33m Expected Outcomes Short Term: Participant states understanding of desired cholesterol values and is compliant with medications prescribed. Participant is following exercise prescription and nutrition guidelines.;Long Term: Cholesterol controlled with medications as prescribed, with individualized exercise RX and with personalized nutrition plan. Value goals: LDL < 70mg50mL > 40 mg.   Personal Goal short: lose~10lbs and increase strength and stamina. Long: goal wt '@220lbs'    Intervention Provide nutrition and exercise guidelines to assist with weightloss and improving cardiovascular fitness   Expected Outcomes Pt will be able to lose wt and increase strength and stamina      Core Components/Risk Factors/Patient Goals Review:      Goals and Risk Factor Review    Row Name 10/25/16 0810 11/28/16 1504           Core Components/Risk Factors/Patient Goals  Review   Personal Goals Review Increase Strength and Stamina  -      Review Pt feels like he's getting stronger and can do more with less fatigue.  He has started walking at home 2xs/wk and is doing well with workload increases Pt is compliant with HEP and is walking 2x/week without difficulty. Pt is responding well to WL increases.       Expected Outcomes Increase strength and stamina and overall fitness level  Pt will increase in aerobic capacity and overall fitness levels          Core Components/Risk Factors/Patient Goals at Discharge (Final Review):      Goals and Risk Factor Review - 11/28/16 1504      Core Components/Risk Factors/Patient Goals Review   Review Pt is compliant with HEP and is walking 2x/week without difficulty. Pt is responding well to WL increases.    Expected Outcomes Pt will increase in aerobic capacity and overall fitness levels  ITP Comments:     ITP Comments    Row Name 09/19/16 1341 10/27/16 1228         ITP Comments Medical Director- Dr. Fransico Him, MD. Attended CPR education class.  outcomes and goals met          Comments:  Pt is making expected progress toward personal goals after completing 20 sessions. Pt moved to a later class time due to his present job ending. This new schedule works well for him.Psychosocial Assessment  Pt with good support at home. Pt interacts positively with healthy coping skills. Generally feels good about his outlook. Recommend continued exercise and life style modification education including  stress management and relaxation techniques to decrease cardiac risk profile. Cherre Huger, BSN

## 2016-12-01 ENCOUNTER — Encounter (HOSPITAL_COMMUNITY): Payer: BLUE CROSS/BLUE SHIELD

## 2016-12-04 ENCOUNTER — Encounter (HOSPITAL_COMMUNITY): Payer: BLUE CROSS/BLUE SHIELD

## 2016-12-04 ENCOUNTER — Encounter (HOSPITAL_COMMUNITY)
Admission: RE | Admit: 2016-12-04 | Discharge: 2016-12-04 | Disposition: A | Discharge status: Home / Self Care | Payer: BLUE CROSS/BLUE SHIELD | Source: Ambulatory Visit | Attending: Cardiology | Admitting: Cardiology

## 2016-12-04 DIAGNOSIS — Z955 Presence of coronary angioplasty implant and graft: Secondary | ICD-10-CM

## 2016-12-04 DIAGNOSIS — I214 Non-ST elevation (NSTEMI) myocardial infarction: Secondary | ICD-10-CM | POA: Diagnosis not present

## 2016-12-06 ENCOUNTER — Encounter (HOSPITAL_COMMUNITY): Payer: BLUE CROSS/BLUE SHIELD

## 2016-12-08 ENCOUNTER — Encounter (HOSPITAL_COMMUNITY)
Admission: RE | Admit: 2016-12-08 | Payer: BLUE CROSS/BLUE SHIELD | Source: Ambulatory Visit | Attending: Cardiology | Admitting: Cardiology

## 2016-12-08 ENCOUNTER — Encounter (HOSPITAL_COMMUNITY): Payer: BLUE CROSS/BLUE SHIELD

## 2016-12-11 ENCOUNTER — Encounter (HOSPITAL_COMMUNITY): Payer: BLUE CROSS/BLUE SHIELD

## 2016-12-11 ENCOUNTER — Encounter (HOSPITAL_COMMUNITY)
Admission: RE | Admit: 2016-12-11 | Discharge: 2016-12-11 | Disposition: A | Discharge status: Home / Self Care | Payer: BLUE CROSS/BLUE SHIELD | Source: Ambulatory Visit | Attending: Cardiology | Admitting: Cardiology

## 2016-12-11 DIAGNOSIS — Z955 Presence of coronary angioplasty implant and graft: Secondary | ICD-10-CM

## 2016-12-11 DIAGNOSIS — I214 Non-ST elevation (NSTEMI) myocardial infarction: Secondary | ICD-10-CM

## 2016-12-13 ENCOUNTER — Encounter (HOSPITAL_COMMUNITY): Payer: BLUE CROSS/BLUE SHIELD

## 2016-12-13 ENCOUNTER — Encounter (HOSPITAL_COMMUNITY)
Admission: RE | Admit: 2016-12-13 | Discharge: 2016-12-13 | Disposition: A | Discharge status: Home / Self Care | Payer: BLUE CROSS/BLUE SHIELD | Source: Ambulatory Visit | Attending: Cardiology | Admitting: Cardiology

## 2016-12-13 DIAGNOSIS — I214 Non-ST elevation (NSTEMI) myocardial infarction: Secondary | ICD-10-CM

## 2016-12-13 DIAGNOSIS — Z955 Presence of coronary angioplasty implant and graft: Secondary | ICD-10-CM

## 2016-12-15 ENCOUNTER — Encounter (HOSPITAL_COMMUNITY): Payer: BLUE CROSS/BLUE SHIELD

## 2016-12-15 ENCOUNTER — Telehealth (HOSPITAL_COMMUNITY): Payer: Self-pay | Admitting: *Deleted

## 2016-12-15 ENCOUNTER — Encounter (HOSPITAL_COMMUNITY)
Admission: RE | Admit: 2016-12-15 | Payer: BLUE CROSS/BLUE SHIELD | Source: Ambulatory Visit | Attending: Cardiology | Admitting: Cardiology

## 2016-12-18 ENCOUNTER — Encounter (HOSPITAL_COMMUNITY): Payer: BLUE CROSS/BLUE SHIELD

## 2016-12-18 ENCOUNTER — Encounter (HOSPITAL_COMMUNITY)
Admission: RE | Admit: 2016-12-18 | Discharge: 2016-12-18 | Disposition: A | Discharge status: Home / Self Care | Payer: BLUE CROSS/BLUE SHIELD | Source: Ambulatory Visit | Attending: Cardiology | Admitting: Cardiology

## 2016-12-18 DIAGNOSIS — I214 Non-ST elevation (NSTEMI) myocardial infarction: Secondary | ICD-10-CM

## 2016-12-18 DIAGNOSIS — Z955 Presence of coronary angioplasty implant and graft: Secondary | ICD-10-CM

## 2016-12-20 ENCOUNTER — Ambulatory Visit (INDEPENDENT_AMBULATORY_CARE_PROVIDER_SITE_OTHER): Payer: Self-pay

## 2016-12-20 ENCOUNTER — Encounter (HOSPITAL_COMMUNITY): Payer: BLUE CROSS/BLUE SHIELD

## 2016-12-20 ENCOUNTER — Ambulatory Visit (INDEPENDENT_AMBULATORY_CARE_PROVIDER_SITE_OTHER): Payer: BLUE CROSS/BLUE SHIELD | Admitting: Orthopedic Surgery

## 2016-12-20 ENCOUNTER — Ambulatory Visit (INDEPENDENT_AMBULATORY_CARE_PROVIDER_SITE_OTHER): Payer: BLUE CROSS/BLUE SHIELD

## 2016-12-20 ENCOUNTER — Encounter (HOSPITAL_COMMUNITY)
Admission: RE | Admit: 2016-12-20 | Discharge: 2016-12-20 | Disposition: A | Discharge status: Home / Self Care | Payer: BLUE CROSS/BLUE SHIELD | Source: Ambulatory Visit | Attending: Cardiology | Admitting: Cardiology

## 2016-12-20 ENCOUNTER — Encounter (INDEPENDENT_AMBULATORY_CARE_PROVIDER_SITE_OTHER): Payer: Self-pay | Admitting: Orthopedic Surgery

## 2016-12-20 DIAGNOSIS — Z96652 Presence of left artificial knee joint: Secondary | ICD-10-CM

## 2016-12-20 DIAGNOSIS — Z96651 Presence of right artificial knee joint: Secondary | ICD-10-CM

## 2016-12-20 DIAGNOSIS — M1711 Unilateral primary osteoarthritis, right knee: Secondary | ICD-10-CM | POA: Diagnosis not present

## 2016-12-20 DIAGNOSIS — I214 Non-ST elevation (NSTEMI) myocardial infarction: Secondary | ICD-10-CM

## 2016-12-20 DIAGNOSIS — Z955 Presence of coronary angioplasty implant and graft: Secondary | ICD-10-CM

## 2016-12-20 MED ORDER — OXYCODONE HCL 5 MG PO TABS: 5.0000 mg | ORAL_TABLET | ORAL | 0 refills | Status: DC | PRN

## 2016-12-20 NOTE — Progress Notes (Signed)
Office Visit Note   Patient: Jacob Quinn           Date of Birth: 06/26/1955           MRN: [id number] Visit Date: 12/20/2016 Requested by: Dorena Cookey, MD [place], [place] [id number] PCP: Joycelyn Man, MD  Subjective: Chief Complaint  Patient presents with  . Left Knee - Pain  . Right Knee - Pain    HPI Jacob Quinn is a 61 year old patient with bilateral knee pain.  On the right knee he had unicompartmental knee replacement done 15 years ago.  That is held up generally well but he has had pain in the last several years with this knee.  He also has had total knee arthroplasty and revision on the left-hand side.  The tibial component has been loose and was noted loose 2 years ago.  Surgery recommended at that time but he wanted to hold off and wait as long as he could.  The patient has since reported increasing difficulty with ambulation and difficulty with prolonged weightbearing and standing.  He also has significant back issues which have required periodic injections for relief.  He has severe foraminal stenosis and arthritis in his lower back..              Review of Systems All systems reviewed are negative as they relate to the chief complaint within the history of present illness.  Patient denies  fevers or chills.    Assessment & Plan: Visit Diagnoses:  1. Presence of left artificial knee joint   2. Primary osteoarthritis of right knee   3. Presence of right artificial knee joint     Plan: Impression is bilateral knee pain with operative problems in both knees.  I think it is pretty remarkable that he is exiting been able to work over the past 5-6 years with the knees the way they have been.  Radiographs do demonstrate progressive lucency around the tibial component on that revised left knee replacement.  His extensor mechanism is intact.  On the right knee he has developed progressive tricompartmental arthritis.  The components appear to be still  fixed in the knee.  Nonetheless he has developed endstage arthritis in the other compartments.  Plan at this time is for revision arthroplasty of each knee.  The right knee would be the easier 1 to revise first and would give him a stable platform to walk on for the left knee which would be more difficult.  I will see him back in 3 months.  I think between his knees and back he is pretty incapable of performing any type of work of the type that he's been doing.  He has a very difficult time just getting up and around.  I do think that will be improved after surgical intervention.  He has had no fevers or chills final think this loosening is coming from infection.  I do want to check infection parameters prior to revision surgery  Follow-Up Instructions: No Follow-up on file.   Orders:  Orders Placed This Encounter  Procedures  . XR KNEE 3 VIEW LEFT  . XR Knee 1-2 Views Right   No orders of the defined types were placed in this encounter.     Procedures: No procedures performed   Clinical Data: No additional findings.  Objective: Vital Signs: There were no vitals taken for this visit.  Physical Exam   Constitutional: Patient appears well-developed HEENT:  Head: Normocephalic Eyes:EOM  are normal Neck: Normal range of motion Cardiovascular: Normal rate Pulmonary/chest: Effort normal Neurologic: Patient is alert Skin: Skin is warm Psychiatric: Patient has normal mood and affect    Ortho Exam orthopedic exam demonstrates antalgic gait primarily to the left.  Pedal pulses palpable.  Leg lengths approximately equal.  Right knee has range of motion with mild effusion from 5-95.  Left knee also has good range of motion from full extension to about 95 200 of flexion.  Collaterals are loose on the left-hand side consistent with subsidence of the tibial prosthesis.  Extensor mechanism is intact bilaterally.  Neither knee is warm.  There is no groin pain with internal/external rotation of  either leg.  Specialty Comments:  No specialty comments available.  Imaging: Xr Knee 1-2 Views Right  Result Date: 12/20/2016 3 views AP lateral oblique right knee reviewed.  She'll knee replacement on the medial side is present.  No loosening of the femoral component noted but severe tricompartmental os arthritic degenerative changes have developed.  Tibial component may also have developed loosening but it's difficult to assess.  Varus alignment is present.  Xr Knee 3 View Left  Result Date: 12/20/2016 Left knee 3 views reviewed.  Revision total knee prosthesis in position.  Compared to radiographs in 2 years ago there has been interval increase in the tibial loosening.  More subsidence has occurred.  No evidence of bony destruction.  Patella is located.    PMFS History: Patient Active Problem List   Diagnosis Date Noted  . Presence of left artificial knee joint 12/20/2016  . Presence of right artificial knee joint 12/20/2016  . Essential hypertension 08/08/2016  . Cardiomyopathy, ischemic 08/08/2016  . NSTEMI (non-ST elevated myocardial infarction) ([facility]) 08/07/2016  . Obesity (BMI 30-39.9) 08/07/2016  . Hyperlipidemia 08/07/2016  . CAD (coronary artery disease), native coronary artery 08/07/2016  . Restless leg syndrome 09/16/2010  . GERD 04/02/2008  . Hypertensive heart disease without CHF 11/22/2007  . Osteoarthritis 11/22/2007   Past Medical History:  Diagnosis Date  . BPH associated with nocturia   . CAD in native artery    a. NSTEMI 07/2016 -  LHC 08/07/16 showing 70% D1, 20% mLAD, segmental 95% then 75% OM stenosis, 85% acute marg, normal LVEDP - received PTCA/DES to OM.  Marland Kitchen Encephalitis, viral   . GERD (gastroesophageal reflux disease)   . Hyperlipidemia 08/07/2016  . Hypertension   . Ischemic cardiomyopathy    a. LHC 07/2016 - mild mid anterolateral focal hypocontractility felt to be due to the circumflex stenosis, EF 50%.  . OA (osteoarthritis)   . Obesity (BMI  30-39.9) 08/07/2016  . Renal insufficiency    a. 2016 r/t dehydration/ACEI use.  Marland Kitchen Restless leg syndrome 09/16/2010  . Rheumatic fever   . Syncope     Family History  Problem Relation Age of Onset  . Hypertension Mother   . Heart disease Mother   . Prostate cancer Father   . Dementia Father   . Depression Brother   . Hypertension Brother   . Hypertension Brother     Past Surgical History:  Procedure Laterality Date  . CARDIAC CATHETERIZATION N/A 08/07/2016   Procedure: Left Heart Cath and Coronary Angiography;  Surgeon: Troy Sine, MD;  Location: [facility];  Service: Cardiovascular;  Laterality: N/A;  . CARDIAC CATHETERIZATION N/A 08/07/2016   Procedure: Coronary Stent Intervention;  Surgeon: Troy Sine, MD;  Location: [facility];  Service: Cardiovascular;  Laterality: N/A;  . REPLACEMENT  TOTAL KNEE     left and right  . TONSILLECTOMY     Social History   Occupational History  . [employer]   Social History Main Topics  . Smoking status: Never Smoker  . Smokeless tobacco: Never Used  . Alcohol use No  . Drug use: No  . Sexual activity: Yes

## 2016-12-22 ENCOUNTER — Encounter (HOSPITAL_COMMUNITY)
Admission: RE | Admit: 2016-12-22 | Discharge: 2016-12-22 | Disposition: A | Discharge status: Home / Self Care | Payer: BLUE CROSS/BLUE SHIELD | Source: Ambulatory Visit | Attending: Cardiology | Admitting: Cardiology

## 2016-12-22 ENCOUNTER — Encounter (HOSPITAL_COMMUNITY): Payer: BLUE CROSS/BLUE SHIELD

## 2016-12-22 DIAGNOSIS — Z955 Presence of coronary angioplasty implant and graft: Secondary | ICD-10-CM

## 2016-12-22 DIAGNOSIS — I214 Non-ST elevation (NSTEMI) myocardial infarction: Secondary | ICD-10-CM

## 2016-12-25 ENCOUNTER — Encounter (HOSPITAL_COMMUNITY): Payer: BLUE CROSS/BLUE SHIELD

## 2016-12-25 ENCOUNTER — Encounter (HOSPITAL_COMMUNITY)
Admission: RE | Admit: 2016-12-25 | Discharge: 2016-12-25 | Disposition: A | Discharge status: Home / Self Care | Payer: BLUE CROSS/BLUE SHIELD | Source: Ambulatory Visit | Attending: Cardiology | Admitting: Cardiology

## 2016-12-25 DIAGNOSIS — I214 Non-ST elevation (NSTEMI) myocardial infarction: Secondary | ICD-10-CM

## 2016-12-25 DIAGNOSIS — Z955 Presence of coronary angioplasty implant and graft: Secondary | ICD-10-CM

## 2016-12-27 ENCOUNTER — Encounter (HOSPITAL_COMMUNITY): Payer: BLUE CROSS/BLUE SHIELD

## 2016-12-27 ENCOUNTER — Encounter (HOSPITAL_COMMUNITY): Payer: Self-pay

## 2016-12-27 NOTE — Progress Notes (Signed)
Cardiac Individual Treatment Plan  Patient Details  Name: Jacob Quinn MRN: [id number] Date of Birth: 06/26/1955 Referring Provider:   Flowsheet Row CARDIAC REHAB PHASE II ORIENTATION from 09/19/2016 in [place]  Referring Provider  Shelva Majestic MD      Initial Encounter Date:  [place] PHASE II ORIENTATION from 09/19/2016 in [place]  Date  09/19/16  Referring Provider  Shelva Majestic MD      Visit Diagnosis: 08/07/16 NSTEMI (non-ST elevated myocardial infarction) ([place])  08/07/16 Status post insertion of drug eluting coronary artery stent  Patient's Home Medications on Admission:  Current Outpatient Prescriptions:  .  amLODipine (NORVASC) 10 MG tablet, Take 1 tablet (10 mg total) by mouth daily., Disp: 30 tablet, Rfl: 6 .  aspirin EC 81 MG EC tablet, Take 1 tablet (81 mg total) by mouth daily., Disp: 30 tablet, Rfl: 11 .  atorvastatin (LIPITOR) 80 MG tablet, Take 1 tablet (80 mg total) by mouth every evening., Disp: 30 tablet, Rfl: 6 .  ibuprofen (ADVIL,MOTRIN) 800 MG tablet, Take 1 tablet (800 mg total) by mouth every 8 (eight) hours as needed., Disp: 30 tablet, Rfl: 0 .  Ibuprofen-Famotidine (DUEXIS) 800-26.6 MG TABS, Take 1 tablet by mouth 3 (three) times daily., Disp: 90 tablet, Rfl: 2 .  lisinopril (PRINIVIL,ZESTRIL) 20 MG tablet, Take 1 tablet (20 mg total) by mouth daily., Disp: 30 tablet, Rfl: 0 .  Multiple Vitamin (MULTI VITAMIN DAILY) TABS, Take 1 tablet by mouth daily., Disp: , Rfl:  .  nitroGLYCERIN (NITROSTAT) 0.4 MG SL tablet, Place 1 tablet (0.4 mg total) under the tongue every 5 (five) minutes as needed for chest pain. Up to 3 doses., Disp: 25 tablet, Rfl: 3 .  oxyCODONE (OXY IR/ROXICODONE) 5 MG immediate release tablet, Take 1 tablet (5 mg total) by mouth every 4 (four) hours as needed for severe pain., Disp: 40 tablet, Rfl: 0 .  ticagrelor (BRILINTA) 90 MG TABS tablet, Take 1  tablet (90 mg total) by mouth every 12 (twelve) hours., Disp: 180 tablet, Rfl: 3  Past Medical History: Past Medical History:  Diagnosis Date  . BPH associated with nocturia   . CAD in native artery    a. NSTEMI 07/2016 -  LHC 08/07/16 showing 70% D1, 20% mLAD, segmental 95% then 75% OM stenosis, 85% acute marg, normal LVEDP - received PTCA/DES to OM.  [place] Encephalitis, viral   . GERD (gastroesophageal reflux disease)   . Hyperlipidemia 08/07/2016  . Hypertension   . Ischemic cardiomyopathy    a. LHC 07/2016 - mild mid anterolateral focal hypocontractility felt to be due to the circumflex stenosis, EF 50%.  . OA (osteoarthritis)   . Obesity (BMI 30-39.9) 08/07/2016  . Renal insufficiency    a. 2016 r/t dehydration/ACEI use.  [place] Kitchen Restless leg syndrome 09/16/2010  . Rheumatic fever   . Syncope     Tobacco Use: History  Smoking Status  . Never Smoker  Smokeless Tobacco  . Never Used    Labs: Recent Review Flowsheet Data    Labs for ITP Cardiac and Pulmonary Rehab Latest Ref Rng & Units 01/12/2014 03/17/2014 01/30/2015 05/13/2015 08/07/2016   Cholestrol 0 - 200 mg/dL 200 208(H) - 200 216(H)   LDLCALC 0 - 99 mg/dL 120(H) 146(H) - 133(H) 156(H)   LDLDIRECT mg/dL - - - - -   HDL >40 mg/dL 39.00(L) 45.20 - 39.20 41   Trlycerides <150 mg/dL 207.0(H) 86.0 - 139.0 96  Hemoglobin A1c 4.8 - 5.6 % - - 5.6 - -      Capillary Blood Glucose: No results found for: GLUCAP   Exercise Target Goals:    Exercise Program Goal: Individual exercise prescription set with THRR, safety & activity barriers. Participant demonstrates ability to understand and report RPE using BORG scale, to self-measure pulse accurately, and to acknowledge the importance of the exercise prescription.  Exercise Prescription Goal: Starting with aerobic activity 30 plus minutes a day, 3 days per week for initial exercise prescription. Provide home exercise prescription and guidelines that participant acknowledges  understanding prior to discharge.  Activity Barriers & Risk Stratification:     Activity Barriers & Cardiac Risk Stratification - 09/19/16 1449      Activity Barriers & Cardiac Risk Stratification   Activity Barriers Joint Problems;Deconditioning;Muscular Weakness;Right Knee Replacement;Left Knee Replacement  partial L knee replacement, R TKR x2   Cardiac Risk Stratification Moderate      6 Minute Walk:     6 Minute Walk    Row Name 09/19/16 1632         6 Minute Walk   Phase Initial     Distance 1534 feet     Walk Time 6 minutes     # of Rest Breaks 0     MPH 2.9     METS 3.17     RPE 13     VO2 Peak 12.97     Symptoms No     Resting HR 65 bpm     Resting BP 128/84     Max Ex. HR 108 bpm     Max Ex. BP 150/90     2 Minute Post BP 134/80        Oxygen Initial Assessment:   Oxygen Re-Evaluation:   Oxygen Discharge (Final Oxygen Re-Evaluation):   Initial Exercise Prescription:     Initial Exercise Prescription - 09/19/16 1600      Date of Initial Exercise RX and Referring Provider   Date 09/19/16   Referring Provider Shelva Majestic MD     NuStep   Level 2   Minutes 10   METs 2     Arm Ergometer   Level 1   Minutes 10   METs 2     Track   Laps 10   Minutes 10   METs 2.74     Prescription Details   Frequency (times per week) 3   Duration Progress to 30 minutes of continuous aerobic without signs/symptoms of physical distress     Intensity   THRR 40-80% of Max Heartrate 63-126   Ratings of Perceived Exertion 11-13     Progression   Progression Continue progressive overload as per policy without signs/symptoms or physical distress.     Resistance Training   Training Prescription Yes   Weight 2lbs   Reps 10-12      Perform Capillary Blood Glucose checks as needed.  Exercise Prescription Changes:     Exercise Prescription Changes    Row Name 10/05/16 1200 10/19/16 1400 11/28/16 1400 12/26/16 1100       Response to Exercise    Blood Pressure (Admit) 150/90 140/90 146/100 124/84    Blood Pressure (Exercise) 166/94 160/90 142/80 158/82    Blood Pressure (Exit) 140/92 140/84 120/81 132/70    Heart Rate (Admit) 85 bpm 86 bpm 92 bpm 86 bpm    Heart Rate (Exercise) 99 bpm 110 bpm 109 bpm 105 bpm    Heart Rate (Exit) 58  bpm 70 bpm 64 bpm 86 bpm    Rating of Perceived Exertion (Exercise) _0 Duration Progress to 45 minutes of aerobic exercise without signs/symptoms of physical distress Progress to 45 minutes of aerobic exercise without signs/symptoms of physical distress Progress to 30 minutes of continuous aerobic without signs/symptoms of physical distress Progress to 30 minutes of  aerobic without signs/symptoms of physical distress    Intensity THRR unchanged THRR unchanged THRR unchanged THRR unchanged      Progression   Progression Continue to progress workloads to maintain intensity without signs/symptoms of physical distress. Continue to progress workloads to maintain intensity without signs/symptoms of physical distress. Continue to progress workloads to maintain intensity without signs/symptoms of physical distress. Continue to progress workloads to maintain intensity without signs/symptoms of physical distress.    Average METs 2.25 2.6 2.3 2.5      Resistance Training   Training Prescription Yes Yes Yes Yes    Weight 4lbs 6lbs 5lbs 5lbs    Reps 10-12 10-12 10-12 10-15      NuStep   Level _1 Minutes _2 METs 2.1 2.5 2.3 2.3      Arm Ergometer   Level _3 Minutes _4 METs 2.23 2.5 2.5 2.3      Track   Laps _5 -    Minutes _6 -    METs 2.74 2.76 2.23  -      Home Exercise Plan   Plans to continue exercise at  [facility] (comment)    Frequency  - Add 2 additional days to program exercise sessions. Add 2 additional days to program exercise sessions. Add 2 additional days to program exercise sessions.       Exercise Comments:      Exercise Comments    Row Name 10/05/16 1207 10/25/16 0809 11/28/16 1459 12/26/16 1450     Exercise Comments No exercise changes at this time, will continue to monitor.  Pt is off to a great start with exercise.  Reviewed METs and goals with pt.  Pt is doing well with exercise  Reviewed METs and goals. Pt is tolerating exercise well; will continue to monitor exercise progressions. Reviewed METs and goals. Pt is tolerating exercise well; will continue to monitor exercise progressions.       Exercise Goals and Review:     Exercise Goals    Row Name 12/26/16 1404             Exercise Goals   Increase Physical Activity Yes       Intervention Provide advice, education, support and counseling about physical activity/exercise needs.;Develop an individualized exercise prescription for aerobic and resistive training based on initial evaluation findings, risk stratification, comorbidities and participant's personal goals.       Expected Outcomes Achievement of increased cardiorespiratory fitness and enhanced flexibility, muscular endurance and strength shown through measurements of functional capacity and personal statement of participant.       Increase Strength and Stamina Yes       Intervention Provide advice, education, support and counseling about physical activity/exercise needs.;Develop an individualized exercise prescription for aerobic and resistive training based on initial evaluation findings, risk stratification, comorbidities and participant's personal goals.       Expected Outcomes Achievement of increased cardiorespiratory fitness and enhanced flexibility, muscular endurance  and strength shown through measurements of functional capacity and personal statement of participant.          Exercise Goals Re-Evaluation :     Exercise Goals Re-Evaluation    Row Name 12/26/16 1405 12/26/16 1430           Exercise Goal Re-Evaluation   Exercise Goals Review Increase Physical  Activity;Increase Strenth and Stamina  -      Comments Pt states "walking for HEP, 1x/week in addition to coming to cardiac rehab. Discussed adding an additional day at home. Pt is limited by B knee pain.   -      Expected Outcomes Pt will be able to exercise at home  Pt will be able to exercise at home 2x/week in addition to coming to cardiac rehab.          Discharge Exercise Prescription (Final Exercise Prescription Changes):     Exercise Prescription Changes - 12/26/16 1100      Response to Exercise   Blood Pressure (Admit) 124/84   Blood Pressure (Exercise) 158/82   Blood Pressure (Exit) 132/70   Heart Rate (Admit) 86 bpm   Heart Rate (Exercise) 105 bpm   Heart Rate (Exit) 86 bpm   Rating of Perceived Exertion (Exercise) 11   Duration Progress to 30 minutes of  aerobic without signs/symptoms of physical distress   Intensity THRR unchanged     Progression   Progression Continue to progress workloads to maintain intensity without signs/symptoms of physical distress.   Average METs 2.5     Resistance Training   Training Prescription Yes   Weight 5lbs   Reps 10-15     NuStep   Level 6   Minutes 10   METs 2.3     Arm Ergometer   Level 2   Minutes 20   METs 2.3     Home Exercise Plan   Plans to continue exercise at Home (comment)   Frequency Add 2 additional days to program exercise sessions.      Nutrition:  Target Goals: Understanding of nutrition guidelines, daily intake of sodium <1523m, cholesterol <2048m calories 30% from fat and 7% or less from saturated fats, daily to have 5 or more servings of fruits and vegetables.  Biometrics:      Post Biometrics - 09/19/16 1636       Post  Biometrics   Waist Circumference 42.5 inches   Hip Circumference 45 inches   Waist to Hip Ratio 0.94 %   Triceps Skinfold 19 mm   % Body Fat 30.6 %   Grip Strength 46 kg   Flexibility 14 in   Single Leg Stand 2.75 seconds      Nutrition Therapy Plan and Nutrition  Goals:     Nutrition Therapy & Goals - 10/04/16 1349      Nutrition Therapy   Diet Therapeutic Lifestyle Changes     Personal Nutrition Goals   Nutrition Goal 1-2 lb wt loss/week to a wt loss goal of 6-24 lb at [facility] and counsel regarding individualized specific dietary modifications aiming towards targeted core components such as weight, hypertension, lipid management, diabetes, heart failure and other comorbidities.   Expected Outcomes Short Term Goal: Understand basic principles of dietary content, such as calories, fat, sodium, cholesterol and nutrients.;Long Term Goal: Adherence to prescribed nutrition plan.      Nutrition Discharge: Nutrition Scores:     Nutrition Assessments -  10/04/16 1349      MEDFICTS Scores   Pre Score 33      Nutrition Goals Re-Evaluation:   Nutrition Goals Re-Evaluation:   Nutrition Goals Discharge (Final Nutrition Goals Re-Evaluation):   Psychosocial: Target Goals: Acknowledge presence or absence of significant depression and/or stress, maximize coping skills, provide positive support system. Participant is able to verbalize types and ability to use techniques and skills needed for reducing stress and depression.  Initial Review & Psychosocial Screening:     Initial Psych Review & Screening - 10/05/16 [place]? Yes      Quality of Life Scores:     Quality of Life - 09/19/16 1642      Quality of Life Scores   Health/Function Pre 24.8 %   Socioeconomic Pre 24.56 %   Psych/Spiritual Pre 24 %   Family Pre 30 %   GLOBAL Pre 25.33 %      PHQ-9: Recent Review Flowsheet Data    Depression screen [facility] 2/9 09/25/2016   Decreased Interest 1   Down, Depressed, Hopeless 1   PHQ - 2 Score 2     Interpretation of Total Score  Total Score Depression Severity:  1-4 = Minimal depression, 5-9 = Mild depression, 10-14  = Moderate depression, 15-19 = Moderately severe depression, 20-27 = Severe depression   Psychosocial Evaluation and Intervention:     Psychosocial Evaluation - 12/27/16 1849      Psychosocial Evaluation & Interventions   Interventions Encouraged to exercise with the program and follow exercise prescription   Comments Pt demonstrates positive and healthy outlook.  Supportive family.   Continue Psychosocial Services  No Follow up required      Psychosocial Re-Evaluation:     Psychosocial Re-Evaluation    [place] Name 10/05/16 1346 11/02/16 1330 11/30/16 1556 12/27/16 1850       Psychosocial Re-Evaluation   Current issues with  -  -  - None Identified    Comments  - denies any needs or interventions. denies any needs or interventions.  -    Interventions Encouraged to attend Cardiac Rehabilitation for the exercise;Stress management education;Relaxation education Encouraged to attend Pulmonary Rehabilitation for the exercise Encouraged to attend Cardiac Rehabilitation for the exercise;Relaxation education;Stress management education Encouraged to attend Cardiac Rehabilitation for the exercise       Psychosocial Discharge (Final Psychosocial Re-Evaluation):     Psychosocial Re-Evaluation - 12/27/16 1850      Psychosocial Re-Evaluation   Current issues with None Identified   Interventions Encouraged to attend Cardiac Rehabilitation for the exercise      Vocational Rehabilitation: Provide vocational rehab assistance to qualifying candidates.   Vocational Rehab Evaluation & Intervention:     Vocational Rehab - 09/19/16 1702      Initial Vocational Rehab Evaluation & Intervention   Assessment shows need for Vocational Rehabilitation No      Education: Education Goals: Education classes will be provided on a weekly basis, covering required topics. Participant will state understanding/return demonstration of topics presented.  Learning Barriers/Preferences:     Learning  Barriers/Preferences - 09/19/16 1450      Learning Barriers/Preferences   Learning Barriers Sight   Learning Preferences Skilled Demonstration;Pictoral;Video      Education Topics: Count Your Pulse:  -Group instruction provided by verbal instruction, demonstration, patient participation and written materials to support subject.  Instructors address importance of being able to find your pulse and how to count your pulse  when at home without a heart monitor.  Patients get hands on experience counting their pulse with staff help and individually.   Heart Attack, Angina, and Risk Factor Modification:  -Group instruction provided by verbal instruction, video, and written materials to support subject.  Instructors address signs and symptoms of angina and heart attacks.    Also discuss risk factors for heart disease and how to make changes to improve heart health risk factors.   Functional Fitness:  -Group instruction provided by verbal instruction, demonstration, patient participation, and written materials to support subject.  Instructors address safety measures for doing things around the house.  Discuss how to get up and down off the floor, how to pick things up properly, how to safely get out of a chair without assistance, and balance training.   Meditation and Mindfulness:  -Group instruction provided by verbal instruction, patient participation, and written materials to support subject.  Instructor addresses importance of mindfulness and meditation practice to help reduce stress and improve awareness.  Instructor also leads participants through a meditation exercise.    Stretching for Flexibility and Mobility:  -Group instruction provided by verbal instruction, patient participation, and written materials to support subject.  Instructors lead participants through series of stretches that are designed to increase flexibility thus improving mobility.  These stretches are additional exercise  for major muscle groups that are typically performed during regular warm up and cool down.   Hands Only CPR Anytime:  -Group instruction provided by verbal instruction, video, patient participation and written materials to support subject.  Instructors co-teach with AHA video for hands only CPR.  Participants get hands on experience with mannequins.   Nutrition I class: Heart Healthy Eating:  -Group instruction provided by PowerPoint slides, verbal discussion, and written materials to support subject matter. The instructor gives an explanation and review of the Therapeutic Lifestyle Changes diet recommendations, which includes a discussion on lipid goals, dietary fat, sodium, fiber, plant stanol/sterol esters, sugar, and the components of a well-balanced, healthy diet.   Nutrition II class: Lifestyle Skills:  -Group instruction provided by PowerPoint slides, verbal discussion, and written materials to support subject matter. The instructor gives an explanation and review of label reading, grocery shopping for heart health, heart healthy recipe modifications, and ways to make healthier choices when eating out.   Diabetes Question & Answer:  -Group instruction provided by PowerPoint slides, verbal discussion, and written materials to support subject matter. The instructor gives an explanation and review of diabetes co-morbidities, pre- and post-prandial blood glucose goals, pre-exercise blood glucose goals, signs, symptoms, and treatment of hypoglycemia and hyperglycemia, and foot care basics.   Diabetes Blitz:  -Group instruction provided by PowerPoint slides, verbal discussion, and written materials to support subject matter. The instructor gives an explanation and review of the physiology behind type 1 and type 2 diabetes, diabetes medications and rational behind using different medications, pre- and post-prandial blood glucose recommendations and Hemoglobin A1c goals, diabetes diet, and  exercise including blood glucose guidelines for exercising safely.    Portion Distortion:  -Group instruction provided by PowerPoint slides, verbal discussion, written materials, and food models to support subject matter. The instructor gives an explanation of serving size versus portion size, changes in portions sizes over the last 20 years, and what consists of a serving from each food group.   Stress Management:  -Group instruction provided by verbal instruction, video, and written materials to support subject matter.  Instructors review role of stress in heart disease and how  to cope with stress positively.     Exercising on Your Own:  -Group instruction provided by verbal instruction, power point, and written materials to support subject.  Instructors discuss benefits of exercise, components of exercise, frequency and intensity of exercise, and end points for exercise.  Also discuss use of nitroglycerin and activating EMS.  Review options of places to exercise outside of rehab.  Review guidelines for sex with heart disease.   Cardiac Drugs I:  -Group instruction provided by verbal instruction and written materials to support subject.  Instructor reviews cardiac drug classes: antiplatelets, anticoagulants, beta blockers, and statins.  Instructor discusses reasons, side effects, and lifestyle considerations for each drug class.   Cardiac Drugs II:  -Group instruction provided by verbal instruction and written materials to support subject.  Instructor reviews cardiac drug classes: angiotensin converting enzyme inhibitors (ACE-I), angiotensin II receptor blockers (ARBs), nitrates, and calcium channel blockers.  Instructor discusses reasons, side effects, and lifestyle considerations for each drug class.   Anatomy and Physiology of the Circulatory System:  -Group instruction provided by verbal instruction, video, and written materials to support subject.  Reviews functional anatomy of heart,  how it relates to various diagnoses, and what role the heart plays in the overall system.   Knowledge Questionnaire Score:     Knowledge Questionnaire Score - 09/19/16 1631      Knowledge Questionnaire Score   Pre Score 24/24      Core Components/Risk Factors/Patient Goals at Admission:     Personal Goals and Risk Factors at Admission - 09/19/16 1451      Core Components/Risk Factors/Patient Goals on Admission    Weight Management Yes;Weight Loss   Intervention Weight Management: Develop a combined nutrition and exercise program designed to reach desired caloric intake, while maintaining appropriate intake of nutrient and fiber, sodium and fats, and appropriate energy expenditure required for the weight goal.;Obesity: Provide education and appropriate resources to help participant work on and attain dietary goals.   Expected Outcomes Weight Maintenance: Understanding of the daily nutrition guidelines, which includes 25-35% calories from fat, 7% or less cal from saturated fats, less than 247m cholesterol, less than 1.5gm of sodium, & 5 or more servings of fruits and vegetables daily;Long Term: Adherence to nutrition and physical activity/exercise program aimed toward attainment of established weight goal;Short Term: Continue to assess and modify interventions until short term weight is achieved;Weight Loss: Understanding of general recommendations for a balanced deficit meal plan, which promotes 1-2 lb weight loss per week and includes a negative energy balance of [phone] kcal/d;Understanding recommendations for meals to include 15-35% energy as protein, 25-35% energy from fat, 35-60% energy from carbohydrates, less than 2031mof dietary cholesterol, 20-35 gm of total fiber daily;Understanding of distribution of calorie intake throughout the day with the consumption of 4-5 meals/snacks;Weight Gain: Understanding of general recommendations for a high calorie, high protein meal plan that promotes  weight gain by distributing calorie intake throughout the day with the consumption for 4-5 meals, snacks, and/or supplements   Sedentary Yes   Intervention Provide advice, education, support and counseling about physical activity/exercise needs.;Develop an individualized exercise prescription for aerobic and resistive training based on initial evaluation findings, risk stratification, comorbidities and participant's personal goals.   Expected Outcomes Achievement of increased cardiorespiratory fitness and enhanced flexibility, muscular endurance and strength shown through measurements of functional capacity and personal statement of participant.   Increase Strength and Stamina Yes   Intervention Provide advice, education, support and counseling about physical activity/exercise needs.;Develop  an individualized exercise prescription for aerobic and resistive training based on initial evaluation findings, risk stratification, comorbidities and participant's personal goals.   Expected Outcomes Achievement of increased cardiorespiratory fitness and enhanced flexibility, muscular endurance and strength shown through measurements of functional capacity and personal statement of participant.   Improve shortness of breath with ADL's Yes   Intervention Provide education, individualized exercise plan and daily activity instruction to help decrease symptoms of SOB with activities of daily living.   Expected Outcomes Short Term: Achieves a reduction of symptoms when performing activities of daily living.   Hypertension Yes   Intervention Provide education on lifestyle modifcations including regular physical activity/exercise, weight management, moderate sodium restriction and increased consumption of fresh fruit, vegetables, and low fat dairy, alcohol moderation, and smoking cessation.;Monitor prescription use compliance.   Expected Outcomes Long Term: Maintenance of blood pressure at goal levels.;Short Term:  Continued assessment and intervention until BP is < 140/19m HG in hypertensive participants. < 130/844mHG in hypertensive participants with diabetes, heart failure or chronic kidney disease.   Lipids Yes   Intervention Provide education and support for participant on nutrition & aerobic/resistive exercise along with prescribed medications to achieve LDL <7039mHDL >25m50m Expected Outcomes Short Term: Participant states understanding of desired cholesterol values and is compliant with medications prescribed. Participant is following exercise prescription and nutrition guidelines.;Long Term: Cholesterol controlled with medications as prescribed, with individualized exercise RX and with personalized nutrition plan. Value goals: LDL < 70mg37mL > 40 mg.   Personal Goal short: lose~10lbs and increase strength and stamina. Long: goal wt _0    Intervention Provide nutrition and exercise guidelines to assist with weightloss and improving cardiovascular fitness   Expected Outcomes Pt will be able to lose wt and increase strength and stamina      Core Components/Risk Factors/Patient Goals Review:      Goals and Risk Factor Review    Row Name 10/25/16 0810 11/28/16 1504           Core Components/Risk Factors/Patient Goals Review   Personal Goals Review Increase Strength and Stamina  -      Review Pt feels like he's getting stronger and can do more with less fatigue.  He has started walking at home 2xs/wk and is doing well with workload increases Pt is compliant with HEP and is walking 2x/week without difficulty. Pt is responding well to WL increases.       Expected Outcomes Increase strength and stamina and overall fitness level  Pt will increase in aerobic capacity and overall fitness levels          Core Components/Risk Factors/Patient Goals at Discharge (Final Review):      Goals and Risk Factor Review - 11/28/16 1504      Core Components/Risk Factors/Patient Goals Review   Review Pt  is compliant with HEP and is walking 2x/week without difficulty. Pt is responding well to WL increases.    Expected Outcomes Pt will increase in aerobic capacity and overall fitness levels       ITP Comments:     ITP Comments    Row Name 09/19/16 1341 10/27/16 1228         ITP Comments Medical Director- Dr. TraciFransico Him Attended CPR education class.  outcomes and goals met          Comments:  Pt is making expected progress toward personal goals after completing 28 sessions.The patient has since reported increasing difficulty with ambulation and difficulty with prolonged  weightbearing and standing.  He also has significant back issues which have required periodic injections for relief.  He has severe foraminal stenosis and arthritis in his lower back..  Psychosocial Assessment  Pt with good support at home. Pt interacts positively with healthy coping skills. Generally feels good about his outlook. Recommend continued exercise and life style modification education including  stress management and relaxation techniques to decrease cardiac risk profile. Cherre Huger, BSN

## 2016-12-29 ENCOUNTER — Encounter (HOSPITAL_COMMUNITY): Payer: BLUE CROSS/BLUE SHIELD

## 2017-01-01 ENCOUNTER — Encounter (HOSPITAL_COMMUNITY): Payer: BLUE CROSS/BLUE SHIELD

## 2017-01-01 ENCOUNTER — Encounter (HOSPITAL_COMMUNITY)
Admission: RE | Admit: 2017-01-01 | Discharge: 2017-01-01 | Disposition: A | Discharge status: Home / Self Care | Payer: BLUE CROSS/BLUE SHIELD | Source: Ambulatory Visit | Attending: Cardiology | Admitting: Cardiology

## 2017-01-01 DIAGNOSIS — I214 Non-ST elevation (NSTEMI) myocardial infarction: Secondary | ICD-10-CM | POA: Insufficient documentation

## 2017-01-01 DIAGNOSIS — Z955 Presence of coronary angioplasty implant and graft: Secondary | ICD-10-CM

## 2017-01-03 ENCOUNTER — Encounter (HOSPITAL_COMMUNITY): Payer: BLUE CROSS/BLUE SHIELD

## 2017-01-05 ENCOUNTER — Encounter (HOSPITAL_COMMUNITY): Payer: BLUE CROSS/BLUE SHIELD

## 2017-01-08 ENCOUNTER — Encounter (HOSPITAL_COMMUNITY): Payer: BLUE CROSS/BLUE SHIELD

## 2017-01-10 ENCOUNTER — Encounter (HOSPITAL_COMMUNITY): Payer: BLUE CROSS/BLUE SHIELD

## 2017-01-10 ENCOUNTER — Encounter (HOSPITAL_COMMUNITY)
Admission: RE | Admit: 2017-01-10 | Discharge: 2017-01-10 | Disposition: A | Discharge status: Home / Self Care | Payer: BLUE CROSS/BLUE SHIELD | Source: Ambulatory Visit | Attending: Cardiology | Admitting: Cardiology

## 2017-01-10 ENCOUNTER — Telehealth (HOSPITAL_COMMUNITY): Payer: Self-pay | Admitting: Family Medicine

## 2017-01-12 ENCOUNTER — Encounter (HOSPITAL_COMMUNITY)
Admission: RE | Admit: 2017-01-12 | Discharge: 2017-01-12 | Disposition: A | Discharge status: Home / Self Care | Payer: BLUE CROSS/BLUE SHIELD | Source: Ambulatory Visit | Attending: Cardiology | Admitting: Cardiology

## 2017-01-12 ENCOUNTER — Encounter (HOSPITAL_COMMUNITY): Payer: BLUE CROSS/BLUE SHIELD

## 2017-01-12 ENCOUNTER — Other Ambulatory Visit (INDEPENDENT_AMBULATORY_CARE_PROVIDER_SITE_OTHER): Payer: Self-pay | Admitting: Family

## 2017-01-12 ENCOUNTER — Telehealth (INDEPENDENT_AMBULATORY_CARE_PROVIDER_SITE_OTHER): Payer: Self-pay | Admitting: Orthopedic Surgery

## 2017-01-12 DIAGNOSIS — I214 Non-ST elevation (NSTEMI) myocardial infarction: Secondary | ICD-10-CM

## 2017-01-12 DIAGNOSIS — Z955 Presence of coronary angioplasty implant and graft: Secondary | ICD-10-CM

## 2017-01-12 MED ORDER — IBUPROFEN 800 MG PO TABS: 800.0000 mg | ORAL_TABLET | Freq: Three times a day (TID) | ORAL | 0 refills | Status: DC | PRN

## 2017-01-12 NOTE — Telephone Encounter (Signed)
Can you please advise?

## 2017-01-12 NOTE — Telephone Encounter (Signed)
Patient called asking for a refill on 800mg  Ibuprofen. Cb # [phone]

## 2017-01-12 NOTE — Telephone Encounter (Signed)
Tried to call patient to advise rx submitted to pharmacy. No answer. No voicemail set up to leave message.

## 2017-01-12 NOTE — Telephone Encounter (Signed)
I sent to pharmacy.

## 2017-01-12 NOTE — Progress Notes (Signed)
Jacob Quinn 61 y.o. male Nutrition Note Spoke with pt. Nutrition Plan and Nutrition Survey goals reviewed with pt. Pt is following Step 1 of the Therapeutic Lifestyle Changes diet. Pt wants to lose wt. Per discussion, pt has not been able to lose wt so far. Wt loss tips reviewed. Pt with dx of CHF. Per discussion, pt has not received low sodium nutrition education. Pt educated re: low sodium nutrition therapy. Pt eats out frequently; 4-5 meals/week. Ways to decrease sodium intake when eating out discussed. Pt expressed understanding of the information reviewed. Pt aware of nutrition education classes offered.  Lab Results  Component Value Date   HGBA1C 5.6 01/30/2015   Wt Readings from Last 3 Encounters:  09/19/16 235 lb 3.7 oz (106.7 kg)  08/24/16 238 lb 12.8 oz (108.3 kg)  08/08/16 243 lb 2.7 oz (110.3 kg)    Nutrition Diagnosis ? Food-and nutrition-related knowledge deficit related to lack of exposure to information as related to diagnosis of: ? CVD ? Obesity related to excessive energy intake as evidenced by a BMI of 32  Nutrition Intervention ? Pt's individual nutrition plan reviewed with pt. ? Benefits of adopting Therapeutic Lifestyle Changes discussed when Medficts reviewed. ? Pt to attend the Portion Distortion class ? Pt to attend the   ? Nutrition I class                   ? Nutrition II class  ? Pt given handouts for: ? Nutrition I class ? Nutrition II class ? low sodium nutrition therapy ? Continue client-centered nutrition education by RD, as part of interdisciplinary care. Goal(s) ? Pt to identify and limit food sources of saturated fat, trans fat, and sodium ? Pt to identify food quantities necessary to achieve weight loss of 6-24 lb (2.7-10.9 kg) at graduation from cardiac rehab.  Monitor and Evaluate progress toward nutrition goal with team. Derek Mound, M.Ed, RD, LDN, CDE 01/12/2017 12:26 PM

## 2017-01-15 ENCOUNTER — Encounter (HOSPITAL_COMMUNITY): Payer: BLUE CROSS/BLUE SHIELD

## 2017-01-15 ENCOUNTER — Encounter (HOSPITAL_COMMUNITY)
Admission: RE | Admit: 2017-01-15 | Discharge: 2017-01-15 | Disposition: A | Discharge status: Home / Self Care | Payer: BLUE CROSS/BLUE SHIELD | Source: Ambulatory Visit | Attending: Cardiology | Admitting: Cardiology

## 2017-01-15 DIAGNOSIS — I214 Non-ST elevation (NSTEMI) myocardial infarction: Secondary | ICD-10-CM

## 2017-01-15 DIAGNOSIS — Z955 Presence of coronary angioplasty implant and graft: Secondary | ICD-10-CM

## 2017-01-17 ENCOUNTER — Encounter (HOSPITAL_COMMUNITY)
Admission: RE | Admit: 2017-01-17 | Payer: BLUE CROSS/BLUE SHIELD | Source: Ambulatory Visit | Attending: Cardiology | Admitting: Cardiology

## 2017-01-17 ENCOUNTER — Encounter (HOSPITAL_COMMUNITY): Payer: BLUE CROSS/BLUE SHIELD

## 2017-01-19 ENCOUNTER — Encounter (HOSPITAL_COMMUNITY)
Admission: RE | Admit: 2017-01-19 | Discharge: 2017-01-19 | Disposition: A | Discharge status: Home / Self Care | Payer: BLUE CROSS/BLUE SHIELD | Source: Ambulatory Visit | Attending: Cardiology | Admitting: Cardiology

## 2017-01-19 ENCOUNTER — Encounter (HOSPITAL_COMMUNITY): Payer: BLUE CROSS/BLUE SHIELD

## 2017-01-19 DIAGNOSIS — Z955 Presence of coronary angioplasty implant and graft: Secondary | ICD-10-CM

## 2017-01-19 DIAGNOSIS — I214 Non-ST elevation (NSTEMI) myocardial infarction: Secondary | ICD-10-CM

## 2017-01-22 ENCOUNTER — Encounter (HOSPITAL_COMMUNITY): Payer: BLUE CROSS/BLUE SHIELD

## 2017-01-22 ENCOUNTER — Encounter (HOSPITAL_COMMUNITY)
Admission: RE | Admit: 2017-01-22 | Discharge: 2017-01-22 | Disposition: A | Discharge status: Home / Self Care | Payer: BLUE CROSS/BLUE SHIELD | Source: Ambulatory Visit | Attending: Cardiology | Admitting: Cardiology

## 2017-01-22 ENCOUNTER — Telehealth (INDEPENDENT_AMBULATORY_CARE_PROVIDER_SITE_OTHER): Payer: Self-pay | Admitting: Physical Medicine and Rehabilitation

## 2017-01-22 ENCOUNTER — Encounter: Payer: Self-pay | Admitting: Cardiology

## 2017-01-22 DIAGNOSIS — Z955 Presence of coronary angioplasty implant and graft: Secondary | ICD-10-CM

## 2017-01-22 DIAGNOSIS — I214 Non-ST elevation (NSTEMI) myocardial infarction: Secondary | ICD-10-CM

## 2017-01-22 NOTE — Telephone Encounter (Signed)
ok 

## 2017-01-22 NOTE — Telephone Encounter (Signed)
Called patient- no answer and voicemail not set up yet.

## 2017-01-23 ENCOUNTER — Encounter: Payer: Self-pay | Admitting: Adult Health

## 2017-01-23 ENCOUNTER — Ambulatory Visit (INDEPENDENT_AMBULATORY_CARE_PROVIDER_SITE_OTHER): Payer: BLUE CROSS/BLUE SHIELD | Admitting: Adult Health

## 2017-01-23 VITALS — BP 168/80 | Temp 97.5°F | Wt 240.2 lb

## 2017-01-23 DIAGNOSIS — I10 Essential (primary) hypertension: Secondary | ICD-10-CM

## 2017-01-23 MED ORDER — LISINOPRIL 30 MG PO TABS: 30.0000 mg | ORAL_TABLET | Freq: Every day | ORAL | 1 refills | Status: DC

## 2017-01-23 NOTE — Progress Notes (Signed)
Subjective:    Patient ID: Jacob Quinn, male    DOB: 06/26/1955, 61 y.o.   MRN: [id number]  HPI   61 year old male who  has a past medical history of BPH associated with nocturia; CAD in native artery; Encephalitis, viral; GERD (gastroesophageal reflux disease); Hyperlipidemia (08/07/2016); Hypertension; Ischemic cardiomyopathy; OA (osteoarthritis); Obesity (BMI 30-39.9) (08/07/2016); Renal insufficiency; Restless leg syndrome (09/16/2010); Rheumatic fever; and Syncope. He presents to the office today for elevated blood pressure readings. He has been going to cardiac rehab s/p stent placement but has been sent home multiple times for elevated blood pressure readings in the 160-180's.   He is asymptomatic   Follows up with Cardiology in May    BP Readings from Last 3 Encounters:  01/23/17 (!) 168/80  09/19/16 128/84  08/24/16 (!) 143/87    Review of Systems See HPI   Past Medical History:  Diagnosis Date  . BPH associated with nocturia   . CAD in native artery    a. NSTEMI 07/2016 -  LHC 08/07/16 showing 70% D1, 20% mLAD, segmental 95% then 75% OM stenosis, 85% acute marg, normal LVEDP - received PTCA/DES to OM.  Marland Kitchen Encephalitis, viral   . GERD (gastroesophageal reflux disease)   . Hyperlipidemia 08/07/2016  . Hypertension   . Ischemic cardiomyopathy    a. LHC 07/2016 - mild mid anterolateral focal hypocontractility felt to be due to the circumflex stenosis, EF 50%.  . OA (osteoarthritis)   . Obesity (BMI 30-39.9) 08/07/2016  . Renal insufficiency    a. 2016 r/t dehydration/ACEI use.  Marland Kitchen Restless leg syndrome 09/16/2010  . Rheumatic fever   . Syncope     Social History   Social History  . Marital status: Single    Spouse name: N/A  . Number of children: 3  . Years of education: N/A   Occupational History  . [employer]   Social History Main Topics  . Smoking status: Never Smoker  . Smokeless tobacco: Never Used  . Alcohol  use No  . Drug use: No  . Sexual activity: Yes   Other Topics Concern  . Not on file   Social History Narrative   Lives with daughter, works for [employer] and drives trucks part-time at night    Past Surgical History:  Procedure Laterality Date  . CARDIAC CATHETERIZATION N/A 08/07/2016   Procedure: Left Heart Cath and Coronary Angiography;  Surgeon: Troy Sine, MD;  Location: [facility];  Service: Cardiovascular;  Laterality: N/A;  . CARDIAC CATHETERIZATION N/A 08/07/2016   Procedure: Coronary Stent Intervention;  Surgeon: Troy Sine, MD;  Location: [facility];  Service: Cardiovascular;  Laterality: N/A;  . REPLACEMENT TOTAL KNEE     left and right  . TONSILLECTOMY      Family History  Problem Relation Age of Onset  . Hypertension Mother   . Heart disease Mother   . Prostate cancer Father   . Dementia Father   . Depression Brother   . Hypertension Brother   . Hypertension Brother     Allergies  Allergen Reactions  . Penicillins Anaphylaxis    Current Outpatient Prescriptions on File Prior to Visit  Medication Sig Dispense Refill  . amLODipine (NORVASC) 10 MG tablet Take 1 tablet (10 mg total) by mouth daily. 30 tablet 6  . aspirin EC 81 MG EC tablet Take 1 tablet (81 mg total) by mouth daily. 30 tablet  11  . atorvastatin (LIPITOR) 80 MG tablet Take 1 tablet (80 mg total) by mouth every evening. 30 tablet 6  . ibuprofen (ADVIL,MOTRIN) 800 MG tablet Take 1 tablet (800 mg total) by mouth every 8 (eight) hours as needed. 30 tablet 0  . Ibuprofen-Famotidine (DUEXIS) 800-26.6 MG TABS Take 1 tablet by mouth 3 (three) times daily. 90 tablet 2  . Multiple Vitamin (MULTI VITAMIN DAILY) TABS Take 1 tablet by mouth daily.    . nitroGLYCERIN (NITROSTAT) 0.4 MG SL tablet Place 1 tablet (0.4 mg total) under the tongue every 5 (five) minutes as needed for chest pain. Up to 3 doses. 25 tablet 3  . oxyCODONE (OXY IR/ROXICODONE) 5 MG immediate release tablet Take 1 tablet  (5 mg total) by mouth every 4 (four) hours as needed for severe pain. 40 tablet 0  . ticagrelor (BRILINTA) 90 MG TABS tablet Take 1 tablet (90 mg total) by mouth every 12 (twelve) hours. 180 tablet 3   No current facility-administered medications on file prior to visit.     BP (!) 168/80 (BP Location: Left Arm, Patient Position: Sitting, Cuff Size: Large)   Temp 97.5 F (36.4 C) (Oral)   Wt 240 lb 3.2 oz (109 kg)   BMI 32.58 kg/m       Objective:   Physical Exam  Constitutional: He is oriented to person, place, and time. He appears well-developed and well-nourished. No distress.  Cardiovascular: Normal rate, regular rhythm, normal heart sounds and intact distal pulses.  Exam reveals no gallop and no friction rub.   No murmur heard. Pulmonary/Chest: Effort normal and breath sounds normal. No respiratory distress. He has no wheezes. He has no rales. He exhibits no tenderness.  Neurological: He is alert and oriented to person, place, and time.  Skin: Skin is warm and dry. No rash noted. He is not diaphoretic. No erythema. No pallor.  Psychiatric: He has a normal mood and affect. His behavior is normal. Judgment and thought content normal.  Nursing note and vitals reviewed.     Assessment & Plan:  1. Essential hypertension - Will increase lisinopril to 30 mg. I would like him to follow up with his PCP or cardiologist for further adjustments - lisinopril (PRINIVIL,ZESTRIL) 30 MG tablet; Take 1 tablet (30 mg total) by mouth daily.  Dispense: 90 tablet; Refill: 1  Dorothyann Peng, NP

## 2017-01-24 ENCOUNTER — Encounter (HOSPITAL_COMMUNITY): Payer: Self-pay

## 2017-01-24 NOTE — Telephone Encounter (Signed)
s/w pt and he is on brilinta now. Adv him we would have to get clearance from his cardiologist for him to be able to come off of prior to injection. He is unsure of what doctor he sees but goes to the [facility] office @ [facility]. According to system he has an appt with Dr. Claiborne Billings coming up so I faxed blood thinner d/c form to his attn @ [phone]. Pt is scheduled for 02/06/17 w/driver.

## 2017-01-24 NOTE — Progress Notes (Signed)
Cardiac Individual Treatment Plan  Patient Details  Name: Jacob Quinn MRN: [id number] Date of Birth: 06/26/1955 Referring Provider:     CARDIAC REHAB PHASE II ORIENTATION from 09/19/2016 in [place]  Referring Provider  Shelva Majestic MD      Initial Encounter Date:    CARDIAC REHAB PHASE II ORIENTATION from 09/19/2016 in [place]  Date  09/19/16  Referring Provider  Shelva Majestic MD      Visit Diagnosis: 08/07/16 Status post insertion of drug eluting coronary artery stent  08/07/16 NSTEMI (non-ST elevated myocardial infarction) [facility])  Patient's Home Medications on Admission:  Current Outpatient Prescriptions:  .  amLODipine (NORVASC) 10 MG tablet, Take 1 tablet (10 mg total) by mouth daily., Disp: 30 tablet, Rfl: 6 .  aspirin EC 81 MG EC tablet, Take 1 tablet (81 mg total) by mouth daily., Disp: 30 tablet, Rfl: 11 .  atorvastatin (LIPITOR) 80 MG tablet, Take 1 tablet (80 mg total) by mouth every evening., Disp: 30 tablet, Rfl: 6 .  ibuprofen (ADVIL,MOTRIN) 800 MG tablet, Take 1 tablet (800 mg total) by mouth every 8 (eight) hours as needed., Disp: 30 tablet, Rfl: 0 .  Ibuprofen-Famotidine (DUEXIS) 800-26.6 MG TABS, Take 1 tablet by mouth 3 (three) times daily., Disp: 90 tablet, Rfl: 2 .  lisinopril (PRINIVIL,ZESTRIL) 30 MG tablet, Take 1 tablet (30 mg total) by mouth daily., Disp: 90 tablet, Rfl: 1 .  Multiple Vitamin (MULTI VITAMIN DAILY) TABS, Take 1 tablet by mouth daily., Disp: , Rfl:  .  nitroGLYCERIN (NITROSTAT) 0.4 MG SL tablet, Place 1 tablet (0.4 mg total) under the tongue every 5 (five) minutes as needed for chest pain. Up to 3 doses., Disp: 25 tablet, Rfl: 3 .  oxyCODONE (OXY IR/ROXICODONE) 5 MG immediate release tablet, Take 1 tablet (5 mg total) by mouth every 4 (four) hours as needed for severe pain., Disp: 40 tablet, Rfl: 0 .  ticagrelor (BRILINTA) 90 MG TABS tablet, Take 1 tablet (90 mg total) by  mouth every 12 (twelve) hours., Disp: 180 tablet, Rfl: 3  Past Medical History: Past Medical History:  Diagnosis Date  . BPH associated with nocturia   . CAD in native artery    a. NSTEMI 07/2016 -  LHC 08/07/16 showing 70% D1, 20% mLAD, segmental 95% then 75% OM stenosis, 85% acute marg, normal LVEDP - received PTCA/DES to OM.  [place] Encephalitis, viral   . GERD (gastroesophageal reflux disease)   . Hyperlipidemia 08/07/2016  . Hypertension   . Ischemic cardiomyopathy    a. LHC 07/2016 - mild mid anterolateral focal hypocontractility felt to be due to the circumflex stenosis, EF 50%.  . OA (osteoarthritis)   . Obesity (BMI 30-39.9) 08/07/2016  . Renal insufficiency    a. 2016 r/t dehydration/ACEI use.  [place] Restless leg syndrome 09/16/2010  . Rheumatic fever   . Syncope     Tobacco Use: History  Smoking Status  . Never Smoker  Smokeless Tobacco  . Never Used    Labs: Recent Review Flowsheet Data    Labs for ITP Cardiac and Pulmonary Rehab Latest Ref Rng & Units 01/12/2014 03/17/2014 01/30/2015 05/13/2015 08/07/2016   Cholestrol 0 - 200 mg/dL 200 208(H) - 200 216(H)   LDLCALC 0 - 99 mg/dL 120(H) 146(H) - 133(H) 156(H)   LDLDIRECT mg/dL - - - - -   HDL >40 mg/dL 39.00(L) 45.20 - 39.20 41   Trlycerides <150 mg/dL 207.0(H) 86.0 - 139.0 96  Hemoglobin A1c 4.8 - 5.6 % - - 5.6 - -      Capillary Blood Glucose: No results found for: GLUCAP   Exercise Target Goals:    Exercise Program Goal: Individual exercise prescription set with THRR, safety & activity barriers. Participant demonstrates ability to understand and report RPE using BORG scale, to self-measure pulse accurately, and to acknowledge the importance of the exercise prescription.  Exercise Prescription Goal: Starting with aerobic activity 30 plus minutes a day, 3 days per week for initial exercise prescription. Provide home exercise prescription and guidelines that participant acknowledges understanding prior to  discharge.  Activity Barriers & Risk Stratification:     Activity Barriers & Cardiac Risk Stratification - 09/19/16 1449      Activity Barriers & Cardiac Risk Stratification   Activity Barriers Joint Problems;Deconditioning;Muscular Weakness;Right Knee Replacement;Left Knee Replacement  partial L knee replacement, R TKR x2   Cardiac Risk Stratification Moderate      6 Minute Walk:     6 Minute Walk    Row Name 09/19/16 1632         6 Minute Walk   Phase Initial     Distance 1534 feet     Walk Time 6 minutes     # of Rest Breaks 0     MPH 2.9     METS 3.17     RPE 13     VO2 Peak 12.97     Symptoms No     Resting HR 65 bpm     Resting BP 128/84     Max Ex. HR 108 bpm     Max Ex. BP 150/90     2 Minute Post BP 134/80        Oxygen Initial Assessment:   Oxygen Re-Evaluation:   Oxygen Discharge (Final Oxygen Re-Evaluation):   Initial Exercise Prescription:     Initial Exercise Prescription - 09/19/16 1600      Date of Initial Exercise RX and Referring Provider   Date 09/19/16   Referring Provider Shelva Majestic MD     NuStep   Level 2   Minutes 10   METs 2     Arm Ergometer   Level 1   Minutes 10   METs 2     Track   Laps 10   Minutes 10   METs 2.74     Prescription Details   Frequency (times per week) 3   Duration Progress to 30 minutes of continuous aerobic without signs/symptoms of physical distress     Intensity   THRR 40-80% of Max Heartrate 63-126   Ratings of Perceived Exertion 11-13     Progression   Progression Continue progressive overload as per policy without signs/symptoms or physical distress.     Resistance Training   Training Prescription Yes   Weight 2lbs   Reps 10-12      Perform Capillary Blood Glucose checks as needed.  Exercise Prescription Changes:     Exercise Prescription Changes    Row Name 10/05/16 1200 10/19/16 1400 11/28/16 1400 12/26/16 1100 01/01/17 1110     Response to Exercise   Blood  Pressure (Admit) 150/90 140/90 146/100 124/84 148/82   Blood Pressure (Exercise) 166/94 160/90 142/80 158/82 158/94   Blood Pressure (Exit) 140/92 140/84 120/81 132/70 138/86   Heart Rate (Admit) 85 bpm 86 bpm 92 bpm 86 bpm 57 bpm   Heart Rate (Exercise) 99 bpm 110 bpm 109 bpm 105 bpm 95 bpm   Heart Rate (  Exit) 58 bpm 70 bpm 64 bpm 86 bpm 57 bpm   Rating of Perceived Exertion (Exercise) '11 11 12 11 11   ' Symptoms  -  -  -  - B knee pain   Duration Progress to 45 minutes of aerobic exercise without signs/symptoms of physical distress Progress to 45 minutes of aerobic exercise without signs/symptoms of physical distress Progress to 30 minutes of continuous aerobic without signs/symptoms of physical distress Progress to 30 minutes of  aerobic without signs/symptoms of physical distress Progress to 30 minutes of  aerobic without signs/symptoms of physical distress   Intensity THRR unchanged THRR unchanged THRR unchanged THRR unchanged THRR unchanged     Progression   Progression Continue to progress workloads to maintain intensity without signs/symptoms of physical distress. Continue to progress workloads to maintain intensity without signs/symptoms of physical distress. Continue to progress workloads to maintain intensity without signs/symptoms of physical distress. Continue to progress workloads to maintain intensity without signs/symptoms of physical distress. Continue to progress workloads to maintain intensity without signs/symptoms of physical distress.   Average METs 2.25 2.6 2.3 2.5 2.4     Resistance Training   Training Prescription Yes Yes Yes Yes Yes   Weight 4lbs 6lbs 5lbs 5lbs 5lbs   Reps 10-12 10-12 10-12 10-15 10-15   Time  -  -  -  - 10 Minutes     NuStep   Level '2 3 6 6 6   ' Minutes '10 10 10 10 10   ' METs 2.1 2.5 2.3 2.3 2.5     Arm Ergometer   Level '1 2 2 2 2   ' Minutes '10 10 10 20 20   ' METs 2.23 2.5 2.5 2.3 2.23     Track   Laps '10 10 7  ' -  -   Minutes '10 10 10  ' -  -    METs 2.74 2.76 2.23  -  -     Home Exercise Plan   Plans to continue exercise at  - [place] (comment) Home (comment)   Frequency  - Add 2 additional days to program exercise sessions. Add 2 additional days to program exercise sessions. Add 2 additional days to program exercise sessions. Add 2 additional days to program exercise sessions.   Initial Home Exercises Provided  -  -  -  - 10/11/16      Exercise Comments:     Exercise Comments    Row Name 10/05/16 1207 10/25/16 0809 11/28/16 1459 12/26/16 1450     Exercise Comments No exercise changes at this time, will continue to monitor.  Pt is off to a great start with exercise.  Reviewed METs and goals with pt.  Pt is doing well with exercise  Reviewed METs and goals. Pt is tolerating exercise well; will continue to monitor exercise progressions. Reviewed METs and goals. Pt is tolerating exercise well; will continue to monitor exercise progressions.       Exercise Goals and Review:     Exercise Goals    Row Name 12/26/16 1404             Exercise Goals   Increase Physical Activity Yes       Intervention Provide advice, education, support and counseling about physical activity/exercise needs.;Develop an individualized exercise prescription for aerobic and resistive training based on initial evaluation findings, risk stratification, comorbidities and participant's personal goals.       Expected Outcomes Achievement of increased cardiorespiratory fitness and enhanced flexibility, muscular endurance and  strength shown through measurements of functional capacity and personal statement of participant.       Increase Strength and Stamina Yes       Intervention Provide advice, education, support and counseling about physical activity/exercise needs.;Develop an individualized exercise prescription for aerobic and resistive training based on initial evaluation findings, risk stratification, comorbidities and participant's personal goals.        Expected Outcomes Achievement of increased cardiorespiratory fitness and enhanced flexibility, muscular endurance and strength shown through measurements of functional capacity and personal statement of participant.          Exercise Goals Re-Evaluation :     Exercise Goals Re-Evaluation    Row Name 12/26/16 1405 12/26/16 1430           Exercise Goal Re-Evaluation   Exercise Goals Review Increase Physical Activity;Increase Strenth and Stamina  -      Comments Pt states "walking for HEP, 1x/week in addition to coming to cardiac rehab. Discussed adding an additional day at home. Pt is limited by B knee pain.   -      Expected Outcomes Pt will be able to exercise at home  Pt will be able to exercise at home 2x/week in addition to coming to cardiac rehab.          Discharge Exercise Prescription (Final Exercise Prescription Changes):     Exercise Prescription Changes - 01/01/17 1110      Response to Exercise   Blood Pressure (Admit) 148/82   Blood Pressure (Exercise) 158/94   Blood Pressure (Exit) 138/86   Heart Rate (Admit) 57 bpm   Heart Rate (Exercise) 95 bpm   Heart Rate (Exit) 57 bpm   Rating of Perceived Exertion (Exercise) 11   Symptoms B knee pain   Duration Progress to 30 minutes of  aerobic without signs/symptoms of physical distress   Intensity THRR unchanged     Progression   Progression Continue to progress workloads to maintain intensity without signs/symptoms of physical distress.   Average METs 2.4     Resistance Training   Training Prescription Yes   Weight 5lbs   Reps 10-15   Time 10 Minutes     NuStep   Level 6   Minutes 10   METs 2.5     Arm Ergometer   Level 2   Minutes 20   METs 2.23     Home Exercise Plan   Plans to continue exercise at Home (comment)   Frequency Add 2 additional days to program exercise sessions.   Initial Home Exercises Provided 10/11/16      Nutrition:  Target Goals: Understanding of nutrition guidelines,  daily intake of sodium <1560m, cholesterol <2069m calories 30% from fat and 7% or less from saturated fats, daily to have 5 or more servings of fruits and vegetables.  Biometrics:      Post Biometrics - 09/19/16 1636       Post  Biometrics   Waist Circumference 42.5 inches   Hip Circumference 45 inches   Waist to Hip Ratio 0.94 %   Triceps Skinfold 19 mm   % Body Fat 30.6 %   Grip Strength 46 kg   Flexibility 14 in   Single Leg Stand 2.75 seconds      Nutrition Therapy Plan and Nutrition Goals:     Nutrition Therapy & Goals - 10/04/16 1349      Nutrition Therapy   Diet Therapeutic Lifestyle Changes     Personal Nutrition Goals  Nutrition Goal 1-2 lb wt loss/week to a wt loss goal of 6-24 lb at graduation from [place], educate and counsel regarding individualized specific dietary modifications aiming towards targeted core components such as weight, hypertension, lipid management, diabetes, heart failure and other comorbidities.   Expected Outcomes Short Term Goal: Understand basic principles of dietary content, such as calories, fat, sodium, cholesterol and nutrients.;Long Term Goal: Adherence to prescribed nutrition plan.      Nutrition Discharge: Nutrition Scores:     Nutrition Assessments - 01/12/17 1225      MEDFICTS Scores   Pre Score 47      Nutrition Goals Re-Evaluation:   Nutrition Goals Re-Evaluation:   Nutrition Goals Discharge (Final Nutrition Goals Re-Evaluation):   Psychosocial: Target Goals: Acknowledge presence or absence of significant depression and/or stress, maximize coping skills, provide positive support system. Participant is able to verbalize types and ability to use techniques and skills needed for reducing stress and depression.  Initial Review & Psychosocial Screening:     Initial Psych Review & Screening - 10/05/16 [place]? Yes       Quality of Life Scores:     Quality of Life - 09/19/16 1642      Quality of Life Scores   Health/Function Pre 24.8 %   Socioeconomic Pre 24.56 %   Psych/Spiritual Pre 24 %   Family Pre 30 %   GLOBAL Pre 25.33 %      PHQ-9: Recent Review Flowsheet Data    Depression screen [facility] 2/9 09/25/2016   Decreased Interest 1   Down, Depressed, Hopeless 1   PHQ - 2 Score 2     Interpretation of Total Score  Total Score Depression Severity:  1-4 = Minimal depression, 5-9 = Mild depression, 10-14 = Moderate depression, 15-19 = Moderately severe depression, 20-27 = Severe depression   Psychosocial Evaluation and Intervention:     Psychosocial Evaluation - 12/27/16 1849      Psychosocial Evaluation & Interventions   Interventions Encouraged to exercise with the program and follow exercise prescription   Comments Pt demonstrates positive and healthy outlook.  Supportive family.   Continue Psychosocial Services  No Follow up required      Psychosocial Re-Evaluation:     Psychosocial Re-Evaluation    [place] Name 10/05/16 1346 11/30/16 1556 12/27/16 1850 01/24/17 1434       Psychosocial Re-Evaluation   Current issues with  -  - None Identified None Identified    Comments  - denies any needs or interventions.  - Pt has supportive family, pt interacts positvely with fellow participants.    Interventions Encouraged to attend Cardiac Rehabilitation for the exercise;Stress management education;Relaxation education Encouraged to attend Cardiac Rehabilitation for the exercise;Relaxation education;Stress management education Encouraged to attend Cardiac Rehabilitation for the exercise Encouraged to attend Cardiac Rehabilitation for the exercise       Psychosocial Discharge (Final Psychosocial Re-Evaluation):     Psychosocial Re-Evaluation - 01/24/17 1434      Psychosocial Re-Evaluation   Current issues with None Identified   Comments Pt has supportive family, pt interacts positvely  with fellow participants.   Interventions Encouraged to attend Cardiac Rehabilitation for the exercise      Vocational Rehabilitation: Provide vocational rehab assistance to qualifying candidates.   Vocational Rehab Evaluation & Intervention:     Vocational Rehab - 09/19/16 1702  Initial Vocational Rehab Evaluation & Intervention   Assessment shows need for Vocational Rehabilitation No      Education: Education Goals: Education classes will be provided on a weekly basis, covering required topics. Participant will state understanding/return demonstration of topics presented.  Learning Barriers/Preferences:     Learning Barriers/Preferences - 09/19/16 1450      Learning Barriers/Preferences   Learning Barriers Sight   Learning Preferences Skilled Demonstration;Pictoral;Video      Education Topics: Count Your Pulse:  -Group instruction provided by verbal instruction, demonstration, patient participation and written materials to support subject.  Instructors address importance of being able to find your pulse and how to count your pulse when at home without a heart monitor.  Patients get hands on experience counting their pulse with staff help and individually.   Heart Attack, Angina, and Risk Factor Modification:  -Group instruction provided by verbal instruction, video, and written materials to support subject.  Instructors address signs and symptoms of angina and heart attacks.    Also discuss risk factors for heart disease and how to make changes to improve heart health risk factors.   Functional Fitness:  -Group instruction provided by verbal instruction, demonstration, patient participation, and written materials to support subject.  Instructors address safety measures for doing things around the house.  Discuss how to get up and down off the floor, how to pick things up properly, how to safely get out of a chair without assistance, and balance  training.   Meditation and Mindfulness:  -Group instruction provided by verbal instruction, patient participation, and written materials to support subject.  Instructor addresses importance of mindfulness and meditation practice to help reduce stress and improve awareness.  Instructor also leads participants through a meditation exercise.    Stretching for Flexibility and Mobility:  -Group instruction provided by verbal instruction, patient participation, and written materials to support subject.  Instructors lead participants through series of stretches that are designed to increase flexibility thus improving mobility.  These stretches are additional exercise for major muscle groups that are typically performed during regular warm up and cool down.   Hands Only CPR Anytime:  -Group instruction provided by verbal instruction, video, patient participation and written materials to support subject.  Instructors co-teach with AHA video for hands only CPR.  Participants get hands on experience with mannequins.   Nutrition I class: Heart Healthy Eating:  -Group instruction provided by PowerPoint slides, verbal discussion, and written materials to support subject matter. The instructor gives an explanation and review of the Therapeutic Lifestyle Changes diet recommendations, which includes a discussion on lipid goals, dietary fat, sodium, fiber, plant stanol/sterol esters, sugar, and the components of a well-balanced, healthy diet.   CARDIAC REHAB PHASE II EXERCISE from 01/12/2017 in [place]  Date  01/12/17  Educator  RD  Instruction Review Code  Not applicable [class handouts given]      Nutrition II class: Lifestyle Skills:  -Group instruction provided by PowerPoint slides, verbal discussion, and written materials to support subject matter. The instructor gives an explanation and review of label reading, grocery shopping for heart health, heart healthy recipe  modifications, and ways to make healthier choices when eating out.   CARDIAC REHAB PHASE II EXERCISE from 01/12/2017 in [place]  Date  01/05/17  Educator  RD  Instruction Review Code  Not applicable [class handouts given]      Diabetes Question & Answer:  -Group instruction provided by PowerPoint slides,  verbal discussion, and written materials to support subject matter. The instructor gives an explanation and review of diabetes co-morbidities, pre- and post-prandial blood glucose goals, pre-exercise blood glucose goals, signs, symptoms, and treatment of hypoglycemia and hyperglycemia, and foot care basics.   Diabetes Blitz:  -Group instruction provided by PowerPoint slides, verbal discussion, and written materials to support subject matter. The instructor gives an explanation and review of the physiology behind type 1 and type 2 diabetes, diabetes medications and rational behind using different medications, pre- and post-prandial blood glucose recommendations and Hemoglobin A1c goals, diabetes diet, and exercise including blood glucose guidelines for exercising safely.    Portion Distortion:  -Group instruction provided by PowerPoint slides, verbal discussion, written materials, and food models to support subject matter. The instructor gives an explanation of serving size versus portion size, changes in portions sizes over the last 20 years, and what consists of a serving from each food group.   Stress Management:  -Group instruction provided by verbal instruction, video, and written materials to support subject matter.  Instructors review role of stress in heart disease and how to cope with stress positively.     Exercising on Your Own:  -Group instruction provided by verbal instruction, power point, and written materials to support subject.  Instructors discuss benefits of exercise, components of exercise, frequency and intensity of exercise, and end  points for exercise.  Also discuss use of nitroglycerin and activating EMS.  Review options of places to exercise outside of rehab.  Review guidelines for sex with heart disease.   Cardiac Drugs I:  -Group instruction provided by verbal instruction and written materials to support subject.  Instructor reviews cardiac drug classes: antiplatelets, anticoagulants, beta blockers, and statins.  Instructor discusses reasons, side effects, and lifestyle considerations for each drug class.   Cardiac Drugs II:  -Group instruction provided by verbal instruction and written materials to support subject.  Instructor reviews cardiac drug classes: angiotensin converting enzyme inhibitors (ACE-I), angiotensin II receptor blockers (ARBs), nitrates, and calcium channel blockers.  Instructor discusses reasons, side effects, and lifestyle considerations for each drug class.   Anatomy and Physiology of the Circulatory System:  -Group instruction provided by verbal instruction, video, and written materials to support subject.  Reviews functional anatomy of heart, how it relates to various diagnoses, and what role the heart plays in the overall system.   Knowledge Questionnaire Score:     Knowledge Questionnaire Score - 09/19/16 1631      Knowledge Questionnaire Score   Pre Score 24/24      Core Components/Risk Factors/Patient Goals at Admission:     Personal Goals and Risk Factors at Admission - 09/19/16 1451      Core Components/Risk Factors/Patient Goals on Admission    Weight Management Yes;Weight Loss   Intervention Weight Management: Develop a combined nutrition and exercise program designed to reach desired caloric intake, while maintaining appropriate intake of nutrient and fiber, sodium and fats, and appropriate energy expenditure required for the weight goal.;Obesity: Provide education and appropriate resources to help participant work on and attain dietary goals.   Expected Outcomes Weight  Maintenance: Understanding of the daily nutrition guidelines, which includes 25-35% calories from fat, 7% or less cal from saturated fats, less than 258m cholesterol, less than 1.5gm of sodium, & 5 or more servings of fruits and vegetables daily;Long Term: Adherence to nutrition and physical activity/exercise program aimed toward attainment of established weight goal;Short Term: Continue to assess and modify interventions until short term weight is  achieved;Weight Loss: Understanding of general recommendations for a balanced deficit meal plan, which promotes 1-2 lb weight loss per week and includes a negative energy balance of [phone] kcal/d;Understanding recommendations for meals to include 15-35% energy as protein, 25-35% energy from fat, 35-60% energy from carbohydrates, less than 255m of dietary cholesterol, 20-35 gm of total fiber daily;Understanding of distribution of calorie intake throughout the day with the consumption of 4-5 meals/snacks;Weight Gain: Understanding of general recommendations for a high calorie, high protein meal plan that promotes weight gain by distributing calorie intake throughout the day with the consumption for 4-5 meals, snacks, and/or supplements   Sedentary Yes   Intervention Provide advice, education, support and counseling about physical activity/exercise needs.;Develop an individualized exercise prescription for aerobic and resistive training based on initial evaluation findings, risk stratification, comorbidities and participant's personal goals.   Expected Outcomes Achievement of increased cardiorespiratory fitness and enhanced flexibility, muscular endurance and strength shown through measurements of functional capacity and personal statement of participant.   Increase Strength and Stamina Yes   Intervention Provide advice, education, support and counseling about physical activity/exercise needs.;Develop an individualized exercise prescription for aerobic and resistive  training based on initial evaluation findings, risk stratification, comorbidities and participant's personal goals.   Expected Outcomes Achievement of increased cardiorespiratory fitness and enhanced flexibility, muscular endurance and strength shown through measurements of functional capacity and personal statement of participant.   Improve shortness of breath with ADL's Yes   Intervention Provide education, individualized exercise plan and daily activity instruction to help decrease symptoms of SOB with activities of daily living.   Expected Outcomes Short Term: Achieves a reduction of symptoms when performing activities of daily living.   Hypertension Yes   Intervention Provide education on lifestyle modifcations including regular physical activity/exercise, weight management, moderate sodium restriction and increased consumption of fresh fruit, vegetables, and low fat dairy, alcohol moderation, and smoking cessation.;Monitor prescription use compliance.   Expected Outcomes Long Term: Maintenance of blood pressure at goal levels.;Short Term: Continued assessment and intervention until BP is < 140/924mHG in hypertensive participants. < 130/8058mG in hypertensive participants with diabetes, heart failure or chronic kidney disease.   Lipids Yes   Intervention Provide education and support for participant on nutrition & aerobic/resistive exercise along with prescribed medications to achieve LDL <63m51mDL >40mg89mExpected Outcomes Short Term: Participant states understanding of desired cholesterol values and is compliant with medications prescribed. Participant is following exercise prescription and nutrition guidelines.;Long Term: Cholesterol controlled with medications as prescribed, with individualized exercise RX and with personalized nutrition plan. Value goals: LDL < 63mg,20m > 40 mg.   Personal Goal short: lose~10lbs and increase strength and stamina. Long: goal wt '@220lbs'    Intervention  Provide nutrition and exercise guidelines to assist with weightloss and improving cardiovascular fitness   Expected Outcomes Pt will be able to lose wt and increase strength and stamina      Core Components/Risk Factors/Patient Goals Review:      Goals and Risk Factor Review    Row Name 10/25/16 0810 11/28/16 1504           Core Components/Risk Factors/Patient Goals Review   Personal Goals Review Increase Strength and Stamina  -      Review Pt feels like he's getting stronger and can do more with less fatigue.  He has started walking at home 2xs/wk and is doing well with workload increases Pt is compliant with HEP and is walking 2x/week without difficulty. Pt is responding  well to WL increases.       Expected Outcomes Increase strength and stamina and overall fitness level  Pt will increase in aerobic capacity and overall fitness levels          Core Components/Risk Factors/Patient Goals at Discharge (Final Review):      Goals and Risk Factor Review - 11/28/16 1504      Core Components/Risk Factors/Patient Goals Review   Review Pt is compliant with HEP and is walking 2x/week without difficulty. Pt is responding well to WL increases.    Expected Outcomes Pt will increase in aerobic capacity and overall fitness levels       ITP Comments:     ITP Comments    Row Name 09/19/16 1341 10/27/16 1228         ITP Comments Medical Director- Dr. Fransico Him, MD. Attended CPR education class.  outcomes and goals met          Comments:  Pt is making expected progress toward personal goals after completing  31 sessions.Psychosocial Assessment reveals no barriers to participating in cardiac rehab.  Pt interacts with staff and fellow participants with support and encouragement. Pt will graduate on 3/30. Recommend continued exercise and life style modification education including  stress management and relaxation techniques to decrease cardiac risk profile. Cherre Huger,  BSN Cardiac and Training and development officer

## 2017-01-24 NOTE — Telephone Encounter (Incomplete)
s/w pt and he is

## 2017-01-26 ENCOUNTER — Encounter (HOSPITAL_COMMUNITY)
Admission: RE | Admit: 2017-01-26 | Discharge: 2017-01-26 | Disposition: A | Discharge status: Home / Self Care | Payer: BLUE CROSS/BLUE SHIELD | Source: Ambulatory Visit | Attending: Cardiology | Admitting: Cardiology

## 2017-01-26 ENCOUNTER — Encounter (HOSPITAL_COMMUNITY): Payer: Self-pay

## 2017-01-26 VITALS — Ht 72.0 in | Wt 238.1 lb

## 2017-01-26 DIAGNOSIS — Z955 Presence of coronary angioplasty implant and graft: Secondary | ICD-10-CM

## 2017-01-26 DIAGNOSIS — I214 Non-ST elevation (NSTEMI) myocardial infarction: Secondary | ICD-10-CM | POA: Diagnosis not present

## 2017-01-26 NOTE — Progress Notes (Signed)
Discharge Summary  Patient Details  Name: Jacob Quinn MRN: [id number] Date of Birth: 06/26/1955 Referring Provider:     CARDIAC REHAB PHASE II ORIENTATION from 09/19/2016 in [place]  Referring Provider  Shelva Majestic MD       Number of Visits: 32  Reason for Discharge:  Patient reached a stable level of exercise. Patient independent in their exercise.  Smoking History:  History  Smoking Status  . Never Smoker  Smokeless Tobacco  . Never Used    Diagnosis:  08/07/16 Status post insertion of drug eluting coronary artery stent  08/07/16 NSTEMI (non-ST elevated myocardial infarction) [facility])  ADL UCSD:   Initial Exercise Prescription:     Initial Exercise Prescription - 09/19/16 1600      Date of Initial Exercise RX and Referring Provider   Date 09/19/16   Referring Provider Shelva Majestic MD     NuStep   Level 2   Minutes 10   METs 2     Arm Ergometer   Level 1   Minutes 10   METs 2     Track   Laps 10   Minutes 10   METs 2.74     Prescription Details   Frequency (times per week) 3   Duration Progress to 30 minutes of continuous aerobic without signs/symptoms of physical distress     Intensity   THRR 40-80% of Max Heartrate 63-126   Ratings of Perceived Exertion 11-13     Progression   Progression Continue progressive overload as per policy without signs/symptoms or physical distress.     Resistance Training   Training Prescription Yes   Weight 2lbs   Reps 10-12      Discharge Exercise Prescription (Final Exercise Prescription Changes):     Exercise Prescription Changes - 01/31/17 1500      Response to Exercise   Blood Pressure (Admit) --   Blood Pressure (Exercise) --   Blood Pressure (Exit) --   Heart Rate (Admit) --   Heart Rate (Exercise) --   Heart Rate (Exit) --   Rating of Perceived Exertion (Exercise) --   Symptoms --   Duration --   Intensity --     Progression   Progression --   Average METs --     Resistance Training   Training Prescription --   Weight --   Reps --   Time --     NuStep   Level --   Minutes --   METs --     Arm Ergometer   Level --   Minutes --   METs --     Home Exercise Plan   Plans to continue exercise at --   Frequency --   Initial Home Exercises Provided --      Functional Capacity:     6 Minute Walk    Row Name 09/19/16 1632 01/31/17 1613       6 Minute Walk   Phase Initial Discharge    Distance 1534 feet 1651 feet    Walk Time 6 minutes 6 minutes    # of Rest Breaks 0 0    MPH 2.9 3.12    METS 3.17 4.1    RPE 13 11    VO2 Peak 12.97 14.24    Symptoms No Yes (comment)    Comments  - B knee pain    Resting HR 65 bpm 74 bpm    Resting BP 128/84 158/86    Max  Ex. HR 108 bpm 114 bpm    Max Ex. BP 150/90 164/96    2 Minute Post BP 134/80 140/86       Psychological, QOL, Others - Outcomes: PHQ 2/9: Depression screen [facility] 2/9 01/26/2017 09/25/2016  Decreased Interest 0 1  Down, Depressed, Hopeless 0 1  PHQ - 2 Score 0 2    Quality of Life:     Quality of Life - 01/31/17 1503      Quality of Life Scores   Health/Function Pre 24.8 %   Health/Function Post 28.8 %   Health/Function % Change 16.13 %   Socioeconomic Pre 24.56 %   Socioeconomic Post 29 %   Socioeconomic % Change  18.08 %   Psych/Spiritual Pre 24 %   Psych/Spiritual Post 30 %   Psych/Spiritual % Change 25 %   Family Pre 30 %   Family Post 30 %   Family % Change 0 %   GLOBAL Pre 25.33 %   GLOBAL Post 29.27 %   GLOBAL % Change 15.55 %      Personal Goals: Goals established at orientation with interventions provided to work toward goal.     Personal Goals and Risk Factors at Admission - 09/19/16 1451      Core Components/Risk Factors/Patient Goals on Admission    Weight Management Yes;Weight Loss   Intervention Weight Management: Develop a combined nutrition and exercise program designed to reach desired caloric intake, while  maintaining appropriate intake of nutrient and fiber, sodium and fats, and appropriate energy expenditure required for the weight goal.;Obesity: Provide education and appropriate resources to help participant work on and attain dietary goals.   Expected Outcomes Weight Maintenance: Understanding of the daily nutrition guidelines, which includes 25-35% calories from fat, 7% or less cal from saturated fats, less than 243m cholesterol, less than 1.5gm of sodium, & 5 or more servings of fruits and vegetables daily;Long Term: Adherence to nutrition and physical activity/exercise program aimed toward attainment of established weight goal;Short Term: Continue to assess and modify interventions until short term weight is achieved;Weight Loss: Understanding of general recommendations for a balanced deficit meal plan, which promotes 1-2 lb weight loss per week and includes a negative energy balance of [phone] kcal/d;Understanding recommendations for meals to include 15-35% energy as protein, 25-35% energy from fat, 35-60% energy from carbohydrates, less than 2025mof dietary cholesterol, 20-35 gm of total fiber daily;Understanding of distribution of calorie intake throughout the day with the consumption of 4-5 meals/snacks;Weight Gain: Understanding of general recommendations for a high calorie, high protein meal plan that promotes weight gain by distributing calorie intake throughout the day with the consumption for 4-5 meals, snacks, and/or supplements   Sedentary Yes   Intervention Provide advice, education, support and counseling about physical activity/exercise needs.;Develop an individualized exercise prescription for aerobic and resistive training based on initial evaluation findings, risk stratification, comorbidities and participant's personal goals.   Expected Outcomes Achievement of increased cardiorespiratory fitness and enhanced flexibility, muscular endurance and strength shown through measurements of  functional capacity and personal statement of participant.   Increase Strength and Stamina Yes   Intervention Provide advice, education, support and counseling about physical activity/exercise needs.;Develop an individualized exercise prescription for aerobic and resistive training based on initial evaluation findings, risk stratification, comorbidities and participant's personal goals.   Expected Outcomes Achievement of increased cardiorespiratory fitness and enhanced flexibility, muscular endurance and strength shown through measurements of functional capacity and personal statement of participant.   Improve shortness of breath  with ADL's Yes   Intervention Provide education, individualized exercise plan and daily activity instruction to help decrease symptoms of SOB with activities of daily living.   Expected Outcomes Short Term: Achieves a reduction of symptoms when performing activities of daily living.   Hypertension Yes   Intervention Provide education on lifestyle modifcations including regular physical activity/exercise, weight management, moderate sodium restriction and increased consumption of fresh fruit, vegetables, and low fat dairy, alcohol moderation, and smoking cessation.;Monitor prescription use compliance.   Expected Outcomes Long Term: Maintenance of blood pressure at goal levels.;Short Term: Continued assessment and intervention until BP is < 140/64m HG in hypertensive participants. < 130/864mHG in hypertensive participants with diabetes, heart failure or chronic kidney disease.   Lipids Yes   Intervention Provide education and support for participant on nutrition & aerobic/resistive exercise along with prescribed medications to achieve LDL <7067mHDL >38m38m Expected Outcomes Short Term: Participant states understanding of desired cholesterol values and is compliant with medications prescribed. Participant is following exercise prescription and nutrition guidelines.;Long Term:  Cholesterol controlled with medications as prescribed, with individualized exercise RX and with personalized nutrition plan. Value goals: LDL < 70mg87mL > 40 mg.   Personal Goal short: lose~10lbs and increase strength and stamina. Long: goal wt '@220lbs'    Intervention Provide nutrition and exercise guidelines to assist with weightloss and improving cardiovascular fitness   Expected Outcomes Pt will be able to lose wt and increase strength and stamina       Personal Goals Discharge:     Goals and Risk Factor Review    Row Name 10/25/16 0810 11/28/16 1504 01/31/17 1618         Core Components/Risk Factors/Patient Goals Review   Personal Goals Review Increase Strength and Stamina  - Weight Management/Obesity     Review Pt feels like he's getting stronger and can do more with less fatigue.  He has started walking at home 2xs/wk and is doing well with workload increases Pt is compliant with HEP and is walking 2x/week without difficulty. Pt is responding well to WL increases.  Pt has gained 2.9 lb. Wt loss goal not met.      Expected Outcomes Increase strength and stamina and overall fitness level  Pt will increase in aerobic capacity and overall fitness levels  Pt to continue to work toward step 2 of the Therapeutic Lifestyle Changes diet to help promote desired wt loss.        Nutrition & Weight - Outcomes:      Post Biometrics - 01/31/17 1612       Post  Biometrics   Height 6' (1.829 m)   Weight 238 lb 1.6 oz (108 kg)   Waist Circumference 40.5 inches   Hip Circumference 43.5 inches   Waist to Hip Ratio 0.93 %   BMI (Calculated) 32.4   Triceps Skinfold 18 mm   % Body Fat 29.6 %   Grip Strength 41 kg   Flexibility 13 in   Single Leg Stand 1.31 seconds      Nutrition:     Nutrition Therapy & Goals - 10/04/16 1349      Nutrition Therapy   Diet Therapeutic Lifestyle Changes     Personal Nutrition Goals   Nutrition Goal 1-2 lb wt loss/week to a wt loss goal of 6-24 lb at  graduation from [facility] and counsel regarding individualized specific dietary modifications aiming towards targeted core components  such as weight, hypertension, lipid management, diabetes, heart failure and other comorbidities.   Expected Outcomes Short Term Goal: Understand basic principles of dietary content, such as calories, fat, sodium, cholesterol and nutrients.;Long Term Goal: Adherence to prescribed nutrition plan.      Nutrition Discharge:     Nutrition Assessments - 01/12/17 1225      MEDFICTS Scores   Pre Score 47      Education Questionnaire Score:     Knowledge Questionnaire Score - 01/29/17 1621      Knowledge Questionnaire Score   Post Score 22/24      Goals reviewed with patient. Pt graduated from cardiac rehab program today with completion of 32 exercise sessions in Phase II. Pt maintained good attendance and progressed nicely during his participation in rehab as evidenced by increased MET level. However this was hampered due to pt musculoskeletal complaints. Pt needs to have knee replacement and tentatively is looking at the end of April. Pt also had issues with elevation in bp which also effected workloads and the progression of workloads.   Medication list reconciled. Repeat  PHQ score-0.  Pt completed post quality of life survey.  Pt scored the following     Quality of Life - 01/31/17 1503      Quality of Life Scores   Health/Function Pre 24.8 %   Health/Function Post 28.8 %   Health/Function % Change 16.13 %   Socioeconomic Pre 24.56 %   Socioeconomic Post 29 %   Socioeconomic % Change  18.08 %   Psych/Spiritual Pre 24 %   Psych/Spiritual Post 30 %   Psych/Spiritual % Change 25 %   Family Pre 30 %   Family Post 30 %   Family % Change 0 %   GLOBAL Pre 25.33 %   GLOBAL Post 29.27 %   GLOBAL % Change 15.55 %       Pt has made lifestyle changes and should be commended for his success.  Pt feels he has made progress toward achieving his goals during cardiac rehab. Pt desired to lose 10 pounds.  Pt weight showed little change however pt felt his clothes felt loose.  Pt feels he has increased strength and stamina. Pt has returned to all activities that he did before his heart eent.Pt plans to continue exercise as tolerated with issues with his knee. It was a delight to have this pt in our exercise program. Maurice Small RN, BSN Cardiac and Pulmonary Rehab Nurse Navigator

## 2017-01-31 NOTE — Telephone Encounter (Signed)
Patient scheduled.

## 2017-02-01 ENCOUNTER — Ambulatory Visit (INDEPENDENT_AMBULATORY_CARE_PROVIDER_SITE_OTHER): Payer: BLUE CROSS/BLUE SHIELD | Admitting: Adult Health

## 2017-02-01 ENCOUNTER — Encounter: Payer: Self-pay | Admitting: Adult Health

## 2017-02-01 VITALS — BP 184/98 | Temp 97.9°F | Wt 238.8 lb

## 2017-02-01 DIAGNOSIS — I10 Essential (primary) hypertension: Secondary | ICD-10-CM

## 2017-02-01 DIAGNOSIS — Z7689 Persons encountering health services in other specified circumstances: Secondary | ICD-10-CM | POA: Diagnosis not present

## 2017-02-01 DIAGNOSIS — I214 Non-ST elevation (NSTEMI) myocardial infarction: Secondary | ICD-10-CM

## 2017-02-01 NOTE — Progress Notes (Signed)
Patient presents to clinic today to establish care. He is a pleasant 61 year old male who  has a past medical history of BPH associated with nocturia; CAD in native artery; Encephalitis, viral; GERD (gastroesophageal reflux disease); Hyperlipidemia (08/07/2016); Hypertension; Ischemic cardiomyopathy; OA (osteoarthritis); Obesity (BMI 30-39.9) (08/07/2016); Renal insufficiency; Restless leg syndrome (09/16/2010); Rheumatic fever; and Syncope.  Acute Concerns:  Establish Care   Chronic Issues: Hypertension- I had recently increased lisinopril from 20 mg to 30 mg. He has not taken his medications this morning. Per cardiac rehab notes from yesterday it looks like his blood pressure has improved.   Blood Pressure (Admit) 148/82    Blood Pressure (Exercise) 158/94   Blood Pressure (Exit) 138/86    BP Readings from Last 3 Encounters:  02/01/17 (!) 184/98  01/23/17 (!) 168/80  09/19/16 128/84   NSTEMI - Oct 2017. He has finished cardiac rehab this week. He feels as though this was beneficial. He has a follow up with Cardiology next month. He denies any SOB or CP.   Health Maintenance: Dental -- Routine  Vision -- Routine  Immunizations --  UTD  Colonoscopy -- 2013 - had polyps.     Past Medical History:  Diagnosis Date  . BPH associated with nocturia   . CAD in native artery    a. NSTEMI 07/2016 -  LHC 08/07/16 showing 70% D1, 20% mLAD, segmental 95% then 75% OM stenosis, 85% acute marg, normal LVEDP - received PTCA/DES to OM.  Marland Kitchen Encephalitis, viral   . GERD (gastroesophageal reflux disease)   . Hyperlipidemia 08/07/2016  . Hypertension   . Ischemic cardiomyopathy    a. LHC 07/2016 - mild mid anterolateral focal hypocontractility felt to be due to the circumflex stenosis, EF 50%.  . OA (osteoarthritis)   . Obesity (BMI 30-39.9) 08/07/2016  . Renal insufficiency    a. 2016 r/t dehydration/ACEI use.  Marland Kitchen Restless leg syndrome 09/16/2010  . Rheumatic fever   . Syncope     Past  Surgical History:  Procedure Laterality Date  . CARDIAC CATHETERIZATION N/A 08/07/2016   Procedure: Left Heart Cath and Coronary Angiography;  Surgeon: Troy Sine, MD;  Location: [place] [facility];  Service: Cardiovascular;  Laterality: N/A;  . CARDIAC CATHETERIZATION N/A 08/07/2016   Procedure: Coronary Stent Intervention;  Surgeon: Troy Sine, MD;  Location: [place] [facility];  Service: Cardiovascular;  Laterality: N/A;  . REPLACEMENT TOTAL KNEE     left and right  . TONSILLECTOMY      Current Outpatient Prescriptions on File Prior to Visit  Medication Sig Dispense Refill  . amLODipine (NORVASC) 10 MG tablet Take 1 tablet (10 mg total) by mouth daily. 30 tablet 6  . aspirin EC 81 MG EC tablet Take 1 tablet (81 mg total) by mouth daily. 30 tablet 11  . atorvastatin (LIPITOR) 80 MG tablet Take 1 tablet (80 mg total) by mouth every evening. 30 tablet 6  . ibuprofen (ADVIL,MOTRIN) 800 MG tablet Take 1 tablet (800 mg total) by mouth every 8 (eight) hours as needed. 30 tablet 0  . Ibuprofen-Famotidine (DUEXIS) 800-26.6 MG TABS Take 1 tablet by mouth 3 (three) times daily. 90 tablet 2  . lisinopril (PRINIVIL,ZESTRIL) 30 MG tablet Take 1 tablet (30 mg total) by mouth daily. 90 tablet 1  . Multiple Vitamin (MULTI VITAMIN DAILY) TABS Take 1 tablet by mouth daily.    . nitroGLYCERIN (NITROSTAT) 0.4 MG SL tablet Place 1 tablet (0.4 mg total) under the  tongue every 5 (five) minutes as needed for chest pain. Up to 3 doses. 25 tablet 3  . oxyCODONE (OXY IR/ROXICODONE) 5 MG immediate release tablet Take 1 tablet (5 mg total) by mouth every 4 (four) hours as needed for severe pain. 40 tablet 0  . ticagrelor (BRILINTA) 90 MG TABS tablet Take 1 tablet (90 mg total) by mouth every 12 (twelve) hours. 180 tablet 3   No current facility-administered medications on file prior to visit.     Allergies  Allergen Reactions  . Penicillins Anaphylaxis    Family History  Problem Relation Age of Onset  .  Hypertension Mother   . Heart disease Mother   . Prostate cancer Father   . Dementia Father   . Depression Brother   . Hypertension Brother   . Hypertension Brother     Social History   Social History  . Marital status: Single    Spouse name: N/A  . Number of children: 3  . Years of education: N/A   Occupational History  . [employer]   Social History Main Topics  . Smoking status: Never Smoker  . Smokeless tobacco: Never Used  . Alcohol use No  . Drug use: No  . Sexual activity: Yes   Other Topics Concern  . Not on file   Social History Narrative   Lives with daughter, works for [employer] and drives trucks part-time at night    Review of Systems  Constitutional: Negative.   HENT: Negative.   Eyes: Negative.   Respiratory: Negative.   Cardiovascular: Negative.   Gastrointestinal: Negative.   Genitourinary: Negative.   Musculoskeletal: Positive for joint pain (bilateral knee pain ).  Skin: Negative.   Neurological: Negative.   Endo/Heme/Allergies: Negative.   Psychiatric/Behavioral: Negative.     BP (!) 184/98 (BP Location: Left Arm, Patient Position: Sitting, Cuff Size: Large)   Temp 97.9 F (36.6 C) (Oral)   Wt 238 lb 12.8 oz (108.3 kg)   BMI 32.39 kg/m   Physical Exam  Constitutional: He is oriented to person, place, and time and well-developed, well-nourished, and in no distress. No distress.  HENT:  Head: Normocephalic and atraumatic.  Right Ear: External ear normal.  Left Ear: External ear normal.  Nose: Nose normal.  Mouth/Throat: Uvula is midline, oropharynx is clear and moist and mucous membranes are normal. He has dentures. No oropharyngeal exudate.  Eyes: Conjunctivae and EOM are normal. Pupils are equal, round, and reactive to light. Right eye exhibits no discharge. Left eye exhibits no discharge. No scleral icterus.  Neck: Normal range of motion. Neck supple. No JVD present. No tracheal deviation present. No  thyromegaly present.  Cardiovascular: Normal rate, regular rhythm, normal heart sounds and intact distal pulses.  Exam reveals no gallop and no friction rub.   No murmur heard. Pulmonary/Chest: Effort normal and breath sounds normal. No stridor. No respiratory distress. He has no wheezes. He has no rales. He exhibits no tenderness.  Abdominal: Soft. Bowel sounds are normal. He exhibits no distension and no mass. There is no tenderness. There is no rebound and no guarding.  Musculoskeletal: Normal range of motion. He exhibits edema (trace non pitting edema in bilateral legs ). He exhibits no tenderness or deformity.  Lymphadenopathy:    He has no cervical adenopathy.  Neurological: He is alert and oriented to person, place, and time. He displays normal reflexes. No cranial nerve deficit. He exhibits normal muscle tone. Coordination normal.  GCS score is 15.  Skin: Skin is warm and dry. No rash noted. He is not diaphoretic. No erythema. No pallor.  Surgical scar on bilateral knees  Psychiatric: Mood, memory, affect and judgment normal.  Nursing note and vitals reviewed.  Assessment/Plan: 1. Encounter to establish care - Follow up for CPE - Stay active and eat a heart healthy diet   2. Essential hypertension - Needs to go home and take blood pressure medication. Please take prior to CPE next week  - Consider maxing out lisinopril and adding additional agent.   3. NSTEMI (non-ST elevated myocardial infarction) [facility]) - Follow up in May   Dorothyann Peng, NP

## 2017-02-02 NOTE — Telephone Encounter (Signed)
Faxed again

## 2017-02-06 ENCOUNTER — Encounter (INDEPENDENT_AMBULATORY_CARE_PROVIDER_SITE_OTHER): Payer: Self-pay | Admitting: Physical Medicine and Rehabilitation

## 2017-02-06 ENCOUNTER — Ambulatory Visit (INDEPENDENT_AMBULATORY_CARE_PROVIDER_SITE_OTHER): Payer: BLUE CROSS/BLUE SHIELD

## 2017-02-06 ENCOUNTER — Ambulatory Visit (INDEPENDENT_AMBULATORY_CARE_PROVIDER_SITE_OTHER): Payer: BLUE CROSS/BLUE SHIELD | Admitting: Physical Medicine and Rehabilitation

## 2017-02-06 ENCOUNTER — Ambulatory Visit (INDEPENDENT_AMBULATORY_CARE_PROVIDER_SITE_OTHER): Payer: BLUE CROSS/BLUE SHIELD | Admitting: Adult Health

## 2017-02-06 VITALS — BP 176/114 | HR 70

## 2017-02-06 VITALS — BP 138/80 | Temp 98.6°F | Wt 241.0 lb

## 2017-02-06 DIAGNOSIS — E669 Obesity, unspecified: Secondary | ICD-10-CM | POA: Diagnosis not present

## 2017-02-06 DIAGNOSIS — E7849 Other hyperlipidemia: Secondary | ICD-10-CM

## 2017-02-06 DIAGNOSIS — I10 Essential (primary) hypertension: Secondary | ICD-10-CM

## 2017-02-06 DIAGNOSIS — M5416 Radiculopathy, lumbar region: Secondary | ICD-10-CM | POA: Diagnosis not present

## 2017-02-06 DIAGNOSIS — E784 Other hyperlipidemia: Secondary | ICD-10-CM

## 2017-02-06 DIAGNOSIS — Z0000 Encounter for general adult medical examination without abnormal findings: Secondary | ICD-10-CM

## 2017-02-06 LAB — CBC WITH DIFFERENTIAL/PLATELET
BASOS PCT: 0.9 % (ref 0.0–3.0)
Basophils Absolute: 0.1 10*3/uL (ref 0.0–0.1)
EOS ABS: 0.2 10*3/uL (ref 0.0–0.7)
EOS PCT: 2.2 % (ref 0.0–5.0)
HEMATOCRIT: 38.9 % — AB (ref 39.0–52.0)
Hemoglobin: 13.2 g/dL (ref 13.0–17.0)
LYMPHS PCT: 28.5 % (ref 12.0–46.0)
Lymphs Abs: 2.9 10*3/uL (ref 0.7–4.0)
MCHC: 33.8 g/dL (ref 30.0–36.0)
MCV: 86.3 fl (ref 78.0–100.0)
MONOS PCT: 4.7 % (ref 3.0–12.0)
Monocytes Absolute: 0.5 10*3/uL (ref 0.1–1.0)
NEUTROS ABS: 6.4 10*3/uL (ref 1.4–7.7)
Neutrophils Relative %: 63.7 % (ref 43.0–77.0)
Platelets: 313 10*3/uL (ref 150.0–400.0)
RBC: 4.51 Mil/uL (ref 4.22–5.81)
RDW: 14 % (ref 11.5–15.5)
WBC: 10 10*3/uL (ref 4.0–10.5)

## 2017-02-06 LAB — BASIC METABOLIC PANEL
BUN: 9 mg/dL (ref 6–23)
CHLORIDE: 101 meq/L (ref 96–112)
CO2: 30 meq/L (ref 19–32)
CREATININE: 1.01 mg/dL (ref 0.40–1.50)
Calcium: 9.4 mg/dL (ref 8.4–10.5)
GFR: 95.76 mL/min (ref >60.00)
Glucose, Bld: 93 mg/dL (ref 70–99)
POTASSIUM: 4.4 meq/L (ref 3.5–5.1)
Sodium: 137 mEq/L (ref 135–145)

## 2017-02-06 LAB — LIPID PANEL
Cholesterol: 195 mg/dL (ref 0–200)
HDL: 36.4 mg/dL — AB (ref >39.00)
LDL Cholesterol: 133 mg/dL — ABNORMAL HIGH (ref 0–99)
NonHDL: 158.15
Total CHOL/HDL Ratio: 5
Triglycerides: 126 mg/dL (ref 0.0–149.0)
VLDL: 25.2 mg/dL (ref 0.0–40.0)

## 2017-02-06 LAB — HEPATIC FUNCTION PANEL
ALBUMIN: 4 g/dL (ref 3.5–5.2)
ALT: 9 U/L (ref 0–53)
AST: 11 U/L (ref 0–37)
Alkaline Phosphatase: 85 U/L (ref 39–117)
Bilirubin, Direct: 0.1 mg/dL (ref 0.0–0.3)
TOTAL PROTEIN: 6.9 g/dL (ref 6.0–8.3)
Total Bilirubin: 0.5 mg/dL (ref 0.2–1.2)

## 2017-02-06 LAB — TSH: TSH: 2.44 u[IU]/mL (ref 0.35–4.50)

## 2017-02-06 LAB — PSA: PSA: 2.2 ng/mL (ref 0.10–4.00)

## 2017-02-06 MED ORDER — ATORVASTATIN CALCIUM 80 MG PO TABS: 80.0000 mg | ORAL_TABLET | Freq: Every evening | ORAL | 3 refills | Status: DC

## 2017-02-06 MED ORDER — AMLODIPINE BESYLATE 10 MG PO TABS: 10.0000 mg | ORAL_TABLET | Freq: Every day | ORAL | 3 refills | Status: DC

## 2017-02-06 MED ORDER — METHYLPREDNISOLONE ACETATE 80 MG/ML IJ SUSP
80.0000 mg | Freq: Once | INTRAMUSCULAR | Status: AC
  Administered 2017-02-06: 80 mg

## 2017-02-06 MED ORDER — LIDOCAINE HCL (PF) 1 % IJ SOLN
0.3300 mL | Freq: Once | INTRAMUSCULAR | Status: AC
  Administered 2017-02-06: 0.3 mL

## 2017-02-06 NOTE — Progress Notes (Signed)
PHINNEAS SHAKOOR - 61 y.o. male MRN [id number]  Date of birth: 06/26/1955  Office Visit Note: Visit Date: 02/06/2017 PCP: Dorothyann Peng, NP Referred by: Dorothyann Peng, NP  Subjective: Chief Complaint  Patient presents with  . Lower Back - Pain   HPI: Mr. Lograsso is a 61 year old gentleman with chronic worsening Increased left side low back pain for 1 month. Comes and goes. Some referral into the hip. Pain is worse with walking.  Prior L4 transforaminal injection gave him quite a bit relief has been a while since we've seen him. He is not having as much pain down the leg as she had at that point. MRI evidence of stenosis. We will repeat the transforaminal injection today diagnostically.  Stopped Brilinta 01/30/17    ROS Otherwise per HPI.  Assessment & Plan: Visit Diagnoses:  1. Lumbar radiculopathy     Plan: Findings:  Left L4 transforaminal epidural injection with fluoroscopic guidance.    Meds & Orders:  Meds ordered this encounter  Medications  . lidocaine (PF) (XYLOCAINE) 1 % injection 0.3 mL  . methylPREDNISolone acetate (DEPO-MEDROL) injection 80 mg    Orders Placed This Encounter  Procedures  . XR C-ARM NO REPORT  . Epidural Steroid injection    Follow-up: Return if symptoms worsen or fail to improve.   Procedures: No procedures performed  Lumbosacral Transforaminal Epidural Steroid Injection - Infraneural Approach with Fluoroscopic Guidance  Patient: Jacob Quinn      Date of Birth: 06/26/1955 MRN: [id number] PCP: Dorothyann Peng, NP      Visit Date: 02/06/2017   Universal Protocol:    Date/Time: 04/12/185:28 AM  Consent Given By: the patient  Position: PRONE   Additional Comments: Vital signs were monitored before and after the procedure. Patient was prepped and draped in the usual sterile fashion. The correct patient, procedure, and site was verified.   Injection Procedure Details:  Procedure Site One Meds Administered:  Meds ordered  this encounter  Medications  . lidocaine (PF) (XYLOCAINE) 1 % injection 0.3 mL  . methylPREDNISolone acetate (DEPO-MEDROL) injection 80 mg      Laterality: Left  Location/Site:  L3-L4 L4-L5  Needle size: 22 G  Needle type: Spinal  Needle Placement: Transforaminal  Findings:  -Contrast Used: 1 mL iohexol 180 mg iodine/mL   -Comments: Excellent flow of contrast along the nerve and into the epidural space.  Procedure Details: After squaring off the end-plates of the desired vertebral level to get a true AP view, the C-arm was obliqued to the painful side so that the superior articulating process is positioned about 1/3 the length of the inferior endplate.  The needle was aimed toward the junction of the superior articular process and the transverse process of the inferior vertebrae. The needle's initial entry is in the lower third of the foramen through Kambin's triangle. The soft tissues overlying this target were infiltrated with 2-3 ml. of 1% Lidocaine without Epinephrine.  The spinal needle was then inserted and advanced toward the target using a "trajectory" view along the fluoroscope beam.  Under AP and lateral visualization, the needle was advanced so it did not puncture dura and did not traverse medially beyond the 6 o'clock position of the pedicle. Bi-planar projections were used to confirm position. Aspiration was confirmed to be negative for CSF and/or blood. A 1-2 ml. volume of Isovue-250 was injected and flow of contrast was noted at each level. Radiographs were obtained for documentation purposes.   After attaining the desired flow  of contrast documented above, a 0.5 to 1.0 ml test dose of 0.25% Marcaine was injected into each respective transforaminal space.  The patient was observed for 90 seconds post injection.  After no sensory deficits were reported, and normal lower extremity motor function was noted,   the above injectate was administered so that equal amounts of the  injectate were placed at each foramen (level) into the transforaminal epidural space.   Additional Comments:  The patient tolerated the procedure well Dressing: Band-Aid    Post-procedure details: Patient was observed during the procedure. Post-procedure instructions were reviewed.  Patient left the clinic in stable condition.   Clinical History: No specialty comments available.  He reports that he has never smoked. He has never used smokeless tobacco. No results for input(s): HGBA1C, LABURIC in the last 8760 hours.  Objective:  VS:  HT:    WT:   BMI:     BP:(!) 176/114  HR:70bpm  TEMP: ( )  RESP:95 % Physical Exam  Musculoskeletal:  Patient ambulates with forward flexed spine has good distal strength.    Ortho Exam Imaging: No results found.  Past Medical/Family/Surgical/Social History: Medications & Allergies reviewed per EMR Patient Active Problem List   Diagnosis Date Noted  . Presence of left artificial knee joint 12/20/2016  . Presence of right artificial knee joint 12/20/2016  . Essential hypertension 08/08/2016  . Cardiomyopathy, ischemic 08/08/2016  . NSTEMI (non-ST elevated myocardial infarction) ([facility]) 08/07/2016  . Obesity (BMI 30-39.9) 08/07/2016  . Hyperlipidemia 08/07/2016  . CAD (coronary artery disease), native coronary artery 08/07/2016  . Restless leg syndrome 09/16/2010  . Hypertensive heart disease without CHF 11/22/2007  . Osteoarthritis 11/22/2007   Past Medical History:  Diagnosis Date  . BPH associated with nocturia   . CAD in native artery    a. NSTEMI 07/2016 -  LHC 08/07/16 showing 70% D1, 20% mLAD, segmental 95% then 75% OM stenosis, 85% acute marg, normal LVEDP - received PTCA/DES to OM.  Marland Kitchen Encephalitis, viral   . Hyperlipidemia 08/07/2016  . Hypertension   . Ischemic cardiomyopathy    a. LHC 07/2016 - mild mid anterolateral focal hypocontractility felt to be due to the circumflex stenosis, EF 50%.  . OA (osteoarthritis)   .  Obesity (BMI 30-39.9) 08/07/2016  . Renal insufficiency    a. 2016 r/t dehydration/ACEI use.  Marland Kitchen Restless leg syndrome 09/16/2010  . Rheumatic fever   . Syncope    Family History  Problem Relation Age of Onset  . Hypertension Mother   . Heart disease Mother   . Prostate cancer Father   . Dementia Father   . Depression Brother   . Hypertension Brother   . Hypertension Brother   . Stroke Brother    Past Surgical History:  Procedure Laterality Date  . CARDIAC CATHETERIZATION N/A 08/07/2016   Procedure: Left Heart Cath and Coronary Angiography;  Surgeon: Troy Sine, MD;  Location: [facility];  Service: Cardiovascular;  Laterality: N/A;  . CARDIAC CATHETERIZATION N/A 08/07/2016   Procedure: Coronary Stent Intervention;  Surgeon: Troy Sine, MD;  Location: [facility];  Service: Cardiovascular;  Laterality: N/A;  . REPLACEMENT TOTAL KNEE     left and right-  5 total   . TONSILLECTOMY     Social History   Occupational History  . [employer]   Social History Main Topics  . Smoking status: Never Smoker  . Smokeless tobacco: Never Used  .  Alcohol use No  . Drug use: No  . Sexual activity: Yes

## 2017-02-06 NOTE — Patient Instructions (Signed)

## 2017-02-06 NOTE — Telephone Encounter (Signed)
Patient is here today for appt and said Dr. Claiborne Billings said ok to stop taking. D/c'd on 01/30/17.

## 2017-02-06 NOTE — Progress Notes (Addendum)
Subjective:    Patient ID: Jacob Quinn, male    DOB: 06/26/1955, 61 y.o.   MRN: [id number]  HPI  Patient presents for yearly preventative medicine examination. He is a pleasant 61 year old male who  has a past medical history of BPH associated with nocturia; CAD in native artery; Encephalitis, viral; Hyperlipidemia (08/07/2016); Hypertension; Ischemic cardiomyopathy; OA (osteoarthritis); Obesity (BMI 30-39.9) (08/07/2016); Renal insufficiency; Restless leg syndrome (09/16/2010); Rheumatic fever; and Syncope.  All immunizations and health maintenance protocols were reviewed with the patient and needed orders were placed.  Appropriate screening laboratory values were ordered for the patient including screening of hyperlipidemia, renal function and hepatic function. If indicated by BPH, a PSA was ordered.   Medication reconciliation,  past medical history, social history, problem list and allergies were reviewed in detail with the patient  Goals were established with regard to weight loss, exercise, and  diet in compliance with medications. He is trying to eat healthy and stay active but his bilateral knee pain often keeps him from exercising.    He is up to date on his colonoscopy, dental and vision exams.   He has finished cardiac rehab and is seeing cardiology next month s/p NSTEMI  Overall , he states " I feel good except for this knee pain."   Review of Systems  Constitutional: Negative.   HENT: Negative.   Eyes: Negative.   Respiratory: Negative.   Cardiovascular: Negative.   Gastrointestinal: Negative.   Endocrine: Negative.   Genitourinary: Negative.   Musculoskeletal: Positive for arthralgias (bilateral knee ) and gait problem.  Skin: Negative.   Allergic/Immunologic: Negative.   Hematological: Negative.   Psychiatric/Behavioral: Negative.   All other systems reviewed and are negative.  Past Medical History:  Diagnosis Date  . BPH associated with nocturia   .  CAD in native artery    a. NSTEMI 07/2016 -  LHC 08/07/16 showing 70% D1, 20% mLAD, segmental 95% then 75% OM stenosis, 85% acute marg, normal LVEDP - received PTCA/DES to OM.  Marland Kitchen Encephalitis, viral   . Hyperlipidemia 08/07/2016  . Hypertension   . Ischemic cardiomyopathy    a. LHC 07/2016 - mild mid anterolateral focal hypocontractility felt to be due to the circumflex stenosis, EF 50%.  . OA (osteoarthritis)   . Obesity (BMI 30-39.9) 08/07/2016  . Renal insufficiency    a. 2016 r/t dehydration/ACEI use.  Marland Kitchen Restless leg syndrome 09/16/2010  . Rheumatic fever   . Syncope     Social History   Social History  . Marital status: Single    Spouse name: N/A  . Number of children: 3  . Years of education: N/A   Occupational History  . [employer]   Social History Main Topics  . Smoking status: Never Smoker  . Smokeless tobacco: Never Used  . Alcohol use No  . Drug use: No  . Sexual activity: Yes   Other Topics Concern  . Not on file   Social History Narrative   Lives with daughter   Is retired -     Past Surgical History:  Procedure Laterality Date  . CARDIAC CATHETERIZATION N/A 08/07/2016   Procedure: Left Heart Cath and Coronary Angiography;  Surgeon: Troy Sine, MD;  Location: [facility];  Service: Cardiovascular;  Laterality: N/A;  . CARDIAC CATHETERIZATION N/A 08/07/2016   Procedure: Coronary Stent Intervention;  Surgeon: Troy Sine, MD;  Location: [facility];  Service: Cardiovascular;  Laterality: N/A;  . REPLACEMENT TOTAL KNEE     left and right-  5 total   . TONSILLECTOMY      Family History  Problem Relation Age of Onset  . Hypertension Mother   . Heart disease Mother   . Prostate cancer Father   . Dementia Father   . Depression Brother   . Hypertension Brother   . Hypertension Brother   . Stroke Brother     Allergies  Allergen Reactions  . Penicillins Anaphylaxis    Current Outpatient  Prescriptions on File Prior to Visit  Medication Sig Dispense Refill  . aspirin EC 81 MG EC tablet Take 1 tablet (81 mg total) by mouth daily. 30 tablet 11  . ibuprofen (ADVIL,MOTRIN) 800 MG tablet Take 1 tablet (800 mg total) by mouth every 8 (eight) hours as needed. 30 tablet 0  . Ibuprofen-Famotidine (DUEXIS) 800-26.6 MG TABS Take 1 tablet by mouth 3 (three) times daily. 90 tablet 2  . lisinopril (PRINIVIL,ZESTRIL) 30 MG tablet Take 1 tablet (30 mg total) by mouth daily. 90 tablet 1  . Multiple Vitamin (MULTI VITAMIN DAILY) TABS Take 1 tablet by mouth daily.    . nitroGLYCERIN (NITROSTAT) 0.4 MG SL tablet Place 1 tablet (0.4 mg total) under the tongue every 5 (five) minutes as needed for chest pain. Up to 3 doses. 25 tablet 3  . oxyCODONE (OXY IR/ROXICODONE) 5 MG immediate release tablet Take 1 tablet (5 mg total) by mouth every 4 (four) hours as needed for severe pain. 40 tablet 0  . ticagrelor (BRILINTA) 90 MG TABS tablet Take 1 tablet (90 mg total) by mouth every 12 (twelve) hours. 180 tablet 3   No current facility-administered medications on file prior to visit.     BP 138/80 (BP Location: Left Arm, Patient Position: Sitting)   Temp 98.6 F (37 C) (Oral)   Wt 241 lb (109.3 kg)   BMI 32.69 kg/m       Objective:   Physical Exam  Constitutional: He is oriented to person, place, and time. He appears well-developed and well-nourished. No distress.  HENT:  Head: Normocephalic and atraumatic.  Right Ear: External ear normal.  Left Ear: External ear normal.  Nose: Nose normal.  Mouth/Throat: Oropharynx is clear and moist. No oropharyngeal exudate.  Eyes: Conjunctivae and EOM are normal. Pupils are equal, round, and reactive to light. Right eye exhibits no discharge. Left eye exhibits no discharge. No scleral icterus.  Neck: Normal range of motion. Neck supple. No JVD present. Carotid bruit is not present. No tracheal deviation present. No thyromegaly present.  Cardiovascular: Normal  rate, regular rhythm, normal heart sounds and intact distal pulses.  Exam reveals no gallop and no friction rub.   No murmur heard. Pulmonary/Chest: Effort normal and breath sounds normal. No stridor. No respiratory distress. He has no wheezes. He has no rales. He exhibits no tenderness.  Abdominal: Soft. Bowel sounds are normal. He exhibits no distension and no mass. There is no tenderness. There is no rebound and no guarding.  Genitourinary: Rectum normal and prostate normal. Rectal exam shows guaiac negative stool.  Musculoskeletal: Normal range of motion. He exhibits no edema, tenderness or deformity.  Lymphadenopathy:    He has no cervical adenopathy.  Neurological: He is alert and oriented to person, place, and time. He has normal reflexes. He displays normal reflexes. No cranial nerve deficit. He exhibits normal muscle tone. Coordination normal.  Skin: Skin is warm and dry. No rash  noted. He is not diaphoretic. No erythema. No pallor.  Psychiatric: He has a normal mood and affect. His behavior is normal. Judgment and thought content normal.  Nursing note and vitals reviewed.     Assessment & Plan:  1. Routine general medical examination at a health care facility  - Basic metabolic panel - CBC with Differential/Platelet - Hepatic function panel - Lipid panel - PSA - TSH - Encouraged upper body exercises and a heart healthy diet.  - Follow up in one year or sooner if needed  2. Essential hypertension - Better controlled today  - Basic metabolic panel - CBC with Differential/Platelet - Hepatic function panel - Lipid panel - PSA - TSH - amLODipine (NORVASC) 10 MG tablet; Take 1 tablet (10 mg total) by mouth daily.  Dispense: 90 tablet; Refill: 3 - Continue with current therapy.  3. Other hyperlipidemia  - Basic metabolic panel - CBC with Differential/Platelet - Hepatic function panel - Lipid panel - PSA - TSH  4. Obesity (BMI 30-39.9)  - Basic metabolic panel - CBC  with Differential/Platelet - Hepatic function panel - Lipid panel - PSA - TSH  Dorothyann Peng, NP

## 2017-02-08 NOTE — Procedures (Signed)
Lumbosacral Transforaminal Epidural Steroid Injection - Infraneural Approach with Fluoroscopic Guidance  Patient: Jacob Quinn      Date of Birth: 06/26/1955 MRN: [id number] PCP: Dorothyann Peng, NP      Visit Date: 02/06/2017   Universal Protocol:    Date/Time: 04/12/185:28 AM  Consent Given By: the patient  Position: PRONE   Additional Comments: Vital signs were monitored before and after the procedure. Patient was prepped and draped in the usual sterile fashion. The correct patient, procedure, and site was verified.   Injection Procedure Details:  Procedure Site One Meds Administered:  Meds ordered this encounter  Medications  . lidocaine (PF) (XYLOCAINE) 1 % injection 0.3 mL  . methylPREDNISolone acetate (DEPO-MEDROL) injection 80 mg      Laterality: Left  Location/Site:  L3-L4 L4-L5  Needle size: 22 G  Needle type: Spinal  Needle Placement: Transforaminal  Findings:  -Contrast Used: 1 mL iohexol 180 mg iodine/mL   -Comments: Excellent flow of contrast along the nerve and into the epidural space.  Procedure Details: After squaring off the end-plates of the desired vertebral level to get a true AP view, the C-arm was obliqued to the painful side so that the superior articulating process is positioned about 1/3 the length of the inferior endplate.  The needle was aimed toward the junction of the superior articular process and the transverse process of the inferior vertebrae. The needle's initial entry is in the lower third of the foramen through Kambin's triangle. The soft tissues overlying this target were infiltrated with 2-3 ml. of 1% Lidocaine without Epinephrine.  The spinal needle was then inserted and advanced toward the target using a "trajectory" view along the fluoroscope beam.  Under AP and lateral visualization, the needle was advanced so it did not puncture dura and did not traverse medially beyond the 6 o'clock position of the pedicle. Bi-planar  projections were used to confirm position. Aspiration was confirmed to be negative for CSF and/or blood. A 1-2 ml. volume of Isovue-250 was injected and flow of contrast was noted at each level. Radiographs were obtained for documentation purposes.   After attaining the desired flow of contrast documented above, a 0.5 to 1.0 ml test dose of 0.25% Marcaine was injected into each respective transforaminal space.  The patient was observed for 90 seconds post injection.  After no sensory deficits were reported, and normal lower extremity motor function was noted,   the above injectate was administered so that equal amounts of the injectate were placed at each foramen (level) into the transforaminal epidural space.   Additional Comments:  The patient tolerated the procedure well Dressing: Band-Aid    Post-procedure details: Patient was observed during the procedure. Post-procedure instructions were reviewed.  Patient left the clinic in stable condition.

## 2017-02-13 ENCOUNTER — Telehealth: Payer: Self-pay | Admitting: *Deleted

## 2017-02-13 NOTE — Telephone Encounter (Signed)
Faxed surgical clearance to [facility] okay to hold brilinta.

## 2017-02-21 ENCOUNTER — Ambulatory Visit: Payer: BLUE CROSS/BLUE SHIELD | Admitting: Cardiovascular Disease

## 2017-02-23 ENCOUNTER — Encounter: Payer: Self-pay | Admitting: Cardiovascular Disease

## 2017-02-23 ENCOUNTER — Ambulatory Visit (INDEPENDENT_AMBULATORY_CARE_PROVIDER_SITE_OTHER): Payer: BLUE CROSS/BLUE SHIELD | Admitting: Cardiovascular Disease

## 2017-02-23 VITALS — BP 184/98 | HR 58 | Ht 73.0 in | Wt 238.2 lb

## 2017-02-23 DIAGNOSIS — I119 Hypertensive heart disease without heart failure: Secondary | ICD-10-CM | POA: Diagnosis not present

## 2017-02-23 DIAGNOSIS — I2510 Atherosclerotic heart disease of native coronary artery without angina pectoris: Secondary | ICD-10-CM

## 2017-02-23 DIAGNOSIS — I255 Ischemic cardiomyopathy: Secondary | ICD-10-CM

## 2017-02-23 DIAGNOSIS — E785 Hyperlipidemia, unspecified: Secondary | ICD-10-CM

## 2017-02-23 DIAGNOSIS — I10 Essential (primary) hypertension: Secondary | ICD-10-CM

## 2017-02-23 DIAGNOSIS — Z79899 Other long term (current) drug therapy: Secondary | ICD-10-CM | POA: Diagnosis not present

## 2017-02-23 DIAGNOSIS — E669 Obesity, unspecified: Secondary | ICD-10-CM | POA: Diagnosis not present

## 2017-02-23 MED ORDER — SPIRONOLACTONE 25 MG PO TABS: 12.5000 mg | ORAL_TABLET | Freq: Two times a day (BID) | ORAL | 3 refills | Status: DC

## 2017-02-23 MED ORDER — LISINOPRIL 40 MG PO TABS: 40.0000 mg | ORAL_TABLET | Freq: Every day | ORAL | 3 refills | Status: DC

## 2017-02-23 MED ORDER — EZETIMIBE 10 MG PO TABS: 10.0000 mg | ORAL_TABLET | Freq: Every day | ORAL | 3 refills | Status: DC

## 2017-02-23 NOTE — Progress Notes (Signed)
Cardiology Office Note    Date:  02/24/2017   ID:  Yates, Weisgerber 06/26/1955, MRN [id number]  PCP:  Dorothyann Peng, NP  Cardiologist:  Shelva Majestic, MD   Initial office visit with me following his 08/07/2016 NSTEMI  History of Present Illness:  Jacob Quinn is a 61 y.o. male who was admitted in the early morning 08/07/2016 with unstable angina.  He previously had experienced recurrent episodes of chest pressure and prior to his admission to the hospital was told of having reflux.  He was brought to the cardiac catheterization laboratory and catheterization by me revealed three-vessel CAD with 70% ostial stenosis in the first diagonal branch of the LAD with 20% mid LAD stenoses, segmental 95 and 70% circumflex marginal stenoses, and 85% stenosis in a small anterior RV marginal branch of the RCA.  He underwent successful PCI to the circumflex marginal vessel with ultimate insertion of a 3.022 mm Resolute DES stent postdilated with stent taper from 325-3.05 and with the segmental 95 and 75% stenoses being reduced to 0%.  He was seen by Rosaria Ferries 08/24/2016 for post hospital follow-up.  At that time was stable on aspirin/Brilinta for dual antiplatelet therapy.  His blood pressure was elevated and he was on amlodipine 10 mg, lisinopril 20 mg in addition to atorvastatin 80 mg.  Some reason, he never presented for follow-up to see me for subsequent office visit.  Presently, he denies any chest pain.  He is retired and previously had worked at [employer].  He denies PND, orthopnea.  He had undergone laboratory on 02/06/2017 which showed a total cholesterol 195, LDL cholesterol 133, HDL 36, triglycerides 126.  He had normal renal function with a potassium of 4.4, and creatinine 1.01.  Thyroid function studies were normal.  He presents for evaluation    Past Medical History:  Diagnosis Date  . BPH associated with nocturia   . CAD in native artery    a. NSTEMI 07/2016 -  LHC 08/07/16  showing 70% D1, 20% mLAD, segmental 95% then 75% OM stenosis, 85% acute marg, normal LVEDP - received PTCA/DES to OM.  Marland Kitchen Encephalitis, viral   . Hyperlipidemia 08/07/2016  . Hypertension   . Ischemic cardiomyopathy    a. LHC 07/2016 - mild mid anterolateral focal hypocontractility felt to be due to the circumflex stenosis, EF 50%.  . OA (osteoarthritis)   . Obesity (BMI 30-39.9) 08/07/2016  . Renal insufficiency    a. 2016 r/t dehydration/ACEI use.  Marland Kitchen Restless leg syndrome 09/16/2010  . Rheumatic fever   . Syncope     Past Surgical History:  Procedure Laterality Date  . CARDIAC CATHETERIZATION N/A 08/07/2016   Procedure: Left Heart Cath and Coronary Angiography;  Surgeon: Troy Sine, MD;  Location: [facility];  Service: Cardiovascular;  Laterality: N/A;  . CARDIAC CATHETERIZATION N/A 08/07/2016   Procedure: Coronary Stent Intervention;  Surgeon: Troy Sine, MD;  Location: [facility];  Service: Cardiovascular;  Laterality: N/A;  . REPLACEMENT TOTAL KNEE     left and right-  5 total   . TONSILLECTOMY      Current Medications: Outpatient Medications Prior to Visit  Medication Sig Dispense Refill  . amLODipine (NORVASC) 10 MG tablet Take 1 tablet (10 mg total) by mouth daily. 90 tablet 3  . aspirin EC 81 MG EC tablet Take 1 tablet (81 mg total) by mouth daily. 30 tablet 11  . atorvastatin (LIPITOR) 80 MG tablet Take 1  tablet (80 mg total) by mouth every evening. 90 tablet 3  . ibuprofen (ADVIL,MOTRIN) 800 MG tablet Take 1 tablet (800 mg total) by mouth every 8 (eight) hours as needed. 30 tablet 0  . Ibuprofen-Famotidine (DUEXIS) 800-26.6 MG TABS Take 1 tablet by mouth 3 (three) times daily. 90 tablet 2  . Multiple Vitamin (MULTI VITAMIN DAILY) TABS Take 1 tablet by mouth daily.    . nitroGLYCERIN (NITROSTAT) 0.4 MG SL tablet Place 1 tablet (0.4 mg total) under the tongue every 5 (five) minutes as needed for chest pain. Up to 3 doses. 25 tablet 3  . oxyCODONE (OXY  IR/ROXICODONE) 5 MG immediate release tablet Take 1 tablet (5 mg total) by mouth every 4 (four) hours as needed for severe pain. 40 tablet 0  . ticagrelor (BRILINTA) 90 MG TABS tablet Take 1 tablet (90 mg total) by mouth every 12 (twelve) hours. 180 tablet 3  . lisinopril (PRINIVIL,ZESTRIL) 30 MG tablet Take 1 tablet (30 mg total) by mouth daily. 90 tablet 1   No facility-administered medications prior to visit.      Allergies:   Penicillins   Social History   Social History  . Marital status: Single    Spouse name: N/A  . Number of children: 3  . Years of education: N/A   Occupational History  . [employer]   Social History Main Topics  . Smoking status: Never Smoker  . Smokeless tobacco: Never Used  . Alcohol use No  . Drug use: No  . Sexual activity: Yes   Other Topics Concern  . None   Social History Narrative   Lives with daughter   Is retired -      Family History:  The patient's family history includes Dementia in his father; Depression in his brother; Heart disease in his mother; Hypertension in his brother, brother, and mother; Prostate cancer in his father; Stroke in his brother.   ROS General: Negative; No fevers, chills, or night sweats;  HEENT: Negative; No changes in vision or hearing, sinus congestion, difficulty swallowing Pulmonary: Negative; No cough, wheezing, shortness of breath, hemoptysis Cardiovascular:  See HPI GI: Negative; No nausea, vomiting, diarrhea, or abdominal pain GU: Negative; No dysuria, hematuria, or difficulty voiding Musculoskeletal: Negative; no myalgias, joint pain, or weakness Hematologic/Oncology: Negative; no easy bruising, bleeding Endocrine: Negative; no heat/cold intolerance; no diabetes Neuro: Negative; no changes in balance, headaches Skin: Negative; No rashes or skin lesions Psychiatric: Negative; No behavioral problems, depression Sleep: Negative; No snoring, daytime sleepiness,  hypersomnolence, bruxism, restless legs, hypnogognic hallucinations, no cataplexy Other comprehensive 14 point system review is negative.   PHYSICAL EXAM:   VS:  BP (!) 184/98   Pulse (!) 58   Ht '6\' 1"'  (1.854 m)   Wt 238 lb 3.2 oz (108 kg)   BMI 31.43 kg/m     Repeat blood pressure by me 180/90  Wt Readings from Last 3 Encounters:  02/23/17 238 lb 3.2 oz (108 kg)  02/06/17 241 lb (109.3 kg)  02/01/17 238 lb 12.8 oz (108.3 kg)    General: Alert, oriented, no distress.  Skin: normal turgor, no rashes, warm and dry HEENT: Normocephalic, atraumatic. Pupils equal round and reactive to light; sclera anicteric; extraocular muscles intact; Fundi Mild arteriolar narrowing.  No hemorrhages Nose without nasal septal hypertrophy Mouth/Parynx benign; Mallinpatti scale 3 Neck: No JVD, no carotid bruits; normal carotid upstroke Lungs: clear to ausculatation and percussion; no wheezing or rales  Chest wall: without tenderness to palpitation Heart: PMI not displaced, RRR, s1 s2 normal, 1/6 systolic murmur, no diastolic murmur, no rubs, gallops, thrills, or heaves Abdomen: soft, nontender; no hepatosplenomehaly, BS+; abdominal aorta nontender and not dilated by palpation. Back: no CVA tenderness Pulses 2+ Musculoskeletal: full range of motion, normal strength, no joint deformities Extremities: no clubbing cyanosis or edema, Homan's sign negative  Neurologic: grossly nonfocal; Cranial nerves grossly wnl Psychologic: Normal mood and affect   Studies/Labs Reviewed:   EKG:  EKG is ordered today. ECG (independently read by me): Sinus bradycardia 58 bpm.  First degree AV block with PR interval 250 ms.  Borderline voltage criteria for LVH.  No ST segment changes.  QTc interval 370 ms.  Recent Labs: BMP Latest Ref Rng & Units 02/06/2017 08/08/2016 08/07/2016  Glucose 70 - 99 mg/dL 93 87 91  BUN 6 - 23 mg/dL '9 10 10  ' Creatinine 0.40 - 1.50 mg/dL 1.01 1.05 1.13  Sodium 135 - 145 mEq/L 137 136 138    Potassium 3.5 - 5.1 mEq/L 4.4 3.9 3.8  Chloride 96 - 112 mEq/L 101 103 99(L)  CO2 19 - 32 mEq/L '30 25 29  ' Calcium 8.4 - 10.5 mg/dL 9.4 8.8(L) 9.6     Hepatic Function Latest Ref Rng & Units 02/06/2017 08/07/2016 05/13/2015  Total Protein 6.0 - 8.3 g/dL 6.9 7.1 7.3  Albumin 3.5 - 5.2 g/dL 4.0 3.8 4.1  AST 0 - 37 U/L '11 19 14  ' ALT 0 - 53 U/L 9 14(L) 9  Alk Phosphatase 39 - 117 U/L 85 73 74  Total Bilirubin 0.2 - 1.2 mg/dL 0.5 1.2 0.5  Bilirubin, Direct 0.0 - 0.3 mg/dL 0.1 - 0.1    CBC Latest Ref Rng & Units 02/06/2017 08/08/2016 08/07/2016  WBC 4.0 - 10.5 K/uL 10.0 9.4 10.1  Hemoglobin 13.0 - 17.0 g/dL 13.2 12.2(L) 14.2  Hematocrit 39.0 - 52.0 % 38.9(L) 37.8(L) 43.4  Platelets 150.0 - 400.0 K/uL 313.0 254 294   Lab Results  Component Value Date   MCV 86.3 02/06/2017   MCV 89.2 08/08/2016   MCV 88.9 08/07/2016   Lab Results  Component Value Date   TSH 2.44 02/06/2017   Lab Results  Component Value Date   HGBA1C 5.6 01/30/2015     BNP No results found for: BNP  ProBNP No results found for: PROBNP   Lipid Panel     Component Value Date/Time   CHOL 195 02/06/2017 0808   TRIG 126.0 02/06/2017 0808   HDL 36.40 (L) 02/06/2017 0808   CHOLHDL 5 02/06/2017 0808   VLDL 25.2 02/06/2017 0808   LDLCALC 133 (H) 02/06/2017 0808   LDLDIRECT 110.4 02/19/2007 0819     RADIOLOGY: Xr C-arm No Report  Result Date: 02/06/2017 Please see Notes or Procedures tab for imaging impression.    Additional studies/ records that were reviewed today include:  I reviewed the patient's October 2017 hospitalization, catheterization report, subsequent office visit with Rosaria Ferries and recent laboratory.    ASSESSMENT:    1. Coronary artery disease involving native coronary artery of native heart without angina pectoris   2. Essential hypertension   3. Hypertensive heart disease without CHF   4. Cardiomyopathy, ischemic   5. Hyperlipidemia with target LDL less than 70   6.  Medication management   7. Mild obesity      PLAN:  Jacob Quinn is a 61 year old Afro-American male who presented with unstable angina and ruled in for non-ST segment elevation  myocardial infarction on 08/07/2016.  He was found to have multivessel CAD as noted above and underwent successful stenting to his high grade segmental circumflex marginal stenoses.  He has been without anginal symptomatology since his intervention.  He has been maintained on amlodipine 10 mg and lisinopril most recently 30 mg for hypertension.  On exam today he has stage II hypertension with initial blood pressure 184/98 when taken by the nurse.  He has normal renal function.  I am titrating lisinopril to 40 mg and will also add spironolactone 12.5 mg twice a day to his medical regimen.  If he continues to have significant hyperlipidemia despite taking atorvastatin 80 mg with target LDL less than 70  I will add Zetia 10 mg to his medical regimen.  We discussed improved diet.  He will have a follow-up be met in 2 weeks to reassess renal fxn and K  with the addition of spironolactone.  In  6-8 weeks we will repeat chemistry profile, lipid studies.  If he cannot reach target despite maximum dose atorvastatin and Zetia, he is a candidate for PCSK9 inhibition.  We discussed improved diet and sodium restriction.  BMI is 31.4.  Weight reduction was recommended.  I will see him in 2-3 months for reevaluation.   Medication Adjustments/Labs and Tests Ordered: Current medicines are reviewed at length with the patient today.  Concerns regarding medicines are outlined above.  Medication changes, Labs and Tests ordered today are listed in the Patient Instructions below. Patient Instructions  Medication Instructions:   1.) the lisinopril has been increased to 40 mg daily from 20 mg.  2.) start new prescriptions given today for ezetimibe and spironolactone. These prescriptions has already been sent to your pharmacy.     Labwork:  B-met in 2 weeks.  C-MET and LIPID in 6 weeks.  Testing/Procedures:  NONE ORDERED TODAY  Follow-Up:  8 WEEKS with Dr Claiborne Billings  Any Other Special Instructions Will Be Listed Below (If Applicable).      Signed, Shelva Majestic, MD  02/24/2017 10:46 AM    [facility] [address] [place], [place], [place]  [address] Phone: [phone]

## 2017-02-23 NOTE — Patient Instructions (Signed)
Medication Instructions:   1.) the lisinopril has been increased to 40 mg daily from 20 mg.  2.) start new prescriptions given today for ezetimibe and spironolactone. These prescriptions has already been sent to your pharmacy.    Labwork:  B-met in 2 weeks.  C-MET and LIPID in 6 weeks.  Testing/Procedures:  NONE ORDERED TODAY  Follow-Up:  8 WEEKS with Dr Claiborne Billings  Any Other Special Instructions Will Be Listed Below (If Applicable).

## 2017-02-25 ENCOUNTER — Encounter (HOSPITAL_COMMUNITY): Payer: Self-pay | Admitting: *Deleted

## 2017-03-14 ENCOUNTER — Ambulatory Visit (INDEPENDENT_AMBULATORY_CARE_PROVIDER_SITE_OTHER): Payer: BLUE CROSS/BLUE SHIELD | Admitting: Orthopedic Surgery

## 2017-03-14 ENCOUNTER — Encounter (INDEPENDENT_AMBULATORY_CARE_PROVIDER_SITE_OTHER): Payer: Self-pay | Admitting: Orthopedic Surgery

## 2017-03-14 DIAGNOSIS — G8929 Other chronic pain: Secondary | ICD-10-CM

## 2017-03-14 DIAGNOSIS — M25562 Pain in left knee: Secondary | ICD-10-CM | POA: Diagnosis not present

## 2017-03-14 DIAGNOSIS — M25561 Pain in right knee: Secondary | ICD-10-CM

## 2017-03-14 LAB — CBC WITH DIFFERENTIAL/PLATELET
Basophils Absolute: 80 cells/uL (ref 0–200)
Basophils Relative: 1 %
EOS ABS: 240 {cells}/uL (ref 15–500)
EOS PCT: 3 %
HEMATOCRIT: 38.4 % — AB (ref 38.5–50.0)
HEMOGLOBIN: 12.8 g/dL — AB (ref 13.2–17.1)
LYMPHS ABS: 2160 {cells}/uL (ref 850–3900)
Lymphocytes Relative: 27 %
MCH: 28.8 pg (ref 27.0–33.0)
MCHC: 33.3 g/dL (ref 32.0–36.0)
MCV: 86.3 fL (ref 80.0–100.0)
MONO ABS: 480 {cells}/uL (ref 200–950)
MPV: 9.6 fL (ref 7.5–12.5)
Monocytes Relative: 6 %
NEUTROS PCT: 63 %
Neutro Abs: 5040 cells/uL (ref 1500–7800)
Platelets: 288 10*3/uL (ref 140–400)
RBC: 4.45 MIL/uL (ref 4.20–5.80)
RDW: 14.9 % (ref 11.0–15.0)
WBC: 8 10*3/uL (ref 3.8–10.8)

## 2017-03-14 MED ORDER — IBUPROFEN 800 MG PO TABS: 800.0000 mg | ORAL_TABLET | Freq: Three times a day (TID) | ORAL | 0 refills | Status: DC | PRN

## 2017-03-15 LAB — SEDIMENTATION RATE: SED RATE: 4 mm/h (ref 0–20)

## 2017-03-15 LAB — C-REACTIVE PROTEIN: CRP: 0.7 mg/L (ref <8.0)

## 2017-03-15 NOTE — Progress Notes (Signed)
Please call patient with results. Thanks labs again no evidence of infection thanks

## 2017-03-17 NOTE — Progress Notes (Signed)
Office Visit Note   Patient: Jacob Quinn           Date of Birth: 08/16/1956           MRN: [id number] Visit Date: 03/14/2017 Requested by: Dorena Cookey, MD [place], [place] [address] PCP: Dorothyann Peng, NP  Subjective: Chief Complaint  Patient presents with  . Right Knee - Follow-up  . Left Knee - Follow-up    HPI: Jacob Quinn is a 61 year old patient with left knee pain.  He's had a left total knee replacement placed and revised.  There is evidence of loosening and subsidence of the tibial component.  This is been present for several years.  Patient is been encouraged to have it revised but has delayed revision until now.  He recently stopped work and is on disability.  He has issues with his back as well.  He denies any fever or chills.  Recent blood work done at the time of this dictation is negative for infection.  Patient also has partial knee replacement on the right-hand side which is 63 years old.  This will need to be addressed at some time in the future but for now the left knee is giving him more problems.              ROS: All systems reviewed are negative as they relate to the chief complaint within the history of present illness.  Patient denies  fevers or chills.   Assessment & Plan: Visit Diagnoses:  1. Chronic pain of both knees     Plan: Impression is left loose total knee prosthesis.  Tibial component directly has subsided.  Be a difficult revision in terms of bone stock and maintenance of the extensor mechanism attachment.  Femoral component may or may not be loose.  Plan at this time is for left tibial revision.  We may need to change into a hinge knee.  Difficult to say whether or not the femoral component would need to be revised.  Risks and benefits of the procedure were discussed including not limited to infection or vessel damage knee stiffness complete restoration of functional range of motion.  Patient understands the risks and  benefits and wishes to proceed.  All questions answered.  We will need to have a prosthetic components available that can restore joint line to a more proximal level.  Patient did have stent placed in October.  He will need cardiology consult and risk stratification prior to surgery.  He's had no recent chest pain since the stent was placed.  Follow-Up Instructions: No Follow-up on file.   Orders:  Orders Placed This Encounter  Procedures  . CBC with Differential  . Sed Rate (ESR)  . C-reactive protein   Meds ordered this encounter  Medications  . ibuprofen (ADVIL,MOTRIN) 800 MG tablet    Sig: Take 1 tablet (800 mg total) by mouth every 8 (eight) hours as needed.    Dispense:  30 tablet    Refill:  0      Procedures: No procedures performed   Clinical Data: No additional findings.  Objective: Vital Signs: There were no vitals taken for this visit.  Physical Exam: All systems reviewed are negative as they relate to the chief complaint within the history of present illness.  Patient denies  fevers or chills.   Ortho Exam: Orthopedic exam demonstrates full active and passive range of motion of the right knee with trace effusion.  There is some medial  joint line tenderness.  On the left knee the patient has some ligamentous instability medially and laterally.  Extensor mechanism is intact.  Pedal pulses palpable.  Trace effusion is present.  No proximal lymphadenopathy is present.  Range of motion is full extension to about 110 of flexion.  Specialty Comments:  No specialty comments available.  Imaging: No results found.   PMFS History: Patient Active Problem List   Diagnosis Date Noted  . Presence of left artificial knee joint 12/20/2016  . Presence of right artificial knee joint 12/20/2016  . Essential hypertension 08/08/2016  . Cardiomyopathy, ischemic 08/08/2016  . NSTEMI (non-ST elevated myocardial infarction) ([place]) 08/07/2016  . Obesity (BMI 30-39.9) 08/07/2016  .  Hyperlipidemia 08/07/2016  . CAD (coronary artery disease), native coronary artery 08/07/2016  . Restless leg syndrome 09/16/2010  . Hypertensive heart disease without CHF 11/22/2007  . Osteoarthritis 11/22/2007   Past Medical History:  Diagnosis Date  . BPH associated with nocturia   . CAD in native artery    a. NSTEMI 07/2016 -  LHC 08/07/16 showing 70% D1, 20% mLAD, segmental 95% then 75% OM stenosis, 85% acute marg, normal LVEDP - received PTCA/DES to OM.  Marland Kitchen Encephalitis, viral   . Hyperlipidemia 08/07/2016  . Hypertension   . Ischemic cardiomyopathy    a. LHC 07/2016 - mild mid anterolateral focal hypocontractility felt to be due to the circumflex stenosis, EF 50%.  . OA (osteoarthritis)   . Obesity (BMI 30-39.9) 08/07/2016  . Renal insufficiency    a. 2016 r/t dehydration/ACEI use.  Marland Kitchen Restless leg syndrome 09/16/2010  . Rheumatic fever   . Syncope     Family History  Problem Relation Age of Onset  . Hypertension Mother   . Heart disease Mother   . Prostate cancer Father   . Dementia Father   . Depression Brother   . Hypertension Brother   . Hypertension Brother   . Stroke Brother     Past Surgical History:  Procedure Laterality Date  . CARDIAC CATHETERIZATION N/A 08/07/2016   Procedure: Left Heart Cath and Coronary Angiography;  Surgeon: Troy Sine, MD;  Location: [facility];  Service: Cardiovascular;  Laterality: N/A;  . CARDIAC CATHETERIZATION N/A 08/07/2016   Procedure: Coronary Stent Intervention;  Surgeon: Troy Sine, MD;  Location: [facility];  Service: Cardiovascular;  Laterality: N/A;  . REPLACEMENT TOTAL KNEE     left and right-  5 total   . TONSILLECTOMY     Social History   Occupational History  . [employer]   Social History Main Topics  . Smoking status: Never Smoker  . Smokeless tobacco: Never Used  . Alcohol use No  . Drug use: No  . Sexual activity: Yes

## 2017-03-21 ENCOUNTER — Telehealth (INDEPENDENT_AMBULATORY_CARE_PROVIDER_SITE_OTHER): Payer: Self-pay

## 2017-03-21 NOTE — Telephone Encounter (Signed)
Called patient to let him know could pick up handicap application at front desk. No answer and no voicemail set up to leave message.

## 2017-03-27 ENCOUNTER — Telehealth (INDEPENDENT_AMBULATORY_CARE_PROVIDER_SITE_OTHER): Payer: Self-pay | Admitting: Orthopedic Surgery

## 2017-03-27 ENCOUNTER — Ambulatory Visit (INDEPENDENT_AMBULATORY_CARE_PROVIDER_SITE_OTHER): Payer: BLUE CROSS/BLUE SHIELD | Admitting: Cardiovascular Disease

## 2017-03-27 ENCOUNTER — Encounter: Payer: Self-pay | Admitting: Cardiovascular Disease

## 2017-03-27 VITALS — BP 138/86 | HR 66 | Ht 72.0 in | Wt 240.0 lb

## 2017-03-27 DIAGNOSIS — I2510 Atherosclerotic heart disease of native coronary artery without angina pectoris: Secondary | ICD-10-CM

## 2017-03-27 DIAGNOSIS — I214 Non-ST elevation (NSTEMI) myocardial infarction: Secondary | ICD-10-CM

## 2017-03-27 DIAGNOSIS — E785 Hyperlipidemia, unspecified: Secondary | ICD-10-CM | POA: Diagnosis not present

## 2017-03-27 DIAGNOSIS — E669 Obesity, unspecified: Secondary | ICD-10-CM | POA: Diagnosis not present

## 2017-03-27 DIAGNOSIS — Z01818 Encounter for other preprocedural examination: Secondary | ICD-10-CM

## 2017-03-27 DIAGNOSIS — I119 Hypertensive heart disease without heart failure: Secondary | ICD-10-CM

## 2017-03-27 DIAGNOSIS — I10 Essential (primary) hypertension: Secondary | ICD-10-CM | POA: Diagnosis not present

## 2017-03-27 NOTE — Progress Notes (Signed)
Cardiology Office Note    Date:  03/27/2017   ID:  Jacob Quinn, Jacob Quinn 22-Feb-1956, MRN [id number]  PCP:  Dorothyann Peng, NP  Cardiologist:  Shelva Majestic, MD   F/U office visit with me following his 08/07/2016 NSTEMI  History of Present Illness:  Jacob Quinn is a 61 y.o. male who presents for 1 month follow-up office visit after initially being seen by me in the office on 02/24/2017 following his myocardial infarction.    Jacob Quinn was admitted to [facility] in the early morning 08/07/2016 with unstable angina.  He previously had experienced recurrent episodes of chest pressure and prior to his admission to the hospital was told of having reflux.  He was brought to the cardiac catheterization laboratory and catheterization by me revealed three-vessel CAD with 70% ostial stenosis in the first diagonal branch of the LAD with 20% mid LAD stenoses, segmental 95 and 70% circumflex marginal stenoses, and 85% stenosis in a small anterior RV marginal branch of the RCA.  He underwent successful PCI to the circumflex marginal vessel with ultimate insertion of a 3.022 mm Resolute DES stent postdilated with stent taper from 325-3.05 and with the segmental 95 and 75% stenoses being reduced to 0%.  He was seen by Rosaria Ferries 08/24/2016 for post hospital follow-up.  At that time was stable on aspirin/Brilinta for dual antiplatelet therapy.  His blood pressure was elevated and he was on amlodipine 10 mg, lisinopril 20 mg in addition to atorvastatin 80 mg.  For some reason, he never presented for follow-up to see me for subsequent office visit until 02/24/2017.   He is retired and previously had worked at [employer].  He denied any recurrent chest pain, PND, orthopnea.  He had undergone laboratory on 02/06/2017 which showed a total cholesterol 195, LDL cholesterol 133, HDL 36, triglycerides 126.  He had normal renal function with a potassium of 4.4, and creatinine 1.01.  Thyroid function studies  were normal.  When I saw him, he was significantly hypertensive with a blood pressure of 184/98.  He had been on amlodipine 10 mg and lisinopril 30 mg for hypertension.  When I saw him, I titrated lisinopril to 40 mg, and added spironolactone 12.5 mg twice a day to his medical regimen.  He also had significant hyperlipidemia despite taking atorvastatin 80 mg and I added Zetia 10 mg to his medical regimen with a target LDL less than 70.  Over the past month, he has felt improved.  He had undergone an orthopedic evaluation by Dr. Alphonzo Severance and may need to undergo knee surgery.  However, he was advised to see me back for cardiology clearance.  He had undergone a CRP which was normal.  An erythrocyte sedimentation rate by Dr. Marlou Sa.  Hemoccult was 12.8, hematocrit 38.4.  Jacob Quinn denies any recurrent chest pain symptomatology.  He has felt improved on his current medical regimen.  He presents for evaluation.    Past Medical History:  Diagnosis Date  . BPH associated with nocturia   . CAD in native artery    a. NSTEMI 07/2016 -  LHC 08/07/16 showing 70% D1, 20% mLAD, segmental 95% then 75% OM stenosis, 85% acute marg, normal LVEDP - received PTCA/DES to OM.  Marland Kitchen Encephalitis, viral   . Hyperlipidemia 08/07/2016  . Hypertension   . Ischemic cardiomyopathy    a. LHC 07/2016 - mild mid anterolateral focal hypocontractility felt to be due to the circumflex stenosis, EF 50%.  . OA (  osteoarthritis)   . Obesity (BMI 30-39.9) 08/07/2016  . Renal insufficiency    a. 2016 r/t dehydration/ACEI use.  Marland Kitchen Restless leg syndrome 09/16/2010  . Rheumatic fever   . Syncope     Past Surgical History:  Procedure Laterality Date  . CARDIAC CATHETERIZATION N/A 08/07/2016   Procedure: Left Heart Cath and Coronary Angiography;  Surgeon: Troy Sine, MD;  Location: [place] [facility];  Service: Cardiovascular;  Laterality: N/A;  . CARDIAC CATHETERIZATION N/A 08/07/2016   Procedure: Coronary Stent Intervention;  Surgeon:  Troy Sine, MD;  Location: [place] [facility];  Service: Cardiovascular;  Laterality: N/A;  . REPLACEMENT TOTAL KNEE     left and right-  5 total   . TONSILLECTOMY      Current Medications: Outpatient Medications Prior to Visit  Medication Sig Dispense Refill  . amLODipine (NORVASC) 10 MG tablet Take 1 tablet (10 mg total) by mouth daily. 90 tablet 3  . aspirin EC 81 MG EC tablet Take 1 tablet (81 mg total) by mouth daily. 30 tablet 11  . atorvastatin (LIPITOR) 80 MG tablet Take 1 tablet (80 mg total) by mouth every evening. 90 tablet 3  . ezetimibe (ZETIA) 10 MG tablet Take 1 tablet (10 mg total) by mouth daily. 90 tablet 3  . ibuprofen (ADVIL,MOTRIN) 800 MG tablet Take 1 tablet (800 mg total) by mouth every 8 (eight) hours as needed. 30 tablet 0  . ibuprofen (ADVIL,MOTRIN) 800 MG tablet Take 1 tablet (800 mg total) by mouth every 8 (eight) hours as needed. 30 tablet 0  . Ibuprofen-Famotidine (DUEXIS) 800-26.6 MG TABS Take 1 tablet by mouth 3 (three) times daily. 90 tablet 2  . lisinopril (PRINIVIL,ZESTRIL) 40 MG tablet Take 1 tablet (40 mg total) by mouth daily. 90 tablet 3  . Multiple Vitamin (MULTI VITAMIN DAILY) TABS Take 1 tablet by mouth daily.    . nitroGLYCERIN (NITROSTAT) 0.4 MG SL tablet Place 1 tablet (0.4 mg total) under the tongue every 5 (five) minutes as needed for chest pain. Up to 3 doses. 25 tablet 3  . oxyCODONE (OXY IR/ROXICODONE) 5 MG immediate release tablet Take 1 tablet (5 mg total) by mouth every 4 (four) hours as needed for severe pain. 40 tablet 0  . spironolactone (ALDACTONE) 25 MG tablet Take 0.5 tablets (12.5 mg total) by mouth 2 (two) times daily. 90 tablet 3  . ticagrelor (BRILINTA) 90 MG TABS tablet Take 1 tablet (90 mg total) by mouth every 12 (twelve) hours. 180 tablet 3   No facility-administered medications prior to visit.      Allergies:   Penicillins   Social History   Social History  . Marital status: Single    Spouse name: N/A  . Number  of children: 3  . Years of education: N/A   Occupational History  . [employer]   Social History Main Topics  . Smoking status: Never Smoker  . Smokeless tobacco: Never Used  . Alcohol use No  . Drug use: No  . Sexual activity: Yes   Other Topics Concern  . None   Social History Narrative   Lives with daughter   Is retired -      Family History:  The patient's family history includes Dementia in his father; Depression in his brother; Heart disease in his mother; Hypertension in his brother, brother, and mother; Prostate cancer in his father; Stroke in his brother.   ROS General:  Negative; No fevers, chills, or night sweats;  HEENT: Negative; No changes in vision or hearing, sinus congestion, difficulty swallowing Pulmonary: Negative; No cough, wheezing, shortness of breath, hemoptysis Cardiovascular:  See HPI GI: Negative; No nausea, vomiting, diarrhea, or abdominal pain GU: Negative; No dysuria, hematuria, or difficulty voiding Musculoskeletal: Positive for left knee discomfort and will need surgery. Hematologic/Oncology: Negative; no easy bruising, bleeding Endocrine: Negative; no heat/cold intolerance; no diabetes Neuro: Negative; no changes in balance, headaches Skin: Negative; No rashes or skin lesions Psychiatric: Negative; No behavioral problems, depression Sleep: Negative; No snoring, daytime sleepiness, hypersomnolence, bruxism, restless legs, hypnogognic hallucinations, no cataplexy Other comprehensive 14 point system review is negative.   PHYSICAL EXAM:   VS:  BP 138/86   Pulse 66   Ht 6' (1.829 m)   Wt 240 lb (108.9 kg)   BMI 32.55 kg/m     Repeat blood pressure by me 136/82  Wt Readings from Last 3 Encounters:  03/27/17 240 lb (108.9 kg)  02/23/17 238 lb 3.2 oz (108 kg)  02/06/17 241 lb (109.3 kg)    General: Alert, oriented, no distress.  Skin: normal turgor, no rashes, warm and dry HEENT: Normocephalic,  atraumatic. Pupils equal round and reactive to light; sclera anicteric; extraocular muscles intact; Fundi previously noted mild arterial narrowing.  No hemorrhages. Nose without nasal septal hypertrophy Mouth/Parynx benign; Mallinpatti scale 3 Neck: No JVD, no carotid bruits; normal carotid upstroke Lungs: clear to ausculatation and percussion; no wheezing or rales Chest wall: without tenderness to palpitation Heart: PMI not displaced, RRR, s1 s2 normal, 1/6 systolic murmur, no diastolic murmur, no rubs, gallops, thrills, or heaves Abdomen: soft, nontender; no hepatosplenomehaly, BS+; abdominal aorta nontender and not dilated by palpation. Back: no CVA tenderness Pulses 2+ Musculoskeletal: full range of motion, normal strength, no joint deformities Extremities: no clubbing cyanosis or edema, Homan's sign negative  Neurologic: grossly nonfocal; Cranial nerves grossly wnl Psychologic: Normal mood and affect   Studies/Labs Reviewed:   EKG:  EKG is ordered today.ECG (independently read by me): Sinus rhythm at 66 bpm with first-degree AV block with a PR interval of 218 ms.  Occasional PAC.  No ST segment changes.  No ECG evidence for prior MI.  Probable LVH by voltage criteria.  02/24/2017 ECG (independently read by me): Sinus bradycardia 58 bpm.  First degree AV block with PR interval 250 ms.  Borderline voltage criteria for LVH.  No ST segment changes.  QTc interval 370 ms.  Recent Labs: BMP Latest Ref Rng & Units 02/06/2017 08/08/2016 08/07/2016  Glucose 70 - 99 mg/dL 93 87 91  BUN 6 - 23 mg/dL '9 10 10  ' Creatinine 0.40 - 1.50 mg/dL 1.01 1.05 1.13  Sodium 135 - 145 mEq/L 137 136 138  Potassium 3.5 - 5.1 mEq/L 4.4 3.9 3.8  Chloride 96 - 112 mEq/L 101 103 99(L)  CO2 19 - 32 mEq/L '30 25 29  ' Calcium 8.4 - 10.5 mg/dL 9.4 8.8(L) 9.6     Hepatic Function Latest Ref Rng & Units 02/06/2017 08/07/2016 05/13/2015  Total Protein 6.0 - 8.3 g/dL 6.9 7.1 7.3  Albumin 3.5 - 5.2 g/dL 4.0 3.8 4.1  AST 0  - 37 U/L '11 19 14  ' ALT 0 - 53 U/L 9 14(L) 9  Alk Phosphatase 39 - 117 U/L 85 73 74  Total Bilirubin 0.2 - 1.2 mg/dL 0.5 1.2 0.5  Bilirubin, Direct 0.0 - 0.3 mg/dL 0.1 - 0.1    CBC Latest Ref Rng & Units 03/14/2017 02/06/2017  08/08/2016  WBC 3.8 - 10.8 K/uL 8.0 10.0 9.4  Hemoglobin 13.2 - 17.1 g/dL 12.8(L) 13.2 12.2(L)  Hematocrit 38.5 - 50.0 % 38.4(L) 38.9(L) 37.8(L)  Platelets 140 - 400 K/uL 288 313.0 254   Lab Results  Component Value Date   MCV 86.3 03/14/2017   MCV 86.3 02/06/2017   MCV 89.2 08/08/2016   Lab Results  Component Value Date   TSH 2.44 02/06/2017   Lab Results  Component Value Date   HGBA1C 5.6 01/30/2015     BNP No results found for: BNP  ProBNP No results found for: PROBNP   Lipid Panel     Component Value Date/Time   CHOL 195 02/06/2017 0808   TRIG 126.0 02/06/2017 0808   HDL 36.40 (L) 02/06/2017 0808   CHOLHDL 5 02/06/2017 0808   VLDL 25.2 02/06/2017 0808   LDLCALC 133 (H) 02/06/2017 0808   LDLDIRECT 110.4 02/19/2007 0819     RADIOLOGY: No results found.   Additional studies/ records that were reviewed today include:  I reviewed the patient's October 2017 hospitalization, catheterization report, subsequent office visit with Rosaria Ferries and recent laboratory by Dr.Dean.    ASSESSMENT:    1. Coronary artery disease involving native coronary artery of native heart without angina pectoris   2. NSTEMI (non-ST elevated myocardial infarction) ([place])   3. Essential hypertension   4. Hyperlipidemia with target LDL less than 70   5. Mild obesity   6. Hypertensive heart disease without CHF   7. Preoperative clearance      PLAN:  Jacob Quinn is a 61 year old African-American male who presented with unstable angina and ruled in for non-ST segment elevation myocardial infarction on 08/07/2016.  He was found to have multivessel CAD as noted above and underwent successful stenting to his high grade segmental circumflex marginal  stenoses.  He has been without anginal symptomatology since his intervention.  When I saw for initial evaluation 1 month ago, he had stage II hypertension.  I added spironolactone 12.5 g twice a day and further titrated lisinopril to 40 mg for improved blood pressure control.  His blood pressure today is significantly better at 136/82 when rechecked by me.  He has continued to be without chest pain or shortness of breath.  He denies palpitations. He continues to take amlodipine at 10 mg.    Since he suffered an acute coronary syndrome, current guidelines recommendations are that he continue dual antiplatelet therapy for minimum of one-year prior to any discontinuance.  He is in need for future knee surgery.  Since this is potentially elective, I have recommended that he defer this surgery from this summer until October, which will be be one year following his MI.  In October, I plan to perform a one-year follow-up nuclear perfusion study to reassess his CAD and potential ischemia.  I will see him back in the office in follow-up of the nuclear study.  If at that time, he remains stable, he will then be given clearance to undergo his knee replacement but will need to hold Brilinta for at least 5 days prior to surgery.  During this time he will continue to monitor his blood pressure.  I am recommending follow-up chemistry and lipid studies be obtained within the next month to reassess potential improvement.  Adjustments to his medications will be made if necessary. He has now been on combination Zetia 10 mg and atorvastatin 80 mg for hyperlipidemia for one month with target LDL less than 70. If he  cannot reach target despite maximum dose atorvastatin and Zetia, he is a candidate for PCSK9 inhibition.  We discussed improved diet and sodium restriction.  BMI is 32.55.  Weight reduction was recommended.  I will see him in October 2018 following his nuclear study for further evaluation and preoperative  clearance.  Medication Adjustments/Labs and Tests Ordered: Current medicines are reviewed at length with the patient today.  Concerns regarding medicines are outlined above.  Medication changes, Labs and Tests ordered today are listed in the Patient Instructions below. Patient Instructions  Your physician recommends that you return for lab work in: 6 weeks.  Your physician has requested that you have a lexiscan myoview and OV in October. For further information please visit [email or web address]. Please follow instruction sheet, as given.       Signed, Shelva Majestic, MD  03/27/2017 1:08 PM    [facility] [address], [place], [place], [place]  [address] Phone: [phone]       Cardiology Office Note    Date:  03/27/2017   ID:  Krosby, Ritchie [birth year], MRN [id number]  PCP:  Dorothyann Peng, NP  Cardiologist:  Shelva Majestic, MD   Initial office visit with me following his 08/07/2016 NSTEMI  History of Present Illness:  Jacob Quinn is a 61 y.o. male who was admitted in the early morning 08/07/2016 with unstable angina.  He previously had experienced recurrent episodes of chest pressure and prior to his admission to the hospital was told of having reflux.  He was brought to the cardiac catheterization laboratory and catheterization by me revealed three-vessel CAD with 70% ostial stenosis in the first diagonal branch of the LAD with 20% mid LAD stenoses, segmental 95 and 70% circumflex marginal stenoses, and 85% stenosis in a small anterior RV marginal branch of the RCA.  He underwent successful PCI to the circumflex marginal vessel with ultimate insertion of a 3.022 mm Resolute DES stent postdilated with stent taper from 325-3.05 and with the segmental 95 and 75% stenoses being reduced to 0%.  He was seen by Rosaria Ferries 08/24/2016 for post hospital follow-up.  At that time was stable on aspirin/Brilinta for dual antiplatelet therapy.  His blood  pressure was elevated and he was on amlodipine 10 mg, lisinopril 20 mg in addition to atorvastatin 80 mg.  Some reason, he never presented for follow-up to see me for subsequent office visit.  Presently, he denies any chest pain.  He is retired and previously had worked at [employer].  He denies PND, orthopnea.  He had undergone laboratory on 02/06/2017 which showed a total cholesterol 195, LDL cholesterol 133, HDL 36, triglycerides 126.  He had normal renal function with a potassium of 4.4, and creatinine 1.01.  Thyroid function studies were normal.  He presents for evaluation    Past Medical History:  Diagnosis Date  . BPH associated with nocturia   . CAD in native artery    a. NSTEMI 07/2016 -  LHC 08/07/16 showing 70% D1, 20% mLAD, segmental 95% then 75% OM stenosis, 85% acute marg, normal LVEDP - received PTCA/DES to OM.  Marland Kitchen Encephalitis, viral   . Hyperlipidemia 08/07/2016  . Hypertension   . Ischemic cardiomyopathy    a. LHC 07/2016 - mild mid anterolateral focal hypocontractility felt to be due to the circumflex stenosis, EF 50%.  . OA (osteoarthritis)   . Obesity (BMI 30-39.9) 08/07/2016  . Renal insufficiency    a. 2016 r/t dehydration/ACEI use.  Marland Kitchen  Restless leg syndrome 09/16/2010  . Rheumatic fever   . Syncope     Past Surgical History:  Procedure Laterality Date  . CARDIAC CATHETERIZATION N/A 08/07/2016   Procedure: Left Heart Cath and Coronary Angiography;  Surgeon: Troy Sine, MD;  Location: [place] [facility];  Service: Cardiovascular;  Laterality: N/A;  . CARDIAC CATHETERIZATION N/A 08/07/2016   Procedure: Coronary Stent Intervention;  Surgeon: Troy Sine, MD;  Location: [place] [facility];  Service: Cardiovascular;  Laterality: N/A;  . REPLACEMENT TOTAL KNEE     left and right-  5 total   . TONSILLECTOMY      Current Medications: Outpatient Medications Prior to Visit  Medication Sig Dispense Refill  . amLODipine (NORVASC) 10 MG tablet Take 1 tablet (10 mg  total) by mouth daily. 90 tablet 3  . aspirin EC 81 MG EC tablet Take 1 tablet (81 mg total) by mouth daily. 30 tablet 11  . atorvastatin (LIPITOR) 80 MG tablet Take 1 tablet (80 mg total) by mouth every evening. 90 tablet 3  . ezetimibe (ZETIA) 10 MG tablet Take 1 tablet (10 mg total) by mouth daily. 90 tablet 3  . ibuprofen (ADVIL,MOTRIN) 800 MG tablet Take 1 tablet (800 mg total) by mouth every 8 (eight) hours as needed. 30 tablet 0  . ibuprofen (ADVIL,MOTRIN) 800 MG tablet Take 1 tablet (800 mg total) by mouth every 8 (eight) hours as needed. 30 tablet 0  . Ibuprofen-Famotidine (DUEXIS) 800-26.6 MG TABS Take 1 tablet by mouth 3 (three) times daily. 90 tablet 2  . lisinopril (PRINIVIL,ZESTRIL) 40 MG tablet Take 1 tablet (40 mg total) by mouth daily. 90 tablet 3  . Multiple Vitamin (MULTI VITAMIN DAILY) TABS Take 1 tablet by mouth daily.    . nitroGLYCERIN (NITROSTAT) 0.4 MG SL tablet Place 1 tablet (0.4 mg total) under the tongue every 5 (five) minutes as needed for chest pain. Up to 3 doses. 25 tablet 3  . oxyCODONE (OXY IR/ROXICODONE) 5 MG immediate release tablet Take 1 tablet (5 mg total) by mouth every 4 (four) hours as needed for severe pain. 40 tablet 0  . spironolactone (ALDACTONE) 25 MG tablet Take 0.5 tablets (12.5 mg total) by mouth 2 (two) times daily. 90 tablet 3  . ticagrelor (BRILINTA) 90 MG TABS tablet Take 1 tablet (90 mg total) by mouth every 12 (twelve) hours. 180 tablet 3   No facility-administered medications prior to visit.      Allergies:   Penicillins   Social History   Social History  . Marital status: Single    Spouse name: N/A  . Number of children: 3  . Years of education: N/A   Occupational History  . [employer]   Social History Main Topics  . Smoking status: Never Smoker  . Smokeless tobacco: Never Used  . Alcohol use No  . Drug use: No  . Sexual activity: Yes   Other Topics Concern  . None   Social History  Narrative   Lives with daughter   Is retired -      Family History:  The patient's family history includes Dementia in his father; Depression in his brother; Heart disease in his mother; Hypertension in his brother, brother, and mother; Prostate cancer in his father; Stroke in his brother.   ROS General: Negative; No fevers, chills, or night sweats;  HEENT: Negative; No changes in vision or hearing, sinus congestion, difficulty swallowing Pulmonary: Negative;  No cough, wheezing, shortness of breath, hemoptysis Cardiovascular:  See HPI GI: Negative; No nausea, vomiting, diarrhea, or abdominal pain GU: Negative; No dysuria, hematuria, or difficulty voiding Musculoskeletal: Negative; no myalgias, joint pain, or weakness Hematologic/Oncology: Negative; no easy bruising, bleeding Endocrine: Negative; no heat/cold intolerance; no diabetes Neuro: Negative; no changes in balance, headaches Skin: Negative; No rashes or skin lesions Psychiatric: Negative; No behavioral problems, depression Sleep: Negative; No snoring, daytime sleepiness, hypersomnolence, bruxism, restless legs, hypnogognic hallucinations, no cataplexy Other comprehensive 14 point system review is negative.   PHYSICAL EXAM:   VS:  BP 138/86   Pulse 66   Ht 6' (1.829 m)   Wt 240 lb (108.9 kg)   BMI 32.55 kg/m     Repeat blood pressure by me 180/90  Wt Readings from Last 3 Encounters:  03/27/17 240 lb (108.9 kg)  02/23/17 238 lb 3.2 oz (108 kg)  02/06/17 241 lb (109.3 kg)    General: Alert, oriented, no distress.  Skin: normal turgor, no rashes, warm and dry HEENT: Normocephalic, atraumatic. Pupils equal round and reactive to light; sclera anicteric; extraocular muscles intact; Fundi Mild arteriolar narrowing.  No hemorrhages Nose without nasal septal hypertrophy Mouth/Parynx benign; Mallinpatti scale 3 Neck: No JVD, no carotid bruits; normal carotid upstroke Lungs: clear to ausculatation and percussion; no wheezing  or rales Chest wall: without tenderness to palpitation Heart: PMI not displaced, RRR, s1 s2 normal, 1/6 systolic murmur, no diastolic murmur, no rubs, gallops, thrills, or heaves Abdomen: soft, nontender; no hepatosplenomehaly, BS+; abdominal aorta nontender and not dilated by palpation. Back: no CVA tenderness Pulses 2+ Musculoskeletal: full range of motion, normal strength, no joint deformities Extremities: no clubbing cyanosis or edema, Homan's sign negative  Neurologic: grossly nonfocal; Cranial nerves grossly wnl Psychologic: Normal mood and affect   Studies/Labs Reviewed:   EKG:  EKG is ordered today. ECG (independently read by me): Sinus bradycardia 58 bpm.  First degree AV block with PR interval 250 ms.  Borderline voltage criteria for LVH.  No ST segment changes.  QTc interval 370 ms.  Recent Labs: BMP Latest Ref Rng & Units 02/06/2017 08/08/2016 08/07/2016  Glucose 70 - 99 mg/dL 93 87 91  BUN 6 - 23 mg/dL '9 10 10  ' Creatinine 0.40 - 1.50 mg/dL 1.01 1.05 1.13  Sodium 135 - 145 mEq/L 137 136 138  Potassium 3.5 - 5.1 mEq/L 4.4 3.9 3.8  Chloride 96 - 112 mEq/L 101 103 99(L)  CO2 19 - 32 mEq/L '30 25 29  ' Calcium 8.4 - 10.5 mg/dL 9.4 8.8(L) 9.6     Hepatic Function Latest Ref Rng & Units 02/06/2017 08/07/2016 05/13/2015  Total Protein 6.0 - 8.3 g/dL 6.9 7.1 7.3  Albumin 3.5 - 5.2 g/dL 4.0 3.8 4.1  AST 0 - 37 U/L '11 19 14  ' ALT 0 - 53 U/L 9 14(L) 9  Alk Phosphatase 39 - 117 U/L 85 73 74  Total Bilirubin 0.2 - 1.2 mg/dL 0.5 1.2 0.5  Bilirubin, Direct 0.0 - 0.3 mg/dL 0.1 - 0.1    CBC Latest Ref Rng & Units 03/14/2017 02/06/2017 08/08/2016  WBC 3.8 - 10.8 K/uL 8.0 10.0 9.4  Hemoglobin 13.2 - 17.1 g/dL 12.8(L) 13.2 12.2(L)  Hematocrit 38.5 - 50.0 % 38.4(L) 38.9(L) 37.8(L)  Platelets 140 - 400 K/uL 288 313.0 254   Lab Results  Component Value Date   MCV 86.3 03/14/2017   MCV 86.3 02/06/2017   MCV 89.2 08/08/2016   Lab Results  Component Value Date  TSH 2.44 02/06/2017    Lab Results  Component Value Date   HGBA1C 5.6 01/30/2015     BNP No results found for: BNP  ProBNP No results found for: PROBNP   Lipid Panel     Component Value Date/Time   CHOL 195 02/06/2017 0808   TRIG 126.0 02/06/2017 0808   HDL 36.40 (L) 02/06/2017 0808   CHOLHDL 5 02/06/2017 0808   VLDL 25.2 02/06/2017 0808   LDLCALC 133 (H) 02/06/2017 0808   LDLDIRECT 110.4 02/19/2007 0819     RADIOLOGY: No results found.   Additional studies/ records that were reviewed today include:  I reviewed the patient's October 2017 hospitalization, catheterization report, subsequent office visit with Rosaria Ferries and recent laboratory.    ASSESSMENT:    1. Coronary artery disease involving native coronary artery of native heart without angina pectoris   2. NSTEMI (non-ST elevated myocardial infarction) ([place])   3. Essential hypertension   4. Hyperlipidemia with target LDL less than 70   5. Mild obesity   6. Hypertensive heart disease without CHF   7. Preoperative clearance      PLAN:  Jacob Quinn is a 61 year old Afro-American male who presented with unstable angina and ruled in for non-ST segment elevation myocardial infarction on 08/07/2016.  He was found to have multivessel CAD as noted above and underwent successful stenting to his high grade segmental circumflex marginal stenoses.  He has been without anginal symptomatology since his intervention.  He has been maintained on amlodipine 10 mg and lisinopril most recently 30 mg for hypertension.  On exam today he has stage II hypertension with initial blood pressure 184/98 when taken by the nurse.  He has normal renal function.  I am titrating lisinopril to 40 mg and will also add spironolactone 12.5 mg twice a day to his medical regimen.  If he continues to have significant hyperlipidemia despite taking atorvastatin 80 mg with target LDL less than 70  I will add Zetia 10 mg to his medical regimen.  We discussed improved  diet.  He will have a follow-up be met in 2 weeks to reassess renal fxn and K  with the addition of spironolactone.  In  6-8 weeks we will repeat chemistry profile, lipid studies.  If he cannot reach target despite maximum dose atorvastatin and Zetia, he is a candidate for PCSK9 inhibition.  We discussed improved diet and sodium restriction.  BMI is 31.4.  Weight reduction was recommended.  I will see him in 2-3 months for reevaluation.   Medication Adjustments/Labs and Tests Ordered: Current medicines are reviewed at length with the patient today.  Concerns regarding medicines are outlined above.  Medication changes, Labs and Tests ordered today are listed in the Patient Instructions below. Patient Instructions  Your physician recommends that you return for lab work in: 6 weeks.  Your physician has requested that you have a lexiscan myoview and OV in October. For further information please visit [email or web address]. Please follow instruction sheet, as given.       Signed, Shelva Majestic, MD  03/27/2017 1:08 PM    [facility] [address] [place], [place], [place]  [address] Phone: [phone]

## 2017-03-27 NOTE — Telephone Encounter (Signed)
Pt went to get cardiac clearance from Dr. West Bali for him to have surgery. Reading Dr. Lucy Chris Epic note he stated he advises pt to defer surgery until October or this fall since he will be 1 year out from have and MI. Also Dr. Georgina Peer will be doing some lab work and other test at that time. Pt is aware.

## 2017-03-27 NOTE — Patient Instructions (Signed)
Your physician recommends that you return for lab work in: 6 weeks.  Your physician has requested that you have a lexiscan myoview and OV in October. For further information please visit [email or web address]. Please follow instruction sheet, as given.

## 2017-03-27 NOTE — Telephone Encounter (Signed)
Oj thx

## 2017-04-24 ENCOUNTER — Telehealth (INDEPENDENT_AMBULATORY_CARE_PROVIDER_SITE_OTHER): Payer: Self-pay

## 2017-04-24 NOTE — Telephone Encounter (Signed)
Alfonse Flavors with [employer] called stating that patient has been denied for [facility] and a copy will be faxed to our office.  CB# is [phone]. Thank You

## 2017-05-24 ENCOUNTER — Other Ambulatory Visit (INDEPENDENT_AMBULATORY_CARE_PROVIDER_SITE_OTHER): Payer: Self-pay | Admitting: Orthopedic Surgery

## 2017-05-25 NOTE — Telephone Encounter (Signed)
Because Treasure is taking this daily he should be on Duac cysts or should take ibuprofen along with some type of GI agent.  If he wants to keep taking ibuprofen he needs to be on Pepcid were Zantac or something of that nature.  Please call to check thanks

## 2017-05-25 NOTE — Telephone Encounter (Signed)
Ok to refill 

## 2017-05-25 NOTE — Telephone Encounter (Signed)
Needs duexis or pepcid to take with ibuprofen pls call thx

## 2017-06-01 ENCOUNTER — Telehealth (INDEPENDENT_AMBULATORY_CARE_PROVIDER_SITE_OTHER): Payer: Self-pay | Admitting: Orthopedic Surgery

## 2017-06-01 NOTE — Telephone Encounter (Signed)
Patient came in requesting another handicap application.  He stated that he lost the other one.  CB#[phone].  Thank you.

## 2017-06-04 ENCOUNTER — Other Ambulatory Visit: Payer: Self-pay | Admitting: Physician Assistant

## 2017-06-04 DIAGNOSIS — I10 Essential (primary) hypertension: Secondary | ICD-10-CM

## 2017-06-04 NOTE — Telephone Encounter (Signed)
Y perm

## 2017-06-04 NOTE — Telephone Encounter (Signed)
[place] for placard? If so how long?

## 2017-06-04 NOTE — Telephone Encounter (Signed)
Called patient advised could pick up at front desk.

## 2017-06-27 ENCOUNTER — Other Ambulatory Visit (INDEPENDENT_AMBULATORY_CARE_PROVIDER_SITE_OTHER): Payer: Self-pay

## 2017-06-27 ENCOUNTER — Other Ambulatory Visit: Payer: Self-pay | Admitting: Physician Assistant

## 2017-06-27 DIAGNOSIS — I10 Essential (primary) hypertension: Secondary | ICD-10-CM

## 2017-06-27 MED ORDER — IBUPROFEN-FAMOTIDINE 800-26.6 MG PO TABS: 1.0000 | ORAL_TABLET | Freq: Two times a day (BID) | ORAL | 2 refills | Status: DC | PRN

## 2017-07-04 ENCOUNTER — Other Ambulatory Visit: Payer: Self-pay | Admitting: Physician Assistant

## 2017-07-04 DIAGNOSIS — I10 Essential (primary) hypertension: Secondary | ICD-10-CM

## 2017-07-04 NOTE — Telephone Encounter (Signed)
REFILL 

## 2017-08-09 ENCOUNTER — Telehealth (HOSPITAL_COMMUNITY): Payer: Self-pay

## 2017-08-09 NOTE — Telephone Encounter (Signed)
Encounter complete. 

## 2017-08-14 ENCOUNTER — Ambulatory Visit (INDEPENDENT_AMBULATORY_CARE_PROVIDER_SITE_OTHER): Payer: BLUE CROSS/BLUE SHIELD | Admitting: Adult Health

## 2017-08-14 ENCOUNTER — Other Ambulatory Visit (HOSPITAL_COMMUNITY)
Admission: RE | Admit: 2017-08-14 | Discharge: 2017-08-14 | Disposition: A | Discharge status: Home / Self Care | Payer: BLUE CROSS/BLUE SHIELD | Source: Ambulatory Visit | Attending: Adult Health | Admitting: Adult Health

## 2017-08-14 ENCOUNTER — Encounter: Payer: Self-pay | Admitting: Adult Health

## 2017-08-14 VITALS — BP 150/90 | Temp 98.1°F | Wt 251.0 lb

## 2017-08-14 DIAGNOSIS — Z23 Encounter for immunization: Secondary | ICD-10-CM

## 2017-08-14 DIAGNOSIS — Z202 Contact with and (suspected) exposure to infections with a predominantly sexual mode of transmission: Secondary | ICD-10-CM | POA: Diagnosis not present

## 2017-08-14 NOTE — Progress Notes (Signed)
Subjective:    Patient ID: Jacob Quinn, male    DOB: 1956/01/05, 61 y.o.   MRN: [id number]  HPI  61 year old male who  has a past medical history of BPH associated with nocturia; CAD in native artery; Encephalitis, viral; Hyperlipidemia (08/07/2016); Hypertension; Ischemic cardiomyopathy; OA (osteoarthritis); Obesity (BMI 30-39.9) (08/07/2016); Renal insufficiency; Restless leg syndrome (09/16/2010); Rheumatic fever; and Syncope.  He presents to the office today for concern of STD. He reports that he had un protected sex about two months ago with a new partner and late last week he received a phone call from her stating that she tested positive for HPV.   He would like to be checked for HPV and other STD's  He is asymptomatic   Review of Systems See HPI   Past Medical History:  Diagnosis Date  . BPH associated with nocturia   . CAD in native artery    a. NSTEMI 07/2016 -  LHC 08/07/16 showing 70% D1, 20% mLAD, segmental 95% then 75% OM stenosis, 85% acute marg, normal LVEDP - received PTCA/DES to OM.  Marland Kitchen Encephalitis, viral   . Hyperlipidemia 08/07/2016  . Hypertension   . Ischemic cardiomyopathy    a. LHC 07/2016 - mild mid anterolateral focal hypocontractility felt to be due to the circumflex stenosis, EF 50%.  . OA (osteoarthritis)   . Obesity (BMI 30-39.9) 08/07/2016  . Renal insufficiency    a. 2016 r/t dehydration/ACEI use.  Marland Kitchen Restless leg syndrome 09/16/2010  . Rheumatic fever   . Syncope     Social History   Social History  . Marital status: Single    Spouse name: N/A  . Number of children: 3  . Years of education: N/A   Occupational History  . [employer]   Social History Main Topics  . Smoking status: Never Smoker  . Smokeless tobacco: Never Used  . Alcohol use No  . Drug use: No  . Sexual activity: Yes   Other Topics Concern  . Not on file   Social History Narrative   Lives with daughter   Is retired -      Past Surgical History:  Procedure Laterality Date  . CARDIAC CATHETERIZATION N/A 08/07/2016   Procedure: Left Heart Cath and Coronary Angiography;  Surgeon: Troy Sine, MD;  Location: [facility];  Service: Cardiovascular;  Laterality: N/A;  . CARDIAC CATHETERIZATION N/A 08/07/2016   Procedure: Coronary Stent Intervention;  Surgeon: Troy Sine, MD;  Location: [facility];  Service: Cardiovascular;  Laterality: N/A;  . REPLACEMENT TOTAL KNEE     left and right-  5 total   . TONSILLECTOMY      Family History  Problem Relation Age of Onset  . Hypertension Mother   . Heart disease Mother   . Prostate cancer Father   . Dementia Father   . Depression Brother   . Hypertension Brother   . Hypertension Brother   . Stroke Brother     Allergies  Allergen Reactions  . Penicillins Anaphylaxis    Current Outpatient Prescriptions on File Prior to Visit  Medication Sig Dispense Refill  . amLODipine (NORVASC) 10 MG tablet TAKE 1 TABLET (10 MG TOTAL) BY MOUTH DAILY. 30 tablet 6  . aspirin EC 81 MG EC tablet Take 1 tablet (81 mg total) by mouth daily. 30 tablet 11  . ibuprofen (ADVIL,MOTRIN) 800 MG tablet Take 1 tablet (800 mg total)  by mouth every 8 (eight) hours as needed. 30 tablet 0  . Ibuprofen-Famotidine (DUEXIS) 800-26.6 MG TABS Take 1 tablet by mouth 2 (two) times daily as needed. 60 tablet 2  . Multiple Vitamin (MULTI VITAMIN DAILY) TABS Take 1 tablet by mouth daily.    . nitroGLYCERIN (NITROSTAT) 0.4 MG SL tablet Place 1 tablet (0.4 mg total) under the tongue every 5 (five) minutes as needed for chest pain. Up to 3 doses. 25 tablet 3  . ticagrelor (BRILINTA) 90 MG TABS tablet Take 1 tablet (90 mg total) by mouth every 12 (twelve) hours. 180 tablet 3  . atorvastatin (LIPITOR) 80 MG tablet TAKE 1 TABLET (80 MG TOTAL) BY MOUTH EVERY EVENING. (Patient not taking: Reported on 08/14/2017) 30 tablet 6  . ezetimibe (ZETIA) 10 MG tablet Take 1 tablet (10 mg total) by  mouth daily. 90 tablet 3  . lisinopril (PRINIVIL,ZESTRIL) 40 MG tablet Take 1 tablet (40 mg total) by mouth daily. 90 tablet 3   No current facility-administered medications on file prior to visit.     BP (!) 150/90 (BP Location: Left Arm)   Temp 98.1 F (36.7 C) (Oral)   Wt 251 lb (113.9 kg)   BMI 34.04 kg/m       Objective:   Physical Exam  Constitutional: He appears well-developed and well-nourished. No distress.  Cardiovascular: Normal rate, regular rhythm, normal heart sounds and intact distal pulses.  Exam reveals no gallop.   No murmur heard. Pulmonary/Chest: Effort normal and breath sounds normal. No respiratory distress. He has no wheezes. He has no rales.  Neurological: He is alert.  Skin: Skin is warm and dry. No rash noted. He is not diaphoretic. No erythema. No pallor.  Psychiatric: He has a normal mood and affect. His behavior is normal. Judgment and thought content normal.  Nursing note and vitals reviewed.     Assessment & Plan:  1. Need for prophylactic vaccination and inoculation against influenza - Flu Vaccine QUAD 6+ mos PF IM (Fluarix Quad PF)  2. STD exposure - Explained that we do not have a HPV test currently for men. Also advised that about 80 % of sexually active people have HPV and this is often asymptomatic. - return precautions given  - Advised safe sex practices   - RPR - Urine cytology ancillary only - HIV antibody   Dorothyann Peng, NP

## 2017-08-15 ENCOUNTER — Ambulatory Visit (HOSPITAL_COMMUNITY)
Admission: RE | Admit: 2017-08-15 | Discharge: 2017-08-15 | Disposition: A | Discharge status: Home / Self Care | Payer: BLUE CROSS/BLUE SHIELD | Source: Ambulatory Visit | Attending: Cardiovascular Disease | Admitting: Cardiovascular Disease

## 2017-08-15 DIAGNOSIS — Z8249 Family history of ischemic heart disease and other diseases of the circulatory system: Secondary | ICD-10-CM | POA: Insufficient documentation

## 2017-08-15 DIAGNOSIS — I429 Cardiomyopathy, unspecified: Secondary | ICD-10-CM | POA: Insufficient documentation

## 2017-08-15 DIAGNOSIS — R5383 Other fatigue: Secondary | ICD-10-CM | POA: Insufficient documentation

## 2017-08-15 DIAGNOSIS — E663 Overweight: Secondary | ICD-10-CM | POA: Diagnosis not present

## 2017-08-15 DIAGNOSIS — I2510 Atherosclerotic heart disease of native coronary artery without angina pectoris: Secondary | ICD-10-CM | POA: Insufficient documentation

## 2017-08-15 DIAGNOSIS — Z6832 Body mass index (BMI) 32.0-32.9, adult: Secondary | ICD-10-CM | POA: Diagnosis not present

## 2017-08-15 DIAGNOSIS — Z955 Presence of coronary angioplasty implant and graft: Secondary | ICD-10-CM | POA: Insufficient documentation

## 2017-08-15 DIAGNOSIS — I440 Atrioventricular block, first degree: Secondary | ICD-10-CM | POA: Insufficient documentation

## 2017-08-15 DIAGNOSIS — R001 Bradycardia, unspecified: Secondary | ICD-10-CM | POA: Diagnosis not present

## 2017-08-15 DIAGNOSIS — I119 Hypertensive heart disease without heart failure: Secondary | ICD-10-CM | POA: Diagnosis not present

## 2017-08-15 DIAGNOSIS — I252 Old myocardial infarction: Secondary | ICD-10-CM | POA: Diagnosis not present

## 2017-08-15 LAB — MYOCARDIAL PERFUSION IMAGING
CHL CUP NUCLEAR SRS: 2
CHL CUP NUCLEAR SSS: 6
LV dias vol: 159 mL (ref 62–150)
LVSYSVOL: 86 mL
NUC STRESS TID: 1.06
Peak HR: 75 {beats}/min
Rest HR: 55 {beats}/min

## 2017-08-15 LAB — URINE CYTOLOGY ANCILLARY ONLY
CHLAMYDIA, DNA PROBE: NEGATIVE
NEISSERIA GONORRHEA: NEGATIVE
TRICH (WINDOWPATH): NEGATIVE

## 2017-08-15 LAB — RPR: RPR: NONREACTIVE

## 2017-08-15 LAB — HIV ANTIBODY (ROUTINE TESTING W REFLEX): HIV: NONREACTIVE

## 2017-08-15 MED ORDER — TECHNETIUM TC 99M TETROFOSMIN IV KIT
29.7000 | PACK | Freq: Once | INTRAVENOUS | Status: AC | PRN
Start: 2017-08-15 — End: 2017-08-15
  Administered 2017-08-15: 29.7 via INTRAVENOUS
  Filled 2017-08-15: qty 30

## 2017-08-15 MED ORDER — REGADENOSON 0.4 MG/5ML IV SOLN
0.4000 mg | Freq: Once | INTRAVENOUS | Status: AC
  Administered 2017-08-15: 0.4 mg via INTRAVENOUS

## 2017-08-15 MED ORDER — TECHNETIUM TC 99M TETROFOSMIN IV KIT
10.0000 | PACK | Freq: Once | INTRAVENOUS | Status: AC | PRN
  Administered 2017-08-15: 10 via INTRAVENOUS
  Filled 2017-08-15: qty 10

## 2017-08-16 ENCOUNTER — Encounter (INDEPENDENT_AMBULATORY_CARE_PROVIDER_SITE_OTHER): Payer: Self-pay | Admitting: Orthopedic Surgery

## 2017-08-16 ENCOUNTER — Ambulatory Visit (INDEPENDENT_AMBULATORY_CARE_PROVIDER_SITE_OTHER): Payer: BLUE CROSS/BLUE SHIELD | Admitting: Orthopedic Surgery

## 2017-08-16 DIAGNOSIS — Z96652 Presence of left artificial knee joint: Secondary | ICD-10-CM | POA: Diagnosis not present

## 2017-08-16 MED ORDER — TRAMADOL HCL 50 MG PO TABS: 50.0000 mg | ORAL_TABLET | Freq: Three times a day (TID) | ORAL | 0 refills | Status: DC | PRN

## 2017-08-18 NOTE — Progress Notes (Signed)
Office Visit Note   Patient: Jacob Quinn           Date of Birth: Mar 26, 1956           MRN: [id number] Visit Date: 08/16/2017 Requested by: Jacob Peng, NP [place], [place] [address] PCP: Jacob Peng, NP  Subjective: Chief Complaint  Patient presents with  . Left Knee - Pain  . Right Knee - Pain    HPI: Jacob Quinn is a 61 year old patient with loose left total knee replacement.  Revision surgery has been encouraged for many years but he has never been able to do it.  Now he has retired and is on disability.  He had a stress test yesterday and the results of that test will be available next week.  He did have a heart attack a year ago which effectively prevented him from having any type of surgery until now.  He may be able to come off of his blood thinner.  He has had blood work in the past which indicated no infection in the knee prosthesis.  He initially had total knee prosthesis placed approximately15 years ago.  It was revised within 5 or 6 years for tibial loosening.  He did well with that until there was evidence radiographically that the component was subsiding on the tibial side.  That has continued but he has been managing.  He denies any fevers or chills              ROS: All systems reviewed are negative as they relate to the chief complaint within the history of present illness.  Patient denies  fevers or chills.   Assessment & Plan: Visit Diagnoses:  1. Presence of left artificial knee joint     Plan: impression is loose total knee replacement particularly on the tibial side with subsidence of the revision prosthesis.  This is a difficult revision problem.  Plan is for blood work to rule out infection andCT scan to evaluate the amount of bone remaining in the tibia.  Bone scan in 2016 indicated possible loosening at the stem of the femoral componen  On radiographic examination the distal femur looks well fixed but the tibia is definitely  loose.  Follow-Up Instructions: Return if symptoms worsen or fail to improve.   Orders:  No orders of the defined types were placed in this encounter.  Meds ordered this encounter  Medications  . traMADol (ULTRAM) 50 MG tablet    Sig: Take 1 tablet (50 mg total) by mouth every 8 (eight) hours as needed.    Dispense:  60 tablet    Refill:  0      Procedures: No procedures performed   Clinical Data: No additional findings.  Objective: Vital Signs: There were no vitals taken for this visit.  Physical Exam:   Constitutional: Patient appears well-developed HEENT:  Head: Normocephalic Eyes:EOM are normal Neck: Normal range of motion Cardiovascular: Normal rate Pulmonary/chest: Effort normal Neurologic: Patient is alert Skin: Skin is warm Psychiatric: Patient has normal mood and affect    Ortho Exam: orthopedic exam demonstrates palpable pedal pulses on the left with intact extensor mechanism.  He does have medial and lateral laxity and some shortening of that left lower extremity.  No proximal or distal femoral tenderness is noted.  There is no effusion in the knee.  Specialty Comments:  No specialty comments available.  Imaging: No results found.   PMFS History: Patient Active Problem List   Diagnosis Date Noted  .  Presence of left artificial knee joint 12/20/2016  . Presence of right artificial knee joint 12/20/2016  . Essential hypertension 08/08/2016  . Cardiomyopathy, ischemic 08/08/2016  . NSTEMI (non-ST elevated myocardial infarction) ([facility]) 08/07/2016  . Obesity (BMI 30-39.9) 08/07/2016  . Hyperlipidemia 08/07/2016  . CAD (coronary artery disease), native coronary artery 08/07/2016  . Restless leg syndrome 09/16/2010  . Hypertensive heart disease without CHF 11/22/2007  . Osteoarthritis 11/22/2007   Past Medical History:  Diagnosis Date  . BPH associated with nocturia   . CAD in native artery    a. NSTEMI 07/2016 -  LHC 08/07/16 showing 70% D1, 20%  mLAD, segmental 95% then 75% OM stenosis, 85% acute marg, normal LVEDP - received PTCA/DES to OM.  Marland Kitchen Encephalitis, viral   . Hyperlipidemia 08/07/2016  . Hypertension   . Ischemic cardiomyopathy    a. LHC 07/2016 - mild mid anterolateral focal hypocontractility felt to be due to the circumflex stenosis, EF 50%.  . OA (osteoarthritis)   . Obesity (BMI 30-39.9) 08/07/2016  . Renal insufficiency    a. 2016 r/t dehydration/ACEI use.  Marland Kitchen Restless leg syndrome 09/16/2010  . Rheumatic fever   . Syncope     Family History  Problem Relation Age of Onset  . Hypertension Mother   . Heart disease Mother   . Prostate cancer Father   . Dementia Father   . Depression Brother   . Hypertension Brother   . Hypertension Brother   . Stroke Brother     Past Surgical History:  Procedure Laterality Date  . CARDIAC CATHETERIZATION N/A 08/07/2016   Procedure: Left Heart Cath and Coronary Angiography;  Surgeon: Troy Sine, MD;  Location: [facility];  Service: Cardiovascular;  Laterality: N/A;  . CARDIAC CATHETERIZATION N/A 08/07/2016   Procedure: Coronary Stent Intervention;  Surgeon: Troy Sine, MD;  Location: [facility];  Service: Cardiovascular;  Laterality: N/A;  . REPLACEMENT TOTAL KNEE     left and right-  5 total   . TONSILLECTOMY     Social History   Occupational History  . [employer]   Social History Main Topics  . Smoking status: Never Smoker  . Smokeless tobacco: Never Used  . Alcohol use No  . Drug use: No  . Sexual activity: Yes

## 2017-08-20 NOTE — Addendum Note (Signed)
Addended byLaurann Montana on: 08/20/2017 10:38 AM   Modules accepted: Orders

## 2017-08-21 ENCOUNTER — Ambulatory Visit (INDEPENDENT_AMBULATORY_CARE_PROVIDER_SITE_OTHER): Payer: BLUE CROSS/BLUE SHIELD | Admitting: Orthopedic Surgery

## 2017-08-21 DIAGNOSIS — Z96652 Presence of left artificial knee joint: Secondary | ICD-10-CM

## 2017-08-22 LAB — CBC WITH DIFFERENTIAL/PLATELET
BASOS ABS: 60 {cells}/uL (ref 0–200)
BASOS PCT: 0.8 %
EOS ABS: 240 {cells}/uL (ref 15–500)
Eosinophils Relative: 3.2 %
HCT: 40.6 % (ref 38.5–50.0)
Hemoglobin: 13.8 g/dL (ref 13.2–17.1)
Lymphs Abs: 2528 cells/uL (ref 850–3900)
MCH: 28.8 pg (ref 27.0–33.0)
MCHC: 34 g/dL (ref 32.0–36.0)
MCV: 84.8 fL (ref 80.0–100.0)
MPV: 10.4 fL (ref 7.5–12.5)
Monocytes Relative: 8.3 %
NEUTROS PCT: 54 %
Neutro Abs: 4050 cells/uL (ref 1500–7800)
PLATELETS: 302 10*3/uL (ref 140–400)
RBC: 4.79 10*6/uL (ref 4.20–5.80)
RDW: 13.7 % (ref 11.0–15.0)
TOTAL LYMPHOCYTE: 33.7 %
WBC: 7.5 10*3/uL (ref 3.8–10.8)
WBCMIX: 623 {cells}/uL (ref 200–950)

## 2017-08-22 LAB — C-REACTIVE PROTEIN: CRP: 3.6 mg/L (ref <8.0)

## 2017-08-22 LAB — SEDIMENTATION RATE: SED RATE: 9 mm/h (ref 0–20)

## 2017-08-22 NOTE — Telephone Encounter (Signed)
Closing encounter

## 2017-08-22 NOTE — Progress Notes (Signed)
Please call patient with results. Thanks -labs normal.  No infection.  Awaiting CT scan and then we will make operative plan

## 2017-08-27 ENCOUNTER — Other Ambulatory Visit (INDEPENDENT_AMBULATORY_CARE_PROVIDER_SITE_OTHER): Payer: Self-pay | Admitting: Orthopedic Surgery

## 2017-08-27 ENCOUNTER — Telehealth: Payer: Self-pay | Admitting: Cardiovascular Disease

## 2017-08-27 ENCOUNTER — Ambulatory Visit
Admission: RE | Admit: 2017-08-27 | Discharge: 2017-08-27 | Disposition: A | Discharge status: Home / Self Care | Payer: BLUE CROSS/BLUE SHIELD | Source: Ambulatory Visit | Attending: Orthopedic Surgery | Admitting: Orthopedic Surgery

## 2017-08-27 DIAGNOSIS — Z96652 Presence of left artificial knee joint: Secondary | ICD-10-CM

## 2017-08-27 NOTE — Telephone Encounter (Signed)
Notes recorded by Troy Sine, MD on 08/27/2017 at 8:11 AM EDT; Low-risk nuclear study. EF 46%. Mild global hypokinesis. No definitive scar or ischemia.  Pt notified of result, he is asking if he cleared for knee replacement?

## 2017-08-27 NOTE — Telephone Encounter (Signed)
I am supposed to see the patient this month.  Based on his nuclear study he can have surgery but will need to hold Brilinta for 5 days prior to surgery.

## 2017-08-27 NOTE — Telephone Encounter (Signed)
Pt would like his Stress Test results from 08-15-17 please.

## 2017-08-29 ENCOUNTER — Ambulatory Visit (INDEPENDENT_AMBULATORY_CARE_PROVIDER_SITE_OTHER): Payer: BLUE CROSS/BLUE SHIELD | Admitting: Cardiovascular Disease

## 2017-08-29 ENCOUNTER — Encounter: Payer: Self-pay | Admitting: Cardiovascular Disease

## 2017-08-29 VITALS — BP 168/97 | HR 72 | Ht 73.0 in | Wt 247.6 lb

## 2017-08-29 DIAGNOSIS — I119 Hypertensive heart disease without heart failure: Secondary | ICD-10-CM

## 2017-08-29 DIAGNOSIS — I10 Essential (primary) hypertension: Secondary | ICD-10-CM

## 2017-08-29 DIAGNOSIS — I255 Ischemic cardiomyopathy: Secondary | ICD-10-CM

## 2017-08-29 DIAGNOSIS — E785 Hyperlipidemia, unspecified: Secondary | ICD-10-CM

## 2017-08-29 DIAGNOSIS — Z01818 Encounter for other preprocedural examination: Secondary | ICD-10-CM

## 2017-08-29 DIAGNOSIS — I2510 Atherosclerotic heart disease of native coronary artery without angina pectoris: Secondary | ICD-10-CM | POA: Diagnosis not present

## 2017-08-29 LAB — COMPREHENSIVE METABOLIC PANEL
ALBUMIN: 4.7 g/dL (ref 3.6–4.8)
ALK PHOS: 99 IU/L (ref 39–117)
ALT: 16 IU/L (ref 0–44)
AST: 15 IU/L (ref 0–40)
Albumin/Globulin Ratio: 1.6 (ref 1.2–2.2)
BUN / CREAT RATIO: 10 (ref 10–24)
BUN: 11 mg/dL (ref 8–27)
Bilirubin Total: 0.5 mg/dL (ref 0.0–1.2)
CO2: 26 mmol/L (ref 20–29)
CREATININE: 1.11 mg/dL (ref 0.76–1.27)
Calcium: 10 mg/dL (ref 8.6–10.2)
Chloride: 99 mmol/L (ref 96–106)
GFR calc non Af Amer: 70 mL/min/{1.73_m2} (ref >59)
GFR, EST AFRICAN AMERICAN: 81 mL/min/{1.73_m2} (ref >59)
GLOBULIN, TOTAL: 2.9 g/dL (ref 1.5–4.5)
Glucose: 90 mg/dL (ref 65–99)
Potassium: 4.5 mmol/L (ref 3.5–5.2)
SODIUM: 139 mmol/L (ref 134–144)
TOTAL PROTEIN: 7.6 g/dL (ref 6.0–8.5)

## 2017-08-29 LAB — LIPID PANEL
CHOL/HDL RATIO: 5.7 ratio — AB (ref 0.0–5.0)
CHOLESTEROL TOTAL: 221 mg/dL — AB (ref 100–199)
HDL: 39 mg/dL — ABNORMAL LOW (ref >39)
LDL CALC: 152 mg/dL — AB (ref 0–99)
Triglycerides: 150 mg/dL — ABNORMAL HIGH (ref 0–149)
VLDL CHOLESTEROL CAL: 30 mg/dL (ref 5–40)

## 2017-08-29 MED ORDER — METOPROLOL SUCCINATE ER 25 MG PO TB24: 25.0000 mg | ORAL_TABLET | Freq: Every day | ORAL | 3 refills | Status: DC

## 2017-08-29 NOTE — Telephone Encounter (Signed)
Has appt today 10-31

## 2017-08-29 NOTE — Patient Instructions (Signed)
Medication Instructions:  Your physician has recommended you make the following change in your medication:  1) START Toprol XL 25 mg by out ONCE daily   Labwork: Your physician recommends that you return for lab work in: TODAY (CMET/Lipid)   Testing/Procedures: none  Follow-Up: Your physician recommends that you schedule a follow-up appointment in: 6 months with Dr. Claiborne Billings.   Any Other Special Instructions Will Be Listed Below (If Applicable).     If you need a refill on your cardiac medications before your next appointment, please call your pharmacy.

## 2017-08-29 NOTE — Progress Notes (Signed)
Cardiology Office Note    Date:  08/30/2017   ID:  Jacob Quinn, Jacob Quinn 04-06-1956, MRN [id number]  PCP:  Dorothyann Peng, NP  Cardiologist:  Shelva Majestic, MD   F/U office visit with me following his 08/07/2016 NSTEMI  History of Present Illness:  Jacob Quinn is a 61 y.o. male who was admitted in the early morning 08/07/2016 with unstable angina.  He previously had experienced recurrent episodes of chest pressure and prior to his admission to the hospital was told of having reflux.  He was brought to the cardiac catheterization laboratory and catheterization by me revealed three-vessel CAD with 70% ostial stenosis in the first diagonal branch of the LAD with 20% mid LAD stenoses, segmental 95 and 70% circumflex marginal stenoses, and 85% stenosis in a small anterior RV marginal branch of the RCA.  He underwent successful PCI to the circumflex marginal vessel with ultimate insertion of a 3.022 mm Resolute DES stent postdilated with stent taper from 325-3.05 and with the segmental 95 and 75% stenoses being reduced to 0%.  He was seen by Jacob Quinn 08/24/2016 for post hospital follow-up.  At that time was stable on aspirin/Brilinta for dual antiplatelet therapy.  His blood pressure was elevated and he was on amlodipine 10 mg, lisinopril 20 mg in addition to atorvastatin 80 mg.  He never presented for follow-up to see me for subsequent office visit until April 2018.   He is retired and previously had worked at [employer].  He denies PND, orthopnea.  He had undergone laboratory on 02/06/2017 which showed a total cholesterol 195, LDL cholesterol 133, HDL 36, triglycerides 126.  He had normal renal function with a potassium of 4.4, and creatinine 1.01.  Thyroid function studies were normal.    When I initially saw him, he was hypertensive despite being on amlodipine 10 mg of lisinopril 31 g.  I further titrated lisinopril 40 mg and added spironolactone 12.5 Mill grams twice a day.  He also  continue to have hyperlipidemia despite taking atorvastatin 80 mg and I added Zetia 10 mg to his medical regimen.   Since I last saw him, he has continued to be without chest pain or shortness of breath.  He is in need for knee surgery with Jacob Quinn.  He underwent a nuclear perfusion study on 08/15/2017.  This was low risk, but showed an EF of 46% without ECG changes and without evidence for scar or ishemia.  There was mild LV enlargement.   He is unaware of any palpitations.  He denies PND, orthopnea.  He presents for preoperative evaluation.  Past Medical History:  Diagnosis Date  . BPH associated with nocturia   . CAD in native artery    a. NSTEMI 07/2016 -  LHC 08/07/16 showing 70% D1, 20% mLAD, segmental 95% then 75% OM stenosis, 85% acute marg, normal LVEDP - received PTCA/DES to OM.  Marland Kitchen Encephalitis, viral   . Hyperlipidemia 08/07/2016  . Hypertension   . Ischemic cardiomyopathy    a. LHC 07/2016 - mild mid anterolateral focal hypocontractility felt to be due to the circumflex stenosis, EF 50%.  . OA (osteoarthritis)   . Obesity (BMI 30-39.9) 08/07/2016  . Renal insufficiency    a. 2016 r/t dehydration/ACEI use.  Marland Kitchen Restless leg syndrome 09/16/2010  . Rheumatic fever   . Syncope     Past Surgical History:  Procedure Laterality Date  . CARDIAC CATHETERIZATION N/A 08/07/2016   Procedure: Left Heart Cath  and Coronary Angiography;  Surgeon: Jacob Sine, MD;  Location: [facility];  Service: Cardiovascular;  Laterality: N/A;  . CARDIAC CATHETERIZATION N/A 08/07/2016   Procedure: Coronary Stent Intervention;  Surgeon: Jacob Sine, MD;  Location: [facility];  Service: Cardiovascular;  Laterality: N/A;  . REPLACEMENT TOTAL KNEE     left and right-  5 total   . TONSILLECTOMY      Current Medications: Outpatient Medications Prior to Visit  Medication Sig Dispense Refill  . amLODipine (NORVASC) 10 MG tablet TAKE 1 TABLET (10 MG TOTAL) BY MOUTH DAILY. 30 tablet 6    . aspirin EC 81 MG EC tablet Take 1 tablet (81 mg total) by mouth daily. 30 tablet 11  . atorvastatin (LIPITOR) 80 MG tablet TAKE 1 TABLET (80 MG TOTAL) BY MOUTH EVERY EVENING. 30 tablet 6  . ezetimibe (ZETIA) 10 MG tablet Take 1 tablet (10 mg total) by mouth daily. 90 tablet 3  . ibuprofen (ADVIL,MOTRIN) 800 MG tablet Take 1 tablet (800 mg total) by mouth every 8 (eight) hours as needed. 30 tablet 0  . Ibuprofen-Famotidine (DUEXIS) 800-26.6 MG TABS Take 1 tablet by mouth 2 (two) times daily as needed. 60 tablet 2  . lisinopril (PRINIVIL,ZESTRIL) 40 MG tablet Take 1 tablet (40 mg total) by mouth daily. 90 tablet 3  . Multiple Vitamin (MULTI VITAMIN DAILY) TABS Take 1 tablet by mouth daily.    . nitroGLYCERIN (NITROSTAT) 0.4 MG SL tablet Place 1 tablet (0.4 mg total) under the tongue every 5 (five) minutes as needed for chest pain. Up to 3 doses. 25 tablet 3  . ticagrelor (BRILINTA) 90 MG TABS tablet Take 1 tablet (90 mg total) by mouth every 12 (twelve) hours. 180 tablet 3  . traMADol (ULTRAM) 50 MG tablet Take 1 tablet (50 mg total) by mouth every 8 (eight) hours as needed. 60 tablet 0   No facility-administered medications prior to visit.      Allergies:   Penicillins   Social History   Social History  . Marital status: Single    Spouse name: N/A  . Number of children: 3  . Years of education: N/A   Occupational History  . [employer]   Social History Main Topics  . Smoking status: Never Smoker  . Smokeless tobacco: Never Used  . Alcohol use No  . Drug use: No  . Sexual activity: Yes   Other Topics Concern  . None   Social History Narrative   Lives with daughter   Is retired -      Family History:  The patient's family history includes Dementia in his father; Depression in his brother; Heart disease in his mother; Hypertension in his brother, brother, and mother; Prostate cancer in his father; Stroke in his brother.   ROS General:  Negative; No fevers, chills, or night sweats;  HEENT: Negative; No changes in vision or hearing, sinus congestion, difficulty swallowing Pulmonary: Negative; No cough, wheezing, shortness of breath, hemoptysis Cardiovascular:  See HPI GI: Negative; No nausea, vomiting, diarrhea, or abdominal pain GU: Negative; No dysuria, hematuria, or difficulty voiding Musculoskeletal: Negative; no myalgias, joint pain, or weakness Hematologic/Oncology: Negative; no easy bruising, bleeding Endocrine: Negative; no heat/cold intolerance; no diabetes Neuro: Negative; no changes in balance, headaches Skin: Negative; No rashes or skin lesions Psychiatric: Negative; No behavioral problems, depression Sleep: Negative; No snoring, daytime sleepiness, hypersomnolence, bruxism, restless legs, hypnogognic hallucinations, no cataplexy Other  comprehensive 14 point system review is negative.   PHYSICAL EXAM:   VS:  BP (!) 168/97   Pulse 72   Ht '6\' 1"'  (1.854 m)   Wt 247 lb 9.6 oz (112.3 kg)   BMI 32.67 kg/m     Repeat blood pressure by me was 146/88  Wt Readings from Last 3 Encounters:  08/29/17 247 lb 9.6 oz (112.3 kg)  08/15/17 240 lb (108.9 kg)  08/14/17 251 lb (113.9 kg)    General: Alert, oriented, no distress.  Skin: normal turgor, no rashes, warm and dry HEENT: Normocephalic, atraumatic. Pupils equal round and reactive to light; sclera anicteric; extraocular muscles intact;  Nose without nasal septal hypertrophy Mouth/Parynx benign; Mallinpatti scale 3 Neck: No JVD, no carotid bruits; normal carotid upstroke Lungs: clear to ausculatation and percussion; no wheezing or rales Chest wall: without tenderness to palpitation Heart: PMI not displaced, RRR, s1 s2 normal, 1/6 systolic murmur, no diastolic murmur, no rubs, gallops, thrills, or heaves Abdomen: soft, nontender; no hepatosplenomehaly, BS+; abdominal aorta nontender and not dilated by palpation. Back: no CVA tenderness Pulses  2+ Musculoskeletal: full range of motion, normal strength, no joint deformities Extremities: no clubbing cyanosis or edema, Homan's sign negative  Neurologic: grossly nonfocal; Cranial nerves grossly wnl Psychologic: Normal mood and affect   Studies/Labs Reviewed:   EKG:  EKG is ordered today.  ECG (independently read by me): Normal sinus rhythm with first-degree AV block with a PR interval 222 ms.  No ectopy.  Borderline LVH by voltage criteria.  May 2018 ECG (independently read by me): Sinus bradycardia 58 bpm.  First degree AV block with PR interval 250 ms.  Borderline voltage criteria for LVH.  No ST segment changes.  QTc interval 370 ms.  Recent Labs: BMP Latest Ref Rng & Units 08/29/2017 02/06/2017 08/08/2016  Glucose 65 - 99 mg/dL 90 93 87  BUN 8 - 27 mg/dL '11 9 10  ' Creatinine 0.76 - 1.27 mg/dL 1.11 1.01 1.05  BUN/Creat Ratio 10 - 24 10 - -  Sodium 134 - 144 mmol/L 139 137 136  Potassium 3.5 - 5.2 mmol/L 4.5 4.4 3.9  Chloride 96 - 106 mmol/L 99 101 103  CO2 20 - 29 mmol/L '26 30 25  ' Calcium 8.6 - 10.2 mg/dL 10.0 9.4 8.8(L)     Hepatic Function Latest Ref Rng & Units 08/29/2017 02/06/2017 08/07/2016  Total Protein 6.0 - 8.5 g/dL 7.6 6.9 7.1  Albumin 3.6 - 4.8 g/dL 4.7 4.0 3.8  AST 0 - 40 IU/L '15 11 19  ' ALT 0 - 44 IU/L 16 9 14(L)  Alk Phosphatase 39 - 117 IU/L 99 85 73  Total Bilirubin 0.0 - 1.2 mg/dL 0.5 0.5 1.2  Bilirubin, Direct 0.0 - 0.3 mg/dL - 0.1 -    CBC Latest Ref Rng & Units 08/21/2017 03/14/2017 02/06/2017  WBC 3.8 - 10.8 Thousand/uL 7.5 8.0 10.0  Hemoglobin 13.2 - 17.1 g/dL 13.8 12.8(L) 13.2  Hematocrit 38.5 - 50.0 % 40.6 38.4(L) 38.9(L)  Platelets 140 - 400 Thousand/uL 302 288 313.0   Lab Results  Component Value Date   MCV 84.8 08/21/2017   MCV 86.3 03/14/2017   MCV 86.3 02/06/2017   Lab Results  Component Value Date   TSH 2.44 02/06/2017   Lab Results  Component Value Date   HGBA1C 5.6 01/30/2015     BNP No results found for: BNP  ProBNP No  results found for: PROBNP   Lipid Panel     Component Value Date/Time  CHOL 221 (H) 08/29/2017 1125   TRIG 150 (H) 08/29/2017 1125   HDL 39 (L) 08/29/2017 1125   CHOLHDL 5.7 (H) 08/29/2017 1125   CHOLHDL 5 02/06/2017 0808   VLDL 25.2 02/06/2017 0808   LDLCALC 152 (H) 08/29/2017 1125   LDLDIRECT 110.4 02/19/2007 0819     RADIOLOGY: Ct Knee Left Wo Contrast  Result Date: 08/27/2017 CLINICAL DATA:  Left knee pain and bone loss, preop. EXAM: CT OF THE LEFT KNEE WITHOUT CONTRAST TECHNIQUE: Multidetector CT imaging of the LEFT knee was performed according to the standard protocol. Multiplanar CT image reconstructions were also generated. Three-dimensional images of the left knee were rendered on a separate workstation and provided. COMPARISON:  10/15/2007 radiographs FINDINGS: Bones/Joint/Cartilage Semi constrained cemented revision arthroplasty of the left knee is noted without abnormal lining wear. Periprosthetic calcifications are seen about the tibial lining. Moderate suprapatellar joint effusion is noted. Some interval cortical bone thinning is noted over the proximal metadiaphysis of the tibia medially. Lucencies about the cement-bone interface of the femoral and tibial components raise concern for possible loosening. Ligaments Suboptimally assessed by CT. Muscles and Tendons Negative Soft tissues Negative IMPRESSION: 1. Lucencies about the cement-bone interface of the femoral and tibial components of a semiconstrained revision arthroplasty of the left knee raise concern for changes of loosening since prior radiographs. 2. Small to moderate suprapatellar joint effusion. 3. Mild to moderate cortical thinning of the metadiaphysis of the tibial cortex since prior radiographs. Electronically Signed   By: Jacob Quinn M.D.   On: 08/27/2017 16:17   Ct 3d Independent Wkst  Result Date: 08/27/2017 CLINICAL DATA:  Left knee pain and bone loss, preop. EXAM: CT OF THE LEFT KNEE WITHOUT CONTRAST  TECHNIQUE: Multidetector CT imaging of the LEFT knee was performed according to the standard protocol. Multiplanar CT image reconstructions were also generated. Three-dimensional images of the left knee were rendered on a separate workstation and provided. COMPARISON:  10/15/2007 radiographs FINDINGS: Bones/Joint/Cartilage Semi constrained cemented revision arthroplasty of the left knee is noted without abnormal lining wear. Periprosthetic calcifications are seen about the tibial lining. Moderate suprapatellar joint effusion is noted. Some interval cortical bone thinning is noted over the proximal metadiaphysis of the tibia medially. Lucencies about the cement-bone interface of the femoral and tibial components raise concern for possible loosening. Ligaments Suboptimally assessed by CT. Muscles and Tendons Negative Soft tissues Negative IMPRESSION: 1. Lucencies about the cement-bone interface of the femoral and tibial components of a semiconstrained revision arthroplasty of the left knee raise concern for changes of loosening since prior radiographs. 2. Small to moderate suprapatellar joint effusion. 3. Mild to moderate cortical thinning of the metadiaphysis of the tibial cortex since prior radiographs. Electronically Signed   By: Jacob Quinn M.D.   On: 08/27/2017 16:17     Additional studies/ records that were reviewed today include:  I reviewed the patient's October 2017 hospitalization, catheterization report, subsequent office visit with Jacob Quinn and recent laboratory.   Gated SPECT Myoview 08/15/2017 Study Highlights     The left ventricular ejection fraction is mildly decreased (45-54%).  Nuclear stress EF: 46%.  There was no ST segment deviation noted during stress.  This is a low risk study.   Low risk stress nuclear study with no ischemia or infarction; EF 46 with mild global hypokinesis; mild LVE.    ASSESSMENT:    1. Coronary artery disease involving native coronary artery  of native heart without angina pectoris   2. Essential hypertension   3.  Hyperlipidemia with target LDL less than 70   4. Cardiomyopathy, ischemic   5. Preoperative clearance   6. Hypertensive heart disease without CHF      PLAN:  Jacob Quinn is a 61 year old African-American male who presented with unstable angina and ruled in for non-ST segment elevation myocardial infarction on 08/07/2016.  He was found to have multivessel CAD as noted above and underwent successful stenting to his high grade segmental circumflex marginal stenoses.  He has been without anginal symptomatology since his intervention.  He has been maintained on amlodipine 10 mg and lisinopril most recently 30 mg for hypertension , and despite therapy when I initially saw him, had stage II hypertension.  Resolute, his blood pressure kicked continues to be elevated despite taking lisinopril 40 mg, amlodipine 10 mg, he had taken spironolactone but had run out of the medication and never had gotten it renewed.  Presently, I will add metoprolol succinate 25 mg daily to his regimen.  I've given him clearance to undergo his knee surgery.  He will need to hold Brilinta for at least 5 days prior to surgery.  He is now on atorvastatin 80 mg and Zetia 10 mg for hyperlipidemia.  We will check fasting laboratory today.  If he is unable to achieve a target LDL less than 70.  Despite high potency statin therapy with Zetia, PCSK9 inhibition will be initiated.  I discussed the results of the for your chart with him at length significant.  The values. and the significant benefit derived with exceedingly low.  LDL values, which led to a 27% reduction MI risk and 21% reduction in stroke risk when compared to baseline LDL of 92.  .  I will contact him regarding his laboratory and if his labs are stable and he was not any has reached LDL target.  I will see him in 6 was good. Otherwise, I will see him sooner following potential Repatha  initiation.   Medication Adjustments/Labs and Tests Ordered: Current medicines are reviewed at length with the patient today.  Concerns regarding medicines are outlined above.  Medication changes, Labs and Tests ordered today are listed in the Patient Instructions below.  Patient Instructions  Medication Instructions:  Your physician has recommended you make the following change in your medication:  1) START Toprol XL 25 mg by out ONCE daily   Labwork: Your physician recommends that you return for lab work in: TODAY (CMET/Lipid)   Testing/Procedures: none  Follow-Up: Your physician recommends that you schedule a follow-up appointment in: 6 months with Dr. Claiborne Billings.   Any Other Special Instructions Will Be Listed Below (If Applicable).     If you need a refill on your cardiac medications before your next appointment, please call your pharmacy.      Signed, Shelva Majestic, MD  08/30/2017 9:50 PM    [facility] [address] [place], [place], [place]  [address] Phone: [phone]

## 2017-08-30 ENCOUNTER — Other Ambulatory Visit (INDEPENDENT_AMBULATORY_CARE_PROVIDER_SITE_OTHER): Payer: Self-pay

## 2017-08-30 MED ORDER — IBUPROFEN-FAMOTIDINE 800-26.6 MG PO TABS: 1.0000 | ORAL_TABLET | Freq: Two times a day (BID) | ORAL | 2 refills | Status: DC | PRN

## 2017-09-05 ENCOUNTER — Telehealth (INDEPENDENT_AMBULATORY_CARE_PROVIDER_SITE_OTHER): Payer: Self-pay

## 2017-09-05 ENCOUNTER — Other Ambulatory Visit (INDEPENDENT_AMBULATORY_CARE_PROVIDER_SITE_OTHER): Payer: Self-pay

## 2017-09-05 DIAGNOSIS — Z7689 Persons encountering health services in other specified circumstances: Secondary | ICD-10-CM

## 2017-09-05 NOTE — Telephone Encounter (Signed)
Jacob Quinn from [facility] called stating patients request for duexis was denied. Will not be covered by patients insurance. Any other medication you want me to have him try?

## 2017-09-05 NOTE — Progress Notes (Unsigned)
I have entered referral for patient. Dr Mayer Camel has already agreed to see patient, per Dr Marlou Sa, we just need to get appt scheduled for him. Dr Marlou Sa will call patient to let him know that we are making referral.

## 2017-09-06 NOTE — Progress Notes (Signed)
Referral has been faxed to [facility] to Dr. Mayer Camel as requested. They will call pt to schedule

## 2017-09-07 ENCOUNTER — Telehealth (INDEPENDENT_AMBULATORY_CARE_PROVIDER_SITE_OTHER): Payer: Self-pay | Admitting: Orthopedic Surgery

## 2017-09-07 NOTE — Telephone Encounter (Signed)
Did you want to talk to him about the referral you want him to have or do you want me to discuss with him?

## 2017-09-07 NOTE — Telephone Encounter (Signed)
Patient just called and said he received a call and didn't know why. Please call patient back to advise --[phone]

## 2017-09-13 ENCOUNTER — Telehealth: Payer: Self-pay | Admitting: *Deleted

## 2017-09-13 NOTE — Telephone Encounter (Signed)
-----   Message from Troy Sine, MD sent at 09/09/2017  4:39 PM EST ----- As discussed in my last office note, his lipids remain elevated despite atorvastatin and Zetia.  Refer to PharmD for initiation of Repatha application for approval and treatment for which I had discussed with him.

## 2017-09-13 NOTE — Telephone Encounter (Signed)
Patient aware and verbalized understanding.   Message sent to scheduling to get appt with pharm to discuss Repatha.

## 2017-09-27 ENCOUNTER — Ambulatory Visit (INDEPENDENT_AMBULATORY_CARE_PROVIDER_SITE_OTHER): Payer: BLUE CROSS/BLUE SHIELD | Admitting: Pharmacist Clinician (PhC)/ Clinical Pharmacy Specialist

## 2017-09-27 DIAGNOSIS — E785 Hyperlipidemia, unspecified: Secondary | ICD-10-CM | POA: Diagnosis not present

## 2017-09-27 MED ORDER — EVOLOCUMAB 140 MG/ML ~~LOC~~ SOAJ: 140.0000 mg | SUBCUTANEOUS | 12 refills | Status: DC

## 2017-09-27 NOTE — Assessment & Plan Note (Signed)
Patient with ASCVD post stenting currently on atorvastatin 80 mg and ezetimibe 10 mg daily.  Despite this his LDL remains elevated at 152.   Will start paperwork to get approval for Repatha [employer].  Reviewed medication information with patient, answered all questions.   Will repeat labs after 3 months to determine improvement.

## 2017-09-27 NOTE — Progress Notes (Signed)
09/27/2017 Jacob Quinn August 08, 1956 [id number]   HPI:  Jacob Quinn is a 61 y.o. male patient of Dr Claiborne Billings, who presents today for a lipid clinic evaluation.  He has CAD with PCI to circumflex marginal in October 2017.   In addition to CAD and hyperlipidemia, his medical history is significant for hypertensive heart disease, ischemic cardiomyopathy and obesity.    Current Medications:  Atorvastatin 80 mg qd  Ezetimibe 10 mg qd  Cholesterol Goals:   LDL < 70  Family history:   Mother - CHF, passed at 48  Father  - Alzheimers passed at 76  Brother - with DM, hypertension, died from stroke at 52  Children with no know issues  Diet:   Eat out regularly, occasionally fried foods, but not often; eats plenty of vegetables, no dietary restrictions  Exercise:    problems with knees - has had multiple surgeries, unable to walk any long distances or exercise  Labs:   08/29/17:  TC 221, TG 150, HDL 39, LDL 152  Current Outpatient Medications  Medication Sig Dispense Refill  . amLODipine (NORVASC) 10 MG tablet TAKE 1 TABLET (10 MG TOTAL) BY MOUTH DAILY. 30 tablet 6  . aspirin EC 81 MG EC tablet Take 1 tablet (81 mg total) by mouth daily. 30 tablet 11  . atorvastatin (LIPITOR) 80 MG tablet TAKE 1 TABLET (80 MG TOTAL) BY MOUTH EVERY EVENING. 30 tablet 6  . ezetimibe (ZETIA) 10 MG tablet Take 1 tablet (10 mg total) by mouth daily. 90 tablet 3  . ibuprofen (ADVIL,MOTRIN) 800 MG tablet Take 1 tablet (800 mg total) by mouth every 8 (eight) hours as needed. 30 tablet 0  . Ibuprofen-Famotidine (DUEXIS) 800-26.6 MG TABS Take 1 tablet by mouth 2 (two) times daily as needed. 60 tablet 2  . Ibuprofen-Famotidine (DUEXIS) 800-26.6 MG TABS Take 1 tablet by mouth 2 (two) times daily as needed. 90 tablet 2  . lisinopril (PRINIVIL,ZESTRIL) 40 MG tablet Take 1 tablet (40 mg total) by mouth daily. 90 tablet 3  . metoprolol succinate (TOPROL XL) 25 MG 24 hr tablet Take 1 tablet (25 mg total) by mouth  daily. 90 tablet 3  . Multiple Vitamin (MULTI VITAMIN DAILY) TABS Take 1 tablet by mouth daily.    . nitroGLYCERIN (NITROSTAT) 0.4 MG SL tablet Place 1 tablet (0.4 mg total) under the tongue every 5 (five) minutes as needed for chest pain. Up to 3 doses. 25 tablet 3  . ticagrelor (BRILINTA) 90 MG TABS tablet Take 1 tablet (90 mg total) by mouth every 12 (twelve) hours. 180 tablet 3  . traMADol (ULTRAM) 50 MG tablet Take 1 tablet (50 mg total) by mouth every 8 (eight) hours as needed. 60 tablet 0   No current facility-administered medications for this visit.     Allergies  Allergen Reactions  . Penicillins Anaphylaxis    Past Medical History:  Diagnosis Date  . BPH associated with nocturia   . CAD in native artery    a. NSTEMI 07/2016 -  LHC 08/07/16 showing 70% D1, 20% mLAD, segmental 95% then 75% OM stenosis, 85% acute marg, normal LVEDP - received PTCA/DES to OM.  Marland Kitchen Encephalitis, viral   . Hyperlipidemia 08/07/2016  . Hypertension   . Ischemic cardiomyopathy    a. LHC 07/2016 - mild mid anterolateral focal hypocontractility felt to be due to the circumflex stenosis, EF 50%.  . OA (osteoarthritis)   . Obesity (BMI 30-39.9) 08/07/2016  . Renal insufficiency  a. 2016 r/t dehydration/ACEI use.  Marland Kitchen Restless leg syndrome 09/16/2010  . Rheumatic fever   . Syncope     There were no vitals taken for this visit.   Hyperlipidemia Patient with ASCVD post stenting currently on atorvastatin 80 mg and ezetimibe 10 mg daily.  Despite this his LDL remains elevated at 152.   Will start paperwork to get approval for Repatha [employer].  Reviewed medication information with patient, answered all questions.   Will repeat labs after 3 months to determine improvement.      Tommy Medal PharmD CPP [facility]

## 2017-09-27 NOTE — Patient Instructions (Signed)
We will start the process to get your insurance company to approve Jacob Quinn may hear from [facility] or [facility] about this in the next few weeks  If you want to do your first dose in the office please call us directly at [phone] Erasmo Downer or Raquel)  Evolocumab injection What is this medicine? EVOLOCUMAB (e voe LOK ue mab) is known as a PCSK9 inhibitor. It is used to lower the level of cholesterol in the blood. It may be used alone or in combination with other cholesterol-lowering drugs. This drug may also be used to reduce the risk of heart attack, stroke, and certain types of heart surgery in patients with heart disease. This medicine may be used for other purposes; ask your health care provider or pharmacist if you have questions. COMMON BRAND NAME(S): REPATHA What should I tell my health care provider before I take this medicine? They need to know if you have any of these conditions: -an unusual or allergic reaction to evolocumab, other medicines, foods, dyes, or preservatives -pregnant or trying to get pregnant -breast-feeding How should I use this medicine? This medicine is for injection under the skin. You will be taught how to prepare and give this medicine. Use exactly as directed. Take your medicine at regular intervals. Do not take your medicine more often than directed. It is important that you put your used needles and syringes in a special sharps container. Do not put them in a trash can. If you do not have a sharps container, call your pharmacist or health care provider to get one. Talk to your pediatrician regarding the use of this medicine in children. While this drug may be prescribed for children as young as 13 years for selected conditions, precautions do apply. Overdosage: If you think you have taken too much of this medicine contact a poison control center or emergency room at once. NOTE: This medicine is only for you. Do not share this medicine  with others. What if I miss a dose? If you miss a dose, take it as soon as you can if there are more than 7 days until the next scheduled dose, or skip the missed dose and take the next dose according to your original schedule. Do not take double or extra doses. What may interact with this medicine? Interactions are not expected. This list may not describe all possible interactions. Give your health care provider a list of all the medicines, herbs, non-prescription drugs, or dietary supplements you use. Also tell them if you smoke, drink alcohol, or use illegal drugs. Some items may interact with your medicine. What should I watch for while using this medicine? You may need blood work while you are taking this medicine. What side effects may I notice from receiving this medicine? Side effects that you should report to your doctor or health care professional as soon as possible: -allergic reactions like skin rash, itching or hives, swelling of the face, lips, or tongue -signs and symptoms of infection like fever or chills; cough; sore throat; pain or trouble passing urine Side effects that usually do not require medical attention (report to your doctor or health care professional if they continue or are bothersome): -diarrhea -nausea -muscle pain -pain, redness, or irritation at site where injected This list may not describe all possible side effects. Call your doctor for medical advice about side effects. You may report side effects to FDA at 1-800-FDA-1088. Where should I keep my medicine? Keep out of the  reach of children. You will be instructed on how to store this medicine. Throw away any unused medicine after the expiration date on the label. NOTE: This sheet is a summary. It may not cover all possible information. If you have questions about this medicine, talk to your doctor, pharmacist, or health care provider.  2018 Elsevier/Gold Standard (2016-10-02 13:21:53)

## 2017-10-17 ENCOUNTER — Telehealth: Payer: Self-pay | Admitting: *Deleted

## 2017-10-17 NOTE — Telephone Encounter (Signed)
Faxed to Sandi Raveling at [facility], per request.

## 2017-10-17 NOTE — Telephone Encounter (Signed)
   [facility] Pre-operative Risk Assessment    Request for surgical clearance:  1. What type of surgery is being performed? REV LEFT TKA    2. When is this surgery scheduled? 11/19/17   3. Are there any medications that need to be held prior to surgery and how long?BRILINTA 7 DAYS PRIOR   4. Practice name and name of physician performing surgery? [facility]    5. What is your office phone and fax number?P [place]   6. Anesthesia type (None, local, MAC, general) ? SPINAL ANESTHESIA

## 2017-10-17 NOTE — Telephone Encounter (Signed)
   Primary Cardiologist: No primary care provider on file.  Chart reviewed as part of pre-operative protocol coverage. Given past medical history and time since last visit, based on ACC/AHA guidelines, [name] would be at acceptable risk for the planned procedure without further cardiovascular testing.   I will route this recommendation to the requesting party via Epic fax function and remove from pre-op pool.  Please call with questions.  Recent myoview in 10.2018 was low risk, patient was cleared by Dr. Claiborne Billings during previous followup for surgery. Ok to [employer] for 7 days prior to surgery.   Jacob Quinn, [place] 10/17/2017, 3:35 PM

## 2017-11-07 ENCOUNTER — Other Ambulatory Visit: Payer: Self-pay | Admitting: Orthopedic Surgery

## 2017-11-12 ENCOUNTER — Other Ambulatory Visit: Payer: Self-pay | Admitting: Orthopedic Surgery

## 2017-11-13 NOTE — Pre-Procedure Instructions (Signed)
Jacob Quinn  11/13/2017      [id number] [place], [place] [address] [place] [address] Phone: [phone] Fax: [phone]  [facility] [place], [place] [address] Phone: [phone] Fax: [phone]  [facility] [place], [place] [address] [place] [address] Phone: [phone] Fax: [place], [place] [address] [place] [address] Phone: [phone] Fax: [phone]  [facility] [place], [place] [address] Phone: [phone] Fax: [phone]  [facility], [place], [place] - [address] [place] [address] Phone: [phone] Fax: [phone]    Your procedure is scheduled on Monday 11/19/17  [facility] to [facility] at 1020 A.M.  Call this number if you have problems the morning of surgery:  [phone]   Remember:  Do not eat food or drink liquids after midnight.  Take these medicines the morning of surgery with A SIP OF WATER - AMLODIPINE (NORVASC), METOPROLOL (TOPROL XL), TRAMADOL IF NEEDED  7 days prior to surgery STOP taking any Aspirin(unless otherwise instructed by your surgeon), Aleve, Naproxen, Ibuprofen, Motrin, Advil, Goody's, BC's, all herbal medications, fish oil, and all vitamins     Do not wear jewelry, make-up or nail polish.  Do not wear lotions, powders, or perfumes, or deodorant.  Do not shave 48 hours prior to surgery.  Men may shave face and neck.  Do not bring valuables to the hospital.  [facility] is not responsible for any belongings or valuables.  Contacts, dentures or bridgework may not be worn into surgery.  Leave your suitcase in  the car.  After surgery it may be brought to your room.  For patients admitted to the hospital, discharge time will be determined by your treatment team.  Patients discharged the day of surgery will not be allowed to drive home.   Name and phone number of your driver:  Special instructions:  [facility] - Preparing for Surgery  Before surgery, you can play an important role.  Because skin is not sterile, your skin needs to be as free of germs as possible.  You can reduce the number of germs on you skin by washing with CHG (chlorahexidine gluconate) soap before surgery.  CHG is an antiseptic cleaner which kills germs and bonds with the skin to continue killing germs even after washing.  Please DO NOT use if you have an allergy to CHG or antibacterial soaps.  If your skin becomes reddened/irritated stop using the CHG and inform your nurse when you arrive at Short Stay.  Do not shave (including legs and underarms) for at least 48 hours prior to the first CHG shower.  You may shave your face.  Please follow these instructions carefully:   1.  Shower with CHG Soap the night before surgery and the                                morning of Surgery.  2.  If you choose to wash your hair, wash your hair first as usual with your  normal shampoo.  3.  After you shampoo, rinse your hair and body thoroughly to remove the                      Shampoo.  4.  Use CHG as you would any other liquid soap.  You can apply chg directly       to the skin and wash gently with scrungie or a clean washcloth.  5.  Apply the CHG Soap to your body ONLY FROM THE NECK DOWN.        Do not use on open wounds or open sores.  Avoid contact with your eyes,       ears, mouth and genitals (private parts).  Wash genitals (private parts)       with your normal soap.  6.  Wash thoroughly, paying special attention to the area where your surgery        will be performed.  7.  Thoroughly rinse your body with warm water from the neck  down.  8.  DO NOT shower/wash with your normal soap after using and rinsing off       the CHG Soap.  9.  Pat yourself dry with a clean towel.            10.  Wear clean pajamas.            11.  Place clean sheets on your bed the night of your first shower and do not        sleep with pets.  Day of Surgery  Do not apply any lotions/deoderants the morning of surgery.  Please wear clean clothes to the hospital/surgery center.    Please read over the following fact sheets that you were given. MRSA Information and Surgical Site Infection Prevention

## 2017-11-14 ENCOUNTER — Encounter (HOSPITAL_COMMUNITY): Payer: Self-pay

## 2017-11-14 ENCOUNTER — Encounter (HOSPITAL_COMMUNITY)
Admission: RE | Admit: 2017-11-14 | Discharge: 2017-11-14 | Disposition: A | Discharge status: Home / Self Care | Payer: BLUE CROSS/BLUE SHIELD | Source: Ambulatory Visit | Attending: Orthopedic Surgery | Admitting: Orthopedic Surgery

## 2017-11-14 ENCOUNTER — Other Ambulatory Visit: Payer: Self-pay

## 2017-11-14 ENCOUNTER — Telehealth: Payer: Self-pay | Admitting: Cardiovascular Disease

## 2017-11-14 DIAGNOSIS — Z01812 Encounter for preprocedural laboratory examination: Secondary | ICD-10-CM | POA: Diagnosis not present

## 2017-11-14 DIAGNOSIS — Z01818 Encounter for other preprocedural examination: Secondary | ICD-10-CM | POA: Diagnosis not present

## 2017-11-14 DIAGNOSIS — T84018A Broken internal joint prosthesis, other site, initial encounter: Secondary | ICD-10-CM

## 2017-11-14 DIAGNOSIS — Z96659 Presence of unspecified artificial knee joint: Secondary | ICD-10-CM

## 2017-11-14 LAB — BASIC METABOLIC PANEL
Anion gap: 10 (ref 5–15)
BUN: 9 mg/dL (ref 6–20)
CHLORIDE: 103 mmol/L (ref 101–111)
CO2: 26 mmol/L (ref 22–32)
CREATININE: 1.14 mg/dL (ref 0.61–1.24)
Calcium: 9.5 mg/dL (ref 8.9–10.3)
GFR calc Af Amer: >60 mL/min (ref >60)
GFR calc non Af Amer: >60 mL/min (ref >60)
GLUCOSE: 102 mg/dL — AB (ref 65–99)
Potassium: 3.9 mmol/L (ref 3.5–5.1)
SODIUM: 139 mmol/L (ref 135–145)

## 2017-11-14 LAB — CBC WITH DIFFERENTIAL/PLATELET
BASOS PCT: 0 %
Basophils Absolute: 0 10*3/uL (ref 0.0–0.1)
EOS PCT: 2 %
Eosinophils Absolute: 0.2 10*3/uL (ref 0.0–0.7)
HEMATOCRIT: 42.8 % (ref 39.0–52.0)
Hemoglobin: 14 g/dL (ref 13.0–17.0)
LYMPHS ABS: 2.4 10*3/uL (ref 0.7–4.0)
Lymphocytes Relative: 29 %
MCH: 29.2 pg (ref 26.0–34.0)
MCHC: 32.7 g/dL (ref 30.0–36.0)
MCV: 89.4 fL (ref 78.0–100.0)
MONO ABS: 0.5 10*3/uL (ref 0.1–1.0)
MONOS PCT: 6 %
NEUTROS ABS: 5 10*3/uL (ref 1.7–7.7)
Neutrophils Relative %: 63 %
PLATELETS: 296 10*3/uL (ref 150–400)
RBC: 4.79 MIL/uL (ref 4.22–5.81)
RDW: 13.6 % (ref 11.5–15.5)
WBC: 8.1 10*3/uL (ref 4.0–10.5)

## 2017-11-14 LAB — PROTIME-INR
INR: 0.97
Prothrombin Time: 12.8 seconds (ref 11.4–15.2)

## 2017-11-14 LAB — SURGICAL PCR SCREEN
MRSA, PCR: NEGATIVE
STAPHYLOCOCCUS AUREUS: NEGATIVE

## 2017-11-14 LAB — TYPE AND SCREEN
ABO/RH(D): O POS
Antibody Screen: NEGATIVE

## 2017-11-14 LAB — URINALYSIS, ROUTINE W REFLEX MICROSCOPIC
BILIRUBIN URINE: NEGATIVE
Glucose, UA: NEGATIVE mg/dL
Hgb urine dipstick: NEGATIVE
Ketones, ur: NEGATIVE mg/dL
Leukocytes, UA: NEGATIVE
NITRITE: NEGATIVE
PROTEIN: NEGATIVE mg/dL
SPECIFIC GRAVITY, URINE: 1.013 (ref 1.005–1.030)
pH: 6 (ref 5.0–8.0)

## 2017-11-14 LAB — APTT: aPTT: 31 seconds (ref 24–36)

## 2017-11-14 NOTE — Progress Notes (Signed)
Called Dr. Damita Dunnings office twice to see if patient needs to stop baby aspirin.  Waiting on office to call back .

## 2017-11-14 NOTE — Progress Notes (Signed)
Spoke with Dr. Damita Dunnings office who stated they do want patient to stop aspirin.  Dr. Evette Georges office was called to be sure it was okay to stop aspirin and they stated Arbie Cookey would call back.

## 2017-11-14 NOTE — H&P (Signed)
TOTAL KNEE REVISION ADMISSION H&P  Patient is being admitted for left revision total knee arthroplasty.  Subjective:  Chief Complaint:left knee pain.  HPI: Jacob Quinn, 62 y.o. male, has a history of pain and functional disability in the left knee(s) due to failed previous arthroplasty and patient has failed non-surgical conservative treatments for greater than 12 weeks to include NSAID's and/or analgesics, use of assistive devices, weight reduction as appropriate and activity modification. The indications for the revision of the total knee arthroplasty are loosening of one or more components. Onset of symptoms was gradual starting 1 years ago with gradually worsening course since that time.  Prior procedures on the left knee(s) include arthroplasty.  Patient currently rates pain in the left knee(s) at 10 out of 10 with activity. There is night pain, worsening of pain with activity and weight bearing, pain that interferes with activities of daily living, pain with passive range of motion and joint swelling.  Patient has evidence of prosthetic loosening by imaging studies. This condition presents safety issues increasing the risk of falls.  There is no current active infection.  Patient Active Problem List   Diagnosis Date Noted  . Presence of left artificial knee joint 12/20/2016  . Presence of right artificial knee joint 12/20/2016  . Essential hypertension 08/08/2016  . Cardiomyopathy, ischemic 08/08/2016  . NSTEMI (non-ST elevated myocardial infarction) ([facility]) 08/07/2016  . Obesity (BMI 30-39.9) 08/07/2016  . Hyperlipidemia 08/07/2016  . CAD (coronary artery disease), native coronary artery 08/07/2016  . Restless leg syndrome 09/16/2010  . Hypertensive heart disease without CHF 11/22/2007  . Osteoarthritis 11/22/2007   Past Medical History:  Diagnosis Date  . BPH associated with nocturia   . CAD in native artery    a. NSTEMI 07/2016 -  LHC 08/07/16 showing 70% D1, 20% mLAD,  segmental 95% then 75% OM stenosis, 85% acute marg, normal LVEDP - received PTCA/DES to OM.  Marland Kitchen Encephalitis, viral   . Hyperlipidemia 08/07/2016  . Hypertension   . Ischemic cardiomyopathy    a. LHC 07/2016 - mild mid anterolateral focal hypocontractility felt to be due to the circumflex stenosis, EF 50%.  . OA (osteoarthritis)   . Obesity (BMI 30-39.9) 08/07/2016  . Renal insufficiency    a. 2016 r/t dehydration/ACEI use.  Marland Kitchen Restless leg syndrome 09/16/2010  . Rheumatic fever   . Syncope     Past Surgical History:  Procedure Laterality Date  . CARDIAC CATHETERIZATION N/A 08/07/2016   Procedure: Left Heart Cath and Coronary Angiography;  Surgeon: Troy Sine, MD;  Location: [facility];  Service: Cardiovascular;  Laterality: N/A;  . CARDIAC CATHETERIZATION N/A 08/07/2016   Procedure: Coronary Stent Intervention;  Surgeon: Troy Sine, MD;  Location: [facility];  Service: Cardiovascular;  Laterality: N/A;  . REPLACEMENT TOTAL KNEE     left and right-  5 total   . TONSILLECTOMY      No current facility-administered medications for this encounter.    Current Outpatient Medications  Medication Sig Dispense Refill Last Dose  . amLODipine (NORVASC) 10 MG tablet TAKE 1 TABLET (10 MG TOTAL) BY MOUTH DAILY. 30 tablet 6 Taking  . aspirin EC 81 MG EC tablet Take 1 tablet (81 mg total) by mouth daily. 30 tablet 11 Taking  . atorvastatin (LIPITOR) 80 MG tablet TAKE 1 TABLET (80 MG TOTAL) BY MOUTH EVERY EVENING. 30 tablet 6 Taking  . Ibuprofen-Famotidine (DUEXIS) 800-26.6 MG TABS Take 1 tablet by mouth 2 (two) times daily  as needed. 60 tablet 2 Taking  . Ibuprofen-Famotidine (DUEXIS) 800-26.6 MG TABS Take 1 tablet by mouth 2 (two) times daily as needed. (Patient taking differently: Take 1 tablet by mouth 2 (two) times daily as needed (pain). ) 90 tablet 2   . metoprolol succinate (TOPROL XL) 25 MG 24 hr tablet Take 1 tablet (25 mg total) by mouth daily. 90 tablet 3   . Multiple  Vitamin (MULTI VITAMIN DAILY) TABS Take 1 tablet by mouth daily.   Taking  . nitroGLYCERIN (NITROSTAT) 0.4 MG SL tablet Place 1 tablet (0.4 mg total) under the tongue every 5 (five) minutes as needed for chest pain. Up to 3 doses. 25 tablet 3 Taking  . ticagrelor (BRILINTA) 90 MG TABS tablet Take 1 tablet (90 mg total) by mouth every 12 (twelve) hours. 180 tablet 3 Taking  . traMADol (ULTRAM) 50 MG tablet Take 1 tablet (50 mg total) by mouth every 8 (eight) hours as needed. 60 tablet 0 Taking  . Evolocumab (REPATHA SURECLICK) 829 MG/ML SOAJ Inject 140 mg into the skin every 14 (fourteen) days. 2 pen 12   . ezetimibe (ZETIA) 10 MG tablet Take 1 tablet (10 mg total) by mouth daily. 90 tablet 3 Taking  . ibuprofen (ADVIL,MOTRIN) 800 MG tablet Take 1 tablet (800 mg total) by mouth every 8 (eight) hours as needed. (Patient not taking: Reported on 11/07/2017) 30 tablet 0 Not Taking at Unknown time  . lisinopril (PRINIVIL,ZESTRIL) 40 MG tablet Take 1 tablet (40 mg total) by mouth daily. 90 tablet 3 Taking   Allergies  Allergen Reactions  . Penicillins Anaphylaxis    Has patient had a PCN reaction causing immediate rash, facial/tongue/throat swelling, SOB or lightheadedness with hypotension: Yes Has patient had a PCN reaction causing severe rash involving mucus membranes or skin necrosis: No Has patient had a PCN reaction that required hospitalization: No Has patient had a PCN reaction occurring within the last 10 years: No If all of the above answers are "NO", then may proceed with Cephalosporin use.     Social History   Tobacco Use  . Smoking status: Never Smoker  . Smokeless tobacco: Never Used  Substance Use Topics  . Alcohol use: No    Family History  Problem Relation Age of Onset  . Hypertension Mother   . Heart disease Mother   . Prostate cancer Father   . Dementia Father   . Depression Brother   . Hypertension Brother   . Hypertension Brother   . Stroke Brother       Review of  Systems  Constitutional: Negative.   HENT: Negative.   Eyes: Negative.   Respiratory: Negative.   Cardiovascular:       HTN  Gastrointestinal: Negative.   Genitourinary: Positive for frequency.  Musculoskeletal: Positive for joint pain.  Skin: Negative.   Neurological: Negative.   Endo/Heme/Allergies: Negative.   Psychiatric/Behavioral: Negative.      Objective:  Physical Exam  Constitutional: He is oriented to person, place, and time. He appears well-developed and well-nourished.  HENT:  Head: Normocephalic and atraumatic.  Eyes: Pupils are equal, round, and reactive to light.  Neck: Normal range of motion. Neck supple.  Cardiovascular: Intact distal pulses.  Respiratory: Effort normal.  Musculoskeletal: He exhibits tenderness.  Incision over the left knee is healed one plus effusion, 1-2+ ligamentous laxity medially and laterally.  Drawer test is negative for laxity.  Neurovascular intact distally.  X-rays and blood work is reviewed with the patient.  Range  of motion the office today was 0/110.  Neurological: He is alert and oriented to person, place, and time.  Skin: Skin is warm and dry.  Psychiatric: He has a normal mood and affect. His behavior is normal. Judgment and thought content normal.    Vital signs in last 24 hours: BP: ()/()  Arterial Line BP: ()/()   Labs:  Estimated body mass index is 32.67 kg/m as calculated from the following:   Height as of 08/29/17: 6\' 1"  (1.854 m).   Weight as of 08/29/17: 112.3 kg (247 lb 9.6 oz).  Imaging Review Plain radiographs demonstrate  long x-rays were ordered of the femur and tibia showing that the diaphyses are in good condition.  There is gross loosening of the tibial stem as previously described with a least 1 center of migration of the entire tibial construct into the tibia with thinning of the metaphyseal bone.  On the femoral side.  There is no overt shortening but there is some windshield wiper ring of the femoral  stem.  I am not sure that the femoral component is actually loose.  Assessment/Plan:  End stage arthritis, left knee(s) with failed previous arthroplasty.   The patient history, physical examination, clinical judgment of the provider and imaging studies are consistent with end stage degenerative joint disease of the left knee(s), previous total knee arthroplasty. Revision total knee arthroplasty is deemed medically necessary. The treatment options including medical management, injection therapy, arthroscopy and revision arthroplasty were discussed at length. The risks and benefits of revision total knee arthroplasty were presented and reviewed. The risks due to aseptic loosening, infection, stiffness, patella tracking problems, thromboembolic complications and other imponderables were discussed. The patient acknowledged the explanation, agreed to proceed with the plan and consent was signed. Patient is being admitted for inpatient treatment for surgery, pain control, PT, OT, prophylactic antibiotics, VTE prophylaxis, progressive ambulation and ADL's and discharge planning.The patient is planning to be discharged home with home health services.

## 2017-11-14 NOTE — Telephone Encounter (Signed)
Follow up  Needs clearance to hold the aspirin as well patient having surgery on Monday 11/19/17   Request for surgical clearance:  1. What type of surgery is being performed? REV LEFT TKA    2. When is this surgery scheduled? 11/19/17   3. Are there any medications that need to be held prior to surgery and how long?BRILINTA 7 DAYS PRIOR   4. Practice name and name of physician performing surgery? [facility]    5. What is your office phone and fax number?P [place]   6. Anesthesia type (None, local, MAC, general) ? SPINAL ANESTHESIA

## 2017-11-15 ENCOUNTER — Encounter (HOSPITAL_COMMUNITY): Payer: Self-pay

## 2017-11-15 NOTE — Progress Notes (Addendum)
Anesthesia Chart Review:  Pt is a 62 year old male scheduled for L total knee revision on 11/19/2017 with Frederik Pear, MD  - Cardiologist is Shelva Majestic, MD. Last office visit 08/29/17.  Cleared for surgery 10/17/17 by Almyra Deforest, PA  PMH includes:  CAD (DES to OM 07/2016), ischemic cardiomyopathy, HTN, hyperlipidemia, chronic renal insufficiency, viral encephalitis, rheumatic fever.  Never smoker.  BMI 33.  Medications include: Amlodipine, ASA 81 mg, Lipitor, Zetia, lisinopril, metoprolol, Brilinta.  Brilinta stopped 11/11/17. - I spoke with Juliann Pulse in Dr. Damita Dunnings office.  Pt may continue baby ASA and hold day of surgery.  I notified pt by telephone  BP (!) 158/96   Pulse 60   Temp 36.6 C   Resp 20   Ht 6\' 1"  (1.854 m)   Wt 248 lb 1.6 oz (112.5 kg)   SpO2 96%   BMI 32.73 kg/m   Preoperative labs reviewed.    CXR 11/14/17: There is no pneumonia nor other acute cardiopulmonary abnormality.  EKG 08/29/17: sinus rhythm with 1st degree AV block. Moderate voltage criteria for LVH, may be normal variant.   Nuclear stress test 08/15/17:   The left ventricular ejection fraction is mildly decreased (45-54%).  Nuclear stress EF: 46%.  There was no ST segment deviation noted during stress.  This is a low risk study. - Low risk stress nuclear study with no ischemia or infarction; EF 46 with mild global hypokinesis; mild LVE  Cardiac cath 08/07/16:  - Non-STEMI secondary to high grade segmental CX marginal stenoses. - Three-vessel CAD with 70% ostial stenosis of D1, with 20% mid LAD stenosis, segmental 95%, 75%, and 35% OM stenoses; an 85% ostial stenosis and a small anterior RV marginal branch of the RCA. - Successful PCI to the OM vessel with PTCA and DES   If no changes, I anticipate pt can proceed with surgery as scheduled.   Willeen Cass, FNP-BC [facility] Short Stay Surgical Center/Anesthesiology Phone: [phone] 11/16/2017 3:05 PM

## 2017-11-15 NOTE — Progress Notes (Signed)
Patient has been notified to stop aspirin as Dr. Damita Dunnings office instructed.

## 2017-11-15 NOTE — Telephone Encounter (Signed)
Pt was previously cleared for surgery- see Dec 19th note-  "Chart reviewed as part of pre-operative protocol coverage. Given past medical history and time since last visit, based on ACC/AHA guidelines, LOUKAS ANTONSON would be at acceptable risk for the planned procedure without further cardiovascular testing.   I will route this recommendation to the requesting party via Epic fax function and remove from pre-op pool.  Please call with questions.  Recent myoview in 10.2018 was low risk, patient was cleared by Dr. Claiborne Billings during previous followup for surgery. Ok to [employer] for 7 days prior to surgery."  Almyra Deforest, PA 10/17/2017, 3:35 PM

## 2017-11-16 ENCOUNTER — Telehealth (INDEPENDENT_AMBULATORY_CARE_PROVIDER_SITE_OTHER): Payer: Self-pay | Admitting: Orthopedic Surgery

## 2017-11-16 MED ORDER — TRANEXAMIC ACID 1000 MG/10ML IV SOLN
1000.0000 mg | INTRAVENOUS | Status: AC
  Administered 2017-11-19: 2000 mg via INTRAVENOUS
  Filled 2017-11-16: qty 1100

## 2017-11-16 MED ORDER — TRANEXAMIC ACID 1000 MG/10ML IV SOLN
2000.0000 mg | INTRAVENOUS | Status: DC
  Filled 2017-11-16: qty 20

## 2017-11-16 MED ORDER — BUPIVACAINE LIPOSOME 1.3 % IJ SUSP
20.0000 mL | Freq: Once | INTRAMUSCULAR | Status: DC
  Filled 2017-11-16: qty 20

## 2017-11-16 MED ORDER — VANCOMYCIN HCL 10 G IV SOLR
1500.0000 mg | INTRAVENOUS | Status: AC
  Administered 2017-11-19: 1500 mg via INTRAVENOUS
  Filled 2017-11-16: qty 1500

## 2017-11-16 NOTE — Telephone Encounter (Signed)
Ariel from [facility] called to let you all know that the medication has been denied.  She has also sent a fax.

## 2017-11-19 ENCOUNTER — Inpatient Hospital Stay (HOSPITAL_COMMUNITY): Payer: BLUE CROSS/BLUE SHIELD | Admitting: Emergency Medicine

## 2017-11-19 ENCOUNTER — Encounter (HOSPITAL_COMMUNITY)
Admission: RE | Disposition: A | Discharge status: Home Health | Payer: Self-pay | Source: Ambulatory Visit | Attending: Orthopedic Surgery

## 2017-11-19 ENCOUNTER — Inpatient Hospital Stay (HOSPITAL_COMMUNITY)
Admission: RE | Admit: 2017-11-19 | Discharge: 2017-11-20 | DRG: 467 | Disposition: A | Discharge status: Home Health | Payer: BLUE CROSS/BLUE SHIELD | Source: Ambulatory Visit | Attending: Orthopedic Surgery | Admitting: Orthopedic Surgery

## 2017-11-19 ENCOUNTER — Inpatient Hospital Stay (HOSPITAL_COMMUNITY): Payer: BLUE CROSS/BLUE SHIELD

## 2017-11-19 ENCOUNTER — Encounter (HOSPITAL_COMMUNITY): Payer: Self-pay | Admitting: *Deleted

## 2017-11-19 ENCOUNTER — Inpatient Hospital Stay (HOSPITAL_COMMUNITY): Payer: BLUE CROSS/BLUE SHIELD | Admitting: Anesthesiology

## 2017-11-19 DIAGNOSIS — I2510 Atherosclerotic heart disease of native coronary artery without angina pectoris: Secondary | ICD-10-CM | POA: Diagnosis present

## 2017-11-19 DIAGNOSIS — I119 Hypertensive heart disease without heart failure: Secondary | ICD-10-CM | POA: Diagnosis present

## 2017-11-19 DIAGNOSIS — Z8661 Personal history of infections of the central nervous system: Secondary | ICD-10-CM | POA: Diagnosis not present

## 2017-11-19 DIAGNOSIS — Z79899 Other long term (current) drug therapy: Secondary | ICD-10-CM | POA: Diagnosis not present

## 2017-11-19 DIAGNOSIS — D62 Acute posthemorrhagic anemia: Secondary | ICD-10-CM | POA: Diagnosis not present

## 2017-11-19 DIAGNOSIS — M25562 Pain in left knee: Secondary | ICD-10-CM | POA: Diagnosis present

## 2017-11-19 DIAGNOSIS — I255 Ischemic cardiomyopathy: Secondary | ICD-10-CM | POA: Diagnosis present

## 2017-11-19 DIAGNOSIS — Z7982 Long term (current) use of aspirin: Secondary | ICD-10-CM

## 2017-11-19 DIAGNOSIS — T84018A Broken internal joint prosthesis, other site, initial encounter: Secondary | ICD-10-CM

## 2017-11-19 DIAGNOSIS — Z8249 Family history of ischemic heart disease and other diseases of the circulatory system: Secondary | ICD-10-CM | POA: Diagnosis not present

## 2017-11-19 DIAGNOSIS — T84023A Instability of internal left knee prosthesis, initial encounter: Secondary | ICD-10-CM | POA: Diagnosis present

## 2017-11-19 DIAGNOSIS — Z8042 Family history of malignant neoplasm of prostate: Secondary | ICD-10-CM | POA: Diagnosis not present

## 2017-11-19 DIAGNOSIS — Z880 Allergy status to penicillin: Secondary | ICD-10-CM

## 2017-11-19 DIAGNOSIS — Z96659 Presence of unspecified artificial knee joint: Secondary | ICD-10-CM

## 2017-11-19 DIAGNOSIS — Z6832 Body mass index (BMI) 32.0-32.9, adult: Secondary | ICD-10-CM

## 2017-11-19 DIAGNOSIS — Z87892 Personal history of anaphylaxis: Secondary | ICD-10-CM | POA: Diagnosis not present

## 2017-11-19 DIAGNOSIS — E785 Hyperlipidemia, unspecified: Secondary | ICD-10-CM | POA: Diagnosis present

## 2017-11-19 DIAGNOSIS — Z419 Encounter for procedure for purposes other than remedying health state, unspecified: Secondary | ICD-10-CM

## 2017-11-19 DIAGNOSIS — T84033A Mechanical loosening of internal left knee prosthetic joint, initial encounter: Secondary | ICD-10-CM | POA: Diagnosis present

## 2017-11-19 DIAGNOSIS — I252 Old myocardial infarction: Secondary | ICD-10-CM | POA: Diagnosis not present

## 2017-11-19 DIAGNOSIS — M1712 Unilateral primary osteoarthritis, left knee: Secondary | ICD-10-CM | POA: Diagnosis present

## 2017-11-19 DIAGNOSIS — E669 Obesity, unspecified: Secondary | ICD-10-CM | POA: Diagnosis present

## 2017-11-19 DIAGNOSIS — Z96651 Presence of right artificial knee joint: Secondary | ICD-10-CM | POA: Diagnosis present

## 2017-11-19 DIAGNOSIS — Z9181 History of falling: Secondary | ICD-10-CM

## 2017-11-19 HISTORY — PX: TOTAL KNEE REVISION: SHX996

## 2017-11-19 SURGERY — TOTAL KNEE REVISION
Anesthesia: Monitor Anesthesia Care | Site: Knee | Laterality: Left

## 2017-11-19 MED ORDER — EPHEDRINE 5 MG/ML INJ
INTRAVENOUS | Status: AC
  Filled 2017-11-19: qty 10

## 2017-11-19 MED ORDER — PHENYLEPHRINE 40 MCG/ML (10ML) SYRINGE FOR IV PUSH (FOR BLOOD PRESSURE SUPPORT)
PREFILLED_SYRINGE | INTRAVENOUS | Status: AC
  Filled 2017-11-19: qty 10

## 2017-11-19 MED ORDER — LISINOPRIL 40 MG PO TABS
40.0000 mg | ORAL_TABLET | Freq: Every day | ORAL | Status: DC
  Administered 2017-11-20: 40 mg via ORAL
  Filled 2017-11-19: qty 1

## 2017-11-19 MED ORDER — METHOCARBAMOL 1000 MG/10ML IJ SOLN
500.0000 mg | Freq: Four times a day (QID) | INTRAVENOUS | Status: DC | PRN
  Filled 2017-11-19: qty 5

## 2017-11-19 MED ORDER — ROPIVACAINE HCL 7.5 MG/ML IJ SOLN
INTRAMUSCULAR | Status: DC | PRN
  Administered 2017-11-19: 20 mL via PERINEURAL

## 2017-11-19 MED ORDER — NITROGLYCERIN 0.4 MG SL SUBL
0.4000 mg | SUBLINGUAL_TABLET | SUBLINGUAL | Status: DC | PRN
Start: 2017-11-19 — End: 2017-11-20

## 2017-11-19 MED ORDER — MIDAZOLAM HCL 2 MG/2ML IJ SOLN
INTRAMUSCULAR | Status: AC
  Filled 2017-11-19: qty 2

## 2017-11-19 MED ORDER — DIPHENHYDRAMINE HCL 12.5 MG/5ML PO ELIX: 12.5000 mg | ORAL_SOLUTION | ORAL | Status: DC | PRN

## 2017-11-19 MED ORDER — KCL IN DEXTROSE-NACL 20-5-0.45 MEQ/L-%-% IV SOLN
INTRAVENOUS | Status: DC
  Administered 2017-11-19: 19:00:00 via INTRAVENOUS
  Filled 2017-11-19: qty 1000

## 2017-11-19 MED ORDER — ASPIRIN EC 325 MG PO TBEC
325.0000 mg | DELAYED_RELEASE_TABLET | Freq: Every day | ORAL | Status: DC
  Filled 2017-11-19: qty 1

## 2017-11-19 MED ORDER — MENTHOL 3 MG MT LOZG: 1.0000 | LOZENGE | OROMUCOSAL | Status: DC | PRN

## 2017-11-19 MED ORDER — FLEET ENEMA 7-19 GM/118ML RE ENEM: 1.0000 | ENEMA | Freq: Once | RECTAL | Status: DC | PRN

## 2017-11-19 MED ORDER — METOPROLOL SUCCINATE ER 25 MG PO TB24
25.0000 mg | ORAL_TABLET | Freq: Once | ORAL | Status: AC
  Administered 2017-11-19: 25 mg via ORAL

## 2017-11-19 MED ORDER — FENTANYL CITRATE (PF) 100 MCG/2ML IJ SOLN
INTRAMUSCULAR | Status: DC | PRN
  Administered 2017-11-19: 50 ug via INTRAVENOUS
  Administered 2017-11-19: 25 ug via INTRAVENOUS
  Administered 2017-11-19: 50 ug via INTRAVENOUS
  Administered 2017-11-19 (×2): 25 ug via INTRAVENOUS
  Administered 2017-11-19: 50 ug via INTRAVENOUS
  Administered 2017-11-19: 25 ug via INTRAVENOUS

## 2017-11-19 MED ORDER — PROPOFOL 500 MG/50ML IV EMUL
INTRAVENOUS | Status: DC | PRN
  Administered 2017-11-19: 50 ug/kg/min via INTRAVENOUS

## 2017-11-19 MED ORDER — PHENOL 1.4 % MT LIQD: 1.0000 | OROMUCOSAL | Status: DC | PRN

## 2017-11-19 MED ORDER — EZETIMIBE 10 MG PO TABS
10.0000 mg | ORAL_TABLET | Freq: Every day | ORAL | Status: DC
  Administered 2017-11-19 – 2017-11-20 (×2): 10 mg via ORAL
  Filled 2017-11-19 (×2): qty 1

## 2017-11-19 MED ORDER — BISACODYL 5 MG PO TBEC: 5.0000 mg | DELAYED_RELEASE_TABLET | Freq: Every day | ORAL | Status: DC | PRN

## 2017-11-19 MED ORDER — TIZANIDINE HCL 2 MG PO TABS: 2.0000 mg | ORAL_TABLET | Freq: Four times a day (QID) | ORAL | 0 refills | Status: DC | PRN

## 2017-11-19 MED ORDER — DEXAMETHASONE SODIUM PHOSPHATE 10 MG/ML IJ SOLN
10.0000 mg | Freq: Once | INTRAMUSCULAR | Status: AC
  Administered 2017-11-20: 10 mg via INTRAVENOUS
  Filled 2017-11-19: qty 1

## 2017-11-19 MED ORDER — TRANEXAMIC ACID 1000 MG/10ML IV SOLN
INTRAVENOUS | Status: AC | PRN
  Administered 2017-11-19: 1000 mg via INTRAVENOUS

## 2017-11-19 MED ORDER — ACETAMINOPHEN 325 MG PO TABS: 650.0000 mg | ORAL_TABLET | ORAL | Status: DC | PRN

## 2017-11-19 MED ORDER — BUPIVACAINE-EPINEPHRINE (PF) 0.5% -1:200000 IJ SOLN
INTRAMUSCULAR | Status: AC
  Filled 2017-11-19: qty 60

## 2017-11-19 MED ORDER — AMLODIPINE BESYLATE 10 MG PO TABS
10.0000 mg | ORAL_TABLET | Freq: Every day | ORAL | Status: DC
  Administered 2017-11-19 – 2017-11-20 (×2): 10 mg via ORAL
  Filled 2017-11-19 (×2): qty 1

## 2017-11-19 MED ORDER — TRANEXAMIC ACID 1000 MG/10ML IV SOLN
1000.0000 mg | Freq: Once | INTRAVENOUS | Status: AC
  Administered 2017-11-19: 1000 mg via INTRAVENOUS
  Filled 2017-11-19: qty 10

## 2017-11-19 MED ORDER — FENTANYL CITRATE (PF) 250 MCG/5ML IJ SOLN
INTRAMUSCULAR | Status: AC
  Filled 2017-11-19: qty 5

## 2017-11-19 MED ORDER — CHLORHEXIDINE GLUCONATE 4 % EX LIQD: 60.0000 mL | Freq: Once | CUTANEOUS | Status: DC

## 2017-11-19 MED ORDER — ONDANSETRON HCL 4 MG PO TABS: 4.0000 mg | ORAL_TABLET | Freq: Four times a day (QID) | ORAL | Status: DC | PRN

## 2017-11-19 MED ORDER — DEXAMETHASONE SODIUM PHOSPHATE 10 MG/ML IJ SOLN
INTRAMUSCULAR | Status: DC | PRN
  Administered 2017-11-19: 10 mg via INTRAVENOUS

## 2017-11-19 MED ORDER — METHOCARBAMOL 500 MG PO TABS: 500.0000 mg | ORAL_TABLET | Freq: Four times a day (QID) | ORAL | Status: DC | PRN

## 2017-11-19 MED ORDER — CELECOXIB 200 MG PO CAPS
200.0000 mg | ORAL_CAPSULE | Freq: Two times a day (BID) | ORAL | Status: DC
  Administered 2017-11-19 – 2017-11-20 (×2): 200 mg via ORAL
  Filled 2017-11-19 (×2): qty 1

## 2017-11-19 MED ORDER — PROPOFOL 10 MG/ML IV BOLUS
INTRAVENOUS | Status: AC
  Filled 2017-11-19: qty 20

## 2017-11-19 MED ORDER — LACTATED RINGERS IV SOLN
INTRAVENOUS | Status: DC
  Administered 2017-11-19 (×3): via INTRAVENOUS

## 2017-11-19 MED ORDER — EPHEDRINE SULFATE 50 MG/ML IJ SOLN
INTRAMUSCULAR | Status: DC | PRN
  Administered 2017-11-19 (×2): 10 mg via INTRAVENOUS

## 2017-11-19 MED ORDER — DEXAMETHASONE SODIUM PHOSPHATE 10 MG/ML IJ SOLN
INTRAMUSCULAR | Status: AC
  Filled 2017-11-19: qty 1

## 2017-11-19 MED ORDER — METOCLOPRAMIDE HCL 5 MG PO TABS: 5.0000 mg | ORAL_TABLET | Freq: Three times a day (TID) | ORAL | Status: DC | PRN

## 2017-11-19 MED ORDER — HYDROMORPHONE HCL 1 MG/ML IJ SOLN: 0.5000 mg | INTRAMUSCULAR | Status: DC | PRN

## 2017-11-19 MED ORDER — SODIUM CHLORIDE 0.9 % IR SOLN
Status: DC | PRN
  Administered 2017-11-19 (×2): 1000 mL

## 2017-11-19 MED ORDER — OXYCODONE HCL 5 MG PO TABS: 5.0000 mg | ORAL_TABLET | ORAL | Status: DC | PRN

## 2017-11-19 MED ORDER — ONDANSETRON HCL 4 MG/2ML IJ SOLN: 4.0000 mg | Freq: Four times a day (QID) | INTRAMUSCULAR | Status: DC | PRN

## 2017-11-19 MED ORDER — METOCLOPRAMIDE HCL 5 MG/ML IJ SOLN: 5.0000 mg | Freq: Three times a day (TID) | INTRAMUSCULAR | Status: DC | PRN

## 2017-11-19 MED ORDER — OXYCODONE HCL 5 MG PO TABS
10.0000 mg | ORAL_TABLET | ORAL | Status: DC | PRN
  Administered 2017-11-19: 10 mg via ORAL
  Filled 2017-11-19 (×2): qty 2

## 2017-11-19 MED ORDER — ACETAMINOPHEN 650 MG RE SUPP: 650.0000 mg | RECTAL | Status: DC | PRN

## 2017-11-19 MED ORDER — LIDOCAINE 2% (20 MG/ML) 5 ML SYRINGE
INTRAMUSCULAR | Status: AC
  Filled 2017-11-19: qty 5

## 2017-11-19 MED ORDER — BUPIVACAINE LIPOSOME 1.3 % IJ SUSP
INTRAMUSCULAR | Status: DC | PRN
  Administered 2017-11-19: 70 mL

## 2017-11-19 MED ORDER — ONDANSETRON HCL 4 MG/2ML IJ SOLN
INTRAMUSCULAR | Status: DC | PRN
  Administered 2017-11-19: 4 mg via INTRAVENOUS

## 2017-11-19 MED ORDER — FENTANYL CITRATE (PF) 100 MCG/2ML IJ SOLN
INTRAMUSCULAR | Status: AC
  Filled 2017-11-19: qty 2

## 2017-11-19 MED ORDER — MIDAZOLAM HCL 2 MG/2ML IJ SOLN
2.0000 mg | Freq: Once | INTRAMUSCULAR | Status: AC
  Administered 2017-11-19: 2 mg via INTRAVENOUS

## 2017-11-19 MED ORDER — CEFUROXIME SODIUM 1.5 G IV SOLR
INTRAVENOUS | Status: DC | PRN
  Administered 2017-11-19: 1.5 g via INTRAVENOUS

## 2017-11-19 MED ORDER — METOPROLOL SUCCINATE ER 25 MG PO TB24
ORAL_TABLET | ORAL | Status: AC
  Filled 2017-11-19: qty 1

## 2017-11-19 MED ORDER — TICAGRELOR 90 MG PO TABS
90.0000 mg | ORAL_TABLET | Freq: Two times a day (BID) | ORAL | Status: DC
  Administered 2017-11-19 – 2017-11-20 (×2): 90 mg via ORAL
  Filled 2017-11-19 (×3): qty 1

## 2017-11-19 MED ORDER — DOCUSATE SODIUM 100 MG PO CAPS
100.0000 mg | ORAL_CAPSULE | Freq: Two times a day (BID) | ORAL | Status: DC
  Administered 2017-11-19 – 2017-11-20 (×2): 100 mg via ORAL
  Filled 2017-11-19 (×2): qty 1

## 2017-11-19 MED ORDER — SENNOSIDES-DOCUSATE SODIUM 8.6-50 MG PO TABS: 1.0000 | ORAL_TABLET | Freq: Every evening | ORAL | Status: DC | PRN

## 2017-11-19 MED ORDER — FENTANYL CITRATE (PF) 100 MCG/2ML IJ SOLN
50.0000 ug | Freq: Once | INTRAMUSCULAR | Status: AC
  Administered 2017-11-19: 50 ug via INTRAVENOUS

## 2017-11-19 MED ORDER — OXYCODONE-ACETAMINOPHEN 5-325 MG PO TABS: 1.0000 | ORAL_TABLET | ORAL | 0 refills | Status: DC | PRN

## 2017-11-19 MED ORDER — GABAPENTIN 300 MG PO CAPS
300.0000 mg | ORAL_CAPSULE | Freq: Three times a day (TID) | ORAL | Status: DC
  Administered 2017-11-19 – 2017-11-20 (×2): 300 mg via ORAL
  Filled 2017-11-19 (×2): qty 1

## 2017-11-19 MED ORDER — PROPOFOL 10 MG/ML IV BOLUS
INTRAVENOUS | Status: DC | PRN
  Administered 2017-11-19: 20 mg via INTRAVENOUS

## 2017-11-19 MED ORDER — ASPIRIN EC 325 MG PO TBEC: 325.0000 mg | DELAYED_RELEASE_TABLET | Freq: Two times a day (BID) | ORAL | 0 refills | Status: DC

## 2017-11-19 MED ORDER — ONDANSETRON HCL 4 MG/2ML IJ SOLN
INTRAMUSCULAR | Status: AC
  Filled 2017-11-19: qty 2

## 2017-11-19 MED ORDER — ALUM & MAG HYDROXIDE-SIMETH 200-200-20 MG/5ML PO SUSP: 30.0000 mL | ORAL | Status: DC | PRN

## 2017-11-19 MED ORDER — METOPROLOL SUCCINATE ER 25 MG PO TB24
25.0000 mg | ORAL_TABLET | Freq: Every day | ORAL | Status: DC
  Administered 2017-11-20: 25 mg via ORAL
  Filled 2017-11-19: qty 1

## 2017-11-19 MED ORDER — FENTANYL CITRATE (PF) 100 MCG/2ML IJ SOLN: 25.0000 ug | INTRAMUSCULAR | Status: DC | PRN

## 2017-11-19 MED ORDER — BUPIVACAINE-EPINEPHRINE (PF) 0.5% -1:200000 IJ SOLN
INTRAMUSCULAR | Status: DC | PRN
  Administered 2017-11-19: 5 mL via PERINEURAL

## 2017-11-19 SURGICAL SUPPLY — 88 items
BANDAGE ACE 4X5 VEL STRL LF (GAUZE/BANDAGES/DRESSINGS) ×2 IMPLANT
BANDAGE ACE 6X5 VEL STRL LF (GAUZE/BANDAGES/DRESSINGS) ×2 IMPLANT
BANDAGE ELASTIC 4 VELCRO ST LF (GAUZE/BANDAGES/DRESSINGS) ×2 IMPLANT
BANDAGE ESMARK 6X9 LF (GAUZE/BANDAGES/DRESSINGS) ×1 IMPLANT
BLADE CLIPPER SURG (BLADE) IMPLANT
BLADE SAG 18X100X1.27 (BLADE) ×2 IMPLANT
BLADE SAW (BLADE) ×2 IMPLANT
BLADE SAW SAG 90X13X1.27 (BLADE) ×2 IMPLANT
BNDG ELASTIC 6X10 VLCR STRL LF (GAUZE/BANDAGES/DRESSINGS) ×2 IMPLANT
BNDG ESMARK 6X9 LF (GAUZE/BANDAGES/DRESSINGS) ×2
BOWL SMART MIX CTS (DISPOSABLE) ×4 IMPLANT
CEMENT HV SMART SET (Cement) ×8 IMPLANT
COMPONENT FEM SROM (Hips) ×1 IMPLANT
CONNECTOR 5 IN 1 STRAIGHT STRL (MISCELLANEOUS) ×2 IMPLANT
CONT SPECI 4OZ STER CLIK (MISCELLANEOUS) ×2 IMPLANT
COVER BACK TABLE 24X17X13 BIG (DRAPES) IMPLANT
COVER SURGICAL LIGHT HANDLE (MISCELLANEOUS) ×2 IMPLANT
CUFF TOURNIQUET SINGLE 34IN LL (TOURNIQUET CUFF) ×2 IMPLANT
CUFF TOURNIQUET SINGLE 44IN (TOURNIQUET CUFF) IMPLANT
DISC DIAMOND MED (BURR) IMPLANT
DRAPE C-ARM 42X72 X-RAY (DRAPES) ×2 IMPLANT
DRAPE HALF SHEET 40X57 (DRAPES) ×2 IMPLANT
DRAPE IMP U-DRAPE 54X76 (DRAPES) ×2 IMPLANT
DRAPE U-SHAPE 47X51 STRL (DRAPES) ×2 IMPLANT
DRESSING AQUACEL AG SP 3.5X6 (GAUZE/BANDAGES/DRESSINGS) ×1 IMPLANT
DRSG AQUACEL AG ADV 3.5X14 (GAUZE/BANDAGES/DRESSINGS) ×2 IMPLANT
DRSG AQUACEL AG SP 3.5X6 (GAUZE/BANDAGES/DRESSINGS) ×2
DURAPREP 26ML APPLICATOR (WOUND CARE) ×6 IMPLANT
ELECT REM PT RETURN 9FT ADLT (ELECTROSURGICAL) ×2
ELECTRODE REM PT RTRN 9FT ADLT (ELECTROSURGICAL) ×1 IMPLANT
EVACUATOR 1/8 PVC DRAIN (DRAIN) IMPLANT
GAUZE SPONGE 4X4 12PLY STRL (GAUZE/BANDAGES/DRESSINGS) ×4 IMPLANT
GAUZE XEROFORM 1X8 LF (GAUZE/BANDAGES/DRESSINGS) ×2 IMPLANT
GLOVE BIO SURGEON STRL SZ7.5 (GLOVE) ×2 IMPLANT
GLOVE BIO SURGEON STRL SZ8.5 (GLOVE) ×4 IMPLANT
GLOVE BIOGEL PI IND STRL 6.5 (GLOVE) ×1 IMPLANT
GLOVE BIOGEL PI IND STRL 8 (GLOVE) ×2 IMPLANT
GLOVE BIOGEL PI IND STRL 9 (GLOVE) ×1 IMPLANT
GLOVE BIOGEL PI INDICATOR 6.5 (GLOVE) ×1
GLOVE BIOGEL PI INDICATOR 8 (GLOVE) ×2
GLOVE BIOGEL PI INDICATOR 9 (GLOVE) ×1
GLOVE ECLIPSE 6.0 STRL STRAW (GLOVE) ×2 IMPLANT
GOWN STRL REUS W/ TWL LRG LVL3 (GOWN DISPOSABLE) ×1 IMPLANT
GOWN STRL REUS W/ TWL XL LVL3 (GOWN DISPOSABLE) ×3 IMPLANT
GOWN STRL REUS W/TWL LRG LVL3 (GOWN DISPOSABLE) ×1
GOWN STRL REUS W/TWL XL LVL3 (GOWN DISPOSABLE) ×3
HANDPIECE INTERPULSE COAX TIP (DISPOSABLE) ×1
HOOD PEEL AWAY FACE SHEILD DIS (HOOD) ×6 IMPLANT
HOOD PEEL AWAY FLYTE STAYCOOL (MISCELLANEOUS) ×4 IMPLANT
INSERT TIBIAL MOD MED UNI 16 (Joint) ×2 IMPLANT
KIT BASIN OR (CUSTOM PROCEDURE TRAY) ×2 IMPLANT
KIT ROOM TURNOVER OR (KITS) ×2 IMPLANT
MANIFOLD NEPTUNE II (INSTRUMENTS) ×2 IMPLANT
NEEDLE SPNL 18GX3.5 QUINCKE PK (NEEDLE) ×2 IMPLANT
NS IRRIG 1000ML POUR BTL (IV SOLUTION) ×2 IMPLANT
PACK TOTAL JOINT (CUSTOM PROCEDURE TRAY) ×2 IMPLANT
PACK UNIVERSAL I (CUSTOM PROCEDURE TRAY) ×2 IMPLANT
PAD ARMBOARD 7.5X6 YLW CONV (MISCELLANEOUS) ×4 IMPLANT
PAD CAST 4YDX4 CTTN HI CHSV (CAST SUPPLIES) ×2 IMPLANT
PADDING CAST COTTON 4X4 STRL (CAST SUPPLIES) ×2
PADDING CAST COTTON 6X4 STRL (CAST SUPPLIES) ×2 IMPLANT
PADDING CAST SYNTHETIC 4 (CAST SUPPLIES) ×1
PADDING CAST SYNTHETIC 4X4 STR (CAST SUPPLIES) ×1 IMPLANT
RASP HELIOCORDIAL MED (MISCELLANEOUS) IMPLANT
SET HNDPC FAN SPRY TIP SCT (DISPOSABLE) ×1 IMPLANT
SLEEVE DISTAL 46 (Sleeve) ×2 IMPLANT
SPONGE LAP 18X18 X RAY DECT (DISPOSABLE) ×4 IMPLANT
SROM FEM COMP (Hips) ×2 IMPLANT
STAPLER VISISTAT 35W (STAPLE) ×2 IMPLANT
STEM CEMENTED 18X150 UNI TIB (Joint) ×2 IMPLANT
STEM FEMORAL 20X150 UNI TITAN (Joint) ×2 IMPLANT
SUCTION FRAZIER HANDLE 10FR (MISCELLANEOUS) ×1
SUCTION TUBE FRAZIER 10FR DISP (MISCELLANEOUS) ×1 IMPLANT
SUT VIC AB 0 CT1 27 (SUTURE) ×2
SUT VIC AB 0 CT1 27XBRD ANBCTR (SUTURE) ×2 IMPLANT
SUT VIC AB 1 CTX 36 (SUTURE) ×2
SUT VIC AB 1 CTX36XBRD ANBCTR (SUTURE) ×2 IMPLANT
SUT VIC AB 2-0 CT1 27 (SUTURE) ×2
SUT VIC AB 2-0 CT1 TAPERPNT 27 (SUTURE) ×2 IMPLANT
SWAB CULTURE ESWAB REG 1ML (MISCELLANEOUS) IMPLANT
SYR 50ML LL SCALE MARK (SYRINGE) ×2 IMPLANT
TOWEL OR 17X24 6PK STRL BLUE (TOWEL DISPOSABLE) ×4 IMPLANT
TOWEL OR 17X26 10 PK STRL BLUE (TOWEL DISPOSABLE) ×2 IMPLANT
TRAY FOLEY CATH SILVER 14FR (SET/KITS/TRAYS/PACK) IMPLANT
TRAY REVISION SZ 4 (Knees) ×2 IMPLANT
TRAY SLEEVE POROUS 61 ×2 IMPLANT
TUBING BULK SUCTION (MISCELLANEOUS) ×2 IMPLANT
WATER STERILE IRR 1000ML POUR (IV SOLUTION) ×4 IMPLANT

## 2017-11-19 NOTE — OR Nursing (Signed)
Implants given to dr Elesa Massed.

## 2017-11-19 NOTE — Transfer of Care (Signed)
Immediate Anesthesia Transfer of Care Note  Patient: LAVANCE BEAZER  Procedure(s) Performed: LEFT TOTAL KNEE REVISION (Left Knee)  Patient Location: PACU  Anesthesia Type:MAC, Regional and Spinal  Level of Consciousness: awake, alert , oriented and sedated  Airway & Oxygen Therapy: Patient Spontanous Breathing and Patient connected to face mask oxygen  Post-op Assessment: Report given to RN, Post -op Vital signs reviewed and stable and Patient moving all extremities  Post vital signs: Reviewed and stable  Last Vitals:  Vitals:   11/19/17 0925 11/19/17 0930  BP: (!) 143/81 133/85  Pulse: (!) 56 (!) 55  Resp: 10 10  Temp:    SpO2: 99% 100%    Last Pain:  Vitals:   11/19/17 0823  TempSrc: Oral      Patients Stated Pain Goal: 3 (78/24/23 5361)  Complications: No apparent anesthesia complications

## 2017-11-19 NOTE — Anesthesia Postprocedure Evaluation (Signed)
Anesthesia Post Note  Patient: Jacob Quinn  Procedure(s) Performed: LEFT TOTAL KNEE REVISION (Left Knee)     Patient location during evaluation: PACU Anesthesia Type: Regional Level of consciousness: awake and alert Pain management: pain level controlled Vital Signs Assessment: post-procedure vital signs reviewed and stable Respiratory status: spontaneous breathing and respiratory function stable Cardiovascular status: blood pressure returned to baseline and stable Postop Assessment: spinal receding Anesthetic complications: no    Last Vitals:  Vitals:   11/19/17 1505 11/19/17 1520  BP: 131/86 132/81  Pulse: 68 64  Resp: 10 (!) 9  Temp:    SpO2: 96% 94%    Last Pain:  Vitals:   11/19/17 0823  TempSrc: Oral                 Ngai Parcell DANIEL

## 2017-11-19 NOTE — Anesthesia Procedure Notes (Signed)
Anesthesia Regional Block: Adductor canal block   Pre-Anesthetic Checklist: ,, timeout performed, Correct Patient, Correct Site, Correct Laterality, Correct Procedure, Correct Position, site marked, Risks and benefits discussed, pre-op evaluation,  At surgeon's request and post-op pain management  Laterality: Left  Prep: chloraprep       Needles:   Needle Type: Echogenic Needle     Needle Length: 9cm  Needle Gauge: 21     Additional Needles:   Procedures:,,,, ultrasound used (permanent image in chart),,,,  Narrative:  Start time: 11/19/2017 9:06 AM End time: 11/19/2017 9:14 AM Injection made incrementally with aspirations every 5 mL. Anesthesiologist: Lyndle Herrlich, MD

## 2017-11-19 NOTE — Progress Notes (Signed)
Orthopedic Tech Progress Note Patient Details:  Jacob Quinn 1956-08-31 [id number]  Ortho Devices Ortho Device/Splint Location: foot roll Ortho Device/Splint Interventions: Application   Post Interventions Patient Tolerated: Well Instructions Provided: Care of device   [place] Pink 11/19/2017, 3:56 PM

## 2017-11-19 NOTE — Anesthesia Preprocedure Evaluation (Addendum)
Anesthesia Evaluation  Patient identified by MRN, date of birth, ID band Patient awake    Reviewed: Allergy & Precautions, H&P , Patient's Chart, lab work & pertinent test results  Airway Mallampati: II  TM Distance: >3 FB Neck ROM: full    Dental no notable dental hx.    Pulmonary    Pulmonary exam normal breath sounds clear to auscultation       Cardiovascular Exercise Tolerance: Good hypertension,  Rhythm:regular Rate:Normal     Neuro/Psych    GI/Hepatic   Endo/Other    Renal/GU      Musculoskeletal   Abdominal   Peds  Hematology   Anesthesia Other Findings CAD: The left ventricular ejection fraction is mildly decreased (45-54%).  Nuclear stress EF: 46%.  There was no ST segment deviation noted during stress.  This is a low risk study. HTN MO     Reproductive/Obstetrics                            Anesthesia Physical Anesthesia Plan  ASA: III  Anesthesia Plan: Spinal   Post-op Pain Management:    Induction:   PONV Risk Score and Plan: 1 and Treatment may vary due to age or medical condition, Dexamethasone and Ondansetron  Airway Management Planned:   Additional Equipment:   Intra-op Plan:   Post-operative Plan:   Informed Consent: I have reviewed the patients History and Physical, chart, labs and discussed the procedure including the risks, benefits and alternatives for the proposed anesthesia with the patient or authorized representative who has indicated his/her understanding and acceptance.     Plan Discussed with:   Anesthesia Plan Comments: (  )        Anesthesia Quick Evaluation

## 2017-11-19 NOTE — Evaluation (Signed)
Physical Therapy Evaluation Patient Details Name: Jacob Quinn MRN: [id number] DOB: 10-03-1956 Today's Date: 11/19/2017   History of Present Illness  Pt s/p lt TKR revision. PMH - Multiple lt knee surgeries including partial knee replacement, arthroscopy, and TKR, Rt partial knee replacement,  OA, NSTEMI, HTN  Clinical Impression  Pt presents to PT with decr mobility due to expected pain and weakness after surgery. Expect pt will make good progress and be able to return home with supportive daughter.    Follow Up Recommendations DC plan and follow up therapy as arranged by surgeon    Equipment Recommendations  Rolling walker with 5" wheels    Recommendations for Other Services       Precautions / Restrictions Precautions Precautions: Knee Required Braces or Orthoses: Knee Immobilizer - Left Knee Immobilizer - Left: Other (comment)(Pt with on in bed on eval. No orders in chart) Restrictions Weight Bearing Restrictions: Yes LLE Weight Bearing: Weight bearing as tolerated      Mobility  Bed Mobility Overal bed mobility: Needs Assistance Bed Mobility: Supine to Sit     Supine to sit: Supervision     General bed mobility comments: Incr time  Transfers Overall transfer level: Needs assistance Equipment used: Rolling walker (2 wheeled) Transfers: Sit to/from Stand Sit to Stand: Min assist         General transfer comment: Assist to bring hips up. Verbal cues for hand placement  Ambulation/Gait Ambulation/Gait assistance: Min assist Ambulation Distance (Feet): 20 Feet Assistive device: Rolling walker (2 wheeled) Gait Pattern/deviations: Step-through pattern;Decreased step length - right;Decreased step length - left;Decreased stance time - left;Trunk flexed Gait velocity: decr Gait velocity interpretation: Below normal speed for age/gender General Gait Details: Assist for safety and support.   Stairs            Wheelchair Mobility    Modified Rankin  (Stroke Patients Only)       Balance Overall balance assessment: No apparent balance deficits (not formally assessed)                                           Pertinent Vitals/Pain Pain Assessment: 0-10 Pain Score: 7  Pain Location: lt knee Pain Descriptors / Indicators: Aching Pain Intervention(s): Limited activity within patient's tolerance;Monitored during session;Repositioned;Ice applied;Premedicated before session    Home Living Family/patient expects to be discharged to:: Private residence Living Arrangements: Children Available Help at Discharge: Family;Available 24 hours/day Type of Home: House Home Access: Stairs to enter Entrance Stairs-Rails: Right Entrance Stairs-Number of Steps: 4-5 Home Layout: One level Home Equipment: Crutches      Prior Function Level of Independence: Independent         Comments: works part time     Journalist, newspaper        Extremity/Trunk Assessment   Upper Extremity Assessment Upper Extremity Assessment: Overall WFL for tasks assessed    Lower Extremity Assessment Lower Extremity Assessment: LLE deficits/detail LLE Deficits / Details: Able to perform straight leg raise with immobilizer on. Didn't remove immobilizer due to unclear orders about immobilizer       Communication   Communication: No difficulties  Cognition Arousal/Alertness: Awake/alert Behavior During Therapy: WFL for tasks assessed/performed Overall Cognitive Status: Within Functional Limits for tasks assessed  General Comments      Exercises     Assessment/Plan    PT Assessment Patient needs continued PT services  PT Problem List Decreased strength;Decreased range of motion;Decreased mobility;Decreased knowledge of use of DME;Obesity;Pain       PT Treatment Interventions DME instruction;Gait training;Stair training;Functional mobility training;Therapeutic  activities;Therapeutic exercise;Patient/family education    PT Goals (Current goals can be found in the Care Plan section)  Acute Rehab PT Goals Patient Stated Goal: return home PT Goal Formulation: With patient/family Time For Goal Achievement: 11/23/17 Potential to Achieve Goals: Good    Frequency 7X/week   Barriers to discharge Inaccessible home environment stairs to enter    Co-evaluation               AM-PAC PT "6 Clicks" Daily Activity  Outcome Measure Difficulty turning over in bed (including adjusting bedclothes, sheets and blankets)?: A Little Difficulty moving from lying on back to sitting on the side of the bed? : A Little Difficulty sitting down on and standing up from a chair with arms (e.g., wheelchair, bedside commode, etc,.)?: Unable Help needed moving to and from a bed to chair (including a wheelchair)?: A Little Help needed walking in hospital room?: A Little Help needed climbing 3-5 steps with a railing? : A Lot 6 Click Score: 15    End of Session Equipment Utilized During Treatment: Gait belt;Left knee immobilizer Activity Tolerance: Patient tolerated treatment well Patient left: in chair;with call bell/phone within reach;with family/visitor present Nurse Communication: Mobility status PT Visit Diagnosis: Other abnormalities of gait and mobility (R26.89);Pain Pain - Right/Left: Left Pain - part of body: Knee    Time: 7035-0093 PT Time Calculation (min) (ACUTE ONLY): 20 min   Charges:   PT Evaluation $PT Eval Low Complexity: 1 Low     PT G CodesMarland Kitchen        [facility] [place] 11/19/2017, 7:18 PM

## 2017-11-19 NOTE — Op Note (Signed)
PATIENT ID:      Jacob Quinn  MRN:     [id number] DOB/AGE:    1955/11/01 / 62 y.o.       OPERATIVE REPORT    DATE OF PROCEDURE:  11/19/2017       PREOPERATIVE DIAGNOSIS:   LOOSE LEFT TOTAL KNEE ARTHROPLASTY      Estimated body mass index is 32.72 kg/m as calculated from the following:   Height as of this encounter: 6\' 1"  (1.854 m).   Weight as of this encounter: 248 lb (112.5 kg).                                                        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  On the tibial side we used a 4 tibia 49 sleeve, 150 x 20 mm tibial stem.  We used a 16 mm rotating-hinge bearing for the medium rotating-hinge femur.  SURGEON: Kerin Salen    ASSISTANT:   Kerry Hough. Barton Dubois  (present throughout entire procedure and necessary for timely completion of the procedure)   ANESTHESIA: Spinal with adductor canal block, Exparel.  DRAINS: none  TOURNIQUET TIME: 60 min Tranexamic acid 2 g IV, 2 g topical Antibiotics: Vancomycin 1 g IV  COMPLICATIONS:  None     SPECIMENS: Synovial tissue and synovial fluid for Gram stain and culture  INDICATIONS FOR PROCEDURE: 62 year old man had a unicompartmental total knee in 2005 converted to a total knee in 2008 revised to a stem total knee later in 2008 and did relatively well for 6 or 7 years.  In 2016 he returned to his primary surgeon having some discomfort and x-rays showed subsidence of the tibial component and possible loosening of the femoral component.  He lived with this for a few more years and he was referred to me in 2018 where radiographs confirmed at least 1 cm subsidence of the tibial component and loosening of the femoral  component.  CBC sed rate and CRP were normal fluid aspirations revealed normal fluid no evidence of infection.  In addition his knee was grossly unstable to medial and lateral varus valgus testing.  In order to give him stability and prevent further subsidence revision of his left total knee was discussed and consented to.  All questions were encouraged and answered.  DESCRIPTION OF PROCEDURE: The patient identified by armband, received  Left abductor canal block and IV antibiotics, in the holding area at [facility]. Patient taken to the operating room, appropriate anesthetic  monitors were attached spinal anesthesia induced with  the patient in supine position,Tourniquet  applied high to the operative thigh. Lateral post and foot positioner x2 applied to the table, the lower extremity was  then prepped and draped  in usual sterile fashion from the ankle to the tourniquet. Time-out procedure was performed. We began the operation by making the anterior midline incision starting at handbreadth above the patella going over the patella 1 cm medial to and  6 cm distal to the tibial tubercle. Small bleeders in the skin and the  subcutaneous tissue identified and cauterized. Transverse retinaculum was incised and reflected medially and a medial parapatellar arthrotomy was accomplished.  There was exuberant synovium once we accomplished the arthrotomy the joint fluid appeared to be clear and specimens were sent off of both the synovium and the fluid.  The synovium appeared to be nonreactive.  After performing a complete synovectomy we confirmed that the tibial component was loose and subsided at least a centimeter.  We then excise scar tissue from around the patellar tendon and laterally allowing [place] to gradually evert the patella once this was accomplished we noted the posterior medially there was a defect in the flare proximally of the tibia about 1 cm below the surface that would require the use of  metaphyseal sleeves.  We did exploit that defect to use 1/4 inch osteotome to tap out the tibial component which actually required a fair amount of force but it did come out with quite a bit of fibrous tissue.  We then evaluated the proximal tibia and except for the posterior medial defect the bone was intact.  We decided to use a long stem of the metaphyseal sleeve at this point.  Under C arm imaging control, we sequentially reamed up to an 18 mm reamer to accept a 150 mm stem on a #4 MBT tray.  We also selected the largest metaphyseal sleeve a 49 which fit into the defect without any subsequent reaming.  At this time we examined the femoral component and found to be loose.  Because of the loose ligaments we elected to go ahead and reconstruct the tibia at this point and put the tourniquet up at 300 mmHg, thoroughly irrigated and dried the tibia and then assembled a 4 MBT tray with a 49 metaphyseal sleeve and a 150 x 18 stem and using proximal mole cement technique cemented this implant into place using a double batch of DePuy HV cement.  After the cement cured the implant was noted to be rocksolid stable.  We then directed our attention to the distal femur we removed loose bits of cement from around the edges and then using a slaphammer and an impactor from the Sigma RP set we were able to remove the TC 3 femur from the metaphyseal sleeve that was already in place.  A small osteotome was then taken around the metaphyseal sleeve, and the sleeve was then removed with a vice grip and slaphammer again removing a large amount of fibrous tissue.  Under C-arm imaging control, we then reamed up to 20 mm for a 150 mm stem on the femoral side and a 46 metaphyseal sleeve trial without difficulty.  Because of ligamentous instability we elected to use a rotating hinge femoral component largest being a medium which was then applied to the trial.  We trialed with a 15 and a 17.5 RP bearing and found good stability with both.  At  this point the femoral canal was thoroughly irrigated out with normal saline dried with suction and sponges and a femoral implant consisting of a medium rotating-hinge DePuy femur, 46 mm sleeve, and 20 x 150 mm stem was assembled at the back table.  Another double batch of DePuy HV cement was mixed applied to the distal mating surfaces and the femoral component hammered into place and the cement allowed to cure.  We then placed a 17.5 mm rotating hinge bearing and inserted the wrist pin completing the reconstruction.  The tourniquet was let down no significant bleeding was noted the wound was thoroughly irrigated out with normal saline solution and soaked in trans-Amick acid sponge.  Final x-rays were taken confirming good position of the implants we then closed in layers with number running #1 Vicryl in the arthrotomy, 2-0 and 3-0 Vicryl in subcutaneous tissue and subcuticular tissues.  Aquacel dressing was applied with an Ace wrap.  Patient was taken to the recovery room without difficulty.  Kerin Salen 11/19/2017, 2:08 PM

## 2017-11-19 NOTE — Discharge Instructions (Addendum)

## 2017-11-19 NOTE — Interval H&P Note (Signed)
History and Physical Interval Note:  11/19/2017 9:15 AM  Jacob Quinn  has presented today for surgery, with the diagnosis of LOOSE LEFT TOTAL KNEE ARTHROPLASTY  The various methods of treatment have been discussed with the patient and family. After consideration of risks, benefits and other options for treatment, the patient has consented to  Procedure(s): LEFT TOTAL KNEE REVISION (Left) as a surgical intervention .  The patient's history has been reviewed, patient examined, no change in status, stable for surgery.  I have reviewed the patient's chart and labs.  Questions were answered to the patient's satisfaction.     Kerin Salen

## 2017-11-19 NOTE — Anesthesia Procedure Notes (Signed)
Spinal  Patient location during procedure: OR Staffing Anesthesiologist: Lyndle Herrlich, MD Spinal Block Patient position: sitting Prep: DuraPrep Patient monitoring: heart rate, blood pressure and continuous pulse ox Approach: right paramedian Location: L3-4 Injection technique: single-shot Needle Needle type: Sprotte  Needle gauge: 24 G Needle length: 9 cm Assessment Sensory level: T4 Additional Notes Spinal Dosage in OR  .75% Bupivicaine ml       1.9 LLD x 3 min

## 2017-11-20 ENCOUNTER — Other Ambulatory Visit: Payer: Self-pay

## 2017-11-20 LAB — BASIC METABOLIC PANEL
ANION GAP: 11 (ref 5–15)
BUN: 12 mg/dL (ref 6–20)
CALCIUM: 8.7 mg/dL — AB (ref 8.9–10.3)
CO2: 24 mmol/L (ref 22–32)
Chloride: 101 mmol/L (ref 101–111)
Creatinine, Ser: 1.13 mg/dL (ref 0.61–1.24)
GFR calc Af Amer: >60 mL/min (ref >60)
GFR calc non Af Amer: >60 mL/min (ref >60)
GLUCOSE: 127 mg/dL — AB (ref 65–99)
Potassium: 4.3 mmol/L (ref 3.5–5.1)
Sodium: 136 mmol/L (ref 135–145)

## 2017-11-20 LAB — CBC
HEMATOCRIT: 33.5 % — AB (ref 39.0–52.0)
Hemoglobin: 10.8 g/dL — ABNORMAL LOW (ref 13.0–17.0)
MCH: 28.8 pg (ref 26.0–34.0)
MCHC: 32.2 g/dL (ref 30.0–36.0)
MCV: 89.3 fL (ref 78.0–100.0)
Platelets: 269 10*3/uL (ref 150–400)
RBC: 3.75 MIL/uL — ABNORMAL LOW (ref 4.22–5.81)
RDW: 13.5 % (ref 11.5–15.5)
WBC: 19.6 10*3/uL — AB (ref 4.0–10.5)

## 2017-11-20 NOTE — Progress Notes (Signed)
Orthopedic Tech Progress Note Patient Details:  REIGN BARTNICK [birth year] [id number]  Ortho Devices Type of Ortho Device: Knee Immobilizer Ortho Device/Splint Location: Left knee/left leg Ortho Device/Splint Interventions: Application, Adjustment   Post Interventions Patient Tolerated: Well Instructions Provided: Adjustment of device, Care of device   Boyce, Keltner 11/20/2017, 1:41 PM

## 2017-11-20 NOTE — Care Management Note (Signed)
Case Management Note  Patient Details  Name: Jacob Quinn MRN: [id number] Date of Birth: [birth year]  Subjective/Objective:    62 yr old gentleman s/p left total knee arthroplasty.                 Action/Plan: Case manager spoke with patient concerning discharge plan and DME. Patient was preoperatively setup with [facility], no changes. He has 3in1, CM has requested RW. Patient says his daughter will assist him at discharge.    Expected Discharge Date:    11/20/17              Expected Discharge Plan:  [place]  In-House Referral:  NA  Discharge planning Services  CM Consult  Post Acute Care Choice:  Home Health, Durable Medical Equipment Choice offered to:  Patient  DME Arranged:  Walker rolling(has 3in1) DME Agency:  [place]:  PT [place] Agency:  [facility] (formerly [facility])  Status of Service:  Completed, signed off  If discussed at [employer] of Stay Meetings, dates discussed:    Additional Comments:  Ninfa Meeker, RN 11/20/2017, 1:50 PM

## 2017-11-20 NOTE — Progress Notes (Signed)
PATIENT ID: OCTAVIS SHEELER  MRN: [id number]  DOB/AGE:  [birth year] / 62 y.o.  1 Day Post-Op Procedure(s) (LRB): LEFT TOTAL KNEE REVISION (Left)    PROGRESS NOTE Subjective: Patient is alert, oriented, No Nausea, No Vomiting, yes passing gas. Taking PO Well. Denies SOB, Chest or Calf Pain. Using Incentive Spirometer, PAS in place. Ambulate 20 feet, Patient reports pain as 1/10 .    Objective: Vital signs in last 24 hours: Vitals:   11/19/17 1845 11/19/17 2040 11/19/17 2300 11/20/17 0336  BP: 133/89 (!) 143/92 (!) 130/94 (!) 143/72  Pulse:  65 63 68  Resp:  20 18 18   Temp:  98.2 F (36.8 C) 97.9 F (36.6 C) 97.6 F (36.4 C)  TempSrc:  Oral Oral Oral  SpO2:  97% 96% 98%  Weight:      Height:          Intake/Output from previous day: I/O last 3 completed shifts: In: 2645 [I.V.:2425; IV Piggyback:220] Out: 0321 [Urine:1850; Blood:700]   Intake/Output this shift: No intake/output data recorded.   LABORATORY DATA: No results for input(s): WBC, HGB, HCT, PLT, NA, K, CL, CO2, BUN, CREATININE, GLUCOSE, GLUCAP, INR, CALCIUM in the last 72 hours.  Invalid input(s): PT, 2  Examination: Neurologically intact ABD soft Neurovascular intact Sensation intact distally Intact pulses distally Dorsiflexion/Plantar flexion intact Incision: moderate drainage No cellulitis present Compartment soft} Because of drainage.  The Aquasol dressing was removed, I was able to milk old blood from the wound and placed a new dressing of 4 x 4's and an Ace wrap.  The wound itself appeared benign. Assessment:   1 Day Post-Op Procedure(s) (LRB): LEFT TOTAL KNEE REVISION (Left) ADDITIONAL DIAGNOSIS: Expected Acute Blood Loss Anemia, History of non-STEMI infarction, hypertension, Ischemic cardiomyopathy, coronary artery disease.  Plan: PT/OT WBAT, AROM and PROM,Patient will discontinue the use of immobilizer today. DVT Prophylaxis:  SCDx72hrs, ASA 325 mg BID x 2 weeks DISCHARGE PLAN: HomeWhen passes  all PT goals. DISCHARGE NEEDS: HHPT, Walker and 3-in-1 comode seat     Kerin Salen 11/20/2017, 8:02 AM

## 2017-11-20 NOTE — Progress Notes (Signed)
Physical Therapy Treatment Patient Details Name: Jacob Quinn MRN: [id number] DOB: 1956/07/13 Today's Date: 11/20/2017    History of Present Illness Pt s/p lt TKR revision. PMH - Multiple lt knee surgeries including partial knee replacement, arthroscopy, and TKR, Rt partial knee replacement,  OA, NSTEMI, HTN    PT Comments    Patient is making good progress with PT.  From a mobility standpoint anticipate patient will be ready for DC home when medically ready.    Follow Up Recommendations  DC plan and follow up therapy as arranged by surgeon     Equipment Recommendations  Rolling walker with 5" wheels    Recommendations for Other Services       Precautions / Restrictions Precautions Precautions: Knee Precaution Comments: precautions reviewed with pt Knee Immobilizer - Left: Other (comment)(KI d/c'd 1/22) Restrictions Weight Bearing Restrictions: Yes LLE Weight Bearing: Weight bearing as tolerated    Mobility  Bed Mobility Overal bed mobility: Independent Bed Mobility: Supine to Sit              Transfers Overall transfer level: Modified independent Equipment used: Rolling walker (2 wheeled) Transfers: Sit to/from [employer] transfer comment: safe hand placement demonstrated; use of RW upon standing  Ambulation/Gait Ambulation/Gait assistance: Supervision Ambulation Distance (Feet): 160 Feet Assistive device: Rolling walker (2 wheeled) Gait Pattern/deviations: Step-through pattern;Decreased stance time - left;Trunk flexed Gait velocity: decr   General Gait Details: RW adjusted which improved pt's trunk flexion; cues for sequencing and posture   Stairs Stairs: Yes   Stair Management: Two rails;Step to pattern;Forwards Number of Stairs: 2 General stair comments: cues for sequencing and technique; supervision for safety  Wheelchair Mobility    Modified Rankin (Stroke Patients Only)       Balance Overall balance assessment: No  apparent balance deficits (not formally assessed)                                          Cognition Arousal/Alertness: Awake/alert Behavior During Therapy: WFL for tasks assessed/performed Overall Cognitive Status: Within Functional Limits for tasks assessed                                        Exercises      General Comments General comments (skin integrity, edema, etc.): daughter present throughout session       Pertinent Vitals/Pain Pain Assessment: 0-10 Pain Score: 5  Pain Location: L knee Pain Descriptors / Indicators: Sore Pain Intervention(s): Monitored during session;Repositioned    Home Living                      Prior Function            PT Goals (current goals can now be found in the care plan section) Acute Rehab PT Goals Patient Stated Goal: return home PT Goal Formulation: With patient/family Time For Goal Achievement: 11/23/17 Potential to Achieve Goals: Good Progress towards PT goals: Progressing toward goals    Frequency    7X/week      PT Plan Current plan remains appropriate    Co-evaluation              AM-PAC PT "6 Clicks" Daily Activity  Outcome Measure  Difficulty turning  over in bed (including adjusting bedclothes, sheets and blankets)?: None Difficulty moving from lying on back to sitting on the side of the bed? : None Difficulty sitting down on and standing up from a chair with arms (e.g., wheelchair, bedside commode, etc,.)?: A Lot Help needed moving to and from a bed to chair (including a wheelchair)?: None Help needed walking in hospital room?: A Little Help needed climbing 3-5 steps with a railing? : A Little 6 Click Score: 20    End of Session Equipment Utilized During Treatment: Gait belt Activity Tolerance: Patient tolerated treatment well Patient left: in chair;with call bell/phone within reach;with family/visitor present Nurse Communication: Mobility status PT  Visit Diagnosis: Other abnormalities of gait and mobility (R26.89);Pain Pain - Right/Left: Left Pain - part of body: Knee     Time: 3734-2876 PT Time Calculation (min) (ACUTE ONLY): 17 min  Charges:  $Gait Training: 8-22 mins                    G Codes:       Earney Navy, PTA Pager: [phone]     Darliss Cheney 11/20/2017, 9:37 AM

## 2017-11-20 NOTE — Discharge Summary (Signed)
Patient ID: Jacob Quinn MRN: [id number] DOB/AGE: [birth year] 62 y.o.  Admit date: 11/19/2017 Discharge date: 11/20/2017  Admission Diagnoses:  Principal Problem:   Failure of total knee arthroplasty [facility]) Active Problems:   Primary osteoarthritis of left knee   Discharge Diagnoses:  Same  Past Medical History:  Diagnosis Date  . BPH associated with nocturia   . CAD in native artery    a. NSTEMI 07/2016 -  LHC 08/07/16 showing 70% D1, 20% mLAD, segmental 95% then 75% OM stenosis, 85% acute marg, normal LVEDP - received PTCA/DES to OM.  Marland Kitchen Encephalitis, viral   . Hyperlipidemia 08/07/2016  . Hypertension   . Ischemic cardiomyopathy    a. LHC 07/2016 - mild mid anterolateral focal hypocontractility felt to be due to the circumflex stenosis, EF 50%.  . OA (osteoarthritis)   . Obesity (BMI 30-39.9) 08/07/2016  . Renal insufficiency    a. 2016 r/t dehydration/ACEI use.  Marland Kitchen Restless leg syndrome 09/16/2010  . Rheumatic fever   . Syncope 2009   associated with viral URI    Surgeries: Procedure(s): LEFT TOTAL KNEE REVISION on 11/19/2017   Consultants:   Discharged Condition: Improved  Hospital Course: Jacob Quinn is an 62 y.o. male who was admitted 11/19/2017 for operative treatment ofFailure of total knee arthroplasty ([place]). Patient has severe unremitting pain that affects sleep, daily activities, and work/hobbies. After pre-op clearance the patient was taken to the operating room on 11/19/2017 and underwent  Procedure(s): LEFT TOTAL KNEE REVISION.    Patient was given perioperative antibiotics:  Anti-infectives (From admission, onward)   Start     Dose/Rate Route Frequency Ordered Stop   11/19/17 1101  cefUROXime (ZINACEF) injection  Status:  Discontinued       As needed 11/19/17 1102 11/19/17 1444   11/19/17 0700  vancomycin (VANCOCIN) 1,500 mg in sodium chloride 0.9 % 500 mL IVPB     1,500 mg 250 mL/hr over 120 Minutes Intravenous On call to O.R. 11/16/17 1322  11/19/17 1026       Patient was given sequential compression devices, early ambulation, and chemoprophylaxis to prevent DVT.  Patient benefited maximally from hospital stay and there were no complications.    Recent vital signs:  Patient Vitals for the past 24 hrs:  BP Temp Temp src Pulse Resp SpO2  11/20/17 0336 (!) 143/72 97.6 F (36.4 C) Oral 68 18 98 %  11/19/17 2300 (!) 130/94 97.9 F (36.6 C) Oral 63 18 96 %  11/19/17 2040 (!) 143/92 98.2 F (36.8 C) Oral 65 20 97 %  11/19/17 1845 133/89 - - - - -  11/19/17 1641 133/89 (!) 97.5 F (36.4 C) Oral 60 - 97 %     Recent laboratory studies:  Recent Labs    11/20/17 0746  WBC 19.6*  HGB 10.8*  HCT 33.5*  PLT 269  NA 136  K 4.3  CL 101  CO2 24  BUN 12  CREATININE 1.13  GLUCOSE 127*  CALCIUM 8.7*     Discharge Medications:   Allergies as of 11/20/2017      Reactions   Penicillins Anaphylaxis   Has patient had a PCN reaction causing immediate rash, facial/tongue/throat swelling, SOB or lightheadedness with hypotension: Yes Has patient had a PCN reaction causing severe rash involving mucus membranes or skin necrosis: No Has patient had a PCN reaction that required hospitalization: No Has patient had a PCN reaction occurring within the last 10 years: No If all of the above answers are "  NO", then may proceed with Cephalosporin use.      Medication List    STOP taking these medications   ibuprofen 800 MG tablet Commonly known as:  ADVIL,MOTRIN   Ibuprofen-Famotidine 800-26.6 MG Tabs Commonly known as:  DUEXIS     TAKE these medications   amLODipine 10 MG tablet Commonly known as:  NORVASC TAKE 1 TABLET (10 MG TOTAL) BY MOUTH DAILY.   aspirin EC 325 MG tablet Take 1 tablet (325 mg total) by mouth 2 (two) times daily. What changed:    medication strength  how much to take  when to take this   atorvastatin 80 MG tablet Commonly known as:  LIPITOR TAKE 1 TABLET (80 MG TOTAL) BY MOUTH EVERY EVENING.    Evolocumab 140 MG/ML Soaj Commonly known as:  REPATHA SURECLICK Inject 542 mg into the skin every 14 (fourteen) days.   ezetimibe 10 MG tablet Commonly known as:  ZETIA Take 1 tablet (10 mg total) by mouth daily.   lisinopril 40 MG tablet Commonly known as:  PRINIVIL,ZESTRIL Take 1 tablet (40 mg total) by mouth daily.   metoprolol succinate 25 MG 24 hr tablet Commonly known as:  TOPROL XL Take 1 tablet (25 mg total) by mouth daily.   MULTI VITAMIN DAILY Tabs Take 1 tablet by mouth daily.   nitroGLYCERIN 0.4 MG SL tablet Commonly known as:  NITROSTAT Place 1 tablet (0.4 mg total) under the tongue every 5 (five) minutes as needed for chest pain. Up to 3 doses.   oxyCODONE-acetaminophen 5-325 MG tablet Commonly known as:  PERCOCET/ROXICET Take 1 tablet by mouth every 4 (four) hours as needed for severe pain.   ticagrelor 90 MG Tabs tablet Commonly known as:  BRILINTA Take 1 tablet (90 mg total) by mouth every 12 (twelve) hours.   tiZANidine 2 MG tablet Commonly known as:  ZANAFLEX Take 1 tablet (2 mg total) by mouth every 6 (six) hours as needed for muscle spasms.   traMADol 50 MG tablet Commonly known as:  ULTRAM Take 1 tablet (50 mg total) by mouth every 8 (eight) hours as needed.            Durable Medical Equipment  (From admission, onward)        Start     Ordered   11/20/17 1337  For home use only DME Walker rolling  Once    Question:  Patient needs a walker to treat with the following condition  Answer:  S/P total knee arthroplasty, left   11/20/17 1337   11/19/17 1659  DME Walker rolling  Once    Question:  Patient needs a walker to treat with the following condition  Answer:  Status post total left knee replacement   11/19/17 1658   11/19/17 1659  DME 3 n 1  Once     11/19/17 1658   11/19/17 1659  DME Bedside commode  Once    Question:  Patient needs a bedside commode to treat with the following condition  Answer:  Status post total left knee  replacement   11/19/17 1658      Diagnostic Studies: Dg Chest 2 View  Result Date: 11/14/2017 CLINICAL DATA:  Preoperative examination prior to knee replacement. History of hypertension, coronary artery disease with stent placement, previous MI, history of rheumatic fever, never smoked. EXAM: CHEST  2 VIEW COMPARISON:  Chest x-ray of August 07, 2016 FINDINGS: The lungs are well-expanded and clear. The heart and pulmonary vascularity are normal. The mediastinum is  normal in width. The trachea is midline. The bony thorax is unremarkable. IMPRESSION: There is no pneumonia nor other acute cardiopulmonary abnormality. Electronically Signed   By: David  Martinique M.D.   On: 11/14/2017 12:48   Dg Knee Complete 4 Views Left  Result Date: 11/19/2017 CLINICAL DATA:  Status post left total knee joint revision. EXAM: DG C-ARM 61-120 MIN; LEFT KNEE - COMPLETE 4+ VIEW COMPARISON:  CT scan of the left knee of August 27, 2017 FINDINGS: Six fluoro spot radiographs are reviewed. The fluoro time reported is 40 seconds. The patient has undergone left total knee prosthesis removal and replacement. Radiographic positioning of the new prosthetic components is good. The interface with the native bone appears normal. IMPRESSION: No immediate postprocedure complication following revision of a left total knee joint prosthesis. Electronically Signed   By: David  Martinique M.D.   On: 11/19/2017 14:17   Dg C-arm 1-60 Min  Result Date: 11/19/2017 CLINICAL DATA:  Status post left total knee joint revision. EXAM: DG C-ARM 61-120 MIN; LEFT KNEE - COMPLETE 4+ VIEW COMPARISON:  CT scan of the left knee of August 27, 2017 FINDINGS: Six fluoro spot radiographs are reviewed. The fluoro time reported is 40 seconds. The patient has undergone left total knee prosthesis removal and replacement. Radiographic positioning of the new prosthetic components is good. The interface with the native bone appears normal. IMPRESSION: No immediate  postprocedure complication following revision of a left total knee joint prosthesis. Electronically Signed   By: David  Martinique M.D.   On: 11/19/2017 14:17   Dg C-arm 1-60 Min  Result Date: 11/19/2017 CLINICAL DATA:  Status post left total knee joint revision. EXAM: DG C-ARM 61-120 MIN; LEFT KNEE - COMPLETE 4+ VIEW COMPARISON:  CT scan of the left knee of August 27, 2017 FINDINGS: Six fluoro spot radiographs are reviewed. The fluoro time reported is 40 seconds. The patient has undergone left total knee prosthesis removal and replacement. Radiographic positioning of the new prosthetic components is good. The interface with the native bone appears normal. IMPRESSION: No immediate postprocedure complication following revision of a left total knee joint prosthesis. Electronically Signed   By: David  Martinique M.D.   On: 11/19/2017 14:17    Disposition: 01-Home or Self Care  Discharge Instructions    Call MD / Call 911   Complete by:  As directed    If you experience chest pain or shortness of breath, CALL 911 and be transported to the hospital emergency room.  If you develope a fever above 101 F, pus (white drainage) or increased drainage or redness at the wound, or calf pain, call your surgeon's office.   Constipation Prevention   Complete by:  As directed    Drink plenty of fluids.  Prune juice may be helpful.  You may use a stool softener, such as Colace (over the counter) 100 mg twice a day.  Use MiraLax (over the counter) for constipation as needed.   Diet - low sodium heart healthy   Complete by:  As directed    Driving restrictions   Complete by:  As directed    No driving for 2 weeks   Increase activity slowly as tolerated   Complete by:  As directed    Patient may shower   Complete by:  As directed    You may shower without a dressing once there is no drainage.  Do not wash over the wound.  If drainage remains, cover wound with plastic wrap and then shower.  Follow-up Information     Frederik Pear, MD Follow up in 2 week(s).   Specialty:  Orthopedic Surgery Contact information: [place] [address] [phone]        Home, [facility] Follow up.   Specialty:  [place] Why:  A representative from [facility] will contact you to arrange start date and time for your therapy. Contact information: [address] [place] [address] [phone]            Signed: Joanell Rising 11/20/2017, 4:40 PM

## 2017-11-20 NOTE — Evaluation (Signed)
Occupational Therapy Evaluation Patient Details Name: Jacob Quinn MRN: [id number] DOB: 1956/06/13 Today's Date: 11/20/2017    History of Present Illness Pt s/p lt TKR revision. PMH - Multiple lt knee surgeries including partial knee replacement, arthroscopy, and TKR, Rt partial knee replacement,  OA, NSTEMI, HTN   Clinical Impression   Pt is at min guard A level with ADLs and sup - Mod I with ADL mobility using RW. Pt has DME and A/E from previous knee surgeries and will have assist at home for family. All education completed and no further acute OT is indicated at this time    Follow Up Recommendations  DC plan and follow up therapy as arranged by surgeon    Equipment Recommendations  None recommended by OT    Recommendations for Other Services       Precautions / Restrictions Precautions Precautions: Knee Precaution Comments: precautions reviewed with pt Knee Immobilizer - Left: Other (comment)(KI d/c'd 1/22) Restrictions Weight Bearing Restrictions: Yes LLE Weight Bearing: Weight bearing as tolerated      Mobility Bed Mobility Overal bed mobility: Independent Bed Mobility: Supine to Sit;Sit to Supine     Supine to sit: Supervision Sit to supine: Supervision      Transfers Overall transfer level: Modified independent Equipment used: Rolling walker (2 wheeled) Transfers: Sit to/from Stand Sit to Stand: Min guard         General transfer comment: safe hand placement demonstrated; use of RW upon standing    Balance Overall balance assessment: No apparent balance deficits (not formally assessed)                                         ADL either performed or assessed with clinical judgement   ADL Overall ADL's : Needs assistance/impaired     Grooming: Wash/dry hands;Wash/dry face;Standing;Min guard;With caregiver independent assisting   Upper Body Bathing: Set up;With caregiver independent assisting   Lower Body Bathing: Min  guard;With caregiver independent assisting   Upper Body Dressing : Set up;With caregiver independent assisting   Lower Body Dressing: Min guard;With caregiver independent assisting   Toilet Transfer: Supervision/safety;With caregiver independent assisting;Ambulation;RW   Toileting- Water quality scientist and Hygiene: Min guard;Sit to/from stand;With caregiver independent assisting   Tub/ Shower Transfer: Supervision/safety;Ambulation;Rolling walker;3 in 1;With caregiver independent assisting   Functional mobility during ADLs: Min guard;Modified independent;Rolling walker General ADL Comments: pt has all necessary DME and A/E for home use from previous knee surgeries     Vision Baseline Vision/History: Wears glasses Patient Visual Report: No change from baseline       Perception     Praxis      Pertinent Vitals/Pain Pain Assessment: 0-10 Pain Score: 4  Pain Location: L knee Pain Descriptors / Indicators: Sore Pain Intervention(s): Monitored during session;Premedicated before session;Repositioned     Hand Dominance Right   Extremity/Trunk Assessment Upper Extremity Assessment Upper Extremity Assessment: Overall WFL for tasks assessed   Lower Extremity Assessment Lower Extremity Assessment: Defer to PT evaluation   Cervical / Trunk Assessment Cervical / Trunk Assessment: Normal   Communication Communication Communication: No difficulties   Cognition Arousal/Alertness: Awake/alert Behavior During Therapy: WFL for tasks assessed/performed Overall Cognitive Status: Within Functional Limits for tasks assessed  General Comments  daughter present throughout session     Exercises     Shoulder Instructions      [name] expects to be discharged to:: Private residence Living Arrangements: Children Available Help at Discharge: Family;Available 24 hours/day Type of Home: House Home Access: Stairs to  enter [employer] of Steps: 5 Entrance Stairs-Rails: Right Home Layout: One level     Bathroom Shower/Tub: Tub/shower unit;Walk-in shower   Bathroom Toilet: Standard     Home Equipment: Crutches          Prior Functioning/Environment Level of Independence: Independent        Comments: works part time        OT Problem List: Decreased activity tolerance;Impaired balance (sitting and/or standing);Pain      OT Treatment/Interventions:      OT Goals(Current goals can be found in the care plan section) Acute Rehab OT Goals Patient Stated Goal: return home OT Goal Formulation: With patient/family  OT Frequency:     Barriers to D/C:    no barriers, will have assist from family at home       Co-evaluation              AM-PAC PT "6 Clicks" Daily Activity     Outcome Measure Help from another person eating meals?: None Help from another person taking care of personal grooming?: A Little Help from another person toileting, which includes using toliet, bedpan, or urinal?: A Little Help from another person bathing (including washing, rinsing, drying)?: A Little Help from another person to put on and taking off regular upper body clothing?: None Help from another person to put on and taking off regular lower body clothing?: A Little 6 Click Score: 20   End of Session Equipment Utilized During Treatment: Gait belt;Rolling walker  Activity Tolerance: Patient tolerated treatment well Patient left: in bed;with call bell/phone within reach  OT Visit Diagnosis: Pain;Unsteadiness on feet (R26.81) Pain - Right/Left: Left Pain - part of body: Knee                Time: 1010-1049 OT Time Calculation (min): 39 min Charges:  OT General Charges $OT Visit: 1 Visit OT Evaluation $OT Eval Low Complexity: 1 Low OT Treatments $Self Care/Home Management : 8-22 mins $Therapeutic Activity: 8-22 mins G-Codes: OT G-codes **NOT FOR INPATIENT CLASS** Functional  Assessment Tool Used: AM-PAC 6 Clicks Daily Activity     Britt Bottom 11/20/2017, 12:55 PM

## 2017-11-20 NOTE — Progress Notes (Addendum)
PATIENT ID: Jacob Quinn  MRN: [id number]  DOB/AGE:  64-Dec-1957 / 64 y.o.  1 Day Post-Op Procedure(s) (LRB): LEFT TOTAL KNEE REVISION (Left)    PROGRESS NOTE  I examined Pt's knee and there is no current bleeding.  He is cautioned on doing too much too soon.  If pt has no more bleeding issues we discussed the possibility of discharge later this afternoon.  He was provided with extra ace bandage, 4x4's and abd pad.  We will also d/c his brilinta as this could be contributing to his post op hemmorage. Pt was asked to talk to his cardiologist or prescriber of the medication as to when it would be safe to restart the medication.     Joanell Rising 11/20/2017, 12:22 PM

## 2017-11-21 ENCOUNTER — Encounter (HOSPITAL_COMMUNITY): Payer: Self-pay | Admitting: Orthopedic Surgery

## 2017-11-22 ENCOUNTER — Emergency Department (HOSPITAL_COMMUNITY): Payer: BLUE CROSS/BLUE SHIELD

## 2017-11-22 ENCOUNTER — Other Ambulatory Visit: Payer: Self-pay

## 2017-11-22 ENCOUNTER — Encounter (HOSPITAL_COMMUNITY): Payer: Self-pay | Admitting: Emergency Medicine

## 2017-11-22 ENCOUNTER — Inpatient Hospital Stay (HOSPITAL_COMMUNITY)
Admission: EM | Admit: 2017-11-22 | Discharge: 2017-11-24 | DRG: 872 | Disposition: A | Discharge status: Home / Self Care | Payer: BLUE CROSS/BLUE SHIELD | Attending: Family Medicine | Admitting: Family Medicine

## 2017-11-22 DIAGNOSIS — Z7902 Long term (current) use of antithrombotics/antiplatelets: Secondary | ICD-10-CM

## 2017-11-22 DIAGNOSIS — G2581 Restless legs syndrome: Secondary | ICD-10-CM | POA: Diagnosis present

## 2017-11-22 DIAGNOSIS — Z888 Allergy status to other drugs, medicaments and biological substances status: Secondary | ICD-10-CM

## 2017-11-22 DIAGNOSIS — A419 Sepsis, unspecified organism: Secondary | ICD-10-CM

## 2017-11-22 DIAGNOSIS — M1990 Unspecified osteoarthritis, unspecified site: Secondary | ICD-10-CM | POA: Diagnosis present

## 2017-11-22 DIAGNOSIS — M25062 Hemarthrosis, left knee: Secondary | ICD-10-CM | POA: Diagnosis present

## 2017-11-22 DIAGNOSIS — N401 Enlarged prostate with lower urinary tract symptoms: Secondary | ICD-10-CM | POA: Diagnosis present

## 2017-11-22 DIAGNOSIS — Z8249 Family history of ischemic heart disease and other diseases of the circulatory system: Secondary | ICD-10-CM | POA: Diagnosis not present

## 2017-11-22 DIAGNOSIS — N390 Urinary tract infection, site not specified: Secondary | ICD-10-CM | POA: Diagnosis present

## 2017-11-22 DIAGNOSIS — N3000 Acute cystitis without hematuria: Secondary | ICD-10-CM | POA: Diagnosis not present

## 2017-11-22 DIAGNOSIS — E7800 Pure hypercholesterolemia, unspecified: Secondary | ICD-10-CM | POA: Diagnosis present

## 2017-11-22 DIAGNOSIS — Z96652 Presence of left artificial knee joint: Secondary | ICD-10-CM | POA: Diagnosis present

## 2017-11-22 DIAGNOSIS — I129 Hypertensive chronic kidney disease with stage 1 through stage 4 chronic kidney disease, or unspecified chronic kidney disease: Secondary | ICD-10-CM | POA: Diagnosis present

## 2017-11-22 DIAGNOSIS — Z8042 Family history of malignant neoplasm of prostate: Secondary | ICD-10-CM

## 2017-11-22 DIAGNOSIS — R351 Nocturia: Secondary | ICD-10-CM | POA: Diagnosis present

## 2017-11-22 DIAGNOSIS — Z955 Presence of coronary angioplasty implant and graft: Secondary | ICD-10-CM | POA: Diagnosis not present

## 2017-11-22 DIAGNOSIS — I10 Essential (primary) hypertension: Secondary | ICD-10-CM

## 2017-11-22 DIAGNOSIS — I252 Old myocardial infarction: Secondary | ICD-10-CM | POA: Diagnosis not present

## 2017-11-22 DIAGNOSIS — Z823 Family history of stroke: Secondary | ICD-10-CM

## 2017-11-22 DIAGNOSIS — A4151 Sepsis due to Escherichia coli [E. coli]: Secondary | ICD-10-CM | POA: Diagnosis not present

## 2017-11-22 DIAGNOSIS — B9620 Unspecified Escherichia coli [E. coli] as the cause of diseases classified elsewhere: Secondary | ICD-10-CM | POA: Diagnosis present

## 2017-11-22 DIAGNOSIS — N183 Chronic kidney disease, stage 3 (moderate): Secondary | ICD-10-CM | POA: Diagnosis present

## 2017-11-22 DIAGNOSIS — E785 Hyperlipidemia, unspecified: Secondary | ICD-10-CM

## 2017-11-22 DIAGNOSIS — Z818 Family history of other mental and behavioral disorders: Secondary | ICD-10-CM | POA: Diagnosis not present

## 2017-11-22 DIAGNOSIS — Z880 Allergy status to penicillin: Secondary | ICD-10-CM | POA: Diagnosis not present

## 2017-11-22 DIAGNOSIS — T8140XA Infection following a procedure, unspecified, initial encounter: Secondary | ICD-10-CM | POA: Diagnosis present

## 2017-11-22 DIAGNOSIS — R509 Fever, unspecified: Secondary | ICD-10-CM | POA: Diagnosis not present

## 2017-11-22 DIAGNOSIS — I255 Ischemic cardiomyopathy: Secondary | ICD-10-CM | POA: Diagnosis present

## 2017-11-22 DIAGNOSIS — I2510 Atherosclerotic heart disease of native coronary artery without angina pectoris: Secondary | ICD-10-CM | POA: Diagnosis present

## 2017-11-22 HISTORY — DX: Atrioventricular block, first degree: I44.0

## 2017-11-22 LAB — URINALYSIS, ROUTINE W REFLEX MICROSCOPIC
Bilirubin Urine: NEGATIVE
GLUCOSE, UA: NEGATIVE mg/dL
Ketones, ur: NEGATIVE mg/dL
Nitrite: POSITIVE — AB
PH: 8 (ref 5.0–8.0)
PROTEIN: 30 mg/dL — AB
Specific Gravity, Urine: 1.014 (ref 1.005–1.030)

## 2017-11-22 LAB — COMPREHENSIVE METABOLIC PANEL
ALK PHOS: 91 U/L (ref 38–126)
ALT: 20 U/L (ref 17–63)
ANION GAP: 12 (ref 5–15)
AST: 25 U/L (ref 15–41)
Albumin: 3.3 g/dL — ABNORMAL LOW (ref 3.5–5.0)
BILIRUBIN TOTAL: 0.8 mg/dL (ref 0.3–1.2)
BUN: 12 mg/dL (ref 6–20)
CALCIUM: 8.7 mg/dL — AB (ref 8.9–10.3)
CO2: 24 mmol/L (ref 22–32)
CREATININE: 1.33 mg/dL — AB (ref 0.61–1.24)
Chloride: 97 mmol/L — ABNORMAL LOW (ref 101–111)
GFR calc Af Amer: >60 mL/min (ref >60)
GFR calc non Af Amer: 55 mL/min — ABNORMAL LOW (ref >60)
GLUCOSE: 120 mg/dL — AB (ref 65–99)
Potassium: 4.1 mmol/L (ref 3.5–5.1)
SODIUM: 133 mmol/L — AB (ref 135–145)
TOTAL PROTEIN: 7.2 g/dL (ref 6.5–8.1)

## 2017-11-22 LAB — CBC
HCT: 27.6 % — ABNORMAL LOW (ref 39.0–52.0)
Hemoglobin: 8.8 g/dL — ABNORMAL LOW (ref 13.0–17.0)
MCH: 28.5 pg (ref 26.0–34.0)
MCHC: 31.9 g/dL (ref 30.0–36.0)
MCV: 89.3 fL (ref 78.0–100.0)
PLATELETS: 225 10*3/uL (ref 150–400)
RBC: 3.09 MIL/uL — ABNORMAL LOW (ref 4.22–5.81)
RDW: 13.4 % (ref 11.5–15.5)
WBC: 13.5 10*3/uL — AB (ref 4.0–10.5)

## 2017-11-22 LAB — CBC WITH DIFFERENTIAL/PLATELET
BASOS PCT: 0 %
Basophils Absolute: 0 10*3/uL (ref 0.0–0.1)
EOS ABS: 0 10*3/uL (ref 0.0–0.7)
EOS PCT: 0 %
HCT: 32.2 % — ABNORMAL LOW (ref 39.0–52.0)
Hemoglobin: 10.2 g/dL — ABNORMAL LOW (ref 13.0–17.0)
LYMPHS ABS: 1.3 10*3/uL (ref 0.7–4.0)
Lymphocytes Relative: 9 %
MCH: 28.4 pg (ref 26.0–34.0)
MCHC: 31.7 g/dL (ref 30.0–36.0)
MCV: 89.7 fL (ref 78.0–100.0)
MONO ABS: 1.4 10*3/uL — AB (ref 0.1–1.0)
Monocytes Relative: 9 %
Neutro Abs: 12.8 10*3/uL — ABNORMAL HIGH (ref 1.7–7.7)
Neutrophils Relative %: 82 %
PLATELETS: 266 10*3/uL (ref 150–400)
RBC: 3.59 MIL/uL — ABNORMAL LOW (ref 4.22–5.81)
RDW: 13.4 % (ref 11.5–15.5)
WBC: 15.5 10*3/uL — ABNORMAL HIGH (ref 4.0–10.5)

## 2017-11-22 LAB — I-STAT CG4 LACTIC ACID, ED: Lactic Acid, Venous: 1.59 mmol/L (ref 0.5–1.9)

## 2017-11-22 LAB — CREATININE, SERUM: CREATININE: 1.17 mg/dL (ref 0.61–1.24)

## 2017-11-22 LAB — PROTIME-INR
INR: 1.09
PROTHROMBIN TIME: 14 s (ref 11.4–15.2)

## 2017-11-22 MED ORDER — AMLODIPINE BESYLATE 10 MG PO TABS
10.0000 mg | ORAL_TABLET | Freq: Every day | ORAL | Status: DC
  Administered 2017-11-23 – 2017-11-24 (×2): 10 mg via ORAL
  Filled 2017-11-22 (×2): qty 1

## 2017-11-22 MED ORDER — DEXTROSE 5 % IV SOLN
2.0000 g | Freq: Three times a day (TID) | INTRAVENOUS | Status: DC
  Administered 2017-11-23: 2 g via INTRAVENOUS
  Filled 2017-11-22 (×2): qty 2

## 2017-11-22 MED ORDER — ACETAMINOPHEN 500 MG PO TABS
1000.0000 mg | ORAL_TABLET | Freq: Once | ORAL | Status: AC
  Administered 2017-11-22: 1000 mg via ORAL
  Filled 2017-11-22: qty 2

## 2017-11-22 MED ORDER — ACETAMINOPHEN 650 MG RE SUPP: 650.0000 mg | Freq: Four times a day (QID) | RECTAL | Status: DC | PRN

## 2017-11-22 MED ORDER — AZTREONAM 2 G IJ SOLR
2.0000 g | Freq: Once | INTRAMUSCULAR | Status: AC
  Administered 2017-11-22: 2 g via INTRAVENOUS
  Filled 2017-11-22: qty 2

## 2017-11-22 MED ORDER — HEPARIN SODIUM (PORCINE) 5000 UNIT/ML IJ SOLN: 5000.0000 [IU] | Freq: Three times a day (TID) | INTRAMUSCULAR | Status: DC

## 2017-11-22 MED ORDER — SODIUM CHLORIDE 0.9 % IV BOLUS (SEPSIS)
500.0000 mL | Freq: Once | INTRAVENOUS | Status: AC
  Administered 2017-11-22: 500 mL via INTRAVENOUS

## 2017-11-22 MED ORDER — LEVOFLOXACIN IN D5W 750 MG/150ML IV SOLN: 750.0000 mg | INTRAVENOUS | Status: DC

## 2017-11-22 MED ORDER — KETOROLAC TROMETHAMINE 30 MG/ML IJ SOLN
30.0000 mg | Freq: Once | INTRAMUSCULAR | Status: AC
  Administered 2017-11-22: 30 mg via INTRAVENOUS
  Filled 2017-11-22: qty 1

## 2017-11-22 MED ORDER — ATORVASTATIN CALCIUM 80 MG PO TABS
80.0000 mg | ORAL_TABLET | Freq: Every evening | ORAL | Status: DC
  Administered 2017-11-23: 80 mg via ORAL
  Filled 2017-11-22: qty 1

## 2017-11-22 MED ORDER — HYDRALAZINE HCL 20 MG/ML IJ SOLN: 10.0000 mg | Freq: Three times a day (TID) | INTRAMUSCULAR | Status: DC | PRN

## 2017-11-22 MED ORDER — SODIUM CHLORIDE 0.9 % IV BOLUS (SEPSIS)
1000.0000 mL | Freq: Once | INTRAVENOUS | Status: AC
  Administered 2017-11-22: 1000 mL via INTRAVENOUS

## 2017-11-22 MED ORDER — TICAGRELOR 90 MG PO TABS
90.0000 mg | ORAL_TABLET | Freq: Two times a day (BID) | ORAL | Status: DC
  Filled 2017-11-22 (×3): qty 1

## 2017-11-22 MED ORDER — ONDANSETRON HCL 4 MG PO TABS: 4.0000 mg | ORAL_TABLET | Freq: Four times a day (QID) | ORAL | Status: DC | PRN

## 2017-11-22 MED ORDER — ASPIRIN EC 325 MG PO TBEC
325.0000 mg | DELAYED_RELEASE_TABLET | Freq: Two times a day (BID) | ORAL | Status: DC
  Filled 2017-11-22 (×2): qty 1

## 2017-11-22 MED ORDER — LISINOPRIL 40 MG PO TABS
40.0000 mg | ORAL_TABLET | Freq: Every day | ORAL | Status: DC
  Administered 2017-11-23 – 2017-11-24 (×2): 40 mg via ORAL
  Filled 2017-11-22 (×2): qty 1

## 2017-11-22 MED ORDER — LEVOFLOXACIN IN D5W 750 MG/150ML IV SOLN
750.0000 mg | Freq: Once | INTRAVENOUS | Status: AC
  Administered 2017-11-22: 750 mg via INTRAVENOUS
  Filled 2017-11-22: qty 150

## 2017-11-22 MED ORDER — ONDANSETRON HCL 4 MG/2ML IJ SOLN: 4.0000 mg | Freq: Four times a day (QID) | INTRAMUSCULAR | Status: DC | PRN

## 2017-11-22 MED ORDER — NITROGLYCERIN 0.4 MG SL SUBL: 0.4000 mg | SUBLINGUAL_TABLET | SUBLINGUAL | Status: DC | PRN

## 2017-11-22 MED ORDER — ACETAMINOPHEN 325 MG PO TABS
650.0000 mg | ORAL_TABLET | Freq: Four times a day (QID) | ORAL | Status: DC | PRN
  Administered 2017-11-23 – 2017-11-24 (×3): 650 mg via ORAL
  Filled 2017-11-22 (×4): qty 2

## 2017-11-22 MED ORDER — TRAMADOL HCL 50 MG PO TABS
50.0000 mg | ORAL_TABLET | Freq: Four times a day (QID) | ORAL | Status: DC | PRN
  Administered 2017-11-23 – 2017-11-24 (×3): 50 mg via ORAL
  Filled 2017-11-22 (×3): qty 1

## 2017-11-22 MED ORDER — SPIRONOLACTONE 25 MG PO TABS
25.0000 mg | ORAL_TABLET | Freq: Every day | ORAL | Status: DC
  Administered 2017-11-23 – 2017-11-24 (×2): 25 mg via ORAL
  Filled 2017-11-22 (×2): qty 1

## 2017-11-22 MED ORDER — EZETIMIBE 10 MG PO TABS
10.0000 mg | ORAL_TABLET | Freq: Every day | ORAL | Status: DC
  Administered 2017-11-23 – 2017-11-24 (×2): 10 mg via ORAL
  Filled 2017-11-22 (×2): qty 1

## 2017-11-22 MED ORDER — METOPROLOL SUCCINATE ER 25 MG PO TB24
25.0000 mg | ORAL_TABLET | Freq: Every day | ORAL | Status: DC
  Administered 2017-11-23 – 2017-11-24 (×2): 25 mg via ORAL
  Filled 2017-11-22 (×2): qty 1

## 2017-11-22 NOTE — ED Notes (Signed)
Called by MD Mayer Camel - Stated that patient was seen at his office today for increase fatigue and follow-up after Knee replacement. Pt was reported to take muscle relaxer, become fatigued, and continued to stay that way throughout the day. Pt had increased times going to the bathroom, but MD is afraid he is retaining urine. Potential UTI. Xray of Knee was negative and he pulled fluid off the knee today, but was not concerned for infection. No fever at the office.

## 2017-11-22 NOTE — Progress Notes (Signed)
Pharmacy Antibiotic Note  Jacob Quinn is a 62 y.o. male admitted on 11/22/2017 with sepsis. Pharmacy has been consulted for Levaquin and aztreonam dosing. Pt is febrile at 103F. Elevated WBC at 19.6. Lactate and SCr WNL (calculated CrCl 106 ml/min).  Plan: Levaquin 750mg  IV q24h Aztreonam 2gm IV q8h F/u renal fxn, clinical resolution  Temp (24hrs), Avg:103 F (39.4 C), Min:103 F (39.4 C), Max:103 F (39.4 C)  Recent Labs  Lab 11/20/17 0746 11/22/17 2013  WBC 19.6*  --   CREATININE 1.13  --   LATICACIDVEN  --  1.59    Estimated Creatinine Clearance: 87.9 mL/min (by C-G formula based on SCr of 1.13 mg/dL).    Allergies  Allergen Reactions  . Penicillins Anaphylaxis    Has patient had a PCN reaction causing immediate rash, facial/tongue/throat swelling, SOB or lightheadedness with hypotension: Yes Has patient had a PCN reaction causing severe rash involving mucus membranes or skin necrosis: No Has patient had a PCN reaction that required hospitalization: No Has patient had a PCN reaction occurring within the last 10 years: No If all of the above answers are "NO", then may proceed with Cephalosporin use.     Antimicrobials this admission: Levaquin 1/24 >>  Aztreonam 1/24 >>   Dose adjustments this admission: None  Microbiology results: Pending  Thank you for allowing pharmacy to be a part of this patient's care.  Jacob Quinn 11/22/2017 8:21 PM

## 2017-11-22 NOTE — H&P (Signed)
[facility] History and Physical  WOODFIN KISS BHA:[id number] DOB: 06-Nov-1955 DOA: 11/22/2017  Referring physician:  PCP: Dorothyann Peng, NP   Chief Complaint: "I had a fever."    HPI: Jacob Quinn is a 62 y.o. male with past medical history of restless leg, renal insufficiency, hypertension, high cholesterol, enlarged prostate presents with fever.  Patient just recently had left-sided knee surgery on 21 January.  He was discharged home on the 22nd.  Vision states he did not feel 100% but was making progress.  The day of admission he instantly felt "terrible".  He was found to have a fever at home of 102.  His daughter called his surgeon who directed him to come into the clinic for evaluation.  Patient reports that at the orthopedist office patient had a knee tap and was told the fluid did not appear infected.  Patient was sent to the emergency room for further evaluation.  ED course: UA came back positive for pyuria.  Patient with fever and other signs of Sirs.  Treatment started for postop infection.  Hospitalist consulted for admission.   Review of Systems:  As per HPI otherwise 10 point review of systems negative.    Past Medical History:  Diagnosis Date  . BPH associated with nocturia   . CAD in native artery    a. NSTEMI 07/2016 -  LHC 08/07/16 showing 70% D1, 20% mLAD, segmental 95% then 75% OM stenosis, 85% acute marg, normal LVEDP - received PTCA/DES to OM.  Marland Kitchen Encephalitis, viral   . Hyperlipidemia 08/07/2016  . Hypertension   . Ischemic cardiomyopathy    a. LHC 07/2016 - mild mid anterolateral focal hypocontractility felt to be due to the circumflex stenosis, EF 50%.  . OA (osteoarthritis)   . Obesity (BMI 30-39.9) 08/07/2016  . Renal insufficiency    a. 2016 r/t dehydration/ACEI use.  Marland Kitchen Restless leg syndrome 09/16/2010  . Rheumatic fever   . Syncope 2009   associated with viral URI   Past Surgical History:  Procedure Laterality Date  . CARDIAC  CATHETERIZATION N/A 08/07/2016   Procedure: Left Heart Cath and Coronary Angiography;  Surgeon: Troy Sine, MD;  Location: [facility];  Service: Cardiovascular;  Laterality: N/A;  . CARDIAC CATHETERIZATION N/A 08/07/2016   Procedure: Coronary Stent Intervention;  Surgeon: Troy Sine, MD;  Location: [place] [facility];  Service: Cardiovascular;  Laterality: N/A;  . REPLACEMENT TOTAL KNEE     left and right-  5 total   . TONSILLECTOMY    . TOTAL KNEE REVISION Left 11/19/2017   Procedure: LEFT TOTAL KNEE REVISION;  Surgeon: Frederik Pear, MD;  Location: [facility];  Service: Orthopedics;  Laterality: Left;   Social History:  reports that  has never smoked. he has never used smokeless tobacco. He reports that he does not drink alcohol or use drugs.  Allergies  Allergen Reactions  . Penicillins Anaphylaxis    Has patient had a PCN reaction causing immediate rash, facial/tongue/throat swelling, SOB or lightheadedness with hypotension: Yes Has patient had a PCN reaction causing severe rash involving mucus membranes or skin necrosis: No Has patient had a PCN reaction that required hospitalization: No Has patient had a PCN reaction occurring within the last 10 years: No If all of the above answers are "NO", then may proceed with Cephalosporin use.   . Tizanidine Other (See Comments)    High fevers, shaking    Family History  Problem Relation Age of Onset  .  Hypertension Mother   . Heart disease Mother   . Prostate cancer Father   . Dementia Father   . Depression Brother   . Hypertension Brother   . Hypertension Brother   . Stroke Brother      Prior to Admission medications   Medication Sig Start Date End Date Taking? Authorizing Provider  amLODipine (NORVASC) 10 MG tablet TAKE 1 TABLET (10 MG TOTAL) BY MOUTH DAILY. 07/04/17  Yes Dunn, Dayna N, PA-C  atorvastatin (LIPITOR) 80 MG tablet TAKE 1 TABLET (80 MG TOTAL) BY MOUTH EVERY EVENING. 07/04/17  Yes Dunn, Dayna N, PA-C  Evolocumab  (REPATHA SURECLICK) 373 MG/ML SOAJ Inject 140 mg into the skin every 14 (fourteen) days. 09/27/17  Yes Troy Sine, MD  ezetimibe (ZETIA) 10 MG tablet Take 1 tablet (10 mg total) by mouth daily. 02/23/17 11/22/17 Yes Troy Sine, MD  lisinopril (PRINIVIL,ZESTRIL) 40 MG tablet Take 1 tablet (40 mg total) by mouth daily. 02/23/17 11/22/17 Yes Troy Sine, MD  metoprolol succinate (TOPROL XL) 25 MG 24 hr tablet Take 1 tablet (25 mg total) by mouth daily. 08/29/17  Yes Troy Sine, MD  Multiple Vitamin (MULTI VITAMIN DAILY) TABS Take 1 tablet by mouth daily. 08/24/16  Yes Barrett, Evelene Croon, PA-C  nitroGLYCERIN (NITROSTAT) 0.4 MG SL tablet Place 1 tablet (0.4 mg total) under the tongue every 5 (five) minutes as needed for chest pain. Up to 3 doses. 08/08/16  Yes Dunn, Dayna N, PA-C  oxyCODONE-acetaminophen (PERCOCET/ROXICET) 5-325 MG tablet Take 1 tablet by mouth every 4 (four) hours as needed for severe pain. 11/19/17  Yes Leighton Parody, PA-C  spironolactone (ALDACTONE) 25 MG tablet Take 25 mg by mouth daily. 11/17/17  Yes [provider]  traMADol (ULTRAM) 50 MG tablet Take 1 tablet (50 mg total) by mouth every 8 (eight) hours as needed. 08/16/17  Yes Meredith Pel, MD  aspirin EC 325 MG tablet Take 1 tablet (325 mg total) by mouth 2 (two) times daily. 11/19/17   Leighton Parody, PA-C  ticagrelor (BRILINTA) 90 MG TABS tablet Take 1 tablet (90 mg total) by mouth every 12 (twelve) hours. 08/08/16   Dunn, Nedra Hai, PA-C  tiZANidine (ZANAFLEX) 2 MG tablet Take 1 tablet (2 mg total) by mouth every 6 (six) hours as needed for muscle spasms. Patient not taking: Reported on 11/22/2017 11/19/17   Leighton Parody, PA-C   Physical Exam: Vitals:   11/22/17 1917 11/22/17 2030 11/22/17 2100 11/22/17 2130  BP: (!) 158/95  (!) 142/91 (!) 144/91  Pulse: (!) 140 (!) 112 (!) 106 (!) 103  Resp: 20 (!) 21 16 (!) 23  Temp: (!) 103 F (39.4 C)     TempSrc: Oral     SpO2: 96% 96% 97% 97%     Wt Readings from Last 3 Encounters:  11/19/17 112.5 kg (248 lb)  11/14/17 112.5 kg (248 lb 1.6 oz)  08/29/17 112.3 kg (247 lb 9.6 oz)    General:  Appears calm and comfortable; A&Ox3 Eyes:  PERRL, EOMI, normal lids, iris ENT:  grossly normal hearing, lips & tongue Neck:  no LAD, masses or thyromegaly Cardiovascular:  RRR, no m/r/g. No LE edema.  Respiratory:  CTA bilaterally, no w/r/r. Normal respiratory effort. Abdomen:  soft, ntnd Skin:  no rash or induration seen on limited exam Musculoskeletal:  grossly normal tone BUE/BLE; knee mobile and wrapped in compression dressing Psychiatric:  grossly normal mood and affect, speech fluent and appropriate Neurologic:  CN 2-12 grossly intact, moves all extremities in coordinated fashion.          Labs on Admission:  Basic Metabolic Panel: Recent Labs  Lab 11/20/17 0746 11/22/17 1942  NA 136 133*  K 4.3 4.1  CL 101 97*  CO2 24 24  GLUCOSE 127* 120*  BUN 12 12  CREATININE 1.13 1.33*  CALCIUM 8.7* 8.7*   Liver Function Tests: Recent Labs  Lab 11/22/17 1942  AST 25  ALT 20  ALKPHOS 91  BILITOT 0.8  PROT 7.2  ALBUMIN 3.3*   No results for input(s): LIPASE, AMYLASE in the last 168 hours. No results for input(s): AMMONIA in the last 168 hours. CBC: Recent Labs  Lab 11/20/17 0746 11/22/17 1942  WBC 19.6* 15.5*  NEUTROABS  --  12.8*  HGB 10.8* 10.2*  HCT 33.5* 32.2*  MCV 89.3 89.7  PLT 269 266   Cardiac Enzymes: No results for input(s): CKTOTAL, CKMB, CKMBINDEX, TROPONINI in the last 168 hours.  BNP (last 3 results) No results for input(s): BNP in the last 8760 hours.  ProBNP (last 3 results) No results for input(s): PROBNP in the last 8760 hours.   Serum creatinine: 1.33 mg/dL (H) 11/22/17 1942 Estimated creatinine clearance: 74.7 mL/min (A)  CBG: No results for input(s): GLUCAP in the last 168 hours.  Radiological Exams on Admission: Dg Chest 2 View  Result Date: 11/22/2017 CLINICAL DATA:  Code  sepsis fever EXAM: CHEST  2 VIEW COMPARISON:  11/14/2017 FINDINGS: The heart size and mediastinal contours are within normal limits. Both lungs are clear. The visualized skeletal structures are unremarkable. IMPRESSION: No active cardiopulmonary disease. Electronically Signed   By: Donavan Foil M.D.   On: 11/22/2017 20:14    EKG: Independently reviewed. NSR. 1st deg AV block. No stemi.  Assessment/Plan Active Problems:   Post op infection  UTI Started on aztreonam and levaquin in ED Can stop levaquin in AM  Post OP Knee Surgery Ortho consult in AM PT eval   RLS Will monitor  CKD Monitor Cr daily Cr at baseline,  1.1 1.3 on admi Gentle IVF  Hypertension When necessary hydralazine 10 mg IV as needed for severe blood pressure Cont lisinopril, spironolactone  CP/CAD Prn ntg sl Cont Ticagrelo  Hyperlipidemia Continue zetia  benign prostatic hypertrophy Will cont to monitor for signs PVR ordered  Code Status: FC  DVT Prophylaxis: heparin Family Communication: dgtr at bedside Disposition Plan: Pending Improvement  Status: inpt tele  Elwin Mocha, MD Family Medicine [facility] [email or web address] Password [email or web address]

## 2017-11-22 NOTE — ED Provider Notes (Signed)
[facility] EMERGENCY DEPARTMENT Provider Note   CSN: [id number] Arrival date & time: 11/22/17  2993    History   Chief Complaint Chief Complaint  Patient presents with  . Urinary Frequency    HPI Jacob Quinn is a 62 y.o. male.  62 year old male status post left knee replacement by Dr. Mayer Camel on 11/19/17 presents to the emergency department for evaluation of fever.  Fever 103 F in triage.  Patient has had increasing fatigue over the past 24 hours and eaten very little.  Symptoms also associated with dysuria as well as urinary frequency.  He reports having a catheter during his surgical procedure.  He had some nausea this morning, but denies vomiting.  No CP, SOB.  He saw Dr. Mayer Camel prior to ED presentation who drained fluid from the left knee, but did not expressed concern about joint infection; knee x-ray in the office was unremarkable.      Past Medical History:  Diagnosis Date  . BPH associated with nocturia   . CAD in native artery    a. NSTEMI 07/2016 -  LHC 08/07/16 showing 70% D1, 20% mLAD, segmental 95% then 75% OM stenosis, 85% acute marg, normal LVEDP - received PTCA/DES to OM.  Marland Kitchen Encephalitis, viral   . Hyperlipidemia 08/07/2016  . Hypertension   . Ischemic cardiomyopathy    a. LHC 07/2016 - mild mid anterolateral focal hypocontractility felt to be due to the circumflex stenosis, EF 50%.  . OA (osteoarthritis)   . Obesity (BMI 30-39.9) 08/07/2016  . Renal insufficiency    a. 2016 r/t dehydration/ACEI use.  Marland Kitchen Restless leg syndrome 09/16/2010  . Rheumatic fever   . Syncope 2009   associated with viral URI    Patient Active Problem List   Diagnosis Date Noted  . Primary osteoarthritis of left knee 11/19/2017  . 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 Presence of left artificial knee joint 12/20/2016  . Presence of right artificial knee joint 12/20/2016  . Essential hypertension 08/08/2016  . Cardiomyopathy, ischemic  08/08/2016  . NSTEMI (non-ST elevated myocardial infarction) ([place]) 08/07/2016  . Obesity (BMI 30-39.9) 08/07/2016  . Hyperlipidemia 08/07/2016  . CAD (coronary artery disease), native coronary artery 08/07/2016  . Restless leg syndrome 09/16/2010  . Hypertensive heart disease without CHF 11/22/2007  . Osteoarthritis 11/22/2007    Past Surgical History:  Procedure Laterality Date  . CARDIAC CATHETERIZATION N/A 08/07/2016   Procedure: Left Heart Cath and Coronary Angiography;  Surgeon: Troy Sine, MD;  Location: [facility];  Service: Cardiovascular;  Laterality: N/A;  . CARDIAC CATHETERIZATION N/A 08/07/2016   Procedure: Coronary Stent Intervention;  Surgeon: Troy Sine, MD;  Location: [facility];  Service: Cardiovascular;  Laterality: N/A;  . REPLACEMENT TOTAL KNEE     left and right-  5 total   . TONSILLECTOMY    . TOTAL KNEE REVISION Left 11/19/2017   Procedure: LEFT TOTAL KNEE REVISION;  Surgeon: Frederik Pear, MD;  Location: [facility];  Service: Orthopedics;  Laterality: Left;       Home Medications    Prior to Admission medications   Medication Sig Start Date End Date Taking? Authorizing Provider  amLODipine (NORVASC) 10 MG tablet TAKE 1 TABLET (10 MG TOTAL) BY MOUTH DAILY. 07/04/17  Yes Dunn, Dayna N, PA-C  atorvastatin (LIPITOR) 80 MG tablet TAKE 1 TABLET (80 MG TOTAL) BY MOUTH EVERY EVENING. 07/04/17  Yes Dunn, Dayna N, PA-C  Evolocumab (REPATHA SURECLICK) 716  MG/ML SOAJ Inject 140 mg into the skin every 14 (fourteen) days. 09/27/17  Yes Troy Sine, MD  ezetimibe (ZETIA) 10 MG tablet Take 1 tablet (10 mg total) by mouth daily. 02/23/17 11/22/17 Yes Troy Sine, MD  lisinopril (PRINIVIL,ZESTRIL) 40 MG tablet Take 1 tablet (40 mg total) by mouth daily. 02/23/17 11/22/17 Yes Troy Sine, MD  metoprolol succinate (TOPROL XL) 25 MG 24 hr tablet Take 1 tablet (25 mg total) by mouth daily. 08/29/17  Yes Troy Sine, MD  Multiple Vitamin (MULTI VITAMIN  DAILY) TABS Take 1 tablet by mouth daily. 08/24/16  Yes Barrett, Evelene Croon, PA-C  nitroGLYCERIN (NITROSTAT) 0.4 MG SL tablet Place 1 tablet (0.4 mg total) under the tongue every 5 (five) minutes as needed for chest pain. Up to 3 doses. 08/08/16  Yes Dunn, Dayna N, PA-C  oxyCODONE-acetaminophen (PERCOCET/ROXICET) 5-325 MG tablet Take 1 tablet by mouth every 4 (four) hours as needed for severe pain. 11/19/17  Yes Leighton Parody, PA-C  spironolactone (ALDACTONE) 25 MG tablet Take 25 mg by mouth daily. 11/17/17  Yes [provider]  traMADol (ULTRAM) 50 MG tablet Take 1 tablet (50 mg total) by mouth every 8 (eight) hours as needed. 08/16/17  Yes Meredith Pel, MD  aspirin EC 325 MG tablet Take 1 tablet (325 mg total) by mouth 2 (two) times daily. 11/19/17   Leighton Parody, PA-C  ticagrelor (BRILINTA) 90 MG TABS tablet Take 1 tablet (90 mg total) by mouth every 12 (twelve) hours. 08/08/16   Dunn, Nedra Hai, PA-C  tiZANidine (ZANAFLEX) 2 MG tablet Take 1 tablet (2 mg total) by mouth every 6 (six) hours as needed for muscle spasms. Patient not taking: Reported on 11/22/2017 11/19/17   Leighton Parody, PA-C    Family History Family History  Problem Relation Age of Onset  . Hypertension Mother   . Heart disease Mother   . Prostate cancer Father   . Dementia Father   . Depression Brother   . Hypertension Brother   . Hypertension Brother   . Stroke Brother     Social History Social History   Tobacco Use  . Smoking status: Never Smoker  . Smokeless tobacco: Never Used  Substance Use Topics  . Alcohol use: No  . Drug use: No     Allergies   Penicillins and Tizanidine   Review of Systems Review of Systems Ten systems reviewed and are negative for acute change, except as noted in the HPI.    Physical Exam Updated Vital Signs BP (!) 144/91   Pulse (!) 103   Temp (!) 103 F (39.4 C) (Oral)   Resp (!) 23   SpO2 97%   Physical Exam  Constitutional: He is oriented to  person, place, and time. He appears well-developed and well-nourished. No distress.  Fatigued appearing, nontoxic.  HENT:  Head: Normocephalic and atraumatic.  Eyes: Conjunctivae and EOM are normal. No scleral icterus.  Neck: Normal range of motion.  Cardiovascular: Regular rhythm and intact distal pulses.  Tachycardia  Pulmonary/Chest: Effort normal. No stridor. No respiratory distress. He has no wheezes.  Lungs CTAB  Musculoskeletal: Normal range of motion.  Neurological: He is alert and oriented to person, place, and time. He exhibits normal muscle tone. Coordination normal.  Skin: Skin is warm and dry. No rash noted. He is not diaphoretic. No erythema. No pallor.  Psychiatric: He has a normal mood and affect. His behavior is normal.  Nursing note and vitals  reviewed.    ED Treatments / Results  Labs (all labs ordered are listed, but only abnormal results are displayed) Labs Reviewed  COMPREHENSIVE METABOLIC PANEL - Abnormal; Notable for the following components:      Result Value   Sodium 133 (*)    Chloride 97 (*)    Glucose, Bld 120 (*)    Creatinine, Ser 1.33 (*)    Calcium 8.7 (*)    Albumin 3.3 (*)    GFR calc non Af Amer 55 (*)    All other components within normal limits  CBC WITH DIFFERENTIAL/PLATELET - Abnormal; Notable for the following components:   WBC 15.5 (*)    RBC 3.59 (*)    Hemoglobin 10.2 (*)    HCT 32.2 (*)    Neutro Abs 12.8 (*)    Monocytes Absolute 1.4 (*)    All other components within normal limits  URINALYSIS, ROUTINE W REFLEX MICROSCOPIC - Abnormal; Notable for the following components:   APPearance HAZY (*)    Hgb urine dipstick SMALL (*)    Protein, ur 30 (*)    Nitrite POSITIVE (*)    Leukocytes, UA MODERATE (*)    Bacteria, UA MANY (*)    Squamous Epithelial / LPF 0-5 (*)    All other components within normal limits  CULTURE, BLOOD (ROUTINE X 2)  CULTURE, BLOOD (ROUTINE X 2)  URINE CULTURE  PROTIME-INR  I-STAT CG4 LACTIC ACID, ED   I-STAT CG4 LACTIC ACID, ED    EKG  EKG Interpretation None       Radiology Dg Chest 2 View  Result Date: 11/22/2017 CLINICAL DATA:  Code sepsis fever EXAM: CHEST  2 VIEW COMPARISON:  11/14/2017 FINDINGS: The heart size and mediastinal contours are within normal limits. Both lungs are clear. The visualized skeletal structures are unremarkable. IMPRESSION: No active cardiopulmonary disease. Electronically Signed   By: Donavan Foil M.D.   On: 11/22/2017 20:14    Procedures Procedures (including critical care time)  Medications Ordered in ED Medications  sodium chloride 0.9 % bolus 1,000 mL (1,000 mLs Intravenous New Bag/Given 11/22/17 2037)    And  sodium chloride 0.9 % bolus 1,000 mL (0 mLs Intravenous Stopped 11/22/17 2131)    And  sodium chloride 0.9 % bolus 1,000 mL (not administered)    And  sodium chloride 0.9 % bolus 500 mL (not administered)  levofloxacin (LEVAQUIN) IVPB 750 mg (750 mg Intravenous New Bag/Given 11/22/17 2100)  aztreonam (AZACTAM) 2 g in dextrose 5 % 50 mL IVPB (not administered)  levofloxacin (LEVAQUIN) IVPB 750 mg (not administered)  aztreonam (AZACTAM) 2 g in dextrose 5 % 50 mL IVPB (not administered)  ketorolac (TORADOL) 30 MG/ML injection 30 mg (not administered)  acetaminophen (TYLENOL) tablet 1,000 mg (1,000 mg Oral Given 11/22/17 2104)     Initial Impression / Assessment and Plan / ED Course  I have reviewed the triage vital signs and the nursing notes.  Pertinent labs & imaging results that were available during my care of the patient were reviewed by me and considered in my medical decision making (see chart for details).     62 year old male presents to the emergency department for evaluation of fever as well as dysuria and urinary frequency.  Clinical picture consistent with sepsis secondary to UTI.  The patient has been started on IV antibiotics.  Weightbase fluids ordered and Tylenol given for fever of 103 F in triage.  Patient remains  hemodynamically stable.  Will admit for further management.  Case discussed with Dr. Aggie Moats of [facility] who will evaluate in the department for admission.   Final Clinical Impressions(s) / ED Diagnoses   Final diagnoses:  Sepsis, due to unspecified organism [facility])  Acute cystitis without hematuria    ED Discharge Orders    None       Antonietta Breach, PA-C 11/22/17 2149    Macarthur Critchley, MD 11/22/17 2320

## 2017-11-22 NOTE — ED Triage Notes (Signed)
Pt had left knee replacement on Monday, was released Tuesday.  He was taken to the provider today for a fever, now is currently 103. He was told to come to the ED to "get a catheter" b/c of the frequent urination he is experiencing.  Heart rate is 140 bpm and pt appears weak, sleeping most of the day.

## 2017-11-23 ENCOUNTER — Other Ambulatory Visit: Payer: Self-pay

## 2017-11-23 ENCOUNTER — Encounter (HOSPITAL_COMMUNITY): Payer: Self-pay | Admitting: Family Medicine

## 2017-11-23 DIAGNOSIS — A419 Sepsis, unspecified organism: Secondary | ICD-10-CM

## 2017-11-23 LAB — CBC
HEMATOCRIT: 28.5 % — AB (ref 39.0–52.0)
HEMOGLOBIN: 9.2 g/dL — AB (ref 13.0–17.0)
MCH: 28.9 pg (ref 26.0–34.0)
MCHC: 32.3 g/dL (ref 30.0–36.0)
MCV: 89.6 fL (ref 78.0–100.0)
Platelets: 239 10*3/uL (ref 150–400)
RBC: 3.18 MIL/uL — AB (ref 4.22–5.81)
RDW: 13.3 % (ref 11.5–15.5)
WBC: 11.1 10*3/uL — AB (ref 4.0–10.5)

## 2017-11-23 LAB — BASIC METABOLIC PANEL
ANION GAP: 10 (ref 5–15)
BUN: 13 mg/dL (ref 6–20)
CO2: 24 mmol/L (ref 22–32)
Calcium: 8 mg/dL — ABNORMAL LOW (ref 8.9–10.3)
Chloride: 104 mmol/L (ref 101–111)
Creatinine, Ser: 1.11 mg/dL (ref 0.61–1.24)
GLUCOSE: 101 mg/dL — AB (ref 65–99)
POTASSIUM: 3.9 mmol/L (ref 3.5–5.1)
Sodium: 138 mmol/L (ref 135–145)

## 2017-11-23 MED ORDER — DEXTROSE 5 % IV SOLN
2.0000 g | INTRAVENOUS | Status: DC
  Administered 2017-11-23: 2 g via INTRAVENOUS
  Filled 2017-11-23 (×2): qty 2

## 2017-11-23 MED ORDER — BISACODYL 10 MG RE SUPP
10.0000 mg | Freq: Every day | RECTAL | Status: DC | PRN
  Filled 2017-11-23: qty 1

## 2017-11-23 MED ORDER — BISACODYL 5 MG PO TBEC
5.0000 mg | DELAYED_RELEASE_TABLET | Freq: Every day | ORAL | Status: DC | PRN
  Administered 2017-11-23 – 2017-11-24 (×2): 5 mg via ORAL
  Filled 2017-11-23 (×3): qty 1

## 2017-11-23 NOTE — Progress Notes (Signed)
PROGRESS NOTE  Jacob Quinn  OAC:[id number] DOB: 1956/10/16 DOA: 11/22/2017 PCP: Dorothyann Peng, NP   Brief Narrative: Jacob Quinn is a 62 y.o. male with recent revision of left total knee 1/21 discharged in stable condition 1/22 who presented on the advice of orthopedics for fever to 102F and lethargy at home. At the ortho office knee XR was stable and the joint was aspirated showing hemarthrosis, culture sent. He exhibited urinary frequency, urgency, incontinence and dysuria, referred to the ED where the patient was febrile, UA demonstrated pyuria, and aztreonam, levaquin were administered with IV fluids.   Assessment & Plan: Active Problems:   Post op infection  Sepsis due to UTI:  - Convert to ceftriaxone for UTI (review of records indicates tolerance of 1st and 2nd gen cephalosporins).  - Monitor blood cultures and urine culture. Leukocytosis improving.   BPH:  - Continue foley, will due TOV prior to DC.   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  - Appreciate orthopedics evaluation. Per them, WBAT. PT evaluation today.  - Will monitor joint aspirate culture  Stage III CKD: - Monitor SCr which is at baseline - Continue ACE inhibitor, spiro  CAD s/p PTCA/DES to OM: No chest pain. LHC 07/2016 - mild mid anterolateral focal hypocontractility felt to be due to the circumflex stenosis, EF 50%. - Continue DAPT, BB, prn NTG - Continue zetia - NTG prn  HTN:  - Contineu norvasc, lisinopril, metoprolol, spironolactone  DVT prophylaxis: ASA BID Code Status: Full Family Communication: Daughter at bedside Disposition Plan: Home once sepsis resolved.   Consultants:   Orthopedics  Procedures:   None  Antimicrobials:  Ceftriaxone 1/25 >>  Levaquin, aztreonam 1/24 - 1/25   Subjective: Feels better, less lethargic, but generally ill still. Knee pain is stable, not significant at rest.  Endorses difficulty with urination.   Objective: Vitals:   11/23/17 0900 11/23/17 1319 11/23/17 1427 11/23/17 1553  BP:  126/72 (!) 160/74   Pulse:  86 83   Resp:  17 19   Temp: (!) 101.3 F (38.5 C) 99.8 F (37.7 C) 99.6 F (37.6 C) 99.1 F (37.3 C)  TempSrc: Oral Oral Oral Oral  SpO2:  98% 99%     Intake/Output Summary (Last 24 hours) at 11/23/2017 1602 Last data filed at 11/23/2017 1600 Gross per 24 hour  Intake 4000 ml  Output 2500 ml  Net 1500 ml   Gen: Tired-appearing male in no distress Pulm: Non-labored breathing room air. Clear to auscultation bilaterally.  CV: Regular rate and rhythm. No murmur, rub, or gallop. No JVD, no pedal edema. GI: Abdomen soft, non-tender, no suprapubic or CVA tenderness, non-distended, with normoactive bowel sounds. No organomegaly or masses felt. Ext: Warm, RLE scars over knee noted; left knee wrapped in ACE with intact ROM that does not elicit severe pain Skin: No rashes, lesions or other wounds Neuro: Alert and oriented. No focal neurological deficits. Psych: Judgement and insight appear normal. Mood & affect appropriate.   Data Reviewed: I have personally reviewed following labs and imaging studies  CBC: Recent Labs  Lab 11/20/17 0746 11/22/17 1942 11/22/17 2214 11/23/17 0423  WBC 19.6* 15.5* 13.5* 11.1*  NEUTROABS  --  12.8*  --   --   HGB 10.8* 10.2* 8.8* 9.2*  HCT 33.5* 32.2* 27.6* 28.5*  MCV 89.3 89.7 89.3 89.6  PLT 269 266 225 010   Basic Metabolic Panel: Recent Labs  Lab  11/20/17 0746 11/22/17 1942 11/22/17 2214 11/23/17 0423  NA 136 133*  --  138  K 4.3 4.1  --  3.9  CL 101 97*  --  104  CO2 24 24  --  24  GLUCOSE 127* 120*  --  101*  BUN 12 12  --  13  CREATININE 1.13 1.33* 1.17 1.11  CALCIUM 8.7* 8.7*  --  8.0*   GFR: Estimated Creatinine Clearance: 89.5 mL/min (by C-G formula based on SCr of 1.11 mg/dL). Liver Function Tests: Recent Labs  Lab 11/22/17 1942  AST 25  ALT 20  ALKPHOS 91  BILITOT 0.8   PROT 7.2  ALBUMIN 3.3*   No results for input(s): LIPASE, AMYLASE in the last 168 hours. No results for input(s): AMMONIA in the last 168 hours. Coagulation Profile: Recent Labs  Lab 11/22/17 1942  INR 1.09   Cardiac Enzymes: No results for input(s): CKTOTAL, CKMB, CKMBINDEX, TROPONINI in the last 168 hours. BNP (last 3 results) No results for input(s): PROBNP in the last 8760 hours. HbA1C: No results for input(s): HGBA1C in the last 72 hours. CBG: No results for input(s): GLUCAP in the last 168 hours. Lipid Profile: No results for input(s): CHOL, HDL, LDLCALC, TRIG, CHOLHDL, LDLDIRECT in the last 72 hours. Thyroid Function Tests: No results for input(s): TSH, T4TOTAL, FREET4, T3FREE, THYROIDAB in the last 72 hours. Anemia Panel: No results for input(s): VITAMINB12, FOLATE, FERRITIN, TIBC, IRON, RETICCTPCT in the last 72 hours. Urine analysis:    Component Value Date/Time   COLORURINE YELLOW 11/22/2017 1949   APPEARANCEUR HAZY (A) 11/22/2017 1949   LABSPEC 1.014 11/22/2017 1949   PHURINE 8.0 11/22/2017 1949   GLUCOSEU NEGATIVE 11/22/2017 1949   HGBUR SMALL (A) 11/22/2017 1949   HGBUR negative 07/09/2009 0853   BILIRUBINUR NEGATIVE 11/22/2017 1949   BILIRUBINUR n 09/22/2015 1613   KETONESUR NEGATIVE 11/22/2017 1949   PROTEINUR 30 (A) 11/22/2017 1949   UROBILINOGEN 0.2 09/22/2015 1613   UROBILINOGEN 0.2 07/09/2009 0853   NITRITE POSITIVE (A) 11/22/2017 1949   LEUKOCYTESUR MODERATE (A) 11/22/2017 1949   Recent Results (from the past 240 hour(s))  Surgical pcr screen     Status: None   Collection Time: 11/14/17  8:51 AM  Result Value Ref Range Status   MRSA, PCR NEGATIVE NEGATIVE Final   Staphylococcus aureus NEGATIVE NEGATIVE Final    Comment: (NOTE) The Xpert SA Assay (FDA approved for NASAL specimens in patients 20 years of age and older), is one component of a comprehensive surveillance program. It is not intended to diagnose infection nor to guide or monitor  treatment.   Aerobic/Anaerobic Culture (surgical/deep wound)     Status: None (Preliminary result)   Collection Time: 11/19/17 10:50 AM  Result Value Ref Range Status   Specimen Description SYNOVIAL LEFT KNEE  Final   Special Requests SPECIMEN ON SWABS  Final   Gram Stain NO WBC SEEN NO ORGANISMS SEEN   Final   Culture   Final    NO GROWTH 4 DAYS NO ANAEROBES ISOLATED; CULTURE IN PROGRESS FOR 5 DAYS   Report Status PENDING  Incomplete  Aerobic/Anaerobic Culture (surgical/deep wound)     Status: None (Preliminary result)   Collection Time: 11/19/17 10:55 AM  Result Value Ref Range Status   Specimen Description TISSUE LEFT KNEE  Final   Special Requests NONE  Final   Gram Stain NO WBC SEEN NO ORGANISMS SEEN   Final   Culture   Final    NO GROWTH  4 DAYS NO ANAEROBES ISOLATED; CULTURE IN PROGRESS FOR 5 DAYS   Report Status PENDING  Incomplete  Aerobic/Anaerobic Culture (surgical/deep wound)     Status: None (Preliminary result)   Collection Time: 11/19/17 11:49 AM  Result Value Ref Range Status   Specimen Description TISSUE LEFT KNEE FROM PROTHESIS  Final   Special Requests NONE  Final   Gram Stain NO WBC SEEN NO ORGANISMS SEEN   Final   Culture   Final    NO GROWTH 4 DAYS NO ANAEROBES ISOLATED; CULTURE IN PROGRESS FOR 5 DAYS   Report Status PENDING  Incomplete  Culture, blood (Routine x 2)     Status: None (Preliminary result)   Collection Time: 11/22/17  7:35 PM  Result Value Ref Range Status   Specimen Description BLOOD RIGHT ARM  Final   Special Requests   Final    BOTTLES DRAWN AEROBIC AND ANAEROBIC Blood Culture adequate volume   Culture NO GROWTH < 24 HOURS  Final   Report Status PENDING  Incomplete  Culture, blood (Routine x 2)     Status: None (Preliminary result)   Collection Time: 11/22/17  7:45 PM  Result Value Ref Range Status   Specimen Description BLOOD LEFT ARM  Final   Special Requests IN PEDIATRIC BOTTLE Blood Culture adequate volume  Final   Culture NO  GROWTH < 24 HOURS  Final   Report Status PENDING  Incomplete      Radiology Studies: Dg Chest 2 View  Result Date: 11/22/2017 CLINICAL DATA:  Code sepsis fever EXAM: CHEST  2 VIEW COMPARISON:  11/14/2017 FINDINGS: The heart size and mediastinal contours are within normal limits. Both lungs are clear. The visualized skeletal structures are unremarkable. IMPRESSION: No active cardiopulmonary disease. Electronically Signed   By: Donavan Foil M.D.   On: 11/22/2017 20:14    Scheduled Meds: . amLODipine  10 mg Oral Daily  . aspirin EC  325 mg Oral BID  . atorvastatin  80 mg Oral QPM  . ezetimibe  10 mg Oral Daily  . lisinopril  40 mg Oral Daily  . metoprolol succinate  25 mg Oral Daily  . spironolactone  25 mg Oral Daily  . ticagrelor  90 mg Oral Q12H   Continuous Infusions: . cefTRIAXone (ROCEPHIN)  IV Stopped (11/23/17 1506)     LOS: 1 day   Time spent: 25 minutes.  Vance Gather, MD [facility] Pager [phone]  If 7PM-7AM, please contact night-coverage [email or web address] Password [facility] 11/23/2017, 4:02 PM

## 2017-11-23 NOTE — Care Management Note (Signed)
Case Management Note  Patient Details  Name: KAYLEM GIDNEY MRN: [id number] Date of Birth: 11/24/1955  Subjective/Objective:   Pt is status post left total knee revision of long stem implants with placement of hinged total knee with long stems on 11/19/17. Readmitted after visiting MD office with SIRS, postop infection.              Action/Plan: Patient was active with [facility], will resume HHPT with them at discharge. Will have family support.   Expected Discharge Date:   pending               Expected Discharge Plan:  [place]  In-House Referral:  NA  Discharge planning Services  CM Consult  Post Acute Care Choice:  Home Health Choice offered to:  Patient  DME Arranged:  N/A(has RW and 3in1) DME Agency:  NA  HH Arranged:  PT [place] Agency:  [facility] (formerly [employer])  Status of Service:  Completed, signed off  If discussed at [employer], dates discussed:    Additional Comments:  Ninfa Meeker, RN 11/23/2017, 3:15 PM

## 2017-11-23 NOTE — Progress Notes (Signed)
Text paged MD about patient not having a bowel movement for five days. Awaiting response.

## 2017-11-23 NOTE — Evaluation (Signed)
Physical Therapy Evaluation Patient Details Name: Jacob Quinn MRN: [id number] DOB: September 21, 1956 Today's Date: 11/23/2017   History of Present Illness  Pt is status post left total knee revision of long stem implants with placement of hinged total knee with long stems on 11/19/17.  Pt was discharged home after passing therapy goals on 11/20/17.  Pt's family called the office yesterday afternoon stating pt was running a fever as high as 102 and lethargic. Pt presents with postop infection.  Clinical Impression  Pt presents with dependencies in mobility secondary to recent L TKR and postop infection. Pt demonstrates decreased knee flexion and strength. Pt would benefit from acute skilled PT to maximize mobility and independence for d/c home with HHPT services. Pt's daughter was present during therapy and agrees with recommendations.    Follow Up Recommendations Home health PT;Supervision - Intermittent    Equipment Recommendations  None recommended by PT    Recommendations for Other Services       Precautions / Restrictions Precautions Precautions: Knee Required Braces or Orthoses: (KI (per patient  he was told to wear it) no orders currently) Restrictions LLE Weight Bearing: Weight bearing as tolerated      Mobility  Bed Mobility Overal bed mobility: Needs Assistance Bed Mobility: Supine to Sit     Supine to sit: Min guard     General bed mobility comments: cues for technique, bed rails used and increased time and effort  Transfers Overall transfer level: Needs assistance Equipment used: Rolling walker (2 wheeled) Transfers: Sit to/from Stand Sit to Stand: Min guard         General transfer comment: safe hand placement demonstrated; use of RW upon standing  Ambulation/Gait Ambulation/Gait assistance: Supervision Ambulation Distance (Feet): 125 Feet Assistive device: Rolling walker (2 wheeled) Gait Pattern/deviations: Step-through pattern;Trunk flexed;Decreased  stance time - left Gait velocity: decreased Gait velocity interpretation: Below normal speed for age/gender General Gait Details: cues for more upright posture and proper RW placement. cues to relax left knee and allow normal knee flexion during swing phase of gait.  Stairs            Wheelchair Mobility    Modified Rankin (Stroke Patients Only)       Balance Overall balance assessment: Needs assistance   Sitting balance-Leahy Scale: Good     Standing balance support: Bilateral upper extremity supported Standing balance-Leahy Scale: Fair                               Pertinent Vitals/Pain Pain Assessment: 0-10 Pain Score: 4  Pain Location: L knee Pain Descriptors / Indicators: Discomfort Pain Intervention(s): Limited activity within patient's tolerance;Monitored during session;Repositioned    Home Living Family/patient expects to be discharged to:: Private residence Living Arrangements: Children Available Help at Discharge: Available 24 hours/day Type of Home: House Home Access: Stairs to enter Entrance Stairs-Rails: Can reach both Entrance Stairs-Number of Steps: 5 Home Layout: One level Home Equipment: Walker - 2 wheels;Bedside commode      Prior Function Level of Independence: Independent with assistive device(s)               Hand Dominance        Extremity/Trunk Assessment   Upper Extremity Assessment Upper Extremity Assessment: Defer to OT evaluation    Lower Extremity Assessment Lower Extremity Assessment: LLE deficits/detail LLE Deficits / Details: knee extension 2/5, knee Flexion 80*       Communication  Communication: No difficulties  Cognition Arousal/Alertness: Awake/alert Behavior During Therapy: WFL for tasks assessed/performed Overall Cognitive Status: Within Functional Limits for tasks assessed                                        General Comments General comments (skin integrity, edema,  etc.): pt's daughter during eval    Exercises Total Joint Exercises Ankle Circles/Pumps: AROM;Both;Strengthening;20 reps;Supine Quad Sets: AROM;Strengthening;Both;10 reps;Supine Hip ABduction/ADduction: AAROM;Strengthening;Left;10 reps;Supine Straight Leg Raises: AAROM;Strengthening;Left;10 reps;Supine Long Arc Quad: AAROM;Strengthening;Left;10 reps;Seated Knee Flexion: Strengthening;AAROM;Left;10 reps;Seated Goniometric ROM: 80*   Assessment/Plan    PT Assessment Patient needs continued PT services  PT Problem List Decreased strength;Decreased mobility;Decreased safety awareness;Decreased range of motion;Decreased knowledge of precautions;Decreased activity tolerance;Decreased balance;Decreased knowledge of use of DME;Pain       PT Treatment Interventions DME instruction;Functional mobility training;Balance training;Patient/family education;Therapeutic activities;Gait training;Stair training;Therapeutic exercise    PT Goals (Current goals can be found in the Care Plan section)  Acute Rehab PT Goals Patient Stated Goal: To walk and d/c back home PT Goal Formulation: With patient Time For Goal Achievement: 11/30/17 Potential to Achieve Goals: Good    Frequency Min 5X/week   Barriers to discharge        Co-evaluation               AM-PAC PT "6 Clicks" Daily Activity  Outcome Measure Difficulty turning over in bed (including adjusting bedclothes, sheets and blankets)?: A Little Difficulty moving from lying on back to sitting on the side of the bed? : A Little Difficulty sitting down on and standing up from a chair with arms (e.g., wheelchair, bedside commode, etc,.)?: A Lot Help needed moving to and from a bed to chair (including a wheelchair)?: A Lot Help needed walking in hospital room?: A Lot Help needed climbing 3-5 steps with a railing? : A Lot 6 Click Score: 14    End of Session Equipment Utilized During Treatment: Gait belt Activity Tolerance: Patient  tolerated treatment well Patient left: in chair;with call bell/phone within reach;with family/visitor present Nurse Communication: Mobility status PT Visit Diagnosis: Unsteadiness on feet (R26.81);Muscle weakness (generalized) (M62.81);Pain Pain - Right/Left: Left Pain - part of body: Knee    Time: 2229-7989 PT Time Calculation (min) (ACUTE ONLY): 38 min   Charges:   PT Evaluation $PT Eval Moderate Complexity: 1 Mod PT Treatments $Gait Training: 8-22 mins   PT G Codes:       Theodoro Grist, PT  Lelon Mast 11/23/2017, 12:46 PM

## 2017-11-23 NOTE — Progress Notes (Signed)
Pt refused a complete skin assessment and said that he would do it later. Pt also refused a bath saying that he would do that later as well. Pt refused any anti-coagulants. Pt said he would like to speak with the ortho surgeon before taking because he was taken off his anti-coagulants due to increased bleeding. Will continue to monitor for safety.

## 2017-11-23 NOTE — Care Management Note (Signed)
Case Management Note  Patient Details  Name: Jacob Quinn MRN: [id number] Date of Birth: [birth year]  Subjective/Objective:                    Action/Plan:   Expected Discharge Date:  11/20/17               Expected Discharge Plan:  [place]  In-House Referral:  NA  Discharge planning Services  CM Consult  Post Acute Care Choice:  Home Health Choice offered to:  Patient  DME Arranged:  N/A(has 3in1 and RW  ) DME Agency:     HH Arranged:  PT [place]:  [facility] ([facility])  Status of Service:  Completed, signed off  If discussed at [employer], dates discussed:    Additional Comments:  Ninfa Meeker, RN 11/23/2017, 3:11 PM

## 2017-11-23 NOTE — Consult Note (Signed)
Reason for Consult: Urosepsis in patient with left total knee revision on 11/19/17 Referring Physician: Benjaman Lobe, MD  Jacob Quinn is an 62 y.o. male.   HPI: Pt is status post left total knee revision of long stem implants with placement of hinged total knee with long stems on 11/19/17.  Pt was discharged home after passing therapy goals on 11/20/17.  Pt's family called the office yesterday afternoon stating pt was running a fever as high as 102 and lethargic.  He was asked to come into the clinic for evaluation.  He was witnessed to have urinated on himself and had to go to the restroom twice while in the clinic.  Patient had x rays of the knee that were normal and his knee was aspirated with 50 cc's of hemarthrosis.  The aspirate was sent for cell count, gram stain and cultures.  Pt was advised to go to ER.     ED course: UA came back positive for pyuria.  Patient with fever and other signs of Sirs.  Treatment started for postop infection.  Hospitalist consulted for admission.    Past Medical History:  Diagnosis Date  . 1st degree AV block   . BPH associated with nocturia   . CAD in native artery    a. NSTEMI 07/2016 -  LHC 08/07/16 showing 70% D1, 20% mLAD, segmental 95% then 75% OM stenosis, 85% acute marg, normal LVEDP - received PTCA/DES to OM.  Marland Kitchen Encephalitis, viral   . Hyperlipidemia 08/07/2016  . Hypertension   . Ischemic cardiomyopathy    a. LHC 07/2016 - mild mid anterolateral focal hypocontractility felt to be due to the circumflex stenosis, EF 50%.  . OA (osteoarthritis)   . Obesity (BMI 30-39.9) 08/07/2016  . Renal insufficiency    a. 2016 r/t dehydration/ACEI use.  Marland Kitchen Restless leg syndrome 09/16/2010  . Rheumatic fever   . Syncope 2009   associated with viral URI    Past Surgical History:  Procedure Laterality Date  . CARDIAC CATHETERIZATION N/A 08/07/2016   Procedure: Left Heart Cath and Coronary Angiography;  Surgeon: Troy Sine, MD;  Location: [facility];  Service: Cardiovascular;  Laterality: N/A;  . CARDIAC CATHETERIZATION N/A 08/07/2016   Procedure: Coronary Stent Intervention;  Surgeon: Troy Sine, MD;  Location: [facility];  Service: Cardiovascular;  Laterality: N/A;  . REPLACEMENT TOTAL KNEE     left and right-  5 total   . TONSILLECTOMY    . TOTAL KNEE REVISION Left 11/19/2017   Procedure: LEFT TOTAL KNEE REVISION;  Surgeon: Frederik Pear, MD;  Location: [facility];  Service: Orthopedics;  Laterality: Left;    Family History  Problem Relation Age of Onset  . Hypertension Mother   . Heart disease Mother   . Prostate cancer Father   . Dementia Father   . Depression Brother   . Hypertension Brother   . Hypertension Brother   . Stroke Brother     Social History:  reports that  has never smoked. he has never used smokeless tobacco. He reports that he does not drink alcohol or use drugs.  Allergies:  Allergies  Allergen Reactions  . Penicillins Anaphylaxis    Has patient had a PCN reaction causing immediate rash, facial/tongue/throat swelling, SOB or lightheadedness with hypotension: Yes Has patient had a PCN reaction causing severe rash involving mucus membranes or skin necrosis: No Has patient had a PCN reaction that required hospitalization: No Has patient had a PCN  reaction occurring within the last 10 years: No If all of the above answers are "NO", then may proceed with Cephalosporin use.   . Tizanidine Other (See Comments)    High fevers, shaking    Medications: I have reviewed the patient's current medications.  Results for orders placed or performed during the hospital encounter of 11/22/17 (from the past 48 hour(s))  Comprehensive metabolic panel     Status: Abnormal   Collection Time: 11/22/17  7:42 PM  Result Value Ref Range   Sodium 133 (L) 135 - 145 mmol/L   Potassium 4.1 3.5 - 5.1 mmol/L   Chloride 97 (L) 101 - 111 mmol/L   CO2 24 22 - 32 mmol/L   Glucose, Bld 120 (H) 65 - 99 mg/dL   BUN 12 6  - 20 mg/dL   Creatinine, Ser 1.33 (H) 0.61 - 1.24 mg/dL   Calcium 8.7 (L) 8.9 - 10.3 mg/dL   Total Protein 7.2 6.5 - 8.1 g/dL   Albumin 3.3 (L) 3.5 - 5.0 g/dL   AST 25 15 - 41 U/L   ALT 20 17 - 63 U/L   Alkaline Phosphatase 91 38 - 126 U/L   Total Bilirubin 0.8 0.3 - 1.2 mg/dL   GFR calc non Af Amer 55 (L) >60 mL/min   GFR calc Af Amer >60 >60 mL/min    Comment: (NOTE) The eGFR has been calculated using the CKD EPI equation. This calculation has not been validated in all clinical situations. eGFR's persistently <60 mL/min signify possible Chronic Kidney Disease.    Anion gap 12 5 - 15  CBC with Differential     Status: Abnormal   Collection Time: 11/22/17  7:42 PM  Result Value Ref Range   WBC 15.5 (H) 4.0 - 10.5 K/uL   RBC 3.59 (L) 4.22 - 5.81 MIL/uL   Hemoglobin 10.2 (L) 13.0 - 17.0 g/dL   HCT 32.2 (L) 39.0 - 52.0 %   MCV 89.7 78.0 - 100.0 fL   MCH 28.4 26.0 - 34.0 pg   MCHC 31.7 30.0 - 36.0 g/dL   RDW 13.4 11.5 - 15.5 %   Platelets 266 150 - 400 K/uL   Neutrophils Relative % 82 %   Neutro Abs 12.8 (H) 1.7 - 7.7 K/uL   Lymphocytes Relative 9 %   Lymphs Abs 1.3 0.7 - 4.0 K/uL   Monocytes Relative 9 %   Monocytes Absolute 1.4 (H) 0.1 - 1.0 K/uL   Eosinophils Relative 0 %   Eosinophils Absolute 0.0 0.0 - 0.7 K/uL   Basophils Relative 0 %   Basophils Absolute 0.0 0.0 - 0.1 K/uL  Protime-INR     Status: None   Collection Time: 11/22/17  7:42 PM  Result Value Ref Range   Prothrombin Time 14.0 11.4 - 15.2 seconds   INR 1.09   Urinalysis, Routine w reflex microscopic     Status: Abnormal   Collection Time: 11/22/17  7:49 PM  Result Value Ref Range   Color, Urine YELLOW YELLOW   APPearance HAZY (A) CLEAR   Specific Gravity, Urine 1.014 1.005 - 1.030   pH 8.0 5.0 - 8.0   Glucose, UA NEGATIVE NEGATIVE mg/dL   Hgb urine dipstick SMALL (A) NEGATIVE   Bilirubin Urine NEGATIVE NEGATIVE   Ketones, ur NEGATIVE NEGATIVE mg/dL   Protein, ur 30 (A) NEGATIVE mg/dL   Nitrite  POSITIVE (A) NEGATIVE   Leukocytes, UA MODERATE (A) NEGATIVE   RBC / HPF 0-5 0 - 5 RBC/hpf  WBC, UA 6-30 0 - 5 WBC/hpf   Bacteria, UA MANY (A) NONE SEEN   Squamous Epithelial / LPF 0-5 (A) NONE SEEN  I-Stat CG4 Lactic Acid, ED     Status: None   Collection Time: 11/22/17  8:13 PM  Result Value Ref Range   Lactic Acid, Venous 1.59 0.5 - 1.9 mmol/L  CBC     Status: Abnormal   Collection Time: 11/22/17 10:14 PM  Result Value Ref Range   WBC 13.5 (H) 4.0 - 10.5 K/uL   RBC 3.09 (L) 4.22 - 5.81 MIL/uL   Hemoglobin 8.8 (L) 13.0 - 17.0 g/dL   HCT 27.6 (L) 39.0 - 52.0 %   MCV 89.3 78.0 - 100.0 fL   MCH 28.5 26.0 - 34.0 pg   MCHC 31.9 30.0 - 36.0 g/dL   RDW 13.4 11.5 - 15.5 %   Platelets 225 150 - 400 K/uL  Creatinine, serum     Status: None   Collection Time: 11/22/17 10:14 PM  Result Value Ref Range   Creatinine, Ser 1.17 0.61 - 1.24 mg/dL   GFR calc non Af Amer >60 >60 mL/min   GFR calc Af Amer >60 >60 mL/min    Comment: (NOTE) The eGFR has been calculated using the CKD EPI equation. This calculation has not been validated in all clinical situations. eGFR's persistently <60 mL/min signify possible Chronic Kidney Disease.   Basic metabolic panel     Status: Abnormal   Collection Time: 11/23/17  4:23 AM  Result Value Ref Range   Sodium 138 135 - 145 mmol/L   Potassium 3.9 3.5 - 5.1 mmol/L   Chloride 104 101 - 111 mmol/L   CO2 24 22 - 32 mmol/L   Glucose, Bld 101 (H) 65 - 99 mg/dL   BUN 13 6 - 20 mg/dL   Creatinine, Ser 1.11 0.61 - 1.24 mg/dL   Calcium 8.0 (L) 8.9 - 10.3 mg/dL   GFR calc non Af Amer >60 >60 mL/min   GFR calc Af Amer >60 >60 mL/min    Comment: (NOTE) The eGFR has been calculated using the CKD EPI equation. This calculation has not been validated in all clinical situations. eGFR's persistently <60 mL/min signify possible Chronic Kidney Disease.    Anion gap 10 5 - 15  CBC     Status: Abnormal   Collection Time: 11/23/17  4:23 AM  Result Value Ref Range    WBC 11.1 (H) 4.0 - 10.5 K/uL   RBC 3.18 (L) 4.22 - 5.81 MIL/uL   Hemoglobin 9.2 (L) 13.0 - 17.0 g/dL   HCT 28.5 (L) 39.0 - 52.0 %   MCV 89.6 78.0 - 100.0 fL   MCH 28.9 26.0 - 34.0 pg   MCHC 32.3 30.0 - 36.0 g/dL   RDW 13.3 11.5 - 15.5 %   Platelets 239 150 - 400 K/uL    Dg Chest 2 View  Result Date: 11/22/2017 CLINICAL DATA:  Code sepsis fever EXAM: CHEST  2 VIEW COMPARISON:  11/14/2017 FINDINGS: The heart size and mediastinal contours are within normal limits. Both lungs are clear. The visualized skeletal structures are unremarkable. IMPRESSION: No active cardiopulmonary disease. Electronically Signed   By: Donavan Foil M.D.   On: 11/22/2017 20:14    Review of Systems  Constitutional: Positive for fever.  HENT: Negative.   Skin: Negative.    Blood pressure (!) 166/80, pulse 91, temperature (!) 100.6 F (38.1 C), temperature source Oral, resp. rate 19, SpO2 96 %. Physical Exam  Assessment/Plan: S/P revision left total knee arthroplasty on 11/19/17.  We would like to hold blood thinners as he was treated for post of hemarthrosis.  We are awaiting aspirate cultures from the office as well as blood cultures from the hospital.  Pt is weight bearing as tolerated.    Urosepsis with pt followed by [facility] hospitalists.  Currently treated with levaquin and Aztreonam.  Pt under sepsis protocols.  Joanell Rising 11/23/2017, 7:49 AM

## 2017-11-24 DIAGNOSIS — A4151 Sepsis due to Escherichia coli [E. coli]: Principal | ICD-10-CM

## 2017-11-24 LAB — BLOOD CULTURE ID PANEL (REFLEXED)
Acinetobacter baumannii: NOT DETECTED
CANDIDA KRUSEI: NOT DETECTED
CANDIDA PARAPSILOSIS: NOT DETECTED
Candida albicans: NOT DETECTED
Candida glabrata: NOT DETECTED
Candida tropicalis: NOT DETECTED
ESCHERICHIA COLI: NOT DETECTED
Enterobacter cloacae complex: NOT DETECTED
Enterobacteriaceae species: NOT DETECTED
Enterococcus species: NOT DETECTED
Haemophilus influenzae: NOT DETECTED
KLEBSIELLA OXYTOCA: NOT DETECTED
KLEBSIELLA PNEUMONIAE: NOT DETECTED
Listeria monocytogenes: NOT DETECTED
Methicillin resistance: NOT DETECTED
Neisseria meningitidis: NOT DETECTED
PROTEUS SPECIES: NOT DETECTED
Pseudomonas aeruginosa: NOT DETECTED
SERRATIA MARCESCENS: NOT DETECTED
STAPHYLOCOCCUS AUREUS BCID: NOT DETECTED
STAPHYLOCOCCUS SPECIES: DETECTED — AB
Streptococcus agalactiae: NOT DETECTED
Streptococcus pneumoniae: NOT DETECTED
Streptococcus pyogenes: NOT DETECTED
Streptococcus species: NOT DETECTED

## 2017-11-24 LAB — BASIC METABOLIC PANEL
ANION GAP: 11 (ref 5–15)
BUN: 12 mg/dL (ref 6–20)
CALCIUM: 8.4 mg/dL — AB (ref 8.9–10.3)
CO2: 22 mmol/L (ref 22–32)
Chloride: 99 mmol/L — ABNORMAL LOW (ref 101–111)
Creatinine, Ser: 1.15 mg/dL (ref 0.61–1.24)
GFR calc Af Amer: >60 mL/min (ref >60)
GFR calc non Af Amer: >60 mL/min (ref >60)
Glucose, Bld: 94 mg/dL (ref 65–99)
POTASSIUM: 3.8 mmol/L (ref 3.5–5.1)
Sodium: 132 mmol/L — ABNORMAL LOW (ref 135–145)

## 2017-11-24 LAB — CBC
HEMATOCRIT: 28.4 % — AB (ref 39.0–52.0)
HEMOGLOBIN: 9.1 g/dL — AB (ref 13.0–17.0)
MCH: 28.7 pg (ref 26.0–34.0)
MCHC: 32 g/dL (ref 30.0–36.0)
MCV: 89.6 fL (ref 78.0–100.0)
Platelets: 262 10*3/uL (ref 150–400)
RBC: 3.17 MIL/uL — ABNORMAL LOW (ref 4.22–5.81)
RDW: 13.4 % (ref 11.5–15.5)
WBC: 8.2 10*3/uL (ref 4.0–10.5)

## 2017-11-24 LAB — AEROBIC/ANAEROBIC CULTURE (SURGICAL/DEEP WOUND)
CULTURE: NO GROWTH
CULTURE: NO GROWTH
GRAM STAIN: NONE SEEN

## 2017-11-24 LAB — AEROBIC/ANAEROBIC CULTURE W GRAM STAIN (SURGICAL/DEEP WOUND)
Culture: NO GROWTH
Gram Stain: NONE SEEN
Gram Stain: NONE SEEN

## 2017-11-24 MED ORDER — CEPHALEXIN 500 MG PO CAPS: 500.0000 mg | ORAL_CAPSULE | Freq: Two times a day (BID) | ORAL | 0 refills | Status: DC

## 2017-11-24 NOTE — Progress Notes (Signed)
Patient refused all efforts of obtaining a bowel movement during admission. Refused soap suds enema and suppository. Patient educated on importance of movement, hydration and laxatives to prevent bowel obstruction. Patient told that worse case scenario, surgery can be indicated for bowel obstruction. Patient verbalized understanding.

## 2017-11-24 NOTE — Progress Notes (Signed)
Patient discharged safely home with daughter. Patient's AVS was printed and signed. Patient instructed to take Keflex when he gets the prescription filled.

## 2017-11-24 NOTE — Progress Notes (Signed)
PO Ducolax given at discharge.

## 2017-11-24 NOTE — Progress Notes (Signed)
PHARMACY - PHYSICIAN COMMUNICATION CRITICAL VALUE ALERT - BLOOD CULTURE IDENTIFICATION (BCID)  Jacob Quinn is an 62 y.o. male who presented to [facility] on 11/22/2017   Name of physician (or Provider) Contacted: Bonner Puna  Current antibiotics: Ceftriaxone for UTI  Changes to prescribed antibiotics recommended:  No changes needed as this is a likely contaminant  Results for orders placed or performed during the hospital encounter of 11/22/17  Blood Culture ID Panel (Reflexed) (Collected: 11/22/2017  7:35 PM)  Result Value Ref Range   Enterococcus species NOT DETECTED NOT DETECTED   Listeria monocytogenes NOT DETECTED NOT DETECTED   Staphylococcus species DETECTED (A) NOT DETECTED   Staphylococcus aureus NOT DETECTED NOT DETECTED   Methicillin resistance NOT DETECTED NOT DETECTED   Streptococcus species NOT DETECTED NOT DETECTED   Streptococcus agalactiae NOT DETECTED NOT DETECTED   Streptococcus pneumoniae NOT DETECTED NOT DETECTED   Streptococcus pyogenes NOT DETECTED NOT DETECTED   Acinetobacter baumannii NOT DETECTED NOT DETECTED   Enterobacteriaceae species NOT DETECTED NOT DETECTED   Enterobacter cloacae complex NOT DETECTED NOT DETECTED   Escherichia coli NOT DETECTED NOT DETECTED   Klebsiella oxytoca NOT DETECTED NOT DETECTED   Klebsiella pneumoniae NOT DETECTED NOT DETECTED   Proteus species NOT DETECTED NOT DETECTED   Serratia marcescens NOT DETECTED NOT DETECTED   Haemophilus influenzae NOT DETECTED NOT DETECTED   Neisseria meningitidis NOT DETECTED NOT DETECTED   Pseudomonas aeruginosa NOT DETECTED NOT DETECTED   Candida albicans NOT DETECTED NOT DETECTED   Candida glabrata NOT DETECTED NOT DETECTED   Candida krusei NOT DETECTED NOT DETECTED   Candida parapsilosis NOT DETECTED NOT DETECTED   Candida tropicalis NOT DETECTED NOT DETECTED    [name], Rande Lawman 11/24/2017  9:55 AM

## 2017-11-24 NOTE — Progress Notes (Signed)
Offered to give patient soap suds enema per MD order for constipation. Patient however refused soap suds.

## 2017-11-24 NOTE — Progress Notes (Signed)
Subjective:   S/p Left knee revision of long stem implants with hinged total knee with post urosepsis.Patinet feeling better this morning and is moving knee well.  Activity level:  wabqt Diet tolerance:  ok Voiding:  ok Patient reports pain as mild.    Objective: Vital signs in last 24 hours: Temp:  [98.9 F (37.2 C)-101.3 F (38.5 C)] 98.9 F (37.2 C) (01/26 0558) Pulse Rate:  [77-86] 77 (01/26 0558) Resp:  [17-19] 17 (01/26 0558) BP: (126-160)/(72-77) 131/73 (01/26 0558) SpO2:  [98 %-100 %] 100 % (01/26 0558)  Labs: Recent Labs    11/22/17 1942 11/22/17 2214 11/23/17 0423 11/24/17 0448  HGB 10.2* 8.8* 9.2* 9.1*   Recent Labs    11/23/17 0423 11/24/17 0448  WBC 11.1* 8.2  RBC 3.18* 3.17*  HCT 28.5* 28.4*  PLT 239 262   Recent Labs    11/23/17 0423 11/24/17 0448  NA 138 132*  K 3.9 3.8  CL 104 99*  CO2 24 22  BUN 13 12  CREATININE 1.11 1.15  GLUCOSE 101* 94  CALCIUM 8.0* 8.4*   Recent Labs    11/22/17 1942  INR 1.09    Physical Exam:  Neurologically intact ABD soft Neurovascular intact Sensation intact distally Intact pulses distally Dorsiflexion/Plantar flexion intact Incision: dressing C/D/I and no drainage No cellulitis present Compartment soft  Assessment/Plan: S/p Revision left knee 11/19/17 with urosepsis  Patient is feeling better today. We greatly appreciate medical management. Blood cultures remain negative at this time. We will continue to follow knee aspiration cultures at the office. Patient is moving knee well so it does not appear infected at this time.  We will continue to hold blood thinners due to post op hemarthrosis. We will leave discharge decisions up to medical team. Follow up with Dr. Mayer Camel in office as scheduled.     Uyen Eichholz, Larwance Sachs 11/24/2017, 7:51 AM

## 2017-11-24 NOTE — Plan of Care (Signed)
  Education: Knowledge of General Education information will improve 11/24/2017 1301 - Completed/Met by Ames Dura, RN   Health Behavior/Discharge Planning: Ability to manage health-related needs will improve 11/24/2017 1301 - Completed/Met by Ames Dura, RN   Clinical Measurements: Ability to maintain clinical measurements within normal limits will improve 11/24/2017 1301 - Completed/Met by Ames Dura, RN Will remain free from infection 11/24/2017 1301 - Completed/Met by Ames Dura, RN Diagnostic test results will improve 11/24/2017 1301 - Completed/Met by Ames Dura, RN Respiratory complications will improve 11/24/2017 1301 - Completed/Met by Ames Dura, RN Cardiovascular complication will be avoided 11/24/2017 1301 - Completed/Met by Ames Dura, RN   Activity: Risk for activity intolerance will decrease 11/24/2017 1301 - Completed/Met by Ames Dura, RN   Nutrition: Adequate nutrition will be maintained 11/24/2017 1301 - Completed/Met by Ames Dura, RN   Coping: Level of anxiety will decrease 11/24/2017 1301 - Completed/Met by Ames Dura, RN   Elimination: Will not experience complications related to bowel motility 11/24/2017 1301 - Completed/Met by Ames Dura, RN Will not experience complications related to urinary retention 11/24/2017 1301 - Completed/Met by Ames Dura, RN   Pain Managment: General experience of comfort will improve 11/24/2017 1301 - Completed/Met by Ames Dura, RN   Safety: Ability to remain free from injury will improve 11/24/2017 1301 - Completed/Met by Ames Dura, RN   Skin Integrity: Risk for impaired skin integrity will decrease 11/24/2017 1301 - Completed/Met by Ames Dura, RN

## 2017-11-24 NOTE — Discharge Summary (Signed)
Physician Discharge Summary  Jacob Quinn NIO:[id number] DOB: [birth year] DOA: 11/22/2017  PCP: Dorothyann Peng, NP  Admit date: 11/22/2017 Discharge date: 11/24/2017  Admitted From: Home Disposition: Home   Recommendations for Outpatient Follow-up:  1. Follow up with PCP and orthopedics. 2. Please follow up on the following pending results: Blood cultures (1 of 4 bottles grew CoNS, thought to be contaminant). Will follow up E. coli urine culture susceptibilities, sent out on empiric keflex.   Home Health: None Equipment/Devices: None Discharge Condition: Stable CODE STATUS: Full Diet recommendation: Heart healthy  Brief/Interim Summary: Jacob Quinn is a 62 y.o. male with recent revision of left total knee 1/21 discharged in stable condition 1/22 who presented on the advice of orthopedics for fever to 102F and lethargy at home. At the ortho office knee XR was stable and the joint was aspirated showing hemarthrosis, culture sent. He exhibited urinary frequency, urgency, incontinence and dysuria, referred to the ED where the patient was febrile, UA demonstrated pyuria, and aztreonam, levaquin were administered with IV fluids. Antibiotics converted to ceftriaxone with ongoing improvement in fever curve, resolution of leukocytosis, and pt feeling much better. Urine culture has grown E. coli with susceptibilities pending at discharge.   Discharge Diagnoses:  Active Problems:   Post op infection  Sepsis due to E. coli UTI:  - Converted to ceftriaxone for UTI (review of records indicates tolerance of 1st and 2nd gen cephalosporins). Will complete 7 days of keflex.  - Monitor blood cultures - Monitor urine culture   BPH: Good UOP. No retention.   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 Held heparin DVT ppx.   - Appreciate orthopedics evaluation,  continue routine postop care with follow up.  - Will monitor joint aspirate culture  Stage III CKD: - Monitor SCr which is at baseline - Continue ACE inhibitor, spiro  CAD s/p PTCA/DES to OM: No chest pain. LHC 07/2016 - mild mid anterolateral focal hypocontractility felt to be due to the circumflex stenosis, EF 50%. - Continue DAPT, BB, prn NTG - Continue zetia  HTN:  - Continue norvasc, lisinopril, metoprolol, spironolactone  Discharge Instructions Discharge Instructions    Diet - low sodium heart healthy   Complete by:  As directed    Discharge instructions   Complete by:  As directed    Continue taking keflex twice daily for 6 more days. If the urine or blood cultures result anything that changes management, I will contact you. If your symptoms return, seek medical attention right away. Follow up with your PCP and orthopedics.   Increase activity slowly   Complete by:  As directed      Allergies as of 11/24/2017      Reactions   Penicillins Anaphylaxis   *Has tolerated cephalosporins in the past* Has patient had a PCN reaction causing immediate rash, facial/tongue/throat swelling, SOB or lightheadedness with hypotension: Yes Has patient had a PCN reaction causing severe rash involving mucus membranes or skin necrosis: No Has patient had a PCN reaction that required hospitalization: No Has patient had a PCN reaction occurring within the last 10 years: No If all of the above answers are "NO", then may proceed with Cephalosporin use.   Tizanidine Other (See Comments)   High fevers, shaking      Medication List    TAKE these medications   amLODipine 10 MG tablet Commonly known as:  NORVASC TAKE 1 TABLET (10 MG TOTAL) BY MOUTH DAILY.   aspirin EC 325 MG tablet Take 1 tablet (325 mg total) by mouth 2 (two) times daily.   atorvastatin 80 MG tablet Commonly known as:  LIPITOR TAKE 1 TABLET (80 MG TOTAL) BY MOUTH EVERY EVENING.   cephALEXin 500 MG capsule Commonly  known as:  KEFLEX Take 1 capsule (500 mg total) by mouth 2 (two) times daily.   Evolocumab 140 MG/ML Soaj Commonly known as:  REPATHA SURECLICK Inject 403 mg into the skin every 14 (fourteen) days.   ezetimibe 10 MG tablet Commonly known as:  ZETIA Take 1 tablet (10 mg total) by mouth daily.   lisinopril 40 MG tablet Commonly known as:  PRINIVIL,ZESTRIL Take 1 tablet (40 mg total) by mouth daily.   metoprolol succinate 25 MG 24 hr tablet Commonly known as:  TOPROL XL Take 1 tablet (25 mg total) by mouth daily.   MULTI VITAMIN DAILY Tabs Take 1 tablet by mouth daily.   nitroGLYCERIN 0.4 MG SL tablet Commonly known as:  NITROSTAT Place 1 tablet (0.4 mg total) under the tongue every 5 (five) minutes as needed for chest pain. Up to 3 doses.   oxyCODONE-acetaminophen 5-325 MG tablet Commonly known as:  PERCOCET/ROXICET Take 1 tablet by mouth every 4 (four) hours as needed for severe pain.   spironolactone 25 MG tablet Commonly known as:  ALDACTONE Take 25 mg by mouth daily.   ticagrelor 90 MG Tabs tablet Commonly known as:  BRILINTA Take 1 tablet (90 mg total) by mouth every 12 (twelve) hours.   tiZANidine 2 MG tablet Commonly known as:  ZANAFLEX Take 1 tablet (2 mg total) by mouth every 6 (six) hours as needed for muscle spasms.   traMADol 50 MG tablet Commonly known as:  ULTRAM Take 1 tablet (50 mg total) by mouth every 8 (eight) hours as needed.      Follow-up Information    [facility] Follow up.   Specialty:  [place] Why:  [facility] will resume your Home Health therapy, you will receive a call to arrange resumption date and time.   Contact information: [address] [place] [address] [phone]        Dorothyann Peng, NP Follow up.   Specialty:  Family Medicine Contact information: [place] [address] [phone]          Allergies  Allergen Reactions  . Penicillins Anaphylaxis     *Has tolerated cephalosporins in the past*  Has patient had a PCN reaction causing immediate rash, facial/tongue/throat swelling, SOB or lightheadedness with hypotension: Yes Has patient had a PCN reaction causing severe rash involving mucus membranes or skin necrosis: No Has patient had a PCN reaction that required hospitalization: No Has patient had a PCN reaction occurring within the last 10 years: No If all of the above answers are "NO", then may proceed with Cephalosporin use.   . Tizanidine Other (See Comments)    High fevers, shaking    Consultations:  Orthopedics  Procedures/Studies: Dg Chest 2 View  Result Date: 11/22/2017 CLINICAL DATA:  Code sepsis fever EXAM: CHEST  2 VIEW COMPARISON:  11/14/2017 FINDINGS: The heart size and mediastinal contours are within normal limits. Both lungs are clear. The visualized skeletal structures are unremarkable. IMPRESSION: No active cardiopulmonary disease. Electronically Signed   By: Donavan Foil M.D.   On: 11/22/2017 20:14   Dg Chest 2 View  Result  Date: 11/14/2017 CLINICAL DATA:  Preoperative examination prior to knee replacement. History of hypertension, coronary artery disease with stent placement, previous MI, history of rheumatic fever, never smoked. EXAM: CHEST  2 VIEW COMPARISON:  Chest x-ray of August 07, 2016 FINDINGS: The lungs are well-expanded and clear. The heart and pulmonary vascularity are normal. The mediastinum is normal in width. The trachea is midline. The bony thorax is unremarkable. IMPRESSION: There is no pneumonia nor other acute cardiopulmonary abnormality. Electronically Signed   By: David  Martinique M.D.   On: 11/14/2017 12:48   Dg Knee Complete 4 Views Left  Result Date: 11/19/2017 CLINICAL DATA:  Status post left total knee joint revision. EXAM: DG C-ARM 61-120 MIN; LEFT KNEE - COMPLETE 4+ VIEW COMPARISON:  CT scan of the left knee of August 27, 2017 FINDINGS: Six fluoro spot radiographs are reviewed. The fluoro  time reported is 40 seconds. The patient has undergone left total knee prosthesis removal and replacement. Radiographic positioning of the new prosthetic components is good. The interface with the native bone appears normal. IMPRESSION: No immediate postprocedure complication following revision of a left total knee joint prosthesis. Electronically Signed   By: David  Martinique M.D.   On: 11/19/2017 14:17   Dg C-arm 1-60 Min  Result Date: 11/19/2017 CLINICAL DATA:  Status post left total knee joint revision. EXAM: DG C-ARM 61-120 MIN; LEFT KNEE - COMPLETE 4+ VIEW COMPARISON:  CT scan of the left knee of August 27, 2017 FINDINGS: Six fluoro spot radiographs are reviewed. The fluoro time reported is 40 seconds. The patient has undergone left total knee prosthesis removal and replacement. Radiographic positioning of the new prosthetic components is good. The interface with the native bone appears normal. IMPRESSION: No immediate postprocedure complication following revision of a left total knee joint prosthesis. Electronically Signed   By: David  Martinique M.D.   On: 11/19/2017 14:17   Dg C-arm 1-60 Min  Result Date: 11/19/2017 CLINICAL DATA:  Status post left total knee joint revision. EXAM: DG C-ARM 61-120 MIN; LEFT KNEE - COMPLETE 4+ VIEW COMPARISON:  CT scan of the left knee of August 27, 2017 FINDINGS: Six fluoro spot radiographs are reviewed. The fluoro time reported is 40 seconds. The patient has undergone left total knee prosthesis removal and replacement. Radiographic positioning of the new prosthetic components is good. The interface with the native bone appears normal. IMPRESSION: No immediate postprocedure complication following revision of a left total knee joint prosthesis. Electronically Signed   By: David  Martinique M.D.   On: 11/19/2017 14:17   Subjective: Feels well "I'll do jumping jacks or whatever to get out of here." No further fevers, urinary symptoms resolved.   Discharge Exam: Vitals:    11/24/17 0340 11/24/17 0558  BP:  131/73  Pulse:  77  Resp:  17  Temp: 99.2 F (37.3 C) 98.9 F (37.2 C)  SpO2:  100%   General: Well-appearing, alert male in no distress Cardiovascular: RRR, S1/S2 +, no rubs, no gallops Respiratory: CTA bilaterally, no wheezing, no rhonchi Abdominal: Soft, NT, ND, bowel sounds + Extremities: Warm, RLE scars over knee noted; left knee wrapped in ACE with intact ROM that does not elicit severe pain  Labs: Basic Metabolic Panel: Recent Labs  Lab 11/20/17 0746 11/22/17 1942 11/22/17 2214 11/23/17 0423 11/24/17 0448  NA 136 133*  --  138 132*  K 4.3 4.1  --  3.9 3.8  CL 101 97*  --  104 99*  CO2 24 24  --  24 22  GLUCOSE 127* 120*  --  101* 94  BUN 12 12  --  13 12  CREATININE 1.13 1.33* 1.17 1.11 1.15  CALCIUM 8.7* 8.7*  --  8.0* 8.4*   Liver Function Tests: Recent Labs  Lab 11/22/17 1942  AST 25  ALT 20  ALKPHOS 91  BILITOT 0.8  PROT 7.2  ALBUMIN 3.3*   CBC: Recent Labs  Lab 11/20/17 0746 11/22/17 1942 11/22/17 2214 11/23/17 0423 11/24/17 0448  WBC 19.6* 15.5* 13.5* 11.1* 8.2  NEUTROABS  --  12.8*  --   --   --   HGB 10.8* 10.2* 8.8* 9.2* 9.1*  HCT 33.5* 32.2* 27.6* 28.5* 28.4*  MCV 89.3 89.7 89.3 89.6 89.6  PLT 269 266 225 239 262   Urinalysis    Component Value Date/Time   COLORURINE YELLOW 11/22/2017 1949   APPEARANCEUR HAZY (A) 11/22/2017 1949   LABSPEC 1.014 11/22/2017 1949   PHURINE 8.0 11/22/2017 1949   GLUCOSEU NEGATIVE 11/22/2017 1949   HGBUR SMALL (A) 11/22/2017 1949   HGBUR negative 07/09/2009 0853   BILIRUBINUR NEGATIVE 11/22/2017 1949   BILIRUBINUR n 09/22/2015 1613   KETONESUR NEGATIVE 11/22/2017 1949   PROTEINUR 30 (A) 11/22/2017 1949   UROBILINOGEN 0.2 09/22/2015 1613   UROBILINOGEN 0.2 07/09/2009 0853   NITRITE POSITIVE (A) 11/22/2017 1949   LEUKOCYTESUR MODERATE (A) 11/22/2017 1949    Microbiology Recent Results (from the past 240 hour(s))  Aerobic/Anaerobic Culture (surgical/deep wound)      Status: None   Collection Time: 11/19/17 10:50 AM  Result Value Ref Range Status   Specimen Description SYNOVIAL LEFT KNEE  Final   Special Requests SPECIMEN ON SWABS  Final   Gram Stain NO WBC SEEN NO ORGANISMS SEEN   Final   Culture No growth aerobically or anaerobically.  Final   Report Status 11/24/2017 FINAL  Final  Aerobic/Anaerobic Culture (surgical/deep wound)     Status: None   Collection Time: 11/19/17 10:55 AM  Result Value Ref Range Status   Specimen Description TISSUE LEFT KNEE  Final   Special Requests NONE  Final   Gram Stain NO WBC SEEN NO ORGANISMS SEEN   Final   Culture No growth aerobically or anaerobically.  Final   Report Status 11/24/2017 FINAL  Final  Aerobic/Anaerobic Culture (surgical/deep wound)     Status: None   Collection Time: 11/19/17 11:49 AM  Result Value Ref Range Status   Specimen Description TISSUE LEFT KNEE FROM PROTHESIS  Final   Special Requests NONE  Final   Gram Stain NO WBC SEEN NO ORGANISMS SEEN   Final   Culture No growth aerobically or anaerobically.  Final   Report Status 11/24/2017 FINAL  Final  Culture, blood (Routine x 2)     Status: None (Preliminary result)   Collection Time: 11/22/17  7:35 PM  Result Value Ref Range Status   Specimen Description BLOOD RIGHT ARM  Final   Special Requests   Final    BOTTLES DRAWN AEROBIC AND ANAEROBIC Blood Culture adequate volume   Culture  Setup Time   Final    GRAM POSITIVE COCCI ANAEROBIC BOTTLE ONLY Organism ID to follow    Culture GRAM POSITIVE COCCI  Final   Report Status PENDING  Incomplete  Blood Culture ID Panel (Reflexed)     Status: Abnormal   Collection Time: 11/22/17  7:35 PM  Result Value Ref Range Status   Enterococcus species NOT DETECTED NOT DETECTED Final   Listeria monocytogenes NOT DETECTED  NOT DETECTED Final   Staphylococcus species DETECTED (A) NOT DETECTED Final    Comment: Methicillin (oxacillin) susceptible coagulase negative staphylococcus. Possible blood  culture contaminant (unless isolated from more than one blood culture draw or clinical case suggests pathogenicity). No antibiotic treatment is indicated for blood  culture contaminants. CRITICAL RESULT CALLED TO, READ BACK BY AND VERIFIED WITH: RACHEL RUMBERGER PHARMD AT [id number] ON [id number] BY [facility]    Staphylococcus aureus NOT DETECTED NOT DETECTED Final   Methicillin resistance NOT DETECTED NOT DETECTED Final   Streptococcus species NOT DETECTED NOT DETECTED Final   Streptococcus agalactiae NOT DETECTED NOT DETECTED Final   Streptococcus pneumoniae NOT DETECTED NOT DETECTED Final   Streptococcus pyogenes NOT DETECTED NOT DETECTED Final   Acinetobacter baumannii NOT DETECTED NOT DETECTED Final   Enterobacteriaceae species NOT DETECTED NOT DETECTED Final   Enterobacter cloacae complex NOT DETECTED NOT DETECTED Final   Escherichia coli NOT DETECTED NOT DETECTED Final   Klebsiella oxytoca NOT DETECTED NOT DETECTED Final   Klebsiella pneumoniae NOT DETECTED NOT DETECTED Final   Proteus species NOT DETECTED NOT DETECTED Final   Serratia marcescens NOT DETECTED NOT DETECTED Final   Haemophilus influenzae NOT DETECTED NOT DETECTED Final   Neisseria meningitidis NOT DETECTED NOT DETECTED Final   Pseudomonas aeruginosa NOT DETECTED NOT DETECTED Final   Candida albicans NOT DETECTED NOT DETECTED Final   Candida glabrata NOT DETECTED NOT DETECTED Final   Candida krusei NOT DETECTED NOT DETECTED Final   Candida parapsilosis NOT DETECTED NOT DETECTED Final   Candida tropicalis NOT DETECTED NOT DETECTED Final  Urine culture     Status: Abnormal (Preliminary result)   Collection Time: 11/22/17  7:44 PM  Result Value Ref Range Status   Specimen Description URINE, RANDOM  Final   Special Requests NONE  Final   Culture (A)  Final    >=100,000 COLONIES/mL ESCHERICHIA COLI SUSCEPTIBILITIES TO FOLLOW    Report Status PENDING  Incomplete  Culture, blood (Routine x 2)     Status: None (Preliminary result)    Collection Time: 11/22/17  7:45 PM  Result Value Ref Range Status   Specimen Description BLOOD LEFT ARM  Final   Special Requests IN PEDIATRIC BOTTLE Blood Culture adequate volume  Final   Culture NO GROWTH < 24 HOURS  Final   Report Status PENDING  Incomplete    Time coordinating discharge: Approximately 40 minutes  Vance Gather, MD  [facility] 11/24/2017, 12:29 PM Pager [phone]

## 2017-11-25 LAB — URINE CULTURE: Culture: >=100000 — AB

## 2017-11-26 LAB — CULTURE, BLOOD (ROUTINE X 2): SPECIAL REQUESTS: ADEQUATE

## 2017-11-27 LAB — CULTURE, BLOOD (ROUTINE X 2)
Culture: NO GROWTH
Special Requests: ADEQUATE

## 2017-11-30 ENCOUNTER — Encounter: Payer: Self-pay | Admitting: Adult Health

## 2017-11-30 ENCOUNTER — Ambulatory Visit: Payer: BLUE CROSS/BLUE SHIELD | Admitting: Adult Health

## 2017-11-30 VITALS — BP 158/82 | HR 100 | Temp 97.2°F | Ht 72.0 in | Wt 242.0 lb

## 2017-11-30 DIAGNOSIS — N3000 Acute cystitis without hematuria: Secondary | ICD-10-CM

## 2017-11-30 LAB — POCT URINALYSIS DIPSTICK
BILIRUBIN UA: NEGATIVE
Blood, UA: NEGATIVE
GLUCOSE UA: NEGATIVE
LEUKOCYTES UA: NEGATIVE
Nitrite, UA: NEGATIVE
Odor: NEGATIVE
Urobilinogen, UA: 0.2 E.U./dL
pH, UA: 5.5 (ref 5.0–8.0)

## 2017-11-30 LAB — COMPREHENSIVE METABOLIC PANEL
ALT: 35 U/L (ref 0–53)
AST: 30 U/L (ref 0–37)
Albumin: 4 g/dL (ref 3.5–5.2)
Alkaline Phosphatase: 93 U/L (ref 39–117)
BILIRUBIN TOTAL: 0.5 mg/dL (ref 0.2–1.2)
BUN: 11 mg/dL (ref 6–23)
CO2: 30 mEq/L (ref 19–32)
CREATININE: 1.12 mg/dL (ref 0.40–1.50)
Calcium: 9.6 mg/dL (ref 8.4–10.5)
Chloride: 100 mEq/L (ref 96–112)
GFR: 85.04 mL/min (ref >60.00)
GLUCOSE: 104 mg/dL — AB (ref 70–99)
Potassium: 4.7 mEq/L (ref 3.5–5.1)
SODIUM: 138 meq/L (ref 135–145)
Total Protein: 7.8 g/dL (ref 6.0–8.3)

## 2017-11-30 NOTE — Progress Notes (Signed)
Subjective:    Patient ID: Jacob Quinn, male    DOB: 03-09-1956, 62 y.o.   MRN: [id number]  HPI 62 year old male who  has a past medical history of 1st degree AV block, BPH associated with nocturia, CAD in native artery, Encephalitis, viral, Hyperlipidemia (08/07/2016), Hypertension, Ischemic cardiomyopathy, OA (osteoarthritis), Obesity (BMI 30-39.9) (08/07/2016), Renal insufficiency, Restless leg syndrome (09/16/2010), Rheumatic fever, and Syncope (2009).  He presents to the clinic today for hospital follow up  He was admitted on 11/22/2017 He was discharged on 11/22/2017  Recent revision of left total knee on 121 discharged on 122 in stable condition. He presented to the emergency room at the advice of orthopedics for fever of 102 and lethargy at home.. The dorsal office the knee x-ray was stable and the joint was aspirated showing hemarthrosis, culture sent. He exhibited urinary frequency, urgency, incontinence, and dysuria. UA demonstrated pyuria and Levaquin as well asaztreonam administrated with IV fluids. Antibiotics were converted to ceftriaxone with ongoing improvement in fever resolution of leukocytosis. Urine culture grew Escherichia coli   Today in the office he reports that he is feeling much improved, continues to have an additional dose of antibiotics to finish tonight. No longer is experiencing fevers, dysuria, urgency, or frequency. He has as though his recovery of left total knee is progressing well, he'll be seen by orthopedics on Monday at which time he reports that they will tap his knee again and sent fluid for culture.  He has no acute complaints during today's visit  Review of Systems See HPI   Past Medical History:  Diagnosis Date  . 1st degree AV block   . BPH associated with nocturia   . CAD in native artery    a. NSTEMI 07/2016 -  LHC 08/07/16 showing 70% D1, 20% mLAD, segmental 95% then 75% OM stenosis, 85% acute marg, normal LVEDP - received PTCA/DES to OM.   Marland Kitchen Encephalitis, viral   . Hyperlipidemia 08/07/2016  . Hypertension   . Ischemic cardiomyopathy    a. LHC 07/2016 - mild mid anterolateral focal hypocontractility felt to be due to the circumflex stenosis, EF 50%.  . OA (osteoarthritis)   . Obesity (BMI 30-39.9) 08/07/2016  . Renal insufficiency    a. 2016 r/t dehydration/ACEI use.  Marland Kitchen Restless leg syndrome 09/16/2010  . Rheumatic fever   . Syncope 2009   associated with viral URI    Social History   Socioeconomic History  . Marital status: Single    Spouse name: Not on file  . Number of children: 3  . Years of education: Not on file  . Highest education level: Not on file  Social Needs  . Financial resource strain: Not on file  . Food insecurity - worry: Not on file  . Food insecurity - inability: Not on file  . Transportation needs - medical: Not on file  . Transportation needs - non-medical: Not on file  Occupational History  . Occupation: English as a second language teacher: [employer]    Comment: [name]  Tobacco Use  . Smoking status: Never Smoker  . Smokeless tobacco: Never Used  Substance and Sexual Activity  . Alcohol use: No  . Drug use: No  . Sexual activity: Yes  Other Topics Concern  . Not on file  Social History Narrative   Lives with daughter   Is retired -     Past Surgical History:  Procedure Laterality Date  . CARDIAC CATHETERIZATION N/A 08/07/2016  Procedure: Left Heart Cath and Coronary Angiography;  Surgeon: Troy Sine, MD;  Location: [facility];  Service: Cardiovascular;  Laterality: N/A;  . CARDIAC CATHETERIZATION N/A 08/07/2016   Procedure: Coronary Stent Intervention;  Surgeon: Troy Sine, MD;  Location: [facility];  Service: Cardiovascular;  Laterality: N/A;  . REPLACEMENT TOTAL KNEE     left and right-  5 total   . TONSILLECTOMY    . TOTAL KNEE REVISION Left 11/19/2017   Procedure: LEFT TOTAL KNEE REVISION;  Surgeon: Frederik Pear, MD;  Location: [facility];   Service: Orthopedics;  Laterality: Left;    Family History  Problem Relation Age of Onset  . Hypertension Mother   . Heart disease Mother   . Prostate cancer Father   . Dementia Father   . Depression Brother   . Hypertension Brother   . Hypertension Brother   . Stroke Brother     Allergies  Allergen Reactions  . Penicillins Anaphylaxis    *Has tolerated cephalosporins in the past*  Has patient had a PCN reaction causing immediate rash, facial/tongue/throat swelling, SOB or lightheadedness with hypotension: Yes Has patient had a PCN reaction causing severe rash involving mucus membranes or skin necrosis: No Has patient had a PCN reaction that required hospitalization: No Has patient had a PCN reaction occurring within the last 10 years: No If all of the above answers are "NO", then may proceed with Cephalosporin use.   . Tizanidine Other (See Comments)    High fevers, shaking    Current Outpatient Medications on File Prior to Visit  Medication Sig Dispense Refill  . amLODipine (NORVASC) 10 MG tablet TAKE 1 TABLET (10 MG TOTAL) BY MOUTH DAILY. 30 tablet 6  . aspirin EC 325 MG tablet Take 1 tablet (325 mg total) by mouth 2 (two) times daily. 30 tablet 0  . atorvastatin (LIPITOR) 80 MG tablet TAKE 1 TABLET (80 MG TOTAL) BY MOUTH EVERY EVENING. 30 tablet 6  . Evolocumab (REPATHA SURECLICK) 443 MG/ML SOAJ Inject 140 mg into the skin every 14 (fourteen) days. 2 pen 12  . metoprolol succinate (TOPROL XL) 25 MG 24 hr tablet Take 1 tablet (25 mg total) by mouth daily. 90 tablet 3  . Multiple Vitamin (MULTI VITAMIN DAILY) TABS Take 1 tablet by mouth daily.    . nitroGLYCERIN (NITROSTAT) 0.4 MG SL tablet Place 1 tablet (0.4 mg total) under the tongue every 5 (five) minutes as needed for chest pain. Up to 3 doses. 25 tablet 3  . oxyCODONE-acetaminophen (PERCOCET/ROXICET) 5-325 MG tablet Take 1 tablet by mouth every 4 (four) hours as needed for severe pain. 30 tablet 0  . spironolactone  (ALDACTONE) 25 MG tablet Take 25 mg by mouth daily.  2  . ticagrelor (BRILINTA) 90 MG TABS tablet Take 1 tablet (90 mg total) by mouth every 12 (twelve) hours. 180 tablet 3  . traMADol (ULTRAM) 50 MG tablet Take 1 tablet (50 mg total) by mouth every 8 (eight) hours as needed. 60 tablet 0  . ezetimibe (ZETIA) 10 MG tablet Take 1 tablet (10 mg total) by mouth daily. 90 tablet 3  . lisinopril (PRINIVIL,ZESTRIL) 40 MG tablet Take 1 tablet (40 mg total) by mouth daily. 90 tablet 3   No current facility-administered medications on file prior to visit.     BP (!) 158/82 (BP Location: Left Arm, Patient Position: Sitting, Cuff Size: Normal)   Pulse 100   Temp (!) 97.2 F (36.2 C) (Oral)  Ht 6' (1.829 m)   Wt 242 lb (109.8 kg)   SpO2 98%   BMI 32.82 kg/m       Objective:   Physical Exam  Constitutional: He is oriented to person, place, and time. He appears well-developed and well-nourished. No distress.  Cardiovascular: Normal rate, regular rhythm, normal heart sounds and intact distal pulses. Exam reveals no gallop and no friction rub.  No murmur heard. Pulmonary/Chest: Effort normal and breath sounds normal. No respiratory distress. He has no wheezes. He has no rales. He exhibits no tenderness.  Abdominal: Normal appearance. There is no CVA tenderness.  Musculoskeletal: He exhibits tenderness (Left knee). He exhibits no edema or deformity.  Using crutches  Neurological: He is alert and oriented to person, place, and time.  Skin: Skin is warm and dry. No rash noted. He is not diaphoretic. No erythema. No pallor.  Well-healing surgical scar to left knee  Psychiatric: He has a normal mood and affect. His behavior is normal. Judgment and thought content normal.  Nursing note and vitals reviewed.     Assessment & Plan:  1. Acute cystitis without hematuria - Will check UA and urine culture to make sure resolution of UTI has been completed. Reviewed blood culture readings of hospital  admission, negative blood cultures - Culture, Urine - CMP - POC Urinalysis Dipstick - Follow-up as needed  Dorothyann Peng, NP

## 2017-12-01 LAB — URINE CULTURE
MICRO NUMBER: 90139909
Result:: NO GROWTH
SPECIMEN QUALITY:: ADEQUATE

## 2017-12-03 ENCOUNTER — Encounter: Payer: Self-pay | Admitting: Family Medicine

## 2018-01-02 ENCOUNTER — Telehealth: Payer: Self-pay | Admitting: Physician Assistant

## 2018-01-02 NOTE — Telephone Encounter (Signed)
Will forward to Tommy Medal who was managing his cholesterol. The last note did not indicate plan to dc atorvastatin or Zetia but reported that she was getting paperwork started on Repatha. I am not seeing any updated notes about the plan for repeating values or instruction on statin/Zetia. Kristofor Michalowski PA-C

## 2018-01-02 NOTE — Telephone Encounter (Signed)
Spoke with pt re: Atorvastatin. Pt states that he thought when he was put on Repatha, that he was supposed to d/c the Atorvastatin.  Pt confirms that he hasn't taken Atorvastatin since November.  Please advise!

## 2018-01-02 NOTE — Telephone Encounter (Signed)
Rec'd fax from [employer] indicating he is not on statin. Our records show atorvastatin 80mg . Please call patient to make sure no issues filling this medicine. Solomia Harrell PA-C

## 2018-01-08 ENCOUNTER — Telehealth (INDEPENDENT_AMBULATORY_CARE_PROVIDER_SITE_OTHER): Payer: Self-pay | Admitting: Orthopedic Surgery

## 2018-01-08 MED ORDER — IBUPROFEN 800 MG PO TABS: ORAL_TABLET | ORAL | 0 refills | Status: DC

## 2018-01-08 NOTE — Telephone Encounter (Signed)
He is on brilinta

## 2018-01-08 NOTE — Telephone Encounter (Signed)
Y but is he on blood thinning agent with heart issues

## 2018-01-08 NOTE — Telephone Encounter (Signed)
Ok to rf? 

## 2018-01-08 NOTE — Telephone Encounter (Signed)
Med refill  Ibprofen

## 2018-01-09 ENCOUNTER — Telehealth (INDEPENDENT_AMBULATORY_CARE_PROVIDER_SITE_OTHER): Payer: Self-pay | Admitting: Orthopedic Surgery

## 2018-01-09 NOTE — Telephone Encounter (Signed)
IC patient advised him should d/c ibuprofen. Verbalized understanding.

## 2018-01-09 NOTE — Telephone Encounter (Signed)
Patient called I read note from [place] to him advised lauren also spoke with him concerning note at 1:44pm.

## 2018-01-09 NOTE — Telephone Encounter (Signed)
No go on ibu and brillenta together plsc all thx

## 2018-01-09 NOTE — Telephone Encounter (Signed)
IC s/w patient and confirmed again.

## 2018-01-17 ENCOUNTER — Telehealth: Payer: Self-pay | Admitting: Pharmacist Clinician (PhC)/ Clinical Pharmacy Specialist

## 2018-01-17 DIAGNOSIS — E785 Hyperlipidemia, unspecified: Secondary | ICD-10-CM

## 2018-01-17 NOTE — Telephone Encounter (Signed)
LMOM for patient.  Repatha was approved to Dec 2019 and sent to [facility].  They should have contacted him.  Would recommend he use local [facility] or [facility] for filling, to avoid specialty pharmacy.

## 2018-01-17 NOTE — Telephone Encounter (Signed)
Follow up    Patient returning call. Please call to discuss.

## 2018-01-18 ENCOUNTER — Other Ambulatory Visit: Payer: Self-pay | Admitting: Pharmacist Clinician (PhC)/ Clinical Pharmacy Specialist

## 2018-01-18 DIAGNOSIS — E785 Hyperlipidemia, unspecified: Secondary | ICD-10-CM

## 2018-01-18 NOTE — Telephone Encounter (Signed)
Spoke with patient, he has been taking Repatha for 3+ months, pays $5 per month with copay card.    He will come by the office in the next week to get repeat lipid labs.

## 2018-02-04 ENCOUNTER — Other Ambulatory Visit (INDEPENDENT_AMBULATORY_CARE_PROVIDER_SITE_OTHER): Payer: Self-pay | Admitting: Orthopedic Surgery

## 2018-02-04 NOTE — Telephone Encounter (Signed)
I did not think that he could take this b/c he was on blood thinners?

## 2018-02-05 NOTE — Telephone Encounter (Signed)
No ibuprofen if he is on blood thinners

## 2018-02-11 ENCOUNTER — Telehealth (INDEPENDENT_AMBULATORY_CARE_PROVIDER_SITE_OTHER): Payer: Self-pay | Admitting: Orthopedic Surgery

## 2018-02-11 MED ORDER — IBUPROFEN 800 MG PO TABS: ORAL_TABLET | ORAL | 0 refills | Status: DC

## 2018-02-11 NOTE — Telephone Encounter (Signed)
IC s/w patient advised that we are unable to rf ibuprofen b/c he is on blood thinners. He stated that he is off blood thinners now and has been for about 3 weeks. Wants to know if ok to rf? He stated he is having some pain in his other leg and ibuprofen seems to be the only relief. Please advise if you are ok to rf or if you want him to have ROV?

## 2018-02-11 NOTE — Telephone Encounter (Signed)
rx submitted. Patient advised done.

## 2018-02-11 NOTE — Telephone Encounter (Signed)
Patient called needing Rx refilled (Ibuprofen) The number to contact patient is [phone]

## 2018-02-11 NOTE — Telephone Encounter (Signed)
Ok for rf pls call thx

## 2018-02-15 ENCOUNTER — Other Ambulatory Visit: Payer: Self-pay | Admitting: Cardiovascular Disease

## 2018-03-14 ENCOUNTER — Other Ambulatory Visit: Payer: Self-pay | Admitting: Orthopedic Surgery

## 2018-03-18 ENCOUNTER — Other Ambulatory Visit: Payer: Self-pay | Admitting: Orthopedic Surgery

## 2018-04-10 NOTE — Patient Instructions (Addendum)
Jacob Quinn  04/10/2018   Your procedure is scheduled on: 04-22-18   Report to [facility] Main  Entrance    Report to Admitting at 5:30 AM    Call this number if you have problems the morning of surgery [phone]   Remember: Do not eat food or drink liquids :After Midnight.     Take these medicines the morning of surgery with A SIP OF WATER:None                               You may not have any metal on your body including hair pins and              piercings  Do not wear jewelry, lotions, powders or deodorant             Men may shave face and neck.   Do not bring valuables to the hospital. [place].  Contacts, dentures or bridgework may not be worn into surgery.  Leave suitcase in the car. After surgery it may be brought to your room.   Special Instructions: N/A              Please read over the following fact sheets you were given: _____________________________________________________________________             [facility] - Preparing for Surgery Before surgery, you can play an important role.  Because skin is not sterile, your skin needs to be as free of germs as possible.  You can reduce the number of germs on your skin by washing with CHG (chlorahexidine gluconate) soap before surgery.  CHG is an antiseptic cleaner which kills germs and bonds with the skin to continue killing germs even after washing. Please DO NOT use if you have an allergy to CHG or antibacterial soaps.  If your skin becomes reddened/irritated stop using the CHG and inform your nurse when you arrive at Short Stay. Do not shave (including legs and underarms) for at least 48 hours prior to the first CHG shower.  You may shave your face/neck. Please follow these instructions carefully:  1.  Shower with CHG Soap the night before surgery and the  morning of Surgery.  2.  If you choose to wash your hair, wash your hair first  as usual with your  normal  shampoo.  3.  After you shampoo, rinse your hair and body thoroughly to remove the  shampoo.                           4.  Use CHG as you would any other liquid soap.  You can apply chg directly  to the skin and wash                       Gently with a scrungie or clean washcloth.  5.  Apply the CHG Soap to your body ONLY FROM THE NECK DOWN.   Do not use on face/ open                           Wound or open sores. Avoid contact with eyes, ears mouth and  genitals (private parts).                       Wash face,  Genitals (private parts) with your normal soap.             6.  Wash thoroughly, paying special attention to the area where your surgery  will be performed.  7.  Thoroughly rinse your body with warm water from the neck down.  8.  DO NOT shower/wash with your normal soap after using and rinsing off  the CHG Soap.                9.  Pat yourself dry with a clean towel.            10.  Wear clean pajamas.            11.  Place clean sheets on your bed the night of your first shower and do not  sleep with pets. Day of Surgery : Do not apply any lotions/deodorants the morning of surgery.  Please wear clean clothes to the hospital/surgery center.  FAILURE TO FOLLOW THESE INSTRUCTIONS MAY RESULT IN THE CANCELLATION OF YOUR SURGERY PATIENT SIGNATURE_________________________________  NURSE SIGNATURE__________________________________  ________________________________________________________________________   Jacob Quinn  An incentive spirometer is a tool that can help keep your lungs clear and active. This tool measures how well you are filling your lungs with each breath. Taking long deep breaths may help reverse or decrease the chance of developing breathing (pulmonary) problems (especially infection) following:  A long period of time when you are unable to move or be active. BEFORE THE PROCEDURE   If the spirometer includes an indicator to show your  best effort, your nurse or respiratory therapist will set it to a desired goal.  If possible, sit up straight or lean slightly forward. Try not to slouch.  Hold the incentive spirometer in an upright position. INSTRUCTIONS FOR USE  1. Sit on the edge of your bed if possible, or sit up as far as you can in bed or on a chair. 2. Hold the incentive spirometer in an upright position. 3. Breathe out normally. 4. Place the mouthpiece in your mouth and seal your lips tightly around it. 5. Breathe in slowly and as deeply as possible, raising the piston or the ball toward the top of the column. 6. Hold your breath for 3-5 seconds or for as long as possible. Allow the piston or ball to fall to the bottom of the column. 7. Remove the mouthpiece from your mouth and breathe out normally. 8. Rest for a few seconds and repeat Steps 1 through 7 at least 10 times every 1-2 hours when you are awake. Take your time and take a few normal breaths between deep breaths. 9. The spirometer may include an indicator to show your best effort. Use the indicator as a goal to work toward during each repetition. 10. After each set of 10 deep breaths, practice coughing to be sure your lungs are clear. If you have an incision (the cut made at the time of surgery), support your incision when coughing by placing a pillow or rolled up towels firmly against it. Once you are able to get out of bed, walk around indoors and cough well. You may stop using the incentive spirometer when instructed by your caregiver.  RISKS AND COMPLICATIONS  Take your time so you do not get dizzy or light-headed.  If you are in pain, you may  need to take or ask for pain medication before doing incentive spirometry. It is harder to take a deep breath if you are having pain. AFTER USE  Rest and breathe slowly and easily.  It can be helpful to keep track of a log of your progress. Your caregiver can provide you with a simple table to help with this. If  you are using the spirometer at home, follow these instructions: [place] IF:   You are having difficultly using the spirometer.  You have trouble using the spirometer as often as instructed.  Your pain medication is not giving enough relief while using the spirometer.  You develop fever of 100.5 F (38.1 C) or higher. SEEK IMMEDIATE MEDICAL CARE IF:   You cough up bloody sputum that had not been present before.  You develop fever of 102 F (38.9 C) or greater.  You develop worsening pain at or near the incision site. MAKE SURE YOU:   Understand these instructions.  Will watch your condition.  Will get help right away if you are not doing well or get worse. Document Released: 02/26/2007 Document Revised: 01/08/2012 Document Reviewed: 04/29/2007 ExitCare Patient Information 2014 Exit[employer], [place].   ________________________________________________________________________  WHAT IS A BLOOD TRANSFUSION? Blood Transfusion Information  A transfusion is the replacement of blood or some of its parts. Blood is made up of multiple cells which provide different functions.  Red blood cells carry oxygen and are used for blood loss replacement.  White blood cells fight against infection.  Platelets control bleeding.  Plasma helps clot blood.  Other blood products are available for specialized needs, such as hemophilia or other clotting disorders. BEFORE THE TRANSFUSION  Who gives blood for transfusions?   Healthy volunteers who are fully evaluated to make sure their blood is safe. This is blood bank blood. Transfusion therapy is the safest it has ever been in the practice of medicine. Before blood is taken from a donor, a complete history is taken to make sure that person has no history of diseases nor engages in risky social behavior (examples are intravenous drug use or sexual activity with multiple partners). The donor's travel history is screened to minimize risk of  transmitting infections, such as malaria. The donated blood is tested for signs of infectious diseases, such as HIV and hepatitis. The blood is then tested to be sure it is compatible with you in order to minimize the chance of a transfusion reaction. If you or a relative donates blood, this is often done in anticipation of surgery and is not appropriate for emergency situations. It takes many days to process the donated blood. RISKS AND COMPLICATIONS Although transfusion therapy is very safe and saves many lives, the main dangers of transfusion include:   Getting an infectious disease.  Developing a transfusion reaction. This is an allergic reaction to something in the blood you were given. Every precaution is taken to prevent this. The decision to have a blood transfusion has been considered carefully by your caregiver before blood is given. Blood is not given unless the benefits outweigh the risks. AFTER THE TRANSFUSION  Right after receiving a blood transfusion, you will usually feel much better and more energetic. This is especially true if your red blood cells have gotten low (anemic). The transfusion raises the level of the red blood cells which carry oxygen, and this usually causes an energy increase.  The nurse administering the transfusion will monitor you carefully for complications. HOME CARE INSTRUCTIONS  No special  instructions are needed after a transfusion. You may find your energy is better. Speak with your caregiver about any limitations on activity for underlying diseases you may have. SEEK MEDICAL CARE IF:   Your condition is not improving after your transfusion.  You develop redness or irritation at the intravenous (IV) site. SEEK IMMEDIATE MEDICAL CARE IF:  Any of the following symptoms occur over the next 12 hours:  Shaking chills.  You have a temperature by mouth above 102 F (38.9 C), not controlled by medicine.  Chest, back, or muscle pain.  People around you  feel you are not acting correctly or are confused.  Shortness of breath or difficulty breathing.  Dizziness and fainting.  You get a rash or develop hives.  You have a decrease in urine output.  Your urine turns a dark color or changes to pink, red, or brown. Any of the following symptoms occur over the next 10 days:  You have a temperature by mouth above 102 F (38.9 C), not controlled by medicine.  Shortness of breath.  Weakness after normal activity.  The white part of the eye turns yellow (jaundice).  You have a decrease in the amount of urine or are urinating less often.  Your urine turns a dark color or changes to pink, red, or brown. Document Released: 10/13/2000 Document Revised: 01/08/2012 Document Reviewed: 06/01/2008 [facility] Patient Information 2014 [place], [place].  _______________________________________________________________________

## 2018-04-10 NOTE — Progress Notes (Signed)
11-22-17 (Epic) EKG & CXR  08-15-17 (Epic) Stress

## 2018-04-11 ENCOUNTER — Other Ambulatory Visit: Payer: Self-pay

## 2018-04-11 ENCOUNTER — Encounter (HOSPITAL_COMMUNITY): Payer: Self-pay

## 2018-04-11 ENCOUNTER — Encounter (HOSPITAL_COMMUNITY)
Admission: RE | Admit: 2018-04-11 | Discharge: 2018-04-11 | Disposition: A | Discharge status: Home / Self Care | Payer: BLUE CROSS/BLUE SHIELD | Source: Ambulatory Visit | Attending: Orthopedic Surgery | Admitting: Orthopedic Surgery

## 2018-04-11 DIAGNOSIS — Z01818 Encounter for other preprocedural examination: Secondary | ICD-10-CM | POA: Diagnosis present

## 2018-04-11 DIAGNOSIS — Y838 Other surgical procedures as the cause of abnormal reaction of the patient, or of later complication, without mention of misadventure at the time of the procedure: Secondary | ICD-10-CM | POA: Diagnosis not present

## 2018-04-11 DIAGNOSIS — T84032A Mechanical loosening of internal right knee prosthetic joint, initial encounter: Secondary | ICD-10-CM | POA: Insufficient documentation

## 2018-04-11 LAB — APTT: APTT: 27 s (ref 24–36)

## 2018-04-11 LAB — URINALYSIS, ROUTINE W REFLEX MICROSCOPIC
Bilirubin Urine: NEGATIVE
Glucose, UA: NEGATIVE mg/dL
HGB URINE DIPSTICK: NEGATIVE
Ketones, ur: NEGATIVE mg/dL
LEUKOCYTES UA: NEGATIVE
NITRITE: NEGATIVE
PROTEIN: NEGATIVE mg/dL
Specific Gravity, Urine: 1.014 (ref 1.005–1.030)
pH: 6 (ref 5.0–8.0)

## 2018-04-11 LAB — SURGICAL PCR SCREEN
MRSA, PCR: NEGATIVE
STAPHYLOCOCCUS AUREUS: NEGATIVE

## 2018-04-11 LAB — BASIC METABOLIC PANEL
ANION GAP: 7 (ref 5–15)
BUN: 11 mg/dL (ref 6–20)
CO2: 29 mmol/L (ref 22–32)
Calcium: 9.4 mg/dL (ref 8.9–10.3)
Chloride: 104 mmol/L (ref 101–111)
Creatinine, Ser: 1.05 mg/dL (ref 0.61–1.24)
GFR calc Af Amer: >60 mL/min (ref >60)
GLUCOSE: 102 mg/dL — AB (ref 65–99)
Potassium: 4.3 mmol/L (ref 3.5–5.1)
Sodium: 140 mmol/L (ref 135–145)

## 2018-04-11 LAB — CBC WITH DIFFERENTIAL/PLATELET
BASOS ABS: 0 10*3/uL (ref 0.0–0.1)
Basophils Relative: 0 %
EOS PCT: 2 %
Eosinophils Absolute: 0.2 10*3/uL (ref 0.0–0.7)
HCT: 41.4 % (ref 39.0–52.0)
Hemoglobin: 13.4 g/dL (ref 13.0–17.0)
LYMPHS ABS: 2.2 10*3/uL (ref 0.7–4.0)
LYMPHS PCT: 29 %
MCH: 27.8 pg (ref 26.0–34.0)
MCHC: 32.4 g/dL (ref 30.0–36.0)
MCV: 85.9 fL (ref 78.0–100.0)
MONO ABS: 0.6 10*3/uL (ref 0.1–1.0)
MONOS PCT: 8 %
NEUTROS ABS: 4.6 10*3/uL (ref 1.7–7.7)
Neutrophils Relative %: 61 %
Platelets: 284 10*3/uL (ref 150–400)
RBC: 4.82 MIL/uL (ref 4.22–5.81)
RDW: 13.4 % (ref 11.5–15.5)
WBC: 7.6 10*3/uL (ref 4.0–10.5)

## 2018-04-11 LAB — ABO/RH: ABO/RH(D): O POS

## 2018-04-11 LAB — TYPE AND SCREEN
ABO/RH(D): O POS
Antibody Screen: NEGATIVE

## 2018-04-11 LAB — PROTIME-INR
INR: 0.98
Prothrombin Time: 12.9 seconds (ref 11.4–15.2)

## 2018-04-11 NOTE — Progress Notes (Signed)
Spoke to Dr. Gifford Shave to determine if cardiac clearance was needed prior to surgery. Per Dr. Gifford Shave, okay to proceed with surgery without cardiac clearance and to instruct patient to take his Amlodipine and Metoprolol prior to surgery. Pt customarily take these medication daily at 3pm. Pt  verbalized understanding.

## 2018-04-19 DIAGNOSIS — Z96659 Presence of unspecified artificial knee joint: Secondary | ICD-10-CM

## 2018-04-19 DIAGNOSIS — T8484XA Pain due to internal orthopedic prosthetic devices, implants and grafts, initial encounter: Secondary | ICD-10-CM

## 2018-04-19 NOTE — H&P (Signed)
TOTAL KNEE REVISION ADMISSION H&P  Patient is being admitted for right revision total knee arthroplasty.  Subjective:  Chief Complaint:right knee pain.  HPI: Jacob Quinn, 62 y.o. male, has a history of pain and functional disability in the right knee(s) due to failed previous arthroplasty and patient has failed non-surgical conservative treatments for greater than 12 weeks to include NSAID's and/or analgesics, flexibility and strengthening excercises, supervised PT with diminished ADL's post treatment, use of assistive devices, weight reduction as appropriate and activity modification. The indications for the revision of the total knee arthroplasty are loosening of one or more components. Onset of symptoms was gradual starting several years ago with gradually worsening course since that time.  Prior procedures on the right knee(s) include unicompartmental arthroplasty.  Patient currently rates pain in the right knee(s) at 10 out of 10 with activity. There is night pain, worsening of pain with activity and weight bearing, pain that interferes with activities of daily living and pain with passive range of motion.  Patient has evidence of prosthetic loosening by imaging studies. This condition presents safety issues increasing the risk of falls.  There is no current active infection.  Patient Active Problem List   Diagnosis Date Noted  . Post op infection 11/22/2017  . Primary osteoarthritis of left knee 11/19/2017  . 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 Presence of left artificial knee joint 12/20/2016  . Presence of right artificial knee joint 12/20/2016  . Essential hypertension 08/08/2016  . Cardiomyopathy, ischemic 08/08/2016  . NSTEMI (non-ST elevated myocardial infarction) ([place]) 08/07/2016  . Obesity (BMI 30-39.9) 08/07/2016  . Hyperlipidemia 08/07/2016  . CAD (coronary artery disease), native coronary artery 08/07/2016  . Restless leg syndrome 09/16/2010  .  Hypertensive heart disease without CHF 11/22/2007  . Osteoarthritis 11/22/2007   Past Medical History:  Diagnosis Date  . 1st degree AV block   . BPH associated with nocturia   . CAD in native artery    a. NSTEMI 07/2016 -  LHC 08/07/16 showing 70% D1, 20% mLAD, segmental 95% then 75% OM stenosis, 85% acute marg, normal LVEDP - received PTCA/DES to OM.  Marland Kitchen Encephalitis, viral   . Hyperlipidemia 08/07/2016  . Hypertension   . Ischemic cardiomyopathy    a. LHC 07/2016 - mild mid anterolateral focal hypocontractility felt to be due to the circumflex stenosis, EF 50%.  . OA (osteoarthritis)   . Obesity (BMI 30-39.9) 08/07/2016  . Renal insufficiency    a. 2016 r/t dehydration/ACEI use.  Marland Kitchen Restless leg syndrome 09/16/2010  . Rheumatic fever   . Syncope 2009   associated with viral URI    Past Surgical History:  Procedure Laterality Date  . CARDIAC CATHETERIZATION N/A 08/07/2016   Procedure: Left Heart Cath and Coronary Angiography;  Surgeon: Troy Sine, MD;  Location: [place] [facility];  Service: Cardiovascular;  Laterality: N/A;  . CARDIAC CATHETERIZATION N/A 08/07/2016   Procedure: Coronary Stent Intervention;  Surgeon: Troy Sine, MD;  Location: [place] [facility];  Service: Cardiovascular;  Laterality: N/A;  . REPLACEMENT TOTAL KNEE     left and right-  5 total   . TONSILLECTOMY    . TOTAL KNEE REVISION Left 11/19/2017   Procedure: LEFT TOTAL KNEE REVISION;  Surgeon: Frederik Pear, MD;  Location: [place];  Service: Orthopedics;  Laterality: Left;    No current facility-administered medications for this encounter.    Current Outpatient Medications  Medication Sig Dispense Refill Last Dose  . amLODipine (  NORVASC) 10 MG tablet TAKE 1 TABLET (10 MG TOTAL) BY MOUTH DAILY. 30 tablet 6 Taking  . aspirin EC 81 MG tablet Take 81 mg by mouth daily.      . Evolocumab (REPATHA SURECLICK) 229 MG/ML SOAJ Inject 140 mg into the skin every 14 (fourteen) days. 2 pen 12 Taking  . ibuprofen  (ADVIL,MOTRIN) 800 MG tablet 1 po qd to bid prn pain 60 tablet 0   . lisinopril (PRINIVIL,ZESTRIL) 40 MG tablet TAKE 1 TABLET BY MOUTH EVERY DAY 90 tablet 3   . Multiple Vitamin (MULTI VITAMIN DAILY) TABS Take 1 tablet by mouth daily.   Taking  . ticagrelor (BRILINTA) 90 MG TABS tablet Take 1 tablet (90 mg total) by mouth every 12 (twelve) hours. 180 tablet 3 Taking  . aspirin EC 325 MG tablet Take 1 tablet (325 mg total) by mouth 2 (two) times daily. (Patient not taking: Reported on 04/04/2018) 30 tablet 0 Not Taking at Unknown time  . atorvastatin (LIPITOR) 80 MG tablet TAKE 1 TABLET (80 MG TOTAL) BY MOUTH EVERY EVENING. (Patient not taking: Reported on 04/04/2018) 30 tablet 6 Not Taking at Unknown time  . ezetimibe (ZETIA) 10 MG tablet Take 1 tablet (10 mg total) by mouth daily. (Patient not taking: Reported on 04/04/2018) 90 tablet 3 Not Taking at Unknown time  . metoprolol succinate (TOPROL XL) 25 MG 24 hr tablet Take 1 tablet (25 mg total) by mouth daily. (Patient not taking: Reported on 04/04/2018) 90 tablet 3 Not Taking at Unknown time  . nitroGLYCERIN (NITROSTAT) 0.4 MG SL tablet Place 1 tablet (0.4 mg total) under the tongue every 5 (five) minutes as needed for chest pain. Up to 3 doses. 25 tablet 3 Taking  . oxyCODONE-acetaminophen (PERCOCET/ROXICET) 5-325 MG tablet Take 1 tablet by mouth every 4 (four) hours as needed for severe pain. (Patient not taking: Reported on 04/04/2018) 30 tablet 0 Not Taking at Unknown time  . spironolactone (ALDACTONE) 25 MG tablet TAKE 1/2 TABLET BY MOUTH TWICE A DAY (Patient not taking: Reported on 04/04/2018) 90 tablet 3 Not Taking at Unknown time  . traMADol (ULTRAM) 50 MG tablet Take 1 tablet (50 mg total) by mouth every 8 (eight) hours as needed. (Patient not taking: Reported on 04/04/2018) 60 tablet 0 Not Taking at Unknown time   Allergies  Allergen Reactions  . Penicillins Anaphylaxis    *Has tolerated cephalosporins in the past*  Has patient had a PCN reaction  causing immediate rash, facial/tongue/throat swelling, SOB or lightheadedness with hypotension: Yes Has patient had a PCN reaction causing severe rash involving mucus membranes or skin necrosis: No Has patient had a PCN reaction that required hospitalization: No Has patient had a PCN reaction occurring within the last 10 years: No If all of the above answers are "NO", then may proceed with Cephalosporin use.   . Tizanidine Other (See Comments)    High fevers, shaking  . Latex Rash    Social History   Tobacco Use  . Smoking status: Never Smoker  . Smokeless tobacco: Never Used  Substance Use Topics  . Alcohol use: No    Family History  Problem Relation Age of Onset  . Hypertension Mother   . Heart disease Mother   . Prostate cancer Father   . Dementia Father   . Depression Brother   . Hypertension Brother   . Hypertension Brother   . Stroke Brother       Review of Systems  Constitutional: Negative.   HENT:  Negative.   Eyes: Negative.   Respiratory: Negative.   Cardiovascular:       HTN  Gastrointestinal: Negative.   Genitourinary: Positive for frequency.  Musculoskeletal: Positive for joint pain.  Skin: Negative.   Neurological: Negative.   Endo/Heme/Allergies: Negative.   Psychiatric/Behavioral: Negative.      Objective:  Physical Exam  Constitutional: He is oriented to person, place, and time. He appears well-developed and well-nourished.  HENT:  Head: Normocephalic and atraumatic.  Eyes: Pupils are equal, round, and reactive to light.  Neck: Normal range of motion. Neck supple.  Cardiovascular: Intact distal pulses.  Respiratory: Effort normal.  Musculoskeletal: He exhibits tenderness.  Tender along the medial joint line of the right knee range of motion is from 3-110.  One plus laxity to valgus stress.  The contralateral left knee which had the very large revision procedure continues to heal.  There is no fluid.  This is a hinged long stem prosthesis range  of motion.  There is from 0-105.    Neurological: He is alert and oriented to person, place, and time.  Skin: Skin is warm and dry.  Psychiatric: He has a normal mood and affect. His behavior is normal. Judgment and thought content normal.    Vital signs in last 24 hours:    Labs:  Estimated body mass index is 32.62 kg/m as calculated from the following:   Height as of 04/11/18: 6\' 1"  (1.854 m).   Weight as of 04/11/18: 112.2 kg (247 lb 4 oz).  Imaging Review Plain radiographs demonstrate bilateral AP weightbearing, and lateral views of the left knee are taken and reviewed in office today.  This shows a long stem hinged implant left knee.    Patient's right knee does have a unicompartmental knee replacement.   Preoperative templating of the joint replacement has been completed, documented, and submitted to the Operating Room personnel in order to optimize intra-operative equipment management.   Assessment/Plan:  End stage arthritis, right knee(s) with failed previous arthroplasty.   The patient history, physical examination, clinical judgment of the provider and imaging studies are consistent with end stage degenerative joint disease of the right knee(s), previous total knee arthroplasty. Revision total knee arthroplasty is deemed medically necessary. The treatment options including medical management, injection therapy, arthroscopy and revision arthroplasty were discussed at length. The risks and benefits of revision total knee arthroplasty were presented and reviewed. The risks due to aseptic loosening, infection, stiffness, patella tracking problems, thromboembolic complications and other imponderables were discussed. The patient acknowledged the explanation, agreed to proceed with the plan and consent was signed. Patient is being admitted for inpatient treatment for surgery, pain control, PT, OT, prophylactic antibiotics, VTE prophylaxis, progressive ambulation and ADL's and discharge  planning.The patient is planning to be discharged home with home health services.

## 2018-04-21 MED ORDER — VANCOMYCIN HCL 10 G IV SOLR
1500.0000 mg | INTRAVENOUS | Status: AC
  Administered 2018-04-22: 1500 mg via INTRAVENOUS
  Filled 2018-04-21: qty 1500

## 2018-04-21 MED ORDER — TRANEXAMIC ACID 1000 MG/10ML IV SOLN
2000.0000 mg | INTRAVENOUS | Status: DC
  Filled 2018-04-21: qty 20

## 2018-04-21 MED ORDER — TRANEXAMIC ACID 1000 MG/10ML IV SOLN
1000.0000 mg | INTRAVENOUS | Status: AC
  Administered 2018-04-22: 1000 mg via INTRAVENOUS
  Filled 2018-04-21: qty 1100

## 2018-04-21 MED ORDER — BUPIVACAINE LIPOSOME 1.3 % IJ SUSP
20.0000 mL | INTRAMUSCULAR | Status: DC
  Filled 2018-04-21: qty 20

## 2018-04-22 ENCOUNTER — Inpatient Hospital Stay (HOSPITAL_COMMUNITY): Payer: BLUE CROSS/BLUE SHIELD | Admitting: Anesthesiology

## 2018-04-22 ENCOUNTER — Inpatient Hospital Stay (HOSPITAL_COMMUNITY): Payer: BLUE CROSS/BLUE SHIELD

## 2018-04-22 ENCOUNTER — Inpatient Hospital Stay (HOSPITAL_COMMUNITY)
Admission: RE | Admit: 2018-04-22 | Discharge: 2018-04-23 | DRG: 467 | Disposition: A | Discharge status: Home / Self Care | Payer: BLUE CROSS/BLUE SHIELD | Attending: Orthopedic Surgery | Admitting: Orthopedic Surgery

## 2018-04-22 ENCOUNTER — Encounter (HOSPITAL_COMMUNITY)
Admission: RE | Disposition: A | Discharge status: Home / Self Care | Payer: Self-pay | Source: Home / Self Care | Attending: Orthopedic Surgery

## 2018-04-22 ENCOUNTER — Encounter (HOSPITAL_COMMUNITY): Payer: Self-pay | Admitting: *Deleted

## 2018-04-22 ENCOUNTER — Other Ambulatory Visit: Payer: Self-pay

## 2018-04-22 DIAGNOSIS — Y92009 Unspecified place in unspecified non-institutional (private) residence as the place of occurrence of the external cause: Secondary | ICD-10-CM | POA: Diagnosis not present

## 2018-04-22 DIAGNOSIS — Z6832 Body mass index (BMI) 32.0-32.9, adult: Secondary | ICD-10-CM | POA: Diagnosis not present

## 2018-04-22 DIAGNOSIS — I252 Old myocardial infarction: Secondary | ICD-10-CM | POA: Diagnosis not present

## 2018-04-22 DIAGNOSIS — Z8042 Family history of malignant neoplasm of prostate: Secondary | ICD-10-CM | POA: Diagnosis not present

## 2018-04-22 DIAGNOSIS — Z7982 Long term (current) use of aspirin: Secondary | ICD-10-CM

## 2018-04-22 DIAGNOSIS — Z955 Presence of coronary angioplasty implant and graft: Secondary | ICD-10-CM

## 2018-04-22 DIAGNOSIS — Z880 Allergy status to penicillin: Secondary | ICD-10-CM

## 2018-04-22 DIAGNOSIS — Z91040 Latex allergy status: Secondary | ICD-10-CM | POA: Diagnosis not present

## 2018-04-22 DIAGNOSIS — E669 Obesity, unspecified: Secondary | ICD-10-CM | POA: Diagnosis present

## 2018-04-22 DIAGNOSIS — T8484XA Pain due to internal orthopedic prosthetic devices, implants and grafts, initial encounter: Secondary | ICD-10-CM | POA: Diagnosis present

## 2018-04-22 DIAGNOSIS — Z888 Allergy status to other drugs, medicaments and biological substances status: Secondary | ICD-10-CM | POA: Diagnosis not present

## 2018-04-22 DIAGNOSIS — Z9181 History of falling: Secondary | ICD-10-CM

## 2018-04-22 DIAGNOSIS — Y792 Prosthetic and other implants, materials and accessory orthopedic devices associated with adverse incidents: Secondary | ICD-10-CM | POA: Diagnosis not present

## 2018-04-22 DIAGNOSIS — Z8661 Personal history of infections of the central nervous system: Secondary | ICD-10-CM | POA: Diagnosis not present

## 2018-04-22 DIAGNOSIS — T84032A Mechanical loosening of internal right knee prosthetic joint, initial encounter: Secondary | ICD-10-CM | POA: Diagnosis present

## 2018-04-22 DIAGNOSIS — Z96659 Presence of unspecified artificial knee joint: Secondary | ICD-10-CM

## 2018-04-22 DIAGNOSIS — I2510 Atherosclerotic heart disease of native coronary artery without angina pectoris: Secondary | ICD-10-CM | POA: Diagnosis present

## 2018-04-22 DIAGNOSIS — I255 Ischemic cardiomyopathy: Secondary | ICD-10-CM | POA: Diagnosis present

## 2018-04-22 DIAGNOSIS — Z87892 Personal history of anaphylaxis: Secondary | ICD-10-CM | POA: Diagnosis not present

## 2018-04-22 DIAGNOSIS — I119 Hypertensive heart disease without heart failure: Secondary | ICD-10-CM | POA: Diagnosis present

## 2018-04-22 DIAGNOSIS — Z96651 Presence of right artificial knee joint: Secondary | ICD-10-CM

## 2018-04-22 DIAGNOSIS — M1711 Unilateral primary osteoarthritis, right knee: Secondary | ICD-10-CM | POA: Diagnosis present

## 2018-04-22 DIAGNOSIS — D62 Acute posthemorrhagic anemia: Secondary | ICD-10-CM | POA: Diagnosis not present

## 2018-04-22 DIAGNOSIS — E785 Hyperlipidemia, unspecified: Secondary | ICD-10-CM | POA: Diagnosis present

## 2018-04-22 DIAGNOSIS — Z96652 Presence of left artificial knee joint: Secondary | ICD-10-CM | POA: Diagnosis present

## 2018-04-22 DIAGNOSIS — Z8249 Family history of ischemic heart disease and other diseases of the circulatory system: Secondary | ICD-10-CM

## 2018-04-22 HISTORY — PX: TOTAL KNEE REVISION: SHX996

## 2018-04-22 LAB — GLUCOSE, CAPILLARY
GLUCOSE-CAPILLARY: 98 mg/dL (ref 65–99)
GLUCOSE-CAPILLARY: 138 mg/dL — AB (ref 65–99)
Glucose-Capillary: 185 mg/dL — ABNORMAL HIGH (ref 65–99)

## 2018-04-22 LAB — HEMOGLOBIN A1C
Hgb A1c MFr Bld: 5.7 % — ABNORMAL HIGH (ref 4.8–5.6)
Mean Plasma Glucose: 116.89 mg/dL

## 2018-04-22 SURGERY — TOTAL KNEE REVISION
Anesthesia: Spinal | Site: Knee | Laterality: Right

## 2018-04-22 MED ORDER — DEXAMETHASONE SODIUM PHOSPHATE 10 MG/ML IJ SOLN
INTRAMUSCULAR | Status: AC
  Filled 2018-04-22: qty 1

## 2018-04-22 MED ORDER — LIDOCAINE 2% (20 MG/ML) 5 ML SYRINGE
INTRAMUSCULAR | Status: AC
  Filled 2018-04-22: qty 5

## 2018-04-22 MED ORDER — CELECOXIB 200 MG PO CAPS
200.0000 mg | ORAL_CAPSULE | Freq: Two times a day (BID) | ORAL | Status: DC
  Administered 2018-04-22 – 2018-04-23 (×3): 200 mg via ORAL
  Filled 2018-04-22 (×3): qty 1

## 2018-04-22 MED ORDER — KCL IN DEXTROSE-NACL 20-5-0.45 MEQ/L-%-% IV SOLN
INTRAVENOUS | Status: DC
  Administered 2018-04-22: 12:00:00 via INTRAVENOUS
  Filled 2018-04-22 (×2): qty 1000

## 2018-04-22 MED ORDER — OXYCODONE HCL 5 MG PO TABS: 5.0000 mg | ORAL_TABLET | Freq: Once | ORAL | Status: DC | PRN

## 2018-04-22 MED ORDER — SODIUM CHLORIDE 0.9 % IJ SOLN
INTRAMUSCULAR | Status: DC | PRN
  Administered 2018-04-22 (×2): 50 mL

## 2018-04-22 MED ORDER — ONDANSETRON HCL 4 MG PO TABS: 4.0000 mg | ORAL_TABLET | Freq: Four times a day (QID) | ORAL | Status: DC | PRN

## 2018-04-22 MED ORDER — METHOCARBAMOL 1000 MG/10ML IJ SOLN
500.0000 mg | Freq: Four times a day (QID) | INTRAVENOUS | Status: DC | PRN
  Filled 2018-04-22: qty 5

## 2018-04-22 MED ORDER — PROMETHAZINE HCL 25 MG/ML IJ SOLN: 6.2500 mg | INTRAMUSCULAR | Status: DC | PRN

## 2018-04-22 MED ORDER — NITROGLYCERIN 0.4 MG SL SUBL: 0.4000 mg | SUBLINGUAL_TABLET | SUBLINGUAL | Status: DC | PRN

## 2018-04-22 MED ORDER — ASPIRIN EC 81 MG PO TBEC: 81.0000 mg | DELAYED_RELEASE_TABLET | Freq: Two times a day (BID) | ORAL | 0 refills | Status: DC

## 2018-04-22 MED ORDER — BUPIVACAINE IN DEXTROSE 0.75-8.25 % IT SOLN
INTRATHECAL | Status: DC | PRN
  Administered 2018-04-22: 2 mL via INTRATHECAL

## 2018-04-22 MED ORDER — BISACODYL 5 MG PO TBEC: 5.0000 mg | DELAYED_RELEASE_TABLET | Freq: Every day | ORAL | Status: DC | PRN

## 2018-04-22 MED ORDER — ONDANSETRON HCL 4 MG/2ML IJ SOLN
INTRAMUSCULAR | Status: DC | PRN
  Administered 2018-04-22: 4 mg via INTRAVENOUS

## 2018-04-22 MED ORDER — METOCLOPRAMIDE HCL 5 MG/ML IJ SOLN
5.0000 mg | Freq: Three times a day (TID) | INTRAMUSCULAR | Status: DC | PRN
Start: 2018-04-22 — End: 2018-04-23

## 2018-04-22 MED ORDER — OXYCODONE HCL 5 MG PO TABS
5.0000 mg | ORAL_TABLET | ORAL | Status: DC | PRN
  Administered 2018-04-22: 10 mg via ORAL
  Administered 2018-04-23 (×2): 5 mg via ORAL
  Administered 2018-04-23: 10 mg via ORAL
  Filled 2018-04-22: qty 1
  Filled 2018-04-22 (×2): qty 2
  Filled 2018-04-22 (×2): qty 1

## 2018-04-22 MED ORDER — OXYCODONE-ACETAMINOPHEN 5-325 MG PO TABS: 1.0000 | ORAL_TABLET | ORAL | 0 refills | Status: DC | PRN

## 2018-04-22 MED ORDER — HYDROMORPHONE HCL 1 MG/ML IJ SOLN: 0.5000 mg | INTRAMUSCULAR | Status: DC | PRN

## 2018-04-22 MED ORDER — METHOCARBAMOL 500 MG PO TABS: 500.0000 mg | ORAL_TABLET | Freq: Two times a day (BID) | ORAL | 0 refills | Status: DC

## 2018-04-22 MED ORDER — DIPHENHYDRAMINE HCL 12.5 MG/5ML PO ELIX: 12.5000 mg | ORAL_SOLUTION | ORAL | Status: DC | PRN

## 2018-04-22 MED ORDER — ACETAMINOPHEN 325 MG PO TABS
325.0000 mg | ORAL_TABLET | Freq: Four times a day (QID) | ORAL | Status: DC | PRN
  Filled 2018-04-22: qty 2

## 2018-04-22 MED ORDER — ASPIRIN 81 MG PO CHEW
81.0000 mg | CHEWABLE_TABLET | Freq: Two times a day (BID) | ORAL | Status: DC
  Administered 2018-04-22 – 2018-04-23 (×2): 81 mg via ORAL
  Filled 2018-04-22 (×2): qty 1

## 2018-04-22 MED ORDER — BUPIVACAINE LIPOSOME 1.3 % IJ SUSP
INTRAMUSCULAR | Status: DC | PRN
  Administered 2018-04-22 (×2): 20 mL

## 2018-04-22 MED ORDER — PHENOL 1.4 % MT LIQD: 1.0000 | OROMUCOSAL | Status: DC | PRN

## 2018-04-22 MED ORDER — PROPOFOL 10 MG/ML IV BOLUS
INTRAVENOUS | Status: AC
  Filled 2018-04-22: qty 40

## 2018-04-22 MED ORDER — ALUM & MAG HYDROXIDE-SIMETH 200-200-20 MG/5ML PO SUSP: 30.0000 mL | ORAL | Status: DC | PRN

## 2018-04-22 MED ORDER — MIDAZOLAM HCL 5 MG/5ML IJ SOLN
INTRAMUSCULAR | Status: DC | PRN
  Administered 2018-04-22: 2 mg via INTRAVENOUS

## 2018-04-22 MED ORDER — TICAGRELOR 90 MG PO TABS
90.0000 mg | ORAL_TABLET | Freq: Two times a day (BID) | ORAL | Status: DC
  Administered 2018-04-23: 90 mg via ORAL
  Filled 2018-04-22: qty 1

## 2018-04-22 MED ORDER — GABAPENTIN 300 MG PO CAPS
300.0000 mg | ORAL_CAPSULE | Freq: Three times a day (TID) | ORAL | Status: DC
  Administered 2018-04-22 – 2018-04-23 (×4): 300 mg via ORAL
  Filled 2018-04-22 (×4): qty 1

## 2018-04-22 MED ORDER — AMLODIPINE BESYLATE 10 MG PO TABS
10.0000 mg | ORAL_TABLET | Freq: Every day | ORAL | Status: DC
  Administered 2018-04-23: 10 mg via ORAL
  Filled 2018-04-22: qty 1

## 2018-04-22 MED ORDER — BUPIVACAINE-EPINEPHRINE (PF) 0.5% -1:200000 IJ SOLN
INTRAMUSCULAR | Status: AC
  Filled 2018-04-22: qty 30

## 2018-04-22 MED ORDER — STERILE WATER FOR IRRIGATION IR SOLN
Status: DC | PRN
  Administered 2018-04-22: 2000 mL

## 2018-04-22 MED ORDER — CHLORHEXIDINE GLUCONATE 4 % EX LIQD: 60.0000 mL | Freq: Once | CUTANEOUS | Status: DC

## 2018-04-22 MED ORDER — METOPROLOL SUCCINATE ER 25 MG PO TB24
25.0000 mg | ORAL_TABLET | Freq: Every day | ORAL | Status: DC
  Filled 2018-04-22: qty 1

## 2018-04-22 MED ORDER — FENTANYL CITRATE (PF) 100 MCG/2ML IJ SOLN
INTRAMUSCULAR | Status: AC
  Filled 2018-04-22: qty 2

## 2018-04-22 MED ORDER — BUPIVACAINE-EPINEPHRINE 0.5% -1:200000 IJ SOLN
INTRAMUSCULAR | Status: DC | PRN
  Administered 2018-04-22 (×2): 10 mL

## 2018-04-22 MED ORDER — FENTANYL CITRATE (PF) 100 MCG/2ML IJ SOLN
INTRAMUSCULAR | Status: DC | PRN
  Administered 2018-04-22 (×2): 50 ug via INTRAVENOUS

## 2018-04-22 MED ORDER — MIDAZOLAM HCL 2 MG/2ML IJ SOLN
INTRAMUSCULAR | Status: AC
  Filled 2018-04-22: qty 2

## 2018-04-22 MED ORDER — PROPOFOL 500 MG/50ML IV EMUL
INTRAVENOUS | Status: DC | PRN
  Administered 2018-04-22: 50 ug/kg/min via INTRAVENOUS

## 2018-04-22 MED ORDER — PROPOFOL 10 MG/ML IV BOLUS
INTRAVENOUS | Status: AC
  Filled 2018-04-22: qty 60

## 2018-04-22 MED ORDER — OXYCODONE HCL 5 MG/5ML PO SOLN: 5.0000 mg | Freq: Once | ORAL | Status: DC | PRN

## 2018-04-22 MED ORDER — HYDROMORPHONE HCL 1 MG/ML IJ SOLN: 0.2500 mg | INTRAMUSCULAR | Status: DC | PRN

## 2018-04-22 MED ORDER — INSULIN ASPART 100 UNIT/ML ~~LOC~~ SOLN
0.0000 [IU] | Freq: Three times a day (TID) | SUBCUTANEOUS | Status: DC
  Administered 2018-04-22: 2 [IU] via SUBCUTANEOUS
  Administered 2018-04-23: 3 [IU] via SUBCUTANEOUS

## 2018-04-22 MED ORDER — LISINOPRIL 20 MG PO TABS
40.0000 mg | ORAL_TABLET | Freq: Every day | ORAL | Status: DC
  Administered 2018-04-23: 40 mg via ORAL
  Filled 2018-04-22: qty 2

## 2018-04-22 MED ORDER — 0.9 % SODIUM CHLORIDE (POUR BTL) OPTIME
TOPICAL | Status: DC | PRN
  Administered 2018-04-22: 1000 mL

## 2018-04-22 MED ORDER — TRANEXAMIC ACID 1000 MG/10ML IV SOLN
INTRAVENOUS | Status: DC | PRN
  Administered 2018-04-22: 2000 mg via TOPICAL

## 2018-04-22 MED ORDER — POLYETHYLENE GLYCOL 3350 17 G PO PACK
17.0000 g | PACK | Freq: Every day | ORAL | Status: DC | PRN
  Administered 2018-04-23: 17 g via ORAL
  Filled 2018-04-22: qty 1

## 2018-04-22 MED ORDER — FLEET ENEMA 7-19 GM/118ML RE ENEM: 1.0000 | ENEMA | Freq: Once | RECTAL | Status: DC | PRN

## 2018-04-22 MED ORDER — MENTHOL 3 MG MT LOZG: 1.0000 | LOZENGE | OROMUCOSAL | Status: DC | PRN

## 2018-04-22 MED ORDER — ROPIVACAINE HCL 5 MG/ML IJ SOLN
INTRAMUSCULAR | Status: DC | PRN
  Administered 2018-04-22: 20 mL via PERINEURAL

## 2018-04-22 MED ORDER — EPHEDRINE 5 MG/ML INJ
INTRAVENOUS | Status: AC
  Filled 2018-04-22: qty 10

## 2018-04-22 MED ORDER — SODIUM CHLORIDE 0.9 % IJ SOLN
INTRAMUSCULAR | Status: AC
  Filled 2018-04-22: qty 50

## 2018-04-22 MED ORDER — LACTATED RINGERS IV SOLN
INTRAVENOUS | Status: DC
  Administered 2018-04-22 (×3): via INTRAVENOUS

## 2018-04-22 MED ORDER — DOCUSATE SODIUM 100 MG PO CAPS
100.0000 mg | ORAL_CAPSULE | Freq: Two times a day (BID) | ORAL | Status: DC
  Administered 2018-04-22 – 2018-04-23 (×2): 100 mg via ORAL
  Filled 2018-04-22 (×2): qty 1

## 2018-04-22 MED ORDER — ONDANSETRON HCL 4 MG/2ML IJ SOLN
INTRAMUSCULAR | Status: AC
  Filled 2018-04-22: qty 2

## 2018-04-22 MED ORDER — SODIUM CHLORIDE 0.9 % IV SOLN
1000.0000 mg | Freq: Once | INTRAVENOUS | Status: AC
  Administered 2018-04-22: 1000 mg via INTRAVENOUS
  Filled 2018-04-22: qty 1100

## 2018-04-22 MED ORDER — METOCLOPRAMIDE HCL 5 MG PO TABS: 5.0000 mg | ORAL_TABLET | Freq: Three times a day (TID) | ORAL | Status: DC | PRN

## 2018-04-22 MED ORDER — METHOCARBAMOL 500 MG PO TABS
500.0000 mg | ORAL_TABLET | Freq: Four times a day (QID) | ORAL | Status: DC | PRN
  Administered 2018-04-22: 500 mg via ORAL
  Filled 2018-04-22: qty 1

## 2018-04-22 MED ORDER — ONDANSETRON HCL 4 MG/2ML IJ SOLN
4.0000 mg | Freq: Four times a day (QID) | INTRAMUSCULAR | Status: DC | PRN
Start: 2018-04-22 — End: 2018-04-23

## 2018-04-22 MED ORDER — PANTOPRAZOLE SODIUM 40 MG PO TBEC
40.0000 mg | DELAYED_RELEASE_TABLET | Freq: Every day | ORAL | Status: DC
  Administered 2018-04-22 – 2018-04-23 (×2): 40 mg via ORAL
  Filled 2018-04-22 (×2): qty 1

## 2018-04-22 MED ORDER — DEXAMETHASONE SODIUM PHOSPHATE 10 MG/ML IJ SOLN
INTRAMUSCULAR | Status: DC | PRN
  Administered 2018-04-22: 10 mg via INTRAVENOUS

## 2018-04-22 MED ORDER — SODIUM CHLORIDE 0.9 % IR SOLN
Status: DC | PRN
  Administered 2018-04-22: 3000 mL

## 2018-04-22 SURGICAL SUPPLY — 62 items
ADAPTER BOLT FEMORAL +2/-2 (Knees) ×2 IMPLANT
BAG DECANTER FOR FLEXI CONT (MISCELLANEOUS) ×2 IMPLANT
BAG ZIPLOCK 12X15 (MISCELLANEOUS) ×2 IMPLANT
BANDAGE ACE 6X5 VEL STRL LF (GAUZE/BANDAGES/DRESSINGS) ×2 IMPLANT
BANDAGE ELASTIC 6 VELCRO ST LF (GAUZE/BANDAGES/DRESSINGS) ×2 IMPLANT
BNDG ELASTIC 6X10 VLCR STRL LF (GAUZE/BANDAGES/DRESSINGS) ×2 IMPLANT
BOWL SMART MIX CTS (DISPOSABLE) IMPLANT
BRUSH FEMORAL CANAL (MISCELLANEOUS) ×2 IMPLANT
CEMENT HV SMART SET (Cement) ×6 IMPLANT
CLOSURE STERI-STRIP 1/4X4 (GAUZE/BANDAGES/DRESSINGS) ×2 IMPLANT
COVER SURGICAL LIGHT HANDLE (MISCELLANEOUS) ×2 IMPLANT
CUFF TOURN SGL QUICK 34 (TOURNIQUET CUFF) ×1
CUFF TRNQT CYL 34X4X40X1 (TOURNIQUET CUFF) ×1 IMPLANT
DECANTER SPIKE VIAL GLASS SM (MISCELLANEOUS) IMPLANT
DRAPE ORTHO SPLIT 77X108 STRL (DRAPES) ×1
DRAPE SURG ORHT 6 SPLT 77X108 (DRAPES) ×1 IMPLANT
DRESSING AQUACEL AG SP 3.5X10 (GAUZE/BANDAGES/DRESSINGS) IMPLANT
DRSG ADAPTIC 3X8 NADH LF (GAUZE/BANDAGES/DRESSINGS) ×2 IMPLANT
DRSG AQUACEL AG ADV 3.5X14 (GAUZE/BANDAGES/DRESSINGS) ×2 IMPLANT
DRSG AQUACEL AG SP 3.5X10 (GAUZE/BANDAGES/DRESSINGS)
DURAPREP 26ML APPLICATOR (WOUND CARE) ×2 IMPLANT
ELECT REM PT RETURN 15FT ADLT (MISCELLANEOUS) ×2 IMPLANT
FEM TC3 PFC SIGMA SZ5 (Orthopedic Implant) ×2 IMPLANT
FEMORAL ADAPTER (Orthopedic Implant) ×2 IMPLANT
FEMORAL TC3 PFC SIGMA SZ5 (Orthopedic Implant) ×1 IMPLANT
GAUZE SPONGE 4X4 12PLY STRL (GAUZE/BANDAGES/DRESSINGS) ×2 IMPLANT
GLOVE BIO SURGEON STRL SZ7.5 (GLOVE) IMPLANT
GLOVE BIO SURGEON STRL SZ8.5 (GLOVE) IMPLANT
GLOVE BIOGEL PI IND STRL 8 (GLOVE) ×1 IMPLANT
GLOVE BIOGEL PI IND STRL 9 (GLOVE) ×1 IMPLANT
GLOVE BIOGEL PI INDICATOR 8 (GLOVE) ×1
GLOVE BIOGEL PI INDICATOR 9 (GLOVE) ×1
GLOVE SURG SS PI 7.5 STRL IVOR (GLOVE) ×2 IMPLANT
GLOVE SURG SS PI 8.5 STRL IVOR (GLOVE) ×2
GLOVE SURG SS PI 8.5 STRL STRW (GLOVE) ×2 IMPLANT
HOOD PEEL AWAY FLYTE STAYCOOL (MISCELLANEOUS) ×6 IMPLANT
IMMOBILIZER KNEE 20 (SOFTGOODS) ×4 IMPLANT
IMMOBILIZER KNEE 20 THIGH 36 (SOFTGOODS) ×1 IMPLANT
INSERT TIBIAL TC3 RP SZ5 12.5 (Knees) ×2 IMPLANT
NEEDLE HYPO 22GX1.5 SAFETY (NEEDLE) ×2 IMPLANT
NS IRRIG 1000ML POUR BTL (IV SOLUTION) ×2 IMPLANT
PACK TOTAL KNEE CUSTOM (KITS) ×2 IMPLANT
PAD ABD 7.5X8 STRL (GAUZE/BANDAGES/DRESSINGS) ×2 IMPLANT
PATELLA DOME PFC 41MM (Knees) ×2 IMPLANT
POSITIONER SURGICAL ARM (MISCELLANEOUS) ×2 IMPLANT
SPONGE LAP 18X18 RF (DISPOSABLE) ×4 IMPLANT
STAPLER VISISTAT 35W (STAPLE) IMPLANT
STEM FEMORAL 20X150 UNI TITAN (Joint) ×2 IMPLANT
STEM UNIVERSAL FLUTED 150X14MM (Shell) ×2 IMPLANT
SUT VIC AB 1 CTX 36 (SUTURE) ×1
SUT VIC AB 1 CTX36XBRD ANBCTR (SUTURE) ×1 IMPLANT
SUT VIC AB 2-0 CT1 27 (SUTURE) ×1
SUT VIC AB 2-0 CT1 TAPERPNT 27 (SUTURE) ×1 IMPLANT
SUT VIC AB 3-0 CT1 27 (SUTURE) ×1
SUT VIC AB 3-0 CT1 TAPERPNT 27 (SUTURE) ×1 IMPLANT
SWAB COLLECTION DEVICE MRSA (MISCELLANEOUS) ×2 IMPLANT
SWAB CULTURE ESWAB REG 1ML (MISCELLANEOUS) ×2 IMPLANT
SYR CONTROL 10ML LL (SYRINGE) ×4 IMPLANT
TRAY FOLEY CATH 16FRSI W/METER (SET/KITS/TRAYS/PACK) IMPLANT
TRAY REVISION SZ 6 (Knees) ×2 IMPLANT
WATER STERILE IRR 1000ML POUR (IV SOLUTION) ×4 IMPLANT
WRAP KNEE MAXI GEL POST OP (GAUZE/BANDAGES/DRESSINGS) ×2 IMPLANT

## 2018-04-22 NOTE — Evaluation (Signed)
Physical Therapy Evaluation Patient Details Name: Jacob Quinn MRN: [id number] DOB: 1956-04-19 Today's Date: 04/22/2018   History of Present Illness  Jacob Quinn  has presented 04/22/18 with the diagnosis of LOOSE RIGHT UNI TOTAL KNEE ARTHROPLASTY  , S/P R TKA. H/O  LTKA with revisions 1/19  Clinical Impression  *The patient ambuated x 100' this visit . Plans to Dc home. Pt admitted with above diagnosis. Pt currently with functional limitations due to the deficits listed below (see PT Problem List).  Pt will benefit from skilled PT to increase their independence and safety with mobility to allow discharge to the venue listed below.     Follow Up Recommendations Follow surgeon's recommendation for DC plan and follow-up therapies    Equipment Recommendations  None recommended by PT    Recommendations for Other Services       Precautions / Restrictions Precautions Precautions: Fall;Knee Precaution Comments: Zero knee when in bed, not walking Required Braces or Orthoses: Knee Immobilizer - Right      Mobility  Bed Mobility Overal bed mobility: Needs Assistance Bed Mobility: Supine to Sit;Sit to Supine     Supine to sit: Supervision;HOB elevated Sit to supine: Supervision;HOB elevated      Transfers Overall transfer level: Needs assistance Equipment used: Rolling walker (2 wheeled) Transfers: Sit to/from Stand Sit to Stand: Min assist         General transfer comment: cues for safety and hand placement  Ambulation/Gait Ambulation/Gait assistance: Min assist Gait Distance (Feet): 100 Feet Assistive device: Rolling walker (2 wheeled) Gait Pattern/deviations: Step-through pattern     General Gait Details: cues for safety, slow down aspeed. Did not wear KI  Stairs            Wheelchair Mobility    Modified Rankin (Stroke Patients Only)       Balance                                             Pertinent Vitals/Pain Pain  Assessment: 0-10 Pain Score: 7  Pain Location: right knee Pain Descriptors / Indicators: Aching;Discomfort;Grimacing Pain Intervention(s): Repositioned;Premedicated before session;Monitored during session;Patient requesting pain meds-RN notified;Ice applied    Home Living Family/patient expects to be discharged to:: Private residence Living Arrangements: Children Available Help at Discharge: Available 24 hours/day Type of Home: House Home Access: Stairs to enter Entrance Stairs-Rails: Can reach both Entrance Stairs-Number of Steps: 5 Home Layout: One level Home Equipment: Walker - 2 wheels;Bedside commode      Prior Function Level of Independence: Independent with assistive device(s)         Comments: works part time     Journalist, newspaper   Dominant Hand: Right    Extremity/Trunk Assessment   Upper Extremity Assessment Upper Extremity Assessment: Overall WFL for tasks assessed    Lower Extremity Assessment Lower Extremity Assessment: LLE deficits/detail;RLE deficits/detail RLE Deficits / Details: + SLR LLE Deficits / Details: knee flexion 100*       Communication   Communication: No difficulties  Cognition Arousal/Alertness: Awake/alert Behavior During Therapy: WFL for tasks assessed/performed Overall Cognitive Status: Within Functional Limits for tasks assessed                                        General Comments  Exercises     Assessment/Plan    PT Assessment Patient needs continued PT services  PT Problem List Decreased strength;Decreased range of motion;Decreased knowledge of use of DME;Decreased activity tolerance;Decreased safety awareness;Decreased knowledge of precautions;Decreased mobility;Pain       PT Treatment Interventions DME instruction;Gait training;Stair training;Functional mobility training;Patient/family education;Therapeutic activities;Therapeutic exercise    PT Goals (Current goals can be found in the Care  Plan section)  Acute Rehab PT Goals Patient Stated Goal: go home PT Goal Formulation: With patient Time For Goal Achievement: 04/26/18 Potential to Achieve Goals: Good    Frequency 7X/week   Barriers to discharge        Co-evaluation               AM-PAC PT "6 Clicks" Daily Activity  Outcome Measure Difficulty turning over in bed (including adjusting bedclothes, sheets and blankets)?: A Little Difficulty moving from lying on back to sitting on the side of the bed? : A Little Difficulty sitting down on and standing up from a chair with arms (e.g., wheelchair, bedside commode, etc,.)?: A Little Help needed moving to and from a bed to chair (including a wheelchair)?: A Little Help needed walking in hospital room?: A Little Help needed climbing 3-5 steps with a railing? : A Lot 6 Click Score: 17    End of Session Equipment Utilized During Treatment: Gait belt Activity Tolerance: Patient tolerated treatment well Patient left: in bed;with call bell/phone within reach;with family/visitor present Nurse Communication: Mobility status PT Visit Diagnosis: Unsteadiness on feet (R26.81);Pain Pain - Right/Left: Right Pain - part of body: Knee    Time: 9507-2257 PT Time Calculation (min) (ACUTE ONLY): 19 min   Charges:   PT Evaluation $PT Eval Low Complexity: 1 Low     PT G CodesTresa Endo PT [phone]  Claretha Cooper 04/22/2018, 4:59 PM

## 2018-04-22 NOTE — Anesthesia Preprocedure Evaluation (Signed)
Anesthesia Evaluation  Patient identified by MRN, date of birth, ID band Patient awake    Reviewed: Allergy & Precautions, H&P , Patient's Chart, lab work & pertinent test results, reviewed documented beta blocker date and time   Airway Mallampati: II  TM Distance: >3 FB Neck ROM: full    Dental no notable dental hx.    Pulmonary    Pulmonary exam normal breath sounds clear to auscultation       Cardiovascular Exercise Tolerance: Good hypertension, Pt. on medications and Pt. on home beta blockers + CAD and + Past MI   Rhythm:regular Rate:Normal     Neuro/Psych    GI/Hepatic   Endo/Other    Renal/GU      Musculoskeletal  (+) Arthritis , Osteoarthritis,    Abdominal   Peds  Hematology   Anesthesia Other Findings CAD: The left ventricular ejection fraction is mildly decreased (45-54%).  Nuclear stress EF: 46%.  There was no ST segment deviation noted during stress.  This is a low risk study. HTN MO     Reproductive/Obstetrics                             Anesthesia Physical  Anesthesia Plan  ASA: III  Anesthesia Plan: Spinal   Post-op Pain Management:  Regional for Post-op pain   Induction: Intravenous  PONV Risk Score and Plan: 1 and Ondansetron  Airway Management Planned: Simple Face Mask  Additional Equipment:   Intra-op Plan:   Post-operative Plan:   Informed Consent: I have reviewed the patients History and Physical, chart, labs and discussed the procedure including the risks, benefits and alternatives for the proposed anesthesia with the patient or authorized representative who has indicated his/her understanding and acceptance.   Dental advisory given  Plan Discussed with:   Anesthesia Plan Comments: (  )        Anesthesia Quick Evaluation

## 2018-04-22 NOTE — Op Note (Signed)
PATIENT ID:      Jacob Quinn  MRN:     [id number] DOB/AGE:    31-May-1956 / 62 y.o.       OPERATIVE REPORT    DATE OF PROCEDURE:  04/22/2018       PREOPERATIVE DIAGNOSIS:   LOOSE RIGHT UNI TOTAL KNEE ARTHROPLASTY      Estimated body mass index is 32.59 kg/m as calculated from the following:   Height as of this encounter: 6\' 1"  (1.854 m).   Weight as of this encounter: 247 lb (112 kg).                                                        POSTOPERATIVE DIAGNOSIS:   LOOSE RIGHT UNI TOTAL KNEE ARTHROPLASTY                                                                      PROCEDURE:  Procedure(s): RIGHT TOTAL KNEE REVISION Using Depuy implants #5 right TC 3 femur, 20 mm x 150 mm femoral stem, #6 MBT tibia, 14 mm x 150 mm tibial stem, 12.5 mm TC 3 RP bearing, 41 patella     SURGEON: Kerin Salen    ASSISTANT:   Kerry Hough. Barton Dubois   (Present and scrubbed throughout the case, critical for assistance with exposure, retraction, instrumentation, and closure.)         ANESTHESIA: Spinal, Exparel  EBL: 400 cc  FLUID REPLACEMENT: 2000 cc crystalloid TOURNIQUET TIME: 0 min  DRAINS: none  Tranexamic Acid: 1 g IV, 2 g topical  COMPLICATIONS:  None   Specimens: Synovial fluid for Gram stain and culture fluid was clear    INDICATIONS FOR PROCEDURE: The patient has  LOOSE RIGHT UNI TOTAL KNEE ARTHROPLASTY, index procedure was 10 years ago by another physician.  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  On the contralateral side he had a uni-that came loose followed by a revision that came loose followed by a longstem hinged that I placed 3 or 4 months ago he has done well.  Because of his propensity to come loose we are going to go ahead and revise to a TC 3 femur MBT tray with long stems.  The risks and benefits of surgery been discussed and questions answered.  Preoperative work-up included aspiration that showed no growth inflammatory markers  were negative.  DESCRIPTION OF PROCEDURE: The patient identified by armband, received  IV antibiotics, in the holding area at [facility] Patient taken to the operating room, appropriate anesthetic monitors were attached, and general endotracheal anesthesia induced with the patient in supine position. Tourniquet applied high to the operative thigh. Lateral post and foot positioner applied to the table, the lower extremity was then prepped and draped in usual sterile fashion from the ankle to the tourniquet. Time-out procedure was performed.  We began the operation with the knee flexed 100 degrees, by making an anterior midline incision starting at handbreadth above the patella going over the patella 1 cm medial to and 4 cm distal to the  tibial tubercle. Small bleeders in the skin and the subcutaneous tissue identified and cauterized. Transverse retinaculum was incised and reflected medially and a medial parapatellar arthrotomy was accomplished.  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  We performed a fairly extensive synovectomy that helped [place] to mobilize the patella which was subluxed laterally we then proceeded to elevate the superficial medial collateral ligament from anterior to posterior exposing the tibial implant which was closed grossly loose and subsided.  This is actually easily removed with a mother-in-law.  The femoral component was also grossly loose and removed with 1 small strike on 1/4 inch osteotome with a mallet.  We continue to remove scar tissue from the notch region and from the infrapatellar region allowing [place] to gradually evert the patella and hyperflexed the knee.  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  With the patella everted and the proximal tibia exposed we then entered the proximal tibia with a step  drill followed by intramedullary reaming up to a 14 mm reamer for the appropriate depth for 150 stem on MBT tray.  A collar was placed on the reamer and the proximal tibial cut was accomplished 3 degrees posterior sloped.  We removed a thin wafer of bone medially and about 10/11 millimeters laterally.  We then directed our attention to the distal femur which was under 2 mm anterior to the PCL origin with a step drill followed by intramedullary reaming up to a 20 mm reamer appropriate depth for a 150 stem on a TC 3 femur.  Collar was placed on the reamer distal femoral cut was accomplished with a distal femoral cutting guide based off of the reamer.  We then sized for a 5 right TC 3 femoral component with the stem 2 mm anterior.  Chamfer cutting guide was then placed on the IM rod and pinned into place in the appropriate rotation anterior posterior and chamfer cuts were accomplished without difficulty and the chamfer guide was removed the box cutting guide was also placed over the reamer.  Pinned into place and the box cut for TC 3 femur accomplished.  Again we noted some very large cyst big enough to put your finger and on the medial femoral condyle.  We then brought the knee into hyperflexion remove posterior osteophytes and injected the posterior tissues with Exparel Marcaine mixture at this time.  The knee was brought into extension and the extension gap was appropriate for 12.5 mm bearing.  The everted patella was then grasped with a cutting guide set at 15 mm in the posterior 12 mm of the patella resected size for a 41 patellar component and drilled.  At this point the knee was once again hyperflexed we sized for a 6 tibial tray which was pinned into place followed by the conical reamer and then the final reamer with a 14 mm stem attached.  We then prepared a trial with a 6 cut the delta fin keel hammered in place the femoral trial and reduce the knee noted it would come into full extension and full flexion of  130 degrees of 41 patella tracked without difficulty.  At this point all trial components removed all bony surfaces were WaterPik cleaned and dried with sponges.  There was minimal bleeding.  At the back table we assembled the final implants 5 right femur with  a 20 mm x 150 stem 6 MBT tibia with a 14 mm x 150 stem.  A triple batch of [place] HV cement was then mixed applied all bony metallic mating surfaces distal and the femur proximal and the tibia we then hammered into place the tibial component with a long stem in the femoral component with a long stem.  A 12.5 TC 3 RP bearing was placed the 41 mm patellar button was squeezed into place and excess cement removed the knee was placed in 30 degrees flexion with compression of the cement Exparel was injected into the rest of the soft tissues using the 100 stick technique.  The wound was irrigated out with normal saline pulse lavage.  We then closed with running #1 Vicryl suture in the parapatellar tendon incisio subcu tissues closed with 2-0 and 3-0 Vicryl suture in the subcuticular tissues.  A dressing of Aquacil 6 inch Ace wrap was applied patient was then transferred to the gurney and taken to the recovery room without difficulty.         bleeders were identified and cauterized.The parapatellar arthrotomy was closed with running #1 Vicryl suture. The subcutaneous tissue with 0 and 2-0 undyed Vicryl suture, and the skin with3-0 vicryl.  A dressing of Xeroform, 4 x 4, dressing sponges, Webril, and Ace wrap applied. The patient awakened, extubated, and taken to recovery room without difficulty.   Kerin Salen 04/22/2018, 10:10 AM

## 2018-04-22 NOTE — Anesthesia Procedure Notes (Signed)
Anesthesia Regional Block: Adductor canal block   Pre-Anesthetic Checklist: ,, timeout performed, Correct Patient, Correct Site, Correct Laterality, Correct Procedure, Correct Position, site marked, Risks and benefits discussed,  Surgical consent,  Pre-op evaluation,  At surgeon's request and post-op pain management  Laterality: Right  Prep: chloraprep       Needles:  Injection technique: Single-shot  Needle Type: Stimiplex     Needle Length: 9cm  Needle Gauge: 21     Additional Needles:   Procedures:,,,, ultrasound used (permanent image in chart),,,,  Narrative:  Start time: 04/22/2018 6:58 AM End time: 04/22/2018 7:03 AM Injection made incrementally with aspirations every 5 mL.  Performed by: Personally  Anesthesiologist: [name], MD

## 2018-04-22 NOTE — Transfer of Care (Signed)
Immediate Anesthesia Transfer of Care Note  Patient: Jacob Quinn  Procedure(s) Performed: RIGHT TOTAL KNEE REVISION (Right Knee)  Patient Location: PACU  Anesthesia Type:Spinal  Level of Consciousness: awake, alert  and oriented  Airway & Oxygen Therapy: Patient Spontanous Breathing and Patient connected to face mask oxygen  Post-op Assessment: Report given to RN and Post -op Vital signs reviewed and stable  Post vital signs: Reviewed and stable  Last Vitals:  Vitals Value Taken Time  BP    Temp    Pulse 58 04/22/2018 10:29 AM  Resp    SpO2 100 % 04/22/2018 10:29 AM  Vitals shown include unvalidated device data.  Last Pain:  Vitals:   04/22/18 0551  TempSrc: Oral  PainSc: 6       Patients Stated Pain Goal: 5 (61/51/83 4373)  Complications: No apparent anesthesia complications

## 2018-04-22 NOTE — Anesthesia Postprocedure Evaluation (Signed)
Anesthesia Post Note  Patient: Jacob Quinn  Procedure(s) Performed: RIGHT TOTAL KNEE REVISION (Right Knee)     Patient location during evaluation: PACU Anesthesia Type: Spinal Level of consciousness: oriented and awake and alert Pain management: pain level controlled Vital Signs Assessment: post-procedure vital signs reviewed and stable Respiratory status: spontaneous breathing and respiratory function stable Cardiovascular status: blood pressure returned to baseline and stable Postop Assessment: no headache, no backache and no apparent nausea or vomiting Anesthetic complications: no    Last Vitals:  Vitals:   04/22/18 1100 04/22/18 1115  BP: 111/81 103/67  Pulse: (!) 52 (!) 48  Resp: (!) 9 10  Temp:  (!) 36.4 C  SpO2: 98% 100%    Last Pain:  Vitals:   04/22/18 1115  TempSrc:   PainSc: 0-No pain                 [name]

## 2018-04-22 NOTE — Plan of Care (Signed)
Plan of care discussed with patient and family 

## 2018-04-22 NOTE — Anesthesia Procedure Notes (Signed)
Date/Time: 04/22/2018 7:27 AM Performed by: Talbot Grumbling, CRNA Oxygen Delivery Method: Simple face mask

## 2018-04-22 NOTE — Anesthesia Procedure Notes (Signed)
Spinal  Patient location during procedure: OR Start time: 04/22/2018 7:21 AM End time: 04/22/2018 7:26 AM Staffing Anesthesiologist: [name], MD Performed: anesthesiologist  Preanesthetic Checklist Completed: patient identified, site marked, surgical consent, pre-op evaluation, timeout performed, IV checked, risks and benefits discussed and monitors and equipment checked Spinal Block Patient position: sitting Prep: Betadine Patient monitoring: heart rate, cardiac monitor, continuous pulse ox and blood pressure Approach: midline Location: L3-4 Injection technique: single-shot Needle Needle type: Tuohy  Needle gauge: 22 G Needle length: 9 cm

## 2018-04-22 NOTE — Discharge Instructions (Signed)

## 2018-04-22 NOTE — Interval H&P Note (Signed)
History and Physical Interval Note:  04/22/2018 7:07 AM  Jacob Quinn  has presented today for surgery, with the diagnosis of LOOSE RIGHT UNI TOTAL KNEE ARTHROPLASTY  The various methods of treatment have been discussed with the patient and family. After consideration of risks, benefits and other options for treatment, the patient has consented to  Procedure(s): RIGHT TOTAL KNEE REVISION (Right) as a surgical intervention .  The patient's history has been reviewed, patient examined, no change in status, stable for surgery.  I have reviewed the patient's chart and labs.  Questions were answered to the patient's satisfaction.     Kerin Salen

## 2018-04-23 LAB — CBC
HCT: 32.5 % — ABNORMAL LOW (ref 39.0–52.0)
HEMOGLOBIN: 10.5 g/dL — AB (ref 13.0–17.0)
MCH: 27.8 pg (ref 26.0–34.0)
MCHC: 32.3 g/dL (ref 30.0–36.0)
MCV: 86 fL (ref 78.0–100.0)
Platelets: 254 10*3/uL (ref 150–400)
RBC: 3.78 MIL/uL — ABNORMAL LOW (ref 4.22–5.81)
RDW: 13.4 % (ref 11.5–15.5)
WBC: 16.8 10*3/uL — ABNORMAL HIGH (ref 4.0–10.5)

## 2018-04-23 LAB — BASIC METABOLIC PANEL
ANION GAP: 3 — AB (ref 5–15)
BUN: 16 mg/dL (ref 8–23)
CHLORIDE: 105 mmol/L (ref 98–111)
CO2: 29 mmol/L (ref 22–32)
CREATININE: 1 mg/dL (ref 0.61–1.24)
Calcium: 8.5 mg/dL — ABNORMAL LOW (ref 8.9–10.3)
GFR calc non Af Amer: >60 mL/min (ref >60)
GLUCOSE: 148 mg/dL — AB (ref 70–99)
Potassium: 4.8 mmol/L (ref 3.5–5.1)
Sodium: 137 mmol/L (ref 135–145)

## 2018-04-23 LAB — GLUCOSE, CAPILLARY
GLUCOSE-CAPILLARY: 115 mg/dL — AB (ref 70–99)
Glucose-Capillary: 152 mg/dL — ABNORMAL HIGH (ref 70–99)

## 2018-04-23 NOTE — Progress Notes (Addendum)
Physical Therapy Treatment Patient Details Name: Jacob Quinn MRN: [id number] DOB: 1956/10/29 Today's Date: 04/23/2018    History of Present Illness                         R knee total revision    PT Comments    POD #1 assisted patient with supine exercises, ambulation, and stairs. Patient able to correctly perform exercises on HEP handout and ambulate 200 ft. Patient was able to do 2 steps up/dwn x 2 without VC's. Patient is safe for D/C  Follow Up Recommendations  Follow surgeon's recommendation for DC plan and follow-up therapies;Home health PT     Equipment Recommendations  None recommended by PT    Recommendations for Other Services       Precautions / Restrictions Precautions Precautions: Fall;Knee Restrictions Weight Bearing Restrictions: No    Mobility  Bed Mobility Overal bed mobility: Modified Independent Bed Mobility: Sit to Supine           General bed mobility comments: No VC's needed  Transfers Overall transfer level: Modified independent Equipment used: Rolling walker (2 wheeled) Transfers: Sit to/from Stand Sit to Stand: Modified independent (Device/Increase time)            Ambulation/Gait Ambulation/Gait assistance: Modified independent (Device/Increase time) Gait Distance (Feet): 200 Feet Assistive device: Rolling walker (2 wheeled) Gait Pattern/deviations: Step-through pattern;Decreased step length - left;Decreased stance time - left;Decreased stride length;Antalgic         Stairs Stairs: Yes Stairs assistance: Modified independent (Device/Increase time) Stair Management: Two rails;Forwards;Step to pattern Number of Stairs: 2 General stair comments: Performed twice; no VC's needed   Wheelchair Mobility    Modified Rankin (Stroke Patients Only)       Balance                                            Cognition Arousal/Alertness: Awake/alert Behavior During Therapy: WFL for tasks  assessed/performed Overall Cognitive Status: Within Functional Limits for tasks assessed                                        Exercises Total Joint Exercises Ankle Circles/Pumps: AROM;Both;10 reps;Seated Quad Sets: AROM;10 reps;Right;Seated Towel Squeeze: AROM;Both;10 reps;Seated Short Arc Quad: Right;10 reps;Seated Heel Slides: AAROM;Right;Seated;10 reps Hip ABduction/ADduction: AROM;Right;Seated;10 reps Straight Leg Raises: AROM;Right;Seated;10 reps    General Comments        Pertinent Vitals/Pain Pain Assessment: 0-10 Pain Score: 3  Pain Location: R knee; 6/10 after gait Pain Descriptors / Indicators: Aching;Grimacing;Sore Pain Intervention(s): Limited activity within patient's tolerance;Repositioned;Ice applied;Monitored during session    Home Living                      Prior Function            PT Goals (current goals can now be found in the care plan section) Progress towards PT goals: Progressing toward goals    Frequency    7X/week      PT Plan Current plan remains appropriate    Co-evaluation              AM-PAC PT "6 Clicks" Daily Activity  Outcome Measure  Difficulty turning over in bed (including adjusting bedclothes, sheets and blankets)?:  A Little Difficulty moving from lying on back to sitting on the side of the bed? : A Little Difficulty sitting down on and standing up from a chair with arms (e.g., wheelchair, bedside commode, etc,.)?: A Little Help needed moving to and from a bed to chair (including a wheelchair)?: A Little Help needed walking in hospital room?: A Little Help needed climbing 3-5 steps with a railing? : A Little 6 Click Score: 18    End of Session Equipment Utilized During Treatment: Gait belt Activity Tolerance: Patient tolerated treatment well Patient left: in bed;with family/visitor present;with call bell/phone within reach Nurse Communication: Other (comment)(Pt. ready for D/C after  one session; Pt. sucessfully performed stairs) PT Visit Diagnosis: Unsteadiness on feet (R26.81);Pain Pain - Right/Left: Right Pain - part of body: Knee     Time: 6226-3335 PT Time Calculation (min) (ACUTE ONLY): 28 min  Charges:  $Gait Training: 8-22 mins $Therapeutic Exercise: 8-22 mins                    G Codes:       Rachel Bo, SPTA 04/23/2018, 11:43 AM   Direct Supervision was given during session.  Agree with above note Rica Koyanagi  PTA [facility]  Acute  Rehab Pager      [phone]

## 2018-04-23 NOTE — Discharge Summary (Signed)
Patient ID: BARAN KUHRT MRN: [id number] DOB/AGE: 64-Oct-1957 64 y.o.  Admit date: 04/22/2018 Discharge date: 04/23/2018  Admission Diagnoses:  Principal Problem:   Pain due to unicompartmental arthroplasty of knee [facility]) Active Problems:   Primary osteoarthritis of right knee   Discharge Diagnoses:  Same  Past Medical History:  Diagnosis Date  . 1st degree AV block   . BPH associated with nocturia   . CAD in native artery    a. NSTEMI 07/2016 -  LHC 08/07/16 showing 70% D1, 20% mLAD, segmental 95% then 75% OM stenosis, 85% acute marg, normal LVEDP - received PTCA/DES to OM.  Marland Kitchen Encephalitis, viral   . Hyperlipidemia 08/07/2016  . Hypertension   . Ischemic cardiomyopathy    a. LHC 07/2016 - mild mid anterolateral focal hypocontractility felt to be due to the circumflex stenosis, EF 50%.  . OA (osteoarthritis)   . Obesity (BMI 30-39.9) 08/07/2016  . Renal insufficiency    a. 2016 r/t dehydration/ACEI use.  Marland Kitchen Restless leg syndrome 09/16/2010  . Rheumatic fever   . Syncope 2009   associated with viral URI    Surgeries: Procedure(s): RIGHT TOTAL KNEE REVISION on 04/22/2018   Consultants:   Discharged Condition: Improved  Hospital Course: GIVANNI STARON is an 62 y.o. male who was admitted 04/22/2018 for operative treatment ofPain due to unicompartmental arthroplasty of knee ([place]). Patient has severe unremitting pain that affects sleep, daily activities, and work/hobbies. After pre-op clearance the patient was taken to the operating room on 04/22/2018 and underwent  Procedure(s): RIGHT TOTAL KNEE REVISION.    Patient was given perioperative antibiotics:  Anti-infectives (From admission, onward)   Start     Dose/Rate Route Frequency Ordered Stop   04/22/18 0600  vancomycin (VANCOCIN) 1,500 mg in sodium chloride 0.9 % 500 mL IVPB     1,500 mg 250 mL/hr over 120 Minutes Intravenous On call to O.R. 04/21/18 5366 04/22/18 4403       Patient was given sequential compression  devices, early ambulation, and chemoprophylaxis to prevent DVT.  Patient benefited maximally from hospital stay and there were no complications.    Recent vital signs:  Patient Vitals for the past 24 hrs:  BP Temp Temp src Pulse Resp SpO2  04/23/18 0916 125/75 97.7 F (36.5 C) Oral (!) 49 14 100 %  04/23/18 0556 126/82 98.1 F (36.7 C) Oral (!) 49 16 100 %  04/23/18 0138 138/78 98 F (36.7 C) Oral (!) 54 18 100 %  04/22/18 2224 139/80 97.9 F (36.6 C) Oral (!) 55 19 99 %  04/22/18 1431 120/81 97.7 F (36.5 C) Oral (!) 50 15 100 %  04/22/18 1337 127/87 (!) 97.5 F (36.4 C) Axillary (!) 53 16 100 %     Recent laboratory studies:  Recent Labs    04/23/18 0544  WBC 16.8*  HGB 10.5*  HCT 32.5*  PLT 254  NA 137  K 4.8  CL 105  CO2 29  BUN 16  CREATININE 1.00  GLUCOSE 148*  CALCIUM 8.5*     Discharge Medications:   Allergies as of 04/23/2018      Reactions   Penicillins Anaphylaxis   *Has tolerated cephalosporins in the past* Has patient had a PCN reaction causing immediate rash, facial/tongue/throat swelling, SOB or lightheadedness with hypotension: Yes Has patient had a PCN reaction causing severe rash involving mucus membranes or skin necrosis: No Has patient had a PCN reaction that required hospitalization: No Has patient had a PCN reaction occurring  within the last 10 years: No If all of the above answers are "NO", then may proceed with Cephalosporin use.   Tizanidine Other (See Comments)   High fevers, shaking   Latex Rash      Medication List    STOP taking these medications   ibuprofen 800 MG tablet Commonly known as:  ADVIL,MOTRIN   traMADol 50 MG tablet Commonly known as:  ULTRAM     TAKE these medications   amLODipine 10 MG tablet Commonly known as:  NORVASC TAKE 1 TABLET (10 MG TOTAL) BY MOUTH DAILY.   aspirin EC 81 MG tablet Take 1 tablet (81 mg total) by mouth 2 (two) times daily. What changed:    medication strength  how much to  take  Another medication with the same name was removed. Continue taking this medication, and follow the directions you see here.   atorvastatin 80 MG tablet Commonly known as:  LIPITOR TAKE 1 TABLET (80 MG TOTAL) BY MOUTH EVERY EVENING.   Evolocumab 140 MG/ML Soaj Commonly known as:  REPATHA SURECLICK Inject 119 mg into the skin every 14 (fourteen) days.   ezetimibe 10 MG tablet Commonly known as:  ZETIA Take 1 tablet (10 mg total) by mouth daily.   lisinopril 40 MG tablet Commonly known as:  PRINIVIL,ZESTRIL TAKE 1 TABLET BY MOUTH EVERY DAY   methocarbamol 500 MG tablet Commonly known as:  ROBAXIN Take 1 tablet (500 mg total) by mouth 2 (two) times daily with a meal.   metoprolol succinate 25 MG 24 hr tablet Commonly known as:  TOPROL XL Take 1 tablet (25 mg total) by mouth daily.   MULTI VITAMIN DAILY Tabs Take 1 tablet by mouth daily.   nitroGLYCERIN 0.4 MG SL tablet Commonly known as:  NITROSTAT Place 1 tablet (0.4 mg total) under the tongue every 5 (five) minutes as needed for chest pain. Up to 3 doses.   oxyCODONE-acetaminophen 5-325 MG tablet Commonly known as:  PERCOCET/ROXICET Take 1 tablet by mouth every 4 (four) hours as needed for severe pain.   spironolactone 25 MG tablet Commonly known as:  ALDACTONE TAKE 1/2 TABLET BY MOUTH TWICE A DAY   ticagrelor 90 MG Tabs tablet Commonly known as:  BRILINTA Take 1 tablet (90 mg total) by mouth every 12 (twelve) hours.            Durable Medical Equipment  (From admission, onward)        Start     Ordered   04/22/18 1150  DME Walker rolling  Once    Question:  Patient needs a walker to treat with the following condition  Answer:  Status post right knee replacement   04/22/18 1149   04/22/18 1150  DME 3 n 1  Once     04/22/18 1149       Discharge Care Instructions  (From admission, onward)        Start     Ordered   04/23/18 0000  Weight bearing as tolerated     04/23/18 1250       Diagnostic Studies: Dg Knee 1-2 Views Right  Result Date: 04/22/2018 CLINICAL DATA:  Total knee revision postop EXAM: RIGHT KNEE - 1-2 VIEW COMPARISON:  12/02/2002 FINDINGS: Total knee replacement in satisfactory position and alignment. No fracture and no acute complication. Gas in the soft tissues from recent surgery. IMPRESSION: Satisfactory total knee replacement. Electronically Signed   By: Franchot Gallo M.D.   On: 04/22/2018 11:20    Disposition:  Discharge disposition: 01-Home or Self Care       Discharge Instructions    Call MD / Call 911   Complete by:  As directed    If you experience chest pain or shortness of breath, CALL 911 and be transported to the hospital emergency room.  If you develope a fever above 101 F, pus (white drainage) or increased drainage or redness at the wound, or calf pain, call your surgeon's office.   Constipation Prevention   Complete by:  As directed    Drink plenty of fluids.  Prune juice may be helpful.  You may use a stool softener, such as Colace (over the counter) 100 mg twice a day.  Use MiraLax (over the counter) for constipation as needed.   Diet - low sodium heart healthy   Complete by:  As directed    Driving restrictions   Complete by:  As directed    No driving for 2 weeks   Increase activity slowly as tolerated   Complete by:  As directed    Patient may shower   Complete by:  As directed    You may shower without a dressing once there is no drainage.  Do not wash over the wound.  If drainage remains, cover wound with plastic wrap and then shower.   Weight bearing as tolerated   Complete by:  As directed       Follow-up Information    Frederik Pear, MD In 2 weeks.   Specialty:  Orthopedic Surgery Contact information: [address] [place] [address] [phone]            Signed: Joanell Rising 04/23/2018, 12:50 PM

## 2018-04-23 NOTE — Progress Notes (Signed)
PATIENT ID: Jacob Quinn  MRN: [id number]  DOB/AGE:  [birth year] / 62 y.o.  1 Day Post-Op Procedure(s) (LRB): RIGHT TOTAL KNEE REVISION (Right)    PROGRESS NOTE Subjective: Patient is alert, oriented, No Nausea, No Vomiting, yes passing gas. Taking PO Well. Denies SOB, Chest or Calf Pain. Using Incentive Spirometer, PAS in place. Ambulate Up to bathroomLast night, weightbearing as tolerated, Patient reports pain as 2/10 .    Objective: Vital signs in last 24 hours: Vitals:   04/22/18 1431 04/22/18 2224 04/23/18 0138 04/23/18 0556  BP: 120/81 139/80 138/78 126/82  Pulse: (Abnormal) 50 (Abnormal) 55 (Abnormal) 54 (Abnormal) 49  Resp: 15 19 18 16   Temp: 97.7 F (36.5 C) 97.9 F (36.6 C) 98 F (36.7 C) 98.1 F (36.7 C)  TempSrc: Oral Oral Oral Oral  SpO2: 100% 99% 100% 100%  Weight:      Height:          Intake/Output from previous day: I/O last 3 completed shifts: In: 4712.4 [P.O.:480; I.V.:3622.4; IV Piggyback:610] Out: 2800 [Urine:2400; Blood:400]   Intake/Output this shift: No intake/output data recorded.   LABORATORY DATA: Recent Labs    04/22/18 1646 04/22/18 2221 04/23/18 0544 04/23/18 0714  WBC  --   --  16.8*  --   HGB  --   --  10.5*  --   HCT  --   --  32.5*  --   PLT  --   --  254  --   NA  --   --  137  --   K  --   --  4.8  --   CL  --   --  105  --   CO2  --   --  29  --   BUN  --   --  16  --   CREATININE  --   --  1.00  --   GLUCOSE  --   --  148*  --   GLUCAP 138* 185*  --  152*  CALCIUM  --   --  8.5*  --     Examination: Neurologically intact ABD soft Neurovascular intact Sensation intact distally Intact pulses distally Dorsiflexion/Plantar flexion intact Incision: dressing C/D/I No cellulitis present Compartment soft}  Assessment:   1 Day Post-Op Procedure(s) (LRB): RIGHT TOTAL KNEE REVISION (Right) ADDITIONAL DIAGNOSIS: Expected Acute Blood Loss Anemia, Hypertension, Obesity  Patient's anticipated LOS is less than 2  midnights, meeting these requirements: - Younger than 60 - Lives within 1 hour of care - Has a competent adult at home to recover with post-op recover - NO history of  - Chronic pain requiring opiods  - Diabetes  - Coronary Artery Disease  - Heart failure  - Heart attack  - Stroke  - DVT/VTE  - Cardiac arrhythmia  - Respiratory Failure/COPD  - Renal failure  - Anemia  - Advanced Liver disease       Plan: PT/OT WBAT, AROM and PROM  DVT Prophylaxis:  SCDx72hrs, ASA 325 mg BID x 2 weeks DISCHARGE PLAN: Home DISCHARGE NEEDS: HHPT, Walker and 3-in-1 comode seat     Kerin Salen 04/23/2018, 7:36 AM

## 2018-04-23 NOTE — Progress Notes (Signed)
CSW consult-SNF Plan: Home No other needs identified, CSW signing off.    Kathrin Greathouse, Marlinda Mike, MSW Clinical Social Worker  [phone] 04/23/2018  9:51 AM

## 2018-04-27 LAB — AEROBIC/ANAEROBIC CULTURE W GRAM STAIN (SURGICAL/DEEP WOUND)
Culture: NO GROWTH
Gram Stain: NONE SEEN

## 2018-04-27 LAB — AEROBIC/ANAEROBIC CULTURE (SURGICAL/DEEP WOUND)

## 2018-04-30 ENCOUNTER — Encounter: Payer: Self-pay | Admitting: Family Medicine

## 2018-04-30 ENCOUNTER — Ambulatory Visit: Payer: Self-pay | Admitting: Adult Health

## 2018-04-30 ENCOUNTER — Ambulatory Visit (INDEPENDENT_AMBULATORY_CARE_PROVIDER_SITE_OTHER): Payer: BLUE CROSS/BLUE SHIELD | Admitting: Family Medicine

## 2018-04-30 VITALS — BP 116/80 | HR 74 | Temp 98.5°F | Ht 73.0 in

## 2018-04-30 DIAGNOSIS — R42 Dizziness and giddiness: Secondary | ICD-10-CM

## 2018-04-30 DIAGNOSIS — R739 Hyperglycemia, unspecified: Secondary | ICD-10-CM

## 2018-04-30 NOTE — Patient Instructions (Signed)
BEFORE YOU LEAVE: -labs -follow up: PCP in 2-4 weeks  Please call your cardiologist to let them know about the event yesterday and for evaluation.  Go to emergency room if any chest pain, feeling like you are passing out again or syncope, palpitations, severe headache or recurrence of symptoms. Call EMS if any recurrent symptoms.  Eat  healthy regular low sugar meals and drink water.  We have ordered labs or studies at this visit. It can take up to 1-2 weeks for results and processing. IF results require follow up or explanation, we will call you with instructions. Clinically stable results will be released to your [facility]. If you have not heard from [place] or cannot find your results in [facility] in 2 weeks please contact our office at [phone].  If you are not yet signed up for [facility], please consider signing up.

## 2018-04-30 NOTE — Telephone Encounter (Signed)
Appt was already scheduled in 54min slot to allow time to evaluate given symptoms.  Nothing further needed.

## 2018-04-30 NOTE — Telephone Encounter (Signed)
Pt called to report fainting episode that happened yesterday. Pt was sitting in a barber chair getting haircut and pt stated that he became hot and sweaty. During the seizure he was taken out of the chair onto the floor. He stated that when he became conscious, he was told he had seizure like activity. He said the event lasted for 1 minute.  2-3 months ago he had a near fainting episode. He said that he felt weak and began sweating, but never fainted. Denies any cardiac, neurological, or GI sx. Pt is alert and oriented x 3 during call.  Disposition was to go to ED or PCP alternative with approval. Called PCP office and spoke to [place]. Asked to sent triage note back to PCP office for review. Reason for Disposition . [1] Age > 50 years  AND [2] now alert and feels fine  Answer Assessment - Initial Assessment Questions 1. ONSET: "How long were you unconscious?" (minutes) "When did it happen?"     1 minute yesterday 2. CONTENT: "What happened during period of unconsciousness?" (e.g., seizure activity)      Looked like seizure yesterday 3. MENTAL STATUS: "Alert and oriented now?" (oriented x 3 = name, month, location)      Yes  4. TRIGGER: "What do you think caused the fainting?" "What were you doing just before you fainted?"  (e.g., exercise, sudden standing up, prolonged standing)     Dehydration-in chair getting a haircut 5. RECURRENT SYMPTOM: "Have you ever passed out before?" If so, ask: "When was the last time?" and "What happened that time?"      2-3 months ago sweating never fully fainted just felt weak 6. INJURY: "Did you sustain any injury during the fall?"      No fall no injuries 7. CARDIAC SYMPTOMS: "Have you had any of the following symptoms: chest pain, difficulty breathing, palpitations?"     no 8. NEUROLOGIC SYMPTOMS: "Have you had any of the following symptoms: headache, numbness, vertigo, weakness?"     no 9. GI SYMPTOMS: "Have you had any of the following symptoms: abdominal  pain, vomiting, diarrhea, blood in stools?"     no 10. OTHER SYMPTOMS: "Do you have any other symptoms?"       no 11. PREGNANCY: "Is there any chance you are pregnant?" "When was your last menstrual period?"       n/a  Protocols used: [facility]

## 2018-04-30 NOTE — Telephone Encounter (Addendum)
Spoke with Nurse Triage and it was discussed ED visit vs Office Visit.  Pt stated to NT that this was not the first syncopal event that he has had but this the first time that he has had Seizure like activity and "loss of consciousness" with the event. NT stated that she was hesitant to send to the ED based on previous similar events. Pt was oriented during call and stated all symptoms were resolved.  After reviewing the chart, pt has a history of recent sepsis 10/2017 following total knee replacement. Pt was seen in ED 04/22/18 for pain in Right Knee - underwent Total Right Knee Revision Sx on 04/22/18   Called and spoke with patient- Pt does not want to go to ED - states that he wanted to be seen in our office today before he would go there.   Pt states that he has not been eating right, he is diabetic, thinks event came from his sugar levels being out of range.  No current symptoms, everything is resolved. Pt said sister is a Marine scientist and she said that he just needed to see his PCP.  Will schedule with Dr Maudie Mercury today as PCP is not available and will send to Provider to review if need to go on to ED instead of OV today.

## 2018-04-30 NOTE — Progress Notes (Addendum)
HPI:  Using dictation device. Unfortunately this device frequently misinterprets words/phrases.  Jacob Quinn is a pleasant 62 yo with a PMH CV dz, HTN, HLD (sees cardiology for management), hx syncope here for acute visit for ? Syncope yesterday. Per pt and daughter whom was present yesterday it was hot and he was at [employer] gallery [employer] shop. He had eaten 2 banana sandwiches, drank Gatorade and ate cereal prior. Then felt sweaty and dizzy. Went to stand up and questionable brief syncope but barber caught him and laid him on the floor. Denies any cp, sob, weakness, numbness, speech changes, vision changes, HA, injury at the time. Felt it was sugar related. Reports has hx prediabetes and wants to check his labs. Had knee surgery a few weeks ago and reports is healing well. Denies any symptoms since. Denies ha, NVD, bleeding, rash or any other symptoms. Daughter reports was not concerned she passes out when she gets hot. Apparently pt has passed out before as well.  ROS: See pertinent positives and negatives per HPI.  Past Medical History:  Diagnosis Date  . 1st degree AV block   . BPH associated with nocturia   . CAD in native artery    a. NSTEMI 07/2016 -  LHC 08/07/16 showing 70% D1, 20% mLAD, segmental 95% then 75% OM stenosis, 85% acute marg, normal LVEDP - received PTCA/DES to OM.  Marland Kitchen Encephalitis, viral   . Hyperlipidemia 08/07/2016  . Hypertension   . Ischemic cardiomyopathy    a. LHC 07/2016 - mild mid anterolateral focal hypocontractility felt to be due to the circumflex stenosis, EF 50%.  . OA (osteoarthritis)   . Obesity (BMI 30-39.9) 08/07/2016  . Renal insufficiency    a. 2016 r/t dehydration/ACEI use.  Marland Kitchen Restless leg syndrome 09/16/2010  . Rheumatic fever   . Syncope 2009   associated with viral URI    Past Surgical History:  Procedure Laterality Date  . CARDIAC CATHETERIZATION N/A 08/07/2016   Procedure: Left Heart Cath and Coronary Angiography;  Surgeon: Troy Sine, MD;   Location: [place] [facility];  Service: Cardiovascular;  Laterality: N/A;  . CARDIAC CATHETERIZATION N/A 08/07/2016   Procedure: Coronary Stent Intervention;  Surgeon: Troy Sine, MD;  Location: [place] [facility];  Service: Cardiovascular;  Laterality: N/A;  . REPLACEMENT TOTAL KNEE     left and right-  5 total   . TONSILLECTOMY    . TOTAL KNEE REVISION Left 11/19/2017   Procedure: LEFT TOTAL KNEE REVISION;  Surgeon: Frederik Pear, MD;  Location: [place];  Service: Orthopedics;  Laterality: Left;  . TOTAL KNEE REVISION Right 04/22/2018   Procedure: RIGHT TOTAL KNEE REVISION;  Surgeon: Frederik Pear, MD;  Location: [facility];  Service: Orthopedics;  Laterality: Right;  block    Family History  Problem Relation Age of Onset  . Hypertension Mother   . Heart disease Mother   . Prostate cancer Father   . Dementia Father   . Depression Brother   . Hypertension Brother   . Hypertension Brother   . Stroke Brother     SOCIAL HX: see hpi   Current Outpatient Medications:  .  amLODipine (NORVASC) 10 MG tablet, TAKE 1 TABLET (10 MG TOTAL) BY MOUTH DAILY., Disp: 30 tablet, Rfl: 6 .  aspirin EC 81 MG tablet, Take 1 tablet (81 mg total) by mouth 2 (two) times daily., Disp: 60 tablet, Rfl: 0 .  atorvastatin (LIPITOR) 80 MG tablet, TAKE 1 TABLET (80 MG TOTAL) BY MOUTH EVERY  EVENING., Disp: 30 tablet, Rfl: 6 .  Evolocumab (REPATHA SURECLICK) 335 MG/ML SOAJ, Inject 140 mg into the skin every 14 (fourteen) days., Disp: 2 pen, Rfl: 12 .  lisinopril (PRINIVIL,ZESTRIL) 40 MG tablet, TAKE 1 TABLET BY MOUTH EVERY DAY, Disp: 90 tablet, Rfl: 3 .  methocarbamol (ROBAXIN) 500 MG tablet, Take 1 tablet (500 mg total) by mouth 2 (two) times daily with a meal., Disp: 60 tablet, Rfl: 0 .  metoprolol succinate (TOPROL XL) 25 MG 24 hr tablet, Take 1 tablet (25 mg total) by mouth daily., Disp: 90 tablet, Rfl: 3 .  Multiple Vitamin (MULTI VITAMIN DAILY) TABS, Take 1 tablet by mouth daily., Disp: , Rfl:  .  nitroGLYCERIN  (NITROSTAT) 0.4 MG SL tablet, Place 1 tablet (0.4 mg total) under the tongue every 5 (five) minutes as needed for chest pain. Up to 3 doses., Disp: 25 tablet, Rfl: 3 .  oxyCODONE-acetaminophen (PERCOCET/ROXICET) 5-325 MG tablet, Take 1 tablet by mouth every 4 (four) hours as needed for severe pain., Disp: 30 tablet, Rfl: 0 .  spironolactone (ALDACTONE) 25 MG tablet, TAKE 1/2 TABLET BY MOUTH TWICE A DAY, Disp: 90 tablet, Rfl: 3 .  ticagrelor (BRILINTA) 90 MG TABS tablet, Take 1 tablet (90 mg total) by mouth every 12 (twelve) hours., Disp: 180 tablet, Rfl: 3 .  ezetimibe (ZETIA) 10 MG tablet, Take 1 tablet (10 mg total) by mouth daily. (Patient not taking: Reported on 04/04/2018), Disp: 90 tablet, Rfl: 3  EXAM:  Vitals:   04/30/18 1501  BP: 116/80  Pulse: 74  Temp: 98.5 F (36.9 C)  SpO2: 98%    Body mass index is 32.59 kg/m.  GENERAL: vitals reviewed and listed above, alert, oriented, appears well hydrated and in no acute distress  HEENT: atraumatic, conjunttiva clear, no obvious abnormalities on inspection of external nose and ears  NECK: no obvious masses on inspection  LUNGS: clear to auscultation bilaterally, no wheezes, rales or rhonchi, good air movement  CV: HRRR, I/VI SEM, tr bilat ankle edema, some swelling around R knee c/w recent surgery  MS: moves arms without sig abnormality. Favors knee  PSYCH: pleasant and cooperative, no obvious depression or anxiety, speech and thought processing grossly intact, CN II-XII grossly intact  ASSESSMENT AND PLAN:  Discussed the following assessment and plan: More than 50% of over 25 minutes spent in total in caring for this patient was spent face-to-face with the patient, counseling and/or coordinating care.    Dizziness - Plan: Basic metabolic panel, CBC  Hyperglycemia  -we discussed possible serious and likely etiologies, workup and treatment, treatment risks and return precautions - discussed various causes of syncope or  presyncope - this sounds like it may have been related to orthostatic change or sugar. Advised he call his cardiologist thought, given his hx for eval there as well. -after this discussion, [name] opted for labs, and agrees to call his cardiologist, agrees to follow up with PCP and seek emergency care if recurs -follow up advised 2-4 weeks -of course, we advised Ty  to return or notify a doctor immediately if symptoms worsen or persist or new concerns arise.    Patient Instructions  BEFORE YOU LEAVE: -labs -follow up: PCP in 2-4 weeks  Please call your cardiologist to let them know about the event yesterday and for evaluation.  Go to emergency room if any chest pain, feeling like you are passing out again or syncope, palpitations, severe headache or recurrence of symptoms. Call EMS if any recurrent symptoms.  Eat  healthy regular low sugar meals and drink water.  We have ordered labs or studies at this visit. It can take up to 1-2 weeks for results and processing. IF results require follow up or explanation, we will call you with instructions. Clinically stable results will be released to your [facility]. If you have not heard from [place] or cannot find your results in [facility] in 2 weeks please contact our office at [phone].  If you are not yet signed up for [facility], please consider signing up.           Lucretia Kern, DO

## 2018-04-30 NOTE — Addendum Note (Signed)
Addended by: Lucretia Kern on: 04/30/2018 03:40 PM   Modules accepted: Level of Service

## 2018-04-30 NOTE — Telephone Encounter (Signed)
Not too much we can do here for quick work up of syncope - should have gone to hospital yesterday. Happy to see him today - should have been 30 minutes though since hospital recently I think.

## 2018-05-01 ENCOUNTER — Ambulatory Visit (INDEPENDENT_AMBULATORY_CARE_PROVIDER_SITE_OTHER): Payer: BLUE CROSS/BLUE SHIELD | Admitting: Cardiology

## 2018-05-01 ENCOUNTER — Encounter: Payer: Self-pay | Admitting: Cardiology

## 2018-05-01 VITALS — Ht 73.0 in | Wt 250.6 lb

## 2018-05-01 DIAGNOSIS — Z96659 Presence of unspecified artificial knee joint: Secondary | ICD-10-CM | POA: Diagnosis not present

## 2018-05-01 DIAGNOSIS — E785 Hyperlipidemia, unspecified: Secondary | ICD-10-CM | POA: Diagnosis not present

## 2018-05-01 DIAGNOSIS — I252 Old myocardial infarction: Secondary | ICD-10-CM

## 2018-05-01 DIAGNOSIS — T84018D Broken internal joint prosthesis, other site, subsequent encounter: Secondary | ICD-10-CM | POA: Diagnosis not present

## 2018-05-01 DIAGNOSIS — I255 Ischemic cardiomyopathy: Secondary | ICD-10-CM

## 2018-05-01 DIAGNOSIS — I2510 Atherosclerotic heart disease of native coronary artery without angina pectoris: Secondary | ICD-10-CM

## 2018-05-01 DIAGNOSIS — I10 Essential (primary) hypertension: Secondary | ICD-10-CM | POA: Diagnosis not present

## 2018-05-01 DIAGNOSIS — R55 Syncope and collapse: Secondary | ICD-10-CM | POA: Diagnosis not present

## 2018-05-01 DIAGNOSIS — Z9861 Coronary angioplasty status: Secondary | ICD-10-CM | POA: Diagnosis not present

## 2018-05-01 LAB — CBC
HCT: 32.1 % — ABNORMAL LOW (ref 39.0–52.0)
Hemoglobin: 10.4 g/dL — ABNORMAL LOW (ref 13.0–17.0)
MCHC: 32.5 g/dL (ref 30.0–36.0)
MCV: 85.1 fl (ref 78.0–100.0)
Platelets: 482 10*3/uL — ABNORMAL HIGH (ref 150.0–400.0)
RBC: 3.76 Mil/uL — AB (ref 4.22–5.81)
RDW: 14.4 % (ref 11.5–15.5)
WBC: 11.7 10*3/uL — ABNORMAL HIGH (ref 4.0–10.5)

## 2018-05-01 LAB — BASIC METABOLIC PANEL
BUN: 10 mg/dL (ref 6–23)
CALCIUM: 9.4 mg/dL (ref 8.4–10.5)
CO2: 28 mEq/L (ref 19–32)
CREATININE: 1.16 mg/dL (ref 0.40–1.50)
Chloride: 97 mEq/L (ref 96–112)
GFR: 81.56 mL/min (ref >60.00)
Glucose, Bld: 98 mg/dL (ref 70–99)
Potassium: 4.6 mEq/L (ref 3.5–5.1)
SODIUM: 134 meq/L — AB (ref 135–145)

## 2018-05-01 MED ORDER — LISINOPRIL 20 MG PO TABS: 20.0000 mg | ORAL_TABLET | Freq: Every day | ORAL | 2 refills | Status: DC

## 2018-05-01 MED ORDER — TICAGRELOR 90 MG PO TABS: 90.0000 mg | ORAL_TABLET | Freq: Two times a day (BID) | ORAL | 3 refills | Status: DC

## 2018-05-01 NOTE — Assessment & Plan Note (Signed)
On high dose statin- due for f/u lipids

## 2018-05-01 NOTE — Assessment & Plan Note (Signed)
S/P revision 04/22/18- Dr Mayer Camel

## 2018-05-01 NOTE — Assessment & Plan Note (Signed)
Controlled.  

## 2018-05-01 NOTE — Assessment & Plan Note (Signed)
Oct 2017

## 2018-05-01 NOTE — Assessment & Plan Note (Signed)
OM PCI with DES Oct 2017 Low risk Myoview Oct 2018

## 2018-05-01 NOTE — Assessment & Plan Note (Signed)
EF 46% by [facility] Oct 2017

## 2018-05-01 NOTE — Progress Notes (Signed)
05/01/2018 Jacob Quinn   [birth year]  [id number]  Primary Physician Dorothyann Peng, NP Primary Cardiologist: Dr Claiborne Billings  HPI:  62 y/o male followed by Dr Claiborne Billings with a history of CAD, s/p NSTEMI Oct 2017 treated with OM PCI and DES. Myoview Oct 2018 was low risk. He did have residual 70% Dx1 and 85% RV branch. EF at cath was 50%. EF by Myoview Oct 2018 was 46%. He recently had a revision (his 7th knee surgery) of Rt TKR on 04/22/18.  On 04/30/18 he called his PCP after he had a syncopal spell.  He was at the barber shop when he became dizzy and sweaty after standing up. The barber caught him and laid him down. No obvious seizure activity noted. The pt denies any chest pain or palpitations pre syncope. He says he could tell he was going down. He tells me his shirt was soaked with with sweat. He has not had any recurrent episodes. In the office he is orthostatic- laying B/P 154/92, standing 118/ 78.  The pt did say he took his pain medication and Robaxin that morning before the episode.    Current Outpatient Medications  Medication Sig Dispense Refill  . amLODipine (NORVASC) 10 MG tablet TAKE 1 TABLET (10 MG TOTAL) BY MOUTH DAILY. 30 tablet 6  . aspirin EC 81 MG tablet Take 1 tablet (81 mg total) by mouth 2 (two) times daily. 60 tablet 0  . atorvastatin (LIPITOR) 80 MG tablet TAKE 1 TABLET (80 MG TOTAL) BY MOUTH EVERY EVENING. 30 tablet 6  . Evolocumab (REPATHA SURECLICK) 599 MG/ML SOAJ Inject 140 mg into the skin every 14 (fourteen) days. 2 pen 12  . ezetimibe (ZETIA) 10 MG tablet Take 1 tablet (10 mg total) by mouth daily. 90 tablet 3  . Multiple Vitamin (MULTI VITAMIN DAILY) TABS Take 1 tablet by mouth daily.    . nitroGLYCERIN (NITROSTAT) 0.4 MG SL tablet Place 1 tablet (0.4 mg total) under the tongue every 5 (five) minutes as needed for chest pain. Up to 3 doses. 25 tablet 3  . oxyCODONE-acetaminophen (PERCOCET/ROXICET) 5-325 MG tablet Take 1 tablet by mouth every 4 (four) hours as needed for  severe pain. 30 tablet 0  . spironolactone (ALDACTONE) 25 MG tablet TAKE 1/2 TABLET BY MOUTH TWICE A DAY 90 tablet 3  . ticagrelor (BRILINTA) 90 MG TABS tablet Take 1 tablet (90 mg total) by mouth every 12 (twelve) hours. 180 tablet 3   No current facility-administered medications for this visit.     Allergies  Allergen Reactions  . Penicillins Anaphylaxis    *Has tolerated cephalosporins in the past*  Has patient had a PCN reaction causing immediate rash, facial/tongue/throat swelling, SOB or lightheadedness with hypotension: Yes Has patient had a PCN reaction causing severe rash involving mucus membranes or skin necrosis: No Has patient had a PCN reaction that required hospitalization: No Has patient had a PCN reaction occurring within the last 10 years: No If all of the above answers are "NO", then may proceed with Cephalosporin use.   . Tizanidine Other (See Comments)    High fevers, shaking  . Latex Rash    Past Medical History:  Diagnosis Date  . 1st degree AV block   . BPH associated with nocturia   . CAD in native artery    a. NSTEMI 07/2016 -  LHC 08/07/16 showing 70% D1, 20% mLAD, segmental 95% then 75% OM stenosis, 85% acute marg, normal LVEDP - received PTCA/DES to OM.  Marland Kitchen  Encephalitis, viral   . Hyperlipidemia 08/07/2016  . Hypertension   . Ischemic cardiomyopathy    a. LHC 07/2016 - mild mid anterolateral focal hypocontractility felt to be due to the circumflex stenosis, EF 50%.  . OA (osteoarthritis)   . Obesity (BMI 30-39.9) 08/07/2016  . Renal insufficiency    a. 2016 r/t dehydration/ACEI use.  Marland Kitchen Restless leg syndrome 09/16/2010  . Rheumatic fever   . Syncope 2009   associated with viral URI    Social History   Socioeconomic History  . Marital status: Single    Spouse name: Not on file  . Number of children: 3  . Years of education: Not on file  . Highest education level: Not on file  Occupational History  . Occupation: English as a second language teacher: [employer]    Comment: [name]  Social Needs  . Financial resource strain: Not on file  . Food insecurity:    Worry: Not on file    Inability: Not on file  . Transportation needs:    Medical: Not on file    Non-medical: Not on file  Tobacco Use  . Smoking status: Never Smoker  . Smokeless tobacco: Never Used  Substance and Sexual Activity  . Alcohol use: No  . Drug use: No  . Sexual activity: Yes  Lifestyle  . Physical activity:    Days per week: Not on file    Minutes per session: Not on file  . Stress: Not on file  Relationships  . Social connections:    Talks on phone: Not on file    Gets together: Not on file    Attends religious service: Not on file    Active member of club or organization: Not on file    Attends meetings of clubs or organizations: Not on file    Relationship status: Not on file  . Intimate partner violence:    Fear of current or ex partner: Not on file    Emotionally abused: Not on file    Physically abused: Not on file    Forced sexual activity: Not on file  Other Topics Concern  . Not on file  Social History Narrative   Lives with daughter   Is retired -      Family History  Problem Relation Age of Onset  . Hypertension Mother   . Heart disease Mother   . Prostate cancer Father   . Dementia Father   . Depression Brother   . Hypertension Brother   . Hypertension Brother   . Stroke Brother      Review of Systems: General: negative for chills, fever, night sweats or weight changes.  Cardiovascular: negative for chest pain, dyspnea on exertion, edema, orthopnea, palpitations, paroxysmal nocturnal dyspnea or shortness of breath Dermatological: negative for rash Respiratory: negative for cough or wheezing Urologic: negative for hematuria Abdominal: negative for nausea, vomiting, diarrhea, bright red blood per rectum, melena, or hematemesis Neurologic: negative for visual changes, syncope, or dizziness All other systems reviewed  and are otherwise negative except as noted above.    Height 6\' 1"  (1.854 m), weight 250 lb 9.6 oz (113.7 kg).  General appearance: alert, cooperative and no distress Neck: no carotid bruit and no JVD Lungs: clear to auscultation bilaterally Heart: regular rate and rhythm Extremities: no edema Skin: Skin color, texture, turgor normal. No rashes or lesions Neurologic: Grossly normal  EKG NSR, LVH, QTc 413  ASSESSMENT AND PLAN:   Failure of total knee  arthroplasty [facility]) S/P revision 04/22/18- Dr Mayer Camel  CAD S/P percutaneous coronary angioplasty OM PCI with DES Oct 2017 Low risk Myoview Oct 2018  History of NSTEMI Oct 2017  Dyslipidemia, goal LDL below 70 On high dose statin- due for f/u lipids  Essential hypertension Controlled  Cardiomyopathy, ischemic EF 46% by Myoview Oct 2017  Syncope and collapse Seen today for follow up after a syncopal spell post op knee surgery.    PLAN  I suggested the pt make sure he stays hydrated. I suspect he had syncope from a combination of anti hypertensive medication and pain medication. At discharge there was a lot of confusion about what he was taking. I reviewed his medications one by one- twice.  He is not taking Aldactone, Lipitor, or Zetia. He is taking Lisinopril, Amlodipine, Repatha, ASA, Brilinta, and Toprol (though he had initially indicated he wasn't taking Lisinopril or Toprol). I suggested we cut his Lisinopril to 20 mg and change to the timing to QHS. I also ordered an echo to check his LVF. F/U with Dr Claiborne Billings in Oct.   Kerin Ransom PA-C 05/01/2018 2:17 PM

## 2018-05-01 NOTE — Patient Instructions (Addendum)
Medication Instructions:  DECREASE- Lisinopril 20 mg at bedtime  If you need a refill on your cardiac medications before your next appointment, please call your pharmacy.  Labwork: None ordered   Testing/Procedures: Your physician has requested that you have an echocardiogram. Echocardiography is a painless test that uses sound waves to create images of your heart. It provides your doctor with information about the size and shape of your heart and how well your heart's chambers and valves are working. This procedure takes approximately one hour. There are no restrictions for this procedure.   Follow-Up: Your physician wants you to follow-up in: October 2019 with Dr Claiborne Billings.     Thank you for choosing [facility]!!

## 2018-05-01 NOTE — Assessment & Plan Note (Signed)
Seen today for follow up after a syncopal spell post op knee surgery.

## 2018-05-06 NOTE — Addendum Note (Signed)
Addended by: Vennie Homans on: 05/06/2018 09:44 AM   Modules accepted: Orders

## 2018-05-07 ENCOUNTER — Ambulatory Visit (HOSPITAL_COMMUNITY): Payer: BLUE CROSS/BLUE SHIELD | Attending: Cardiovascular Disease

## 2018-05-07 ENCOUNTER — Other Ambulatory Visit: Payer: Self-pay

## 2018-05-07 DIAGNOSIS — R55 Syncope and collapse: Secondary | ICD-10-CM | POA: Diagnosis not present

## 2018-05-07 DIAGNOSIS — I2510 Atherosclerotic heart disease of native coronary artery without angina pectoris: Secondary | ICD-10-CM | POA: Diagnosis not present

## 2018-05-07 DIAGNOSIS — I252 Old myocardial infarction: Secondary | ICD-10-CM | POA: Diagnosis not present

## 2018-05-07 DIAGNOSIS — I255 Ischemic cardiomyopathy: Secondary | ICD-10-CM | POA: Insufficient documentation

## 2018-05-07 DIAGNOSIS — E669 Obesity, unspecified: Secondary | ICD-10-CM | POA: Insufficient documentation

## 2018-05-07 DIAGNOSIS — I10 Essential (primary) hypertension: Secondary | ICD-10-CM | POA: Insufficient documentation

## 2018-05-07 DIAGNOSIS — Z6833 Body mass index (BMI) 33.0-33.9, adult: Secondary | ICD-10-CM | POA: Diagnosis not present

## 2018-05-07 DIAGNOSIS — E785 Hyperlipidemia, unspecified: Secondary | ICD-10-CM | POA: Insufficient documentation

## 2018-05-07 DIAGNOSIS — I351 Nonrheumatic aortic (valve) insufficiency: Secondary | ICD-10-CM | POA: Diagnosis not present

## 2018-05-17 NOTE — Telephone Encounter (Signed)
error 

## 2018-05-21 ENCOUNTER — Ambulatory Visit (INDEPENDENT_AMBULATORY_CARE_PROVIDER_SITE_OTHER): Payer: BLUE CROSS/BLUE SHIELD | Admitting: Adult Health

## 2018-05-21 ENCOUNTER — Encounter: Payer: Self-pay | Admitting: Adult Health

## 2018-05-21 VITALS — BP 130/80 | Temp 98.5°F | Wt 246.0 lb

## 2018-05-21 DIAGNOSIS — R351 Nocturia: Secondary | ICD-10-CM | POA: Diagnosis not present

## 2018-05-21 DIAGNOSIS — D696 Thrombocytopenia, unspecified: Secondary | ICD-10-CM | POA: Diagnosis not present

## 2018-05-21 DIAGNOSIS — N401 Enlarged prostate with lower urinary tract symptoms: Secondary | ICD-10-CM

## 2018-05-21 LAB — CBC WITH DIFFERENTIAL/PLATELET
BASOS ABS: 0.1 10*3/uL (ref 0.0–0.1)
Basophils Relative: 0.9 % (ref 0.0–3.0)
EOS ABS: 0.2 10*3/uL (ref 0.0–0.7)
Eosinophils Relative: 2.5 % (ref 0.0–5.0)
HEMATOCRIT: 36.6 % — AB (ref 39.0–52.0)
Hemoglobin: 11.9 g/dL — ABNORMAL LOW (ref 13.0–17.0)
LYMPHS PCT: 26.4 % (ref 12.0–46.0)
Lymphs Abs: 2.2 10*3/uL (ref 0.7–4.0)
MCHC: 32.4 g/dL (ref 30.0–36.0)
MCV: 86.4 fl (ref 78.0–100.0)
MONOS PCT: 5.4 % (ref 3.0–12.0)
Monocytes Absolute: 0.5 10*3/uL (ref 0.1–1.0)
NEUTROS ABS: 5.5 10*3/uL (ref 1.4–7.7)
NEUTROS PCT: 64.8 % (ref 43.0–77.0)
PLATELETS: 390 10*3/uL (ref 150.0–400.0)
RBC: 4.24 Mil/uL (ref 4.22–5.81)
RDW: 15.2 % (ref 11.5–15.5)
WBC: 8.4 10*3/uL (ref 4.0–10.5)

## 2018-05-21 LAB — PSA: PSA: 2.72 ng/mL (ref 0.10–4.00)

## 2018-05-21 MED ORDER — TAMSULOSIN HCL 0.4 MG PO CAPS: 0.4000 mg | ORAL_CAPSULE | Freq: Every day | ORAL | 3 refills | Status: DC

## 2018-05-21 NOTE — Progress Notes (Signed)
Subjective:    Patient ID: Jacob Quinn, male    DOB: [birth year], 62 y.o.   MRN: [id number]  HPI  62 year old male who  has a past medical history of 1st degree AV block, BPH associated with nocturia, CAD in native artery, Encephalitis, viral, Hyperlipidemia (08/07/2016), Hypertension, Ischemic cardiomyopathy, OA (osteoarthritis), Obesity (BMI 30-39.9) (08/07/2016), Renal insufficiency, Restless leg syndrome (09/16/2010), Rheumatic fever, and Syncope (2009).  Presents to the office today for follow-up after been seen by another provider for syncopal episode.  At that time blood work was drawn and it noticed that his platelet count was 482.  He was advised to follow-up with cardiology for syncopal episode, which he did ( believe it was a combination of two much BP meds and pain medication) and then follow-up with me for the labs. He denies any syncopal or pre syncopal episodes since that time.   His only complaint is that of nocturia. He reports often he has been having to get up 7-8 times a night to urinate, feels as though he is not emptying his bladder completely, and decreased stream. Denies burning with urination.   Review of Systems See HPI   Past Medical History:  Diagnosis Date  . 1st degree AV block   . BPH associated with nocturia   . CAD in native artery    a. NSTEMI 07/2016 -  LHC 08/07/16 showing 70% D1, 20% mLAD, segmental 95% then 75% OM stenosis, 85% acute marg, normal LVEDP - received PTCA/DES to OM.  Marland Kitchen Encephalitis, viral   . Hyperlipidemia 08/07/2016  . Hypertension   . Ischemic cardiomyopathy    a. LHC 07/2016 - mild mid anterolateral focal hypocontractility felt to be due to the circumflex stenosis, EF 50%.  . OA (osteoarthritis)   . Obesity (BMI 30-39.9) 08/07/2016  . Renal insufficiency    a. 2016 r/t dehydration/ACEI use.  Marland Kitchen Restless leg syndrome 09/16/2010  . Rheumatic fever   . Syncope 2009   associated with viral URI    Social History   Socioeconomic  History  . Marital status: Single    Spouse name: Not on file  . Number of children: 3  . Years of education: Not on file  . Highest education level: Not on file  Occupational History  . 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 Financial resource strain: Not on file  . Food insecurity:    Worry: Not on file    Inability: Not on file  . Transportation needs:    Medical: Not on file    Non-medical: Not on file  Tobacco Use  . Smoking status: Never Smoker  . Smokeless tobacco: Never Used  Substance and Sexual Activity  . Alcohol use: No  . Drug use: No  . Sexual activity: Yes  Lifestyle  . Physical activity:    Days per week: Not on file    Minutes per session: Not on file  . Stress: Not on file  Relationships  . Social connections:    Talks on phone: Not on file    Gets together: Not on file    Attends religious service: Not on file    Active member of club or organization: Not on file    Attends meetings of clubs or organizations: Not on file    Relationship status: Not on file  . Intimate partner violence:    Fear of current or ex  partner: Not on file    Emotionally abused: Not on file    Physically abused: Not on file    Forced sexual activity: Not on file  Other Topics Concern  . Not on file  Social History Narrative   Lives with daughter   Is retired -     Past Surgical History:  Procedure Laterality Date  . CARDIAC CATHETERIZATION N/A 08/07/2016   Procedure: Left Heart Cath and Coronary Angiography;  Surgeon: Troy Sine, MD;  Location: [facility];  Service: Cardiovascular;  Laterality: N/A;  . CARDIAC CATHETERIZATION N/A 08/07/2016   Procedure: Coronary Stent Intervention;  Surgeon: Troy Sine, MD;  Location: [place] [facility];  Service: Cardiovascular;  Laterality: N/A;  . REPLACEMENT TOTAL KNEE     left and right-  5 total   . TONSILLECTOMY    . TOTAL KNEE REVISION Left 11/19/2017     Procedure: LEFT TOTAL KNEE REVISION;  Surgeon: Frederik Pear, MD;  Location: [place];  Service: Orthopedics;  Laterality: Left;  . TOTAL KNEE REVISION Right 04/22/2018   Procedure: RIGHT TOTAL KNEE REVISION;  Surgeon: Frederik Pear, MD;  Location: [facility];  Service: Orthopedics;  Laterality: Right;  block    Family History  Problem Relation Age of Onset  . Hypertension Mother   . Heart disease Mother   . Prostate cancer Father   . Dementia Father   . Depression Brother   . Hypertension Brother   . Hypertension Brother   . Stroke Brother     Allergies  Allergen Reactions  . Penicillins Anaphylaxis    *Has tolerated cephalosporins in the past*  Has patient had a PCN reaction causing immediate rash, facial/tongue/throat swelling, SOB or lightheadedness with hypotension: Yes Has patient had a PCN reaction causing severe rash involving mucus membranes or skin necrosis: No Has patient had a PCN reaction that required hospitalization: No Has patient had a PCN reaction occurring within the last 10 years: No If all of the above answers are "NO", then may proceed with Cephalosporin use.   . Tizanidine Other (See Comments)    High fevers, shaking  . Latex Rash    Current Outpatient Medications on File Prior to Visit  Medication Sig Dispense Refill  . amLODipine (NORVASC) 10 MG tablet TAKE 1 TABLET (10 MG TOTAL) BY MOUTH DAILY. 30 tablet 6  . aspirin EC 81 MG tablet Take 1 tablet (81 mg total) by mouth 2 (two) times daily. 60 tablet 0  . Evolocumab (REPATHA SURECLICK) 237 MG/ML SOAJ Inject 140 mg into the skin every 14 (fourteen) days. 2 pen 12  . lisinopril (PRINIVIL,ZESTRIL) 20 MG tablet Take 1 tablet (20 mg total) by mouth daily. 90 tablet 2  . metoprolol succinate (TOPROL-XL) 25 MG 24 hr tablet Take 25 mg by mouth daily.    . Multiple Vitamin (MULTI VITAMIN DAILY) TABS Take 1 tablet by mouth daily.    . nitroGLYCERIN (NITROSTAT) 0.4 MG SL tablet Place 1 tablet (0.4 mg total) under the  tongue every 5 (five) minutes as needed for chest pain. Up to 3 doses. 25 tablet 3  . oxyCODONE-acetaminophen (PERCOCET/ROXICET) 5-325 MG tablet Take 1 tablet by mouth every 4 (four) hours as needed for severe pain. 30 tablet 0  . ticagrelor (BRILINTA) 90 MG TABS tablet Take 1 tablet (90 mg total) by mouth every 12 (twelve) hours. 180 tablet 3   No current facility-administered medications on file prior to visit.     BP 130/80  Temp 98.5 F (36.9 C) (Oral)   Wt 246 lb (111.6 kg)   BMI 32.46 kg/m       Objective:   Physical Exam  Constitutional: He is oriented to person, place, and time. He appears well-developed and well-nourished. No distress.  Cardiovascular: Normal rate, regular rhythm, normal heart sounds and intact distal pulses.  Pulmonary/Chest: Effort normal and breath sounds normal.  Musculoskeletal: Normal range of motion. He exhibits no edema, tenderness or deformity.  Neurological: He is alert and oriented to person, place, and time.  Skin: Skin is warm and dry. He is not diaphoretic.  Psychiatric: He has a normal mood and affect. His behavior is normal. Judgment and thought content normal.  Nursing note and vitals reviewed.     Assessment & Plan:   1. Thrombocytopenia ([place]) - Unknown cause at this time. He is not using any supplement use  - CBC with Differential/Platelet - HIV antibody - Hep C Antibody - tamsulosin (FLOMAX) 0.4 MG CAPS capsule; Take 1 capsule (0.4 mg total) by mouth daily.  Dispense: 30 capsule; Refill: 3 - Consider blood smear  - Consider referral to hematology   2. BPH associated with nocturia - Will trial Flomax 0.4 mg daily.  - POC Urinalysis Dipstick - PSA - Follow up if no improvement in 2-3 weeks   Dorothyann Peng, NP

## 2018-05-22 LAB — HIV ANTIBODY (ROUTINE TESTING W REFLEX): HIV 1&2 Ab, 4th Generation: NONREACTIVE

## 2018-05-22 LAB — HEPATITIS C ANTIBODY
Hepatitis C Ab: NONREACTIVE
SIGNAL TO CUT-OFF: 0.03 (ref <1.00)

## 2018-05-23 ENCOUNTER — Encounter: Payer: Self-pay | Admitting: Family Medicine

## 2018-06-26 ENCOUNTER — Encounter (INDEPENDENT_AMBULATORY_CARE_PROVIDER_SITE_OTHER): Payer: Self-pay | Admitting: Physical Medicine and Rehabilitation

## 2018-06-26 ENCOUNTER — Telehealth (INDEPENDENT_AMBULATORY_CARE_PROVIDER_SITE_OTHER): Payer: Self-pay | Admitting: Physical Medicine and Rehabilitation

## 2018-06-26 ENCOUNTER — Ambulatory Visit (INDEPENDENT_AMBULATORY_CARE_PROVIDER_SITE_OTHER): Payer: BLUE CROSS/BLUE SHIELD | Admitting: Physical Medicine and Rehabilitation

## 2018-06-26 VITALS — BP 176/114 | HR 85 | Ht 73.0 in | Wt 240.0 lb

## 2018-06-26 DIAGNOSIS — M5116 Intervertebral disc disorders with radiculopathy, lumbar region: Secondary | ICD-10-CM | POA: Diagnosis not present

## 2018-06-26 DIAGNOSIS — M545 Low back pain, unspecified: Secondary | ICD-10-CM

## 2018-06-26 DIAGNOSIS — M48061 Spinal stenosis, lumbar region without neurogenic claudication: Secondary | ICD-10-CM | POA: Diagnosis not present

## 2018-06-26 DIAGNOSIS — M47816 Spondylosis without myelopathy or radiculopathy, lumbar region: Secondary | ICD-10-CM

## 2018-06-26 DIAGNOSIS — G8929 Other chronic pain: Secondary | ICD-10-CM

## 2018-06-26 NOTE — Progress Notes (Signed)
  Numeric Pain Rating Scale and Functional Assessment Average Pain 7 Pain Right Now 7 My pain is intermittent and dull Pain is worse with: standing and lying on left side Pain improves with: heat/ice   In the last MONTH (on 0-10 scale) has pain interfered with the following?  1. General activity like being  able to carry out your everyday physical activities such as walking, climbing stairs, carrying groceries, or moving a chair?  Rating(4)  2. Relation with others like being able to carry out your usual social activities and roles such as  activities at home, at work and in your community. Rating(4)  3. Enjoyment of life such that you have  been bothered by emotional problems such as feeling anxious, depressed or irritable?  Rating(3)

## 2018-06-26 NOTE — Telephone Encounter (Signed)
Spoke to [facility] and procedure code [phone] and [phone] does not require prior auth.  Ref# [id number]

## 2018-07-02 ENCOUNTER — Ambulatory Visit (INDEPENDENT_AMBULATORY_CARE_PROVIDER_SITE_OTHER): Payer: BLUE CROSS/BLUE SHIELD | Admitting: Physical Medicine and Rehabilitation

## 2018-07-02 ENCOUNTER — Ambulatory Visit (INDEPENDENT_AMBULATORY_CARE_PROVIDER_SITE_OTHER): Payer: Self-pay

## 2018-07-02 ENCOUNTER — Encounter (INDEPENDENT_AMBULATORY_CARE_PROVIDER_SITE_OTHER): Payer: Self-pay | Admitting: Physical Medicine and Rehabilitation

## 2018-07-02 VITALS — BP 154/97 | HR 58 | Temp 97.5°F

## 2018-07-02 DIAGNOSIS — M5416 Radiculopathy, lumbar region: Secondary | ICD-10-CM

## 2018-07-02 MED ORDER — BETAMETHASONE SOD PHOS & ACET 6 (3-3) MG/ML IJ SUSP
12.0000 mg | Freq: Once | INTRAMUSCULAR | Status: AC
  Administered 2018-07-02: 12 mg

## 2018-07-02 NOTE — Patient Instructions (Signed)

## 2018-07-02 NOTE — Progress Notes (Signed)
 .  Numeric Pain Rating Scale and Functional Assessment Average Pain 6   In the last MONTH (on 0-10 scale) has pain interfered with the following?  1. General activity like being  able to carry out your everyday physical activities such as walking, climbing stairs, carrying groceries, or moving a chair?  Rating(4)   +Driver, -BT, -Dye Allergies.  

## 2018-07-03 ENCOUNTER — Other Ambulatory Visit: Payer: Self-pay | Admitting: Cardiovascular Disease

## 2018-07-07 ENCOUNTER — Other Ambulatory Visit: Payer: Self-pay | Admitting: Physician Assistant

## 2018-07-07 DIAGNOSIS — I10 Essential (primary) hypertension: Secondary | ICD-10-CM

## 2018-07-08 NOTE — Telephone Encounter (Signed)
Rx request sent to pharmacy.  

## 2018-07-09 ENCOUNTER — Encounter (INDEPENDENT_AMBULATORY_CARE_PROVIDER_SITE_OTHER): Payer: Self-pay | Admitting: Physical Medicine and Rehabilitation

## 2018-07-09 NOTE — Progress Notes (Signed)
Jacob Quinn - 62 y.o. male MRN [id number]  Date of birth: 04/11/1956  Office Visit Note: Visit Date: 06/26/2018 PCP: Dorothyann Peng, NP Referred by: Dorothyann Peng, NP  Subjective: Chief Complaint  Patient presents with  . Lower Back - Pain   HPI: Jacob Quinn is a very pleasant 62 year old gentleman followed by Dr. Anderson Malta in our office in whom I saw last year for transforaminal epidural steroid injection.  He comes in today with chronic worsening left-sided low back pain that radiates into the buttock area but not really down the leg.  He is really not having numbness or tingling or paresthesias.  He reports over the last month this is gotten worse without any specific injury.  He does have pain with laying on the left side and standing for extended periods.  He does report that this is intermittent based on activity level and position.  He denies any right-sided complaints.  Historically his pain has been left-sided.  He has MRI from 2016 that shows moderate stenosis multifactorial at L3-4 with spondylitic change and disc bulge with small herniation on the left at L4-5.  He rates his pain as a 7 out of 10.  Since I have seen him last he has had some medical issues and he has had both knees revised by Dr. Frederik Pear.  He was also hospitalized with sepsis from urinary tract infection.  His medical history is also complicated by a significant cardiac history with myocardial infarction as well as history of current use of anticoagulation which is Brilinta.  He denies any focal weakness or new injury.  He said no fevers chills or night sweats or unexplained weight loss.  He continues to use some oxycodone for pain relief.  He cannot take anti-inflammatories.  He has not done well with certain muscle relaxers including tizanidine.   Review of Systems  Constitutional: Negative for chills, fever, malaise/fatigue and weight loss.  HENT: Negative for hearing loss and sinus pain.   Eyes:  Negative for blurred vision, double vision and photophobia.  Respiratory: Negative for cough and shortness of breath.   Cardiovascular: Negative for chest pain, palpitations and leg swelling.  Gastrointestinal: Negative for abdominal pain, nausea and vomiting.  Genitourinary: Negative for flank pain.  Musculoskeletal: Positive for back pain and joint pain. Negative for myalgias.  Skin: Negative for itching and rash.  Neurological: Negative for tremors, focal weakness and weakness.  Endo/Heme/Allergies: Negative.   Psychiatric/Behavioral: Negative for depression.  All other systems reviewed and are negative.  Otherwise per HPI.  Assessment & Plan: Visit Diagnoses:  1. Spinal stenosis of lumbar region without neurogenic claudication   2. Radiculopathy due to lumbar intervertebral disc disorder   3. Chronic left-sided low back pain without sciatica   4. Spondylosis without myelopathy or radiculopathy, lumbar region     Plan: Findings:  Chronic worsening left-sided axial low back pain some referral in the buttock which still seems to be clinically related to the lumbar stenosis at L3-4 possible continued disc herniation L4-5.  We had a long discussion about natural history of disc herniations versus bulging.  MRI was from 2016.  He has no real red flag complaints and no imaging was really done today.  Medically he is somewhat complicated with history of anticoagulation and cardiac issues and with both knees being revised recently.  I think the best approach is to complete diagnostic left L3 and L4 transforaminal epidural steroid injection.  This seemed of helped him in  the past.  We can do this while he is on the [place].  We discussed the risk of anticoagulation and epidural hematoma.  He will continue with current medications.  Depending on relief would look at either updating imaging of the lumbar spine versus regrouping with a physical therapist or repeat injection.  Would consider facet joint  block as well.    Meds & Orders: No orders of the defined types were placed in this encounter.  No orders of the defined types were placed in this encounter.   Follow-up: Return for left L3 and L4 transforaminal injection.   Procedures: No procedures performed  No notes on file   Clinical History: Lumbar Spine MRI 06/03/2015 [facility]  L3-4 bilateral facet arthropathy with moderate central canal stenosis and right more than left lateral recess narrowing.  L4-5 bilateral facet arthropathy and severe left foraminal narrowing.  Mild central canal stenosis.  Disc herniation more left-sided.   He reports that he has never smoked. He has never used smokeless tobacco.  Recent Labs    04/22/18 1207  HGBA1C 5.7*    Objective:  VS:  HT:6\' 1"  (185.4 cm)   WT:240 lb (108.9 kg)  BMI:31.67    BP:(!) 176/114  HR:85bpm  TEMP: ( )  RESP:  Physical Exam  Constitutional: He is oriented to person, place, and time. He appears well-developed and well-nourished. No distress.  HENT:  Head: Normocephalic and atraumatic.  Nose: Nose normal.  Mouth/Throat: Oropharynx is clear and moist.  Eyes: Pupils are equal, round, and reactive to light. Conjunctivae are normal.  Neck: Normal range of motion. Neck supple. No tracheal deviation present.  Cardiovascular: Regular rhythm and intact distal pulses.  Pulmonary/Chest: Effort normal and breath sounds normal.  Abdominal: Soft. He exhibits no distension. There is no rebound and no guarding.  Musculoskeletal: He exhibits no deformity.  Patient ambulates without aid.  He has pain with full extension and facet joint loading left more than right.  He has no pain over the PSIS but he does have some pain over the left greater trochanter.  He has no pain with hip rotation no groin pain.  He has good distal strength without deficit.  No clonus.  Neurological: He is alert and oriented to person, place, and time. He exhibits normal muscle tone. Coordination normal.    Skin: Skin is warm. No rash noted.  Psychiatric: He has a normal mood and affect. His behavior is normal.  Nursing note and vitals reviewed.   Ortho Exam Imaging: No results found.  Past Medical/Family/Surgical/Social History: Medications & Allergies reviewed per EMR, new medications updated. Patient Active Problem List   Diagnosis Date Noted  . Syncope and collapse 05/01/2018  . Primary osteoarthritis of right knee 04/22/2018  . Pain due to unicompartmental arthroplasty of knee ([place]) 04/19/2018  . Post op infection 11/22/2017  . Primary osteoarthritis of left knee 11/19/2017  . 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 Presence of left artificial knee joint 12/20/2016  . Presence of right artificial knee joint 12/20/2016  . Essential hypertension 08/08/2016  . Cardiomyopathy, ischemic 08/08/2016  . History of NSTEMI 08/07/2016  . Obesity (BMI 30-39.9) 08/07/2016  . Dyslipidemia, goal LDL below 70 08/07/2016  . CAD S/P percutaneous coronary angioplasty 08/07/2016  . Restless leg syndrome 09/16/2010  . Osteoarthritis 11/22/2007   Past Medical History:  Diagnosis Date  . 1st degree AV block   . BPH associated with nocturia   . CAD in native artery  a. NSTEMI 07/2016 -  LHC 08/07/16 showing 70% D1, 20% mLAD, segmental 95% then 75% OM stenosis, 85% acute marg, normal LVEDP - received PTCA/DES to OM.  Marland Kitchen Encephalitis, viral   . Hyperlipidemia 08/07/2016  . Hypertension   . Ischemic cardiomyopathy    a. LHC 07/2016 - mild mid anterolateral focal hypocontractility felt to be due to the circumflex stenosis, EF 50%.  . OA (osteoarthritis)   . Obesity (BMI 30-39.9) 08/07/2016  . Renal insufficiency    a. 2016 r/t dehydration/ACEI use.  Marland Kitchen Restless leg syndrome 09/16/2010  . Rheumatic fever   . Syncope 2009   associated with viral URI   Family History  Problem Relation Age of Onset  . Hypertension Mother   . Heart disease Mother   . Prostate cancer Father   .  Dementia Father   . Depression Brother   . Hypertension Brother   . Hypertension Brother   . Stroke Brother    Past Surgical History:  Procedure Laterality Date  . CARDIAC CATHETERIZATION N/A 08/07/2016   Procedure: Left Heart Cath and Coronary Angiography;  Surgeon: Troy Sine, MD;  Location: [facility];  Service: Cardiovascular;  Laterality: N/A;  . CARDIAC CATHETERIZATION N/A 08/07/2016   Procedure: Coronary Stent Intervention;  Surgeon: Troy Sine, MD;  Location: [facility];  Service: Cardiovascular;  Laterality: N/A;  . REPLACEMENT TOTAL KNEE     left and right-  5 total   . TONSILLECTOMY    . TOTAL KNEE REVISION Left 11/19/2017   Procedure: LEFT TOTAL KNEE REVISION;  Surgeon: Frederik Pear, MD;  Location: [facility];  Service: Orthopedics;  Laterality: Left;  . TOTAL KNEE REVISION Right 04/22/2018   Procedure: RIGHT TOTAL KNEE REVISION;  Surgeon: Frederik Pear, MD;  Location: [facility];  Service: Orthopedics;  Laterality: Right;  block   Social History   Occupational History  . Occupation: English as a second language teacher: [employer]    Comment: [name]  Tobacco Use  . Smoking status: Never Smoker  . Smokeless tobacco: Never Used  Substance and Sexual Activity  . Alcohol use: No  . Drug use: No  . Sexual activity: Yes

## 2018-07-18 NOTE — Progress Notes (Signed)
Jacob Quinn - 62 y.o. male MRN [id number]  Date of birth: 1956/01/20  Office Visit Note: Visit Date: 07/02/2018 PCP: Dorothyann Peng, NP Referred by: Dorothyann Peng, NP  Subjective: Chief Complaint  Patient presents with  . Lower Back - Pain   HPI: Jacob Quinn is a 62 year old gentleman that comes in today for planned left L3 and L4 transforaminal epidural steroid injection.  Please see our prior evaluation and management note for further details and justification.   ROS Otherwise per HPI.  Assessment & Plan: Visit Diagnoses:  1. Lumbar radiculopathy     Plan: No additional findings.   Meds & Orders:  Meds ordered this encounter  Medications  . betamethasone acetate-betamethasone sodium phosphate (CELESTONE) injection 12 mg    Orders Placed This Encounter  Procedures  . XR C-ARM NO REPORT  . Epidural Steroid injection    Follow-up: Return if symptoms worsen or fail to improve.   Procedures: No procedures performed  Lumbosacral Transforaminal Epidural Steroid Injection - Sub-Pedicular Approach with Fluoroscopic Guidance  Patient: Jacob Quinn      Date of Birth: 1956-10-02 MRN: [id number] PCP: Dorothyann Peng, NP      Visit Date: 07/02/2018   Universal Protocol:    Date/Time: 07/02/2018  Consent Given By: the patient  Position: PRONE  Additional Comments: Vital signs were monitored before and after the procedure. Patient was prepped and draped in the usual sterile fashion. The correct patient, procedure, and site was verified.   Injection Procedure Details:  Procedure Site One Meds Administered:  Meds ordered this encounter  Medications  . betamethasone acetate-betamethasone sodium phosphate (CELESTONE) injection 12 mg    Laterality: Left  Location/Site:  L3-L4 L4-L5  Needle size: 22 G  Needle type: Spinal  Needle Placement: Transforaminal  Findings:    -Comments: Excellent flow of contrast along the nerve and into the epidural  space.  Procedure Details: After squaring off the end-plates to get a true AP view, the C-arm was positioned so that an oblique view of the foramen as noted above was visualized. The target area is just inferior to the "nose of the scotty dog" or sub pedicular. The soft tissues overlying this structure were infiltrated with 2-3 ml. of 1% Lidocaine without Epinephrine.  The spinal needle was inserted toward the target using a "trajectory" view along the fluoroscope beam.  Under AP and lateral visualization, the needle was advanced so it did not puncture dura and was located close the 6 O'Clock position of the pedical in AP tracterory. Biplanar projections were used to confirm position. Aspiration was confirmed to be negative for CSF and/or blood. A 1-2 ml. volume of Isovue-250 was injected and flow of contrast was noted at each level. Radiographs were obtained for documentation purposes.   After attaining the desired flow of contrast documented above, a 0.5 to 1.0 ml test dose of 0.25% Marcaine was injected into each respective transforaminal space.  The patient was observed for 90 seconds post injection.  After no sensory deficits were reported, and normal lower extremity motor function was noted,   the above injectate was administered so that equal amounts of the injectate were placed at each foramen (level) into the transforaminal epidural space.   Additional Comments:  The patient tolerated the procedure well Dressing: Band-Aid    Post-procedure details: Patient was observed during the procedure. Post-procedure instructions were reviewed.  Patient left the clinic in stable condition.    Clinical History: Lumbar Spine MRI 06/03/2015 [facility]  L3-4  bilateral facet arthropathy with moderate central canal stenosis and right more than left lateral recess narrowing.  L4-5 bilateral facet arthropathy and severe left foraminal narrowing.  Mild central canal stenosis.  Disc herniation more  left-sided.   He reports that he has never smoked. He has never used smokeless tobacco.  Recent Labs    04/22/18 1207  HGBA1C 5.7*    Objective:  VS:  HT:    WT:   BMI:     BP:(!) 154/97  HR:(!) 58bpm  TEMP:(!) 97.5 F (36.4 C)(Oral)  RESP:  Physical Exam  Ortho Exam Imaging: No results found.  Past Medical/Family/Surgical/Social History: Medications & Allergies reviewed per EMR, new medications updated. Patient Active Problem List   Diagnosis Date Noted  . Syncope and collapse 05/01/2018  . Primary osteoarthritis of right knee 04/22/2018  . Pain due to unicompartmental arthroplasty of knee ([place]) 04/19/2018  . Post op infection 11/22/2017  . Primary osteoarthritis of left knee 11/19/2017  . 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 Presence of left artificial knee joint 12/20/2016  . Presence of right artificial knee joint 12/20/2016  . Essential hypertension 08/08/2016  . Cardiomyopathy, ischemic 08/08/2016  . History of NSTEMI 08/07/2016  . Obesity (BMI 30-39.9) 08/07/2016  . Dyslipidemia, goal LDL below 70 08/07/2016  . CAD S/P percutaneous coronary angioplasty 08/07/2016  . Restless leg syndrome 09/16/2010  . Osteoarthritis 11/22/2007   Past Medical History:  Diagnosis Date  . 1st degree AV block   . BPH associated with nocturia   . CAD in native artery    a. NSTEMI 07/2016 -  LHC 08/07/16 showing 70% D1, 20% mLAD, segmental 95% then 75% OM stenosis, 85% acute marg, normal LVEDP - received PTCA/DES to OM.  Marland Kitchen Encephalitis, viral   . Hyperlipidemia 08/07/2016  . Hypertension   . Ischemic cardiomyopathy    a. LHC 07/2016 - mild mid anterolateral focal hypocontractility felt to be due to the circumflex stenosis, EF 50%.  . OA (osteoarthritis)   . Obesity (BMI 30-39.9) 08/07/2016  . Renal insufficiency    a. 2016 r/t dehydration/ACEI use.  Marland Kitchen Restless leg syndrome 09/16/2010  . Rheumatic fever   . Syncope 2009   associated with viral URI   Family  History  Problem Relation Age of Onset  . Hypertension Mother   . Heart disease Mother   . Prostate cancer Father   . Dementia Father   . Depression Brother   . Hypertension Brother   . Hypertension Brother   . Stroke Brother    Past Surgical History:  Procedure Laterality Date  . CARDIAC CATHETERIZATION N/A 08/07/2016   Procedure: Left Heart Cath and Coronary Angiography;  Surgeon: Troy Sine, MD;  Location: [facility];  Service: Cardiovascular;  Laterality: N/A;  . CARDIAC CATHETERIZATION N/A 08/07/2016   Procedure: Coronary Stent Intervention;  Surgeon: Troy Sine, MD;  Location: [facility];  Service: Cardiovascular;  Laterality: N/A;  . REPLACEMENT TOTAL KNEE     left and right-  5 total   . TONSILLECTOMY    . TOTAL KNEE REVISION Left 11/19/2017   Procedure: LEFT TOTAL KNEE REVISION;  Surgeon: Frederik Pear, MD;  Location: [facility];  Service: Orthopedics;  Laterality: Left;  . TOTAL KNEE REVISION Right 04/22/2018   Procedure: RIGHT TOTAL KNEE REVISION;  Surgeon: Frederik Pear, MD;  Location: [facility];  Service: Orthopedics;  Laterality: Right;  block   Social History   Occupational History  . Occupation:  Textiles    Employer: [employer]    Comment: [name]  Tobacco Use  . Smoking status: Never Smoker  . Smokeless tobacco: Never Used  Substance and Sexual Activity  . Alcohol use: No  . Drug use: No  . Sexual activity: Yes

## 2018-07-18 NOTE — Procedures (Signed)
Lumbosacral Transforaminal Epidural Steroid Injection - Sub-Pedicular Approach with Fluoroscopic Guidance  Patient: Jacob Quinn      Date of Birth: July 01, 1956 MRN: [id number] PCP: Dorothyann Peng, NP      Visit Date: 07/02/2018   Universal Protocol:    Date/Time: 07/02/2018  Consent Given By: the patient  Position: PRONE  Additional Comments: Vital signs were monitored before and after the procedure. Patient was prepped and draped in the usual sterile fashion. The correct patient, procedure, and site was verified.   Injection Procedure Details:  Procedure Site One Meds Administered:  Meds ordered this encounter  Medications  . betamethasone acetate-betamethasone sodium phosphate (CELESTONE) injection 12 mg    Laterality: Left  Location/Site:  L3-L4 L4-L5  Needle size: 22 G  Needle type: Spinal  Needle Placement: Transforaminal  Findings:    -Comments: Excellent flow of contrast along the nerve and into the epidural space.  Procedure Details: After squaring off the end-plates to get a true AP view, the C-arm was positioned so that an oblique view of the foramen as noted above was visualized. The target area is just inferior to the "nose of the scotty dog" or sub pedicular. The soft tissues overlying this structure were infiltrated with 2-3 ml. of 1% Lidocaine without Epinephrine.  The spinal needle was inserted toward the target using a "trajectory" view along the fluoroscope beam.  Under AP and lateral visualization, the needle was advanced so it did not puncture dura and was located close the 6 O'Clock position of the pedical in AP tracterory. Biplanar projections were used to confirm position. Aspiration was confirmed to be negative for CSF and/or blood. A 1-2 ml. volume of Isovue-250 was injected and flow of contrast was noted at each level. Radiographs were obtained for documentation purposes.   After attaining the desired flow of contrast documented above, a  0.5 to 1.0 ml test dose of 0.25% Marcaine was injected into each respective transforaminal space.  The patient was observed for 90 seconds post injection.  After no sensory deficits were reported, and normal lower extremity motor function was noted,   the above injectate was administered so that equal amounts of the injectate were placed at each foramen (level) into the transforaminal epidural space.   Additional Comments:  The patient tolerated the procedure well Dressing: Band-Aid    Post-procedure details: Patient was observed during the procedure. Post-procedure instructions were reviewed.  Patient left the clinic in stable condition.

## 2018-08-11 ENCOUNTER — Other Ambulatory Visit: Payer: Self-pay | Admitting: Adult Health

## 2018-08-11 DIAGNOSIS — D696 Thrombocytopenia, unspecified: Secondary | ICD-10-CM

## 2018-08-12 ENCOUNTER — Ambulatory Visit: Payer: BLUE CROSS/BLUE SHIELD | Admitting: Cardiovascular Disease

## 2018-08-13 NOTE — Telephone Encounter (Signed)
No CPX since 01/2017.  Please advise.

## 2018-08-13 NOTE — Telephone Encounter (Signed)
Sent to the pharmacy by e-scribe for 30 days. 

## 2018-08-13 NOTE — Telephone Encounter (Signed)
Ok to refill for 30 days but needs CPE

## 2018-08-25 ENCOUNTER — Telehealth: Payer: Self-pay | Admitting: Pharmacist Clinician (PhC)/ Clinical Pharmacy Specialist

## 2018-08-25 DIAGNOSIS — E785 Hyperlipidemia, unspecified: Secondary | ICD-10-CM

## 2018-08-25 NOTE — Telephone Encounter (Signed)
Labs for Repatha PA

## 2018-08-26 ENCOUNTER — Ambulatory Visit: Payer: BLUE CROSS/BLUE SHIELD | Admitting: Cardiology

## 2018-09-03 ENCOUNTER — Ambulatory Visit (INDEPENDENT_AMBULATORY_CARE_PROVIDER_SITE_OTHER): Payer: BLUE CROSS/BLUE SHIELD | Admitting: Cardiology

## 2018-09-03 ENCOUNTER — Encounter: Payer: Self-pay | Admitting: Cardiology

## 2018-09-03 DIAGNOSIS — I10 Essential (primary) hypertension: Secondary | ICD-10-CM | POA: Diagnosis not present

## 2018-09-03 DIAGNOSIS — I255 Ischemic cardiomyopathy: Secondary | ICD-10-CM

## 2018-09-03 DIAGNOSIS — I2510 Atherosclerotic heart disease of native coronary artery without angina pectoris: Secondary | ICD-10-CM

## 2018-09-03 DIAGNOSIS — E785 Hyperlipidemia, unspecified: Secondary | ICD-10-CM | POA: Diagnosis not present

## 2018-09-03 DIAGNOSIS — Z96659 Presence of unspecified artificial knee joint: Secondary | ICD-10-CM

## 2018-09-03 DIAGNOSIS — Z9861 Coronary angioplasty status: Secondary | ICD-10-CM

## 2018-09-03 DIAGNOSIS — T84018D Broken internal joint prosthesis, other site, subsequent encounter: Secondary | ICD-10-CM | POA: Diagnosis not present

## 2018-09-03 NOTE — Progress Notes (Signed)
09/03/2018 Jacob Quinn   [birth year]  [id number]  Primary Physician Dorothyann Peng, NP Primary Cardiologist: Dr Claiborne Billings  HPI:  62 y/o male followed by Dr Claiborne Billings with a history of CAD, s/p NSTEMI Oct 2017 treated with OM PCI and DES. Myoview Oct 2018 was low risk. He did have residual 70% Dx1 and 85% RV branch. EF at cath was 50%. EF by Myoview Oct 2018 was 46%. He had a revision (his 7th knee surgery) of Rt TKR on 04/22/18. 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 It was felt this was secondary to a combination of pain medication, hypertensive medications, and dehydration.  I saw him in follow up 05/01/18. I cut his Lisinopril back to 20 mg and changed the timing to Q HS. There was some confusion about his medications at that time as well.  An echo done 05/07/18 showed preserved LVF.  He was supposed to see Dr Claiborne Billings in Oct for follow up but was placed on my schedule today.  He has had no further syncope or near syncope.   For some reason he takes his medications mid day. His B/P in the office by me is 158/92.    Current Outpatient Medications  Medication Sig Dispense Refill  . amLODipine (NORVASC) 10 MG tablet TAKE 1 TABLET (10 MG TOTAL) BY MOUTH DAILY. 30 tablet 6  . aspirin 81 MG tablet Take 81 mg by mouth daily.    . Evolocumab (REPATHA SURECLICK) 383 MG/ML SOAJ Inject 140 mg into the skin every 14 (fourteen) days. 2 pen 12  . lisinopril (PRINIVIL,ZESTRIL) 20 MG tablet Take 1 tablet (20 mg total) by mouth daily. 90 tablet 2  . metoprolol succinate (TOPROL-XL) 25 MG 24 hr tablet Take 25 mg by mouth daily.    . Multiple Vitamin (MULTI VITAMIN DAILY) TABS Take 1 tablet by mouth daily.    . nitroGLYCERIN (NITROSTAT) 0.4 MG SL tablet Place 1 tablet (0.4 mg total) under the tongue every 5 (five) minutes as needed for chest pain. Up to 3 doses. 25 tablet 3  . oxyCODONE-acetaminophen (PERCOCET/ROXICET) 5-325 MG tablet Take 1 tablet by mouth every 4 (four) hours as needed for  severe pain. 30 tablet 0  . tamsulosin (FLOMAX) 0.4 MG CAPS capsule Take 1 capsule (0.4 mg total) by mouth daily. **NEEDS YEARLY PHYSICAL WITH CORY** 30 capsule 0  . ticagrelor (BRILINTA) 90 MG TABS tablet Take 1 tablet (90 mg total) by mouth every 12 (twelve) hours. 180 tablet 3   No current facility-administered medications for this visit.     Allergies  Allergen Reactions  . Penicillins Anaphylaxis    *Has tolerated cephalosporins in the past*  Has patient had a PCN reaction causing immediate rash, facial/tongue/throat swelling, SOB or lightheadedness with hypotension: Yes Has patient had a PCN reaction causing severe rash involving mucus membranes or skin necrosis: No Has patient had a PCN reaction that required hospitalization: No Has patient had a PCN reaction occurring within the last 10 years: No If all of the above answers are "NO", then may proceed with Cephalosporin use.   . Tizanidine Other (See Comments)    High fevers, shaking  . Latex Rash    Past Medical History:  Diagnosis Date  . 1st degree AV block   . BPH associated with nocturia   . CAD in native artery    a. NSTEMI 07/2016 -  LHC 08/07/16 showing 70% D1, 20% mLAD, segmental 95% then 75%  OM stenosis, 85% acute marg, normal LVEDP - received PTCA/DES to OM.  Marland Kitchen Encephalitis, viral   . Hyperlipidemia 08/07/2016  . Hypertension   . Ischemic cardiomyopathy    a. LHC 07/2016 - mild mid anterolateral focal hypocontractility felt to be due to the circumflex stenosis, EF 50%.  . OA (osteoarthritis)   . Obesity (BMI 30-39.9) 08/07/2016  . Renal insufficiency    a. 2016 r/t dehydration/ACEI use.  Marland Kitchen Restless leg syndrome 09/16/2010  . Rheumatic fever   . Syncope 2009   associated with viral URI    Social History   Socioeconomic History  . Marital status: Single    Spouse name: Not on file  . Number of children: 3  . Years of education: Not on file  . Highest education level: Not on file  Occupational History    . 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 Financial resource strain: Not on file  . Food insecurity:    Worry: Not on file    Inability: Not on file  . Transportation needs:    Medical: Not on file    Non-medical: Not on file  Tobacco Use  . Smoking status: Never Smoker  . Smokeless tobacco: Never Used  Substance and Sexual Activity  . Alcohol use: No  . Drug use: No  . Sexual activity: Yes  Lifestyle  . Physical activity:    Days per week: Not on file    Minutes per session: Not on file  . Stress: Not on file  Relationships  . Social connections:    Talks on phone: Not on file    Gets together: Not on file    Attends religious service: Not on file    Active member of club or organization: Not on file    Attends meetings of clubs or organizations: Not on file    Relationship status: Not on file  . Intimate partner violence:    Fear of current or ex partner: Not on file    Emotionally abused: Not on file    Physically abused: Not on file    Forced sexual activity: Not on file  Other Topics Concern  . Not on file  Social History Narrative   Lives with daughter   Is retired -      Family History  Problem Relation Age of Onset  . Hypertension Mother   . Heart disease Mother   . Prostate cancer Father   . Dementia Father   . Depression Brother   . Hypertension Brother   . Hypertension Brother   . Stroke Brother      Review of Systems: General: negative for chills, fever, night sweats or weight changes.  Cardiovascular: negative for chest pain, dyspnea on exertion, edema, orthopnea, palpitations, paroxysmal nocturnal dyspnea or shortness of breath Dermatological: negative for rash Respiratory: negative for cough or wheezing Urologic: negative for hematuria Abdominal: negative for nausea, vomiting, diarrhea, bright red blood per rectum, melena, or hematemesis Neurologic: negative for visual changes,  syncope, or dizziness All other systems reviewed and are otherwise negative except as noted above.    Blood pressure (!) 170/96, pulse 66, resp. rate 16, height 6\' 1"  (1.854 m), weight 254 lb (115.2 kg), SpO2 99 %.  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 We discussed the guideline recommendation for 120/80 or less. If his pressure in not better controlled in the next week or so I think we should add HCTZ to his Lisinopril. He'll let us know. We discussed low salt diet.  F/U with Dr Claiborne Billings in two months.   Kerin Ransom PA-C 09/03/2018 11:22 AM

## 2018-09-03 NOTE — Assessment & Plan Note (Signed)
Labile hypertension

## 2018-09-03 NOTE — Patient Instructions (Signed)
Medication Instructions:  Your physician recommends that you continue on your current medications as directed. Please refer to the Current Medication list given to you today.  If you need a refill on your cardiac medications before your next appointment, please call your pharmacy.   Lab work: None  If you have labs (blood work) drawn today and your tests are completely normal, you will receive your results only by: Marland Kitchen MyChart Message (if you have MyChart) OR . A paper copy in the mail If you have any lab test that is abnormal or we need to change your treatment, we will call you to review the results.  Testing/Procedures: None   Follow-Up: At [facility], you and your health needs are our priority.  As part of our continuing mission to provide you with exceptional heart care, we have created designated Provider Care Teams.  These Care Teams include your primary Cardiologist (physician) and Advanced Practice Providers (APPs -  Physician Assistants and Nurse Practitioners) who all work together to provide you with the care you need, when you need it. . Your physician recommends that you schedule a follow-up appointment in: FOLLOW UP WITH DR Claiborne Billings AT THE FIRST OF THE YEAR   Any Other Special Instructions Will Be Listed Below (If Applicable). RECORD YOUR BLOOD PRESSURE AND BRING READINGS TO YOUR NEXT APPT

## 2018-09-03 NOTE — Assessment & Plan Note (Signed)
He is on Evolocumab

## 2018-09-03 NOTE — Assessment & Plan Note (Signed)
S/P revision 04/22/18- Dr Mayer Camel

## 2018-09-03 NOTE — Assessment & Plan Note (Signed)
OM PCI with DES Oct 2017 Low risk Myoview Oct 2018

## 2018-09-03 NOTE — Assessment & Plan Note (Signed)
EF 46% by Myoview Oct 2017- EF 60-65% by echo Oct 2019

## 2018-09-13 IMAGING — DX DG CHEST 2V
2 series · 2 of 2 positions shown · non-contrast
Comparison: PA and lateral chest 01/30/2015 and 10/15/2007.

CLINICAL DATA: Burning in the chest for approximately 2 weeks.

EXAM:
CHEST  2 VIEW

[chest pa]
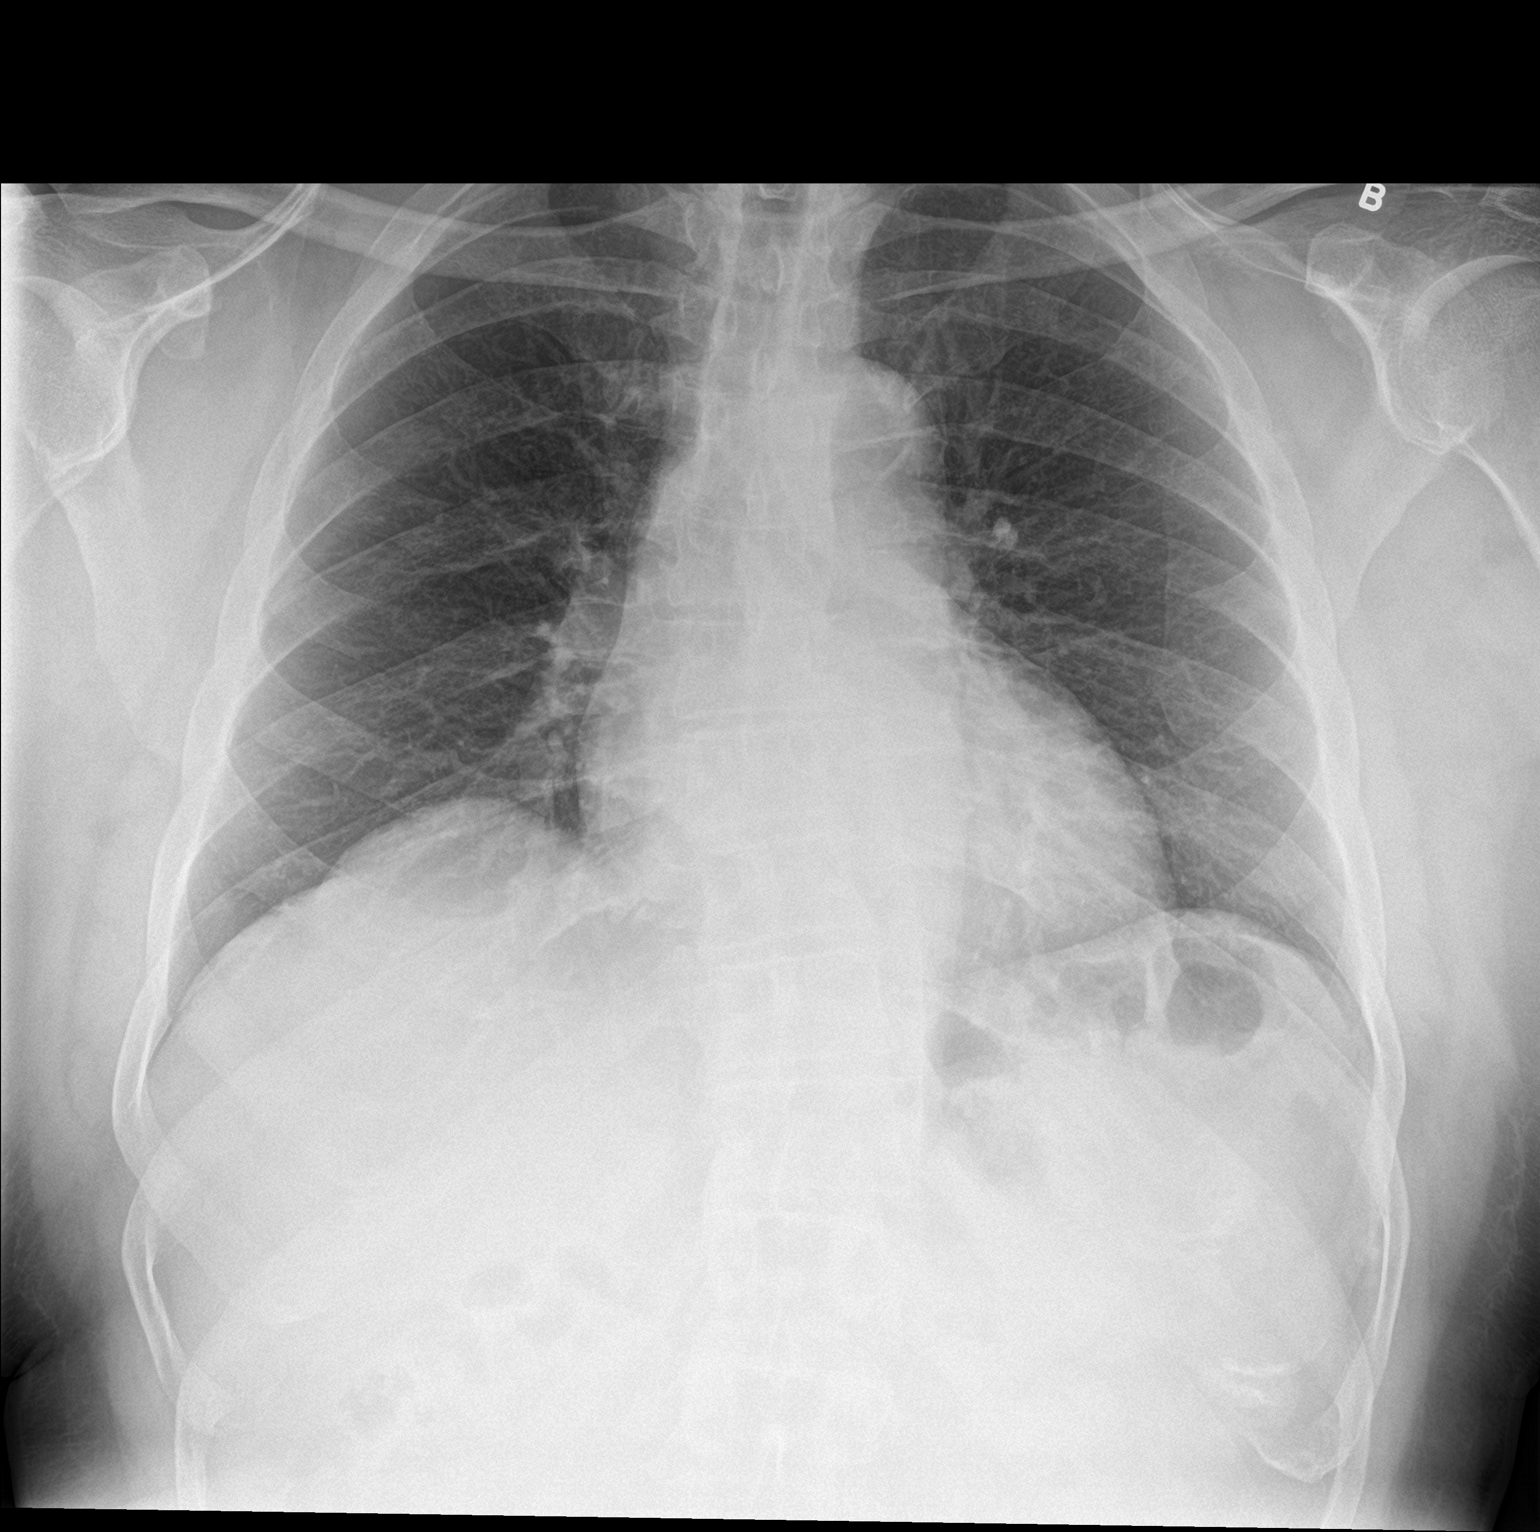

[chest lat]
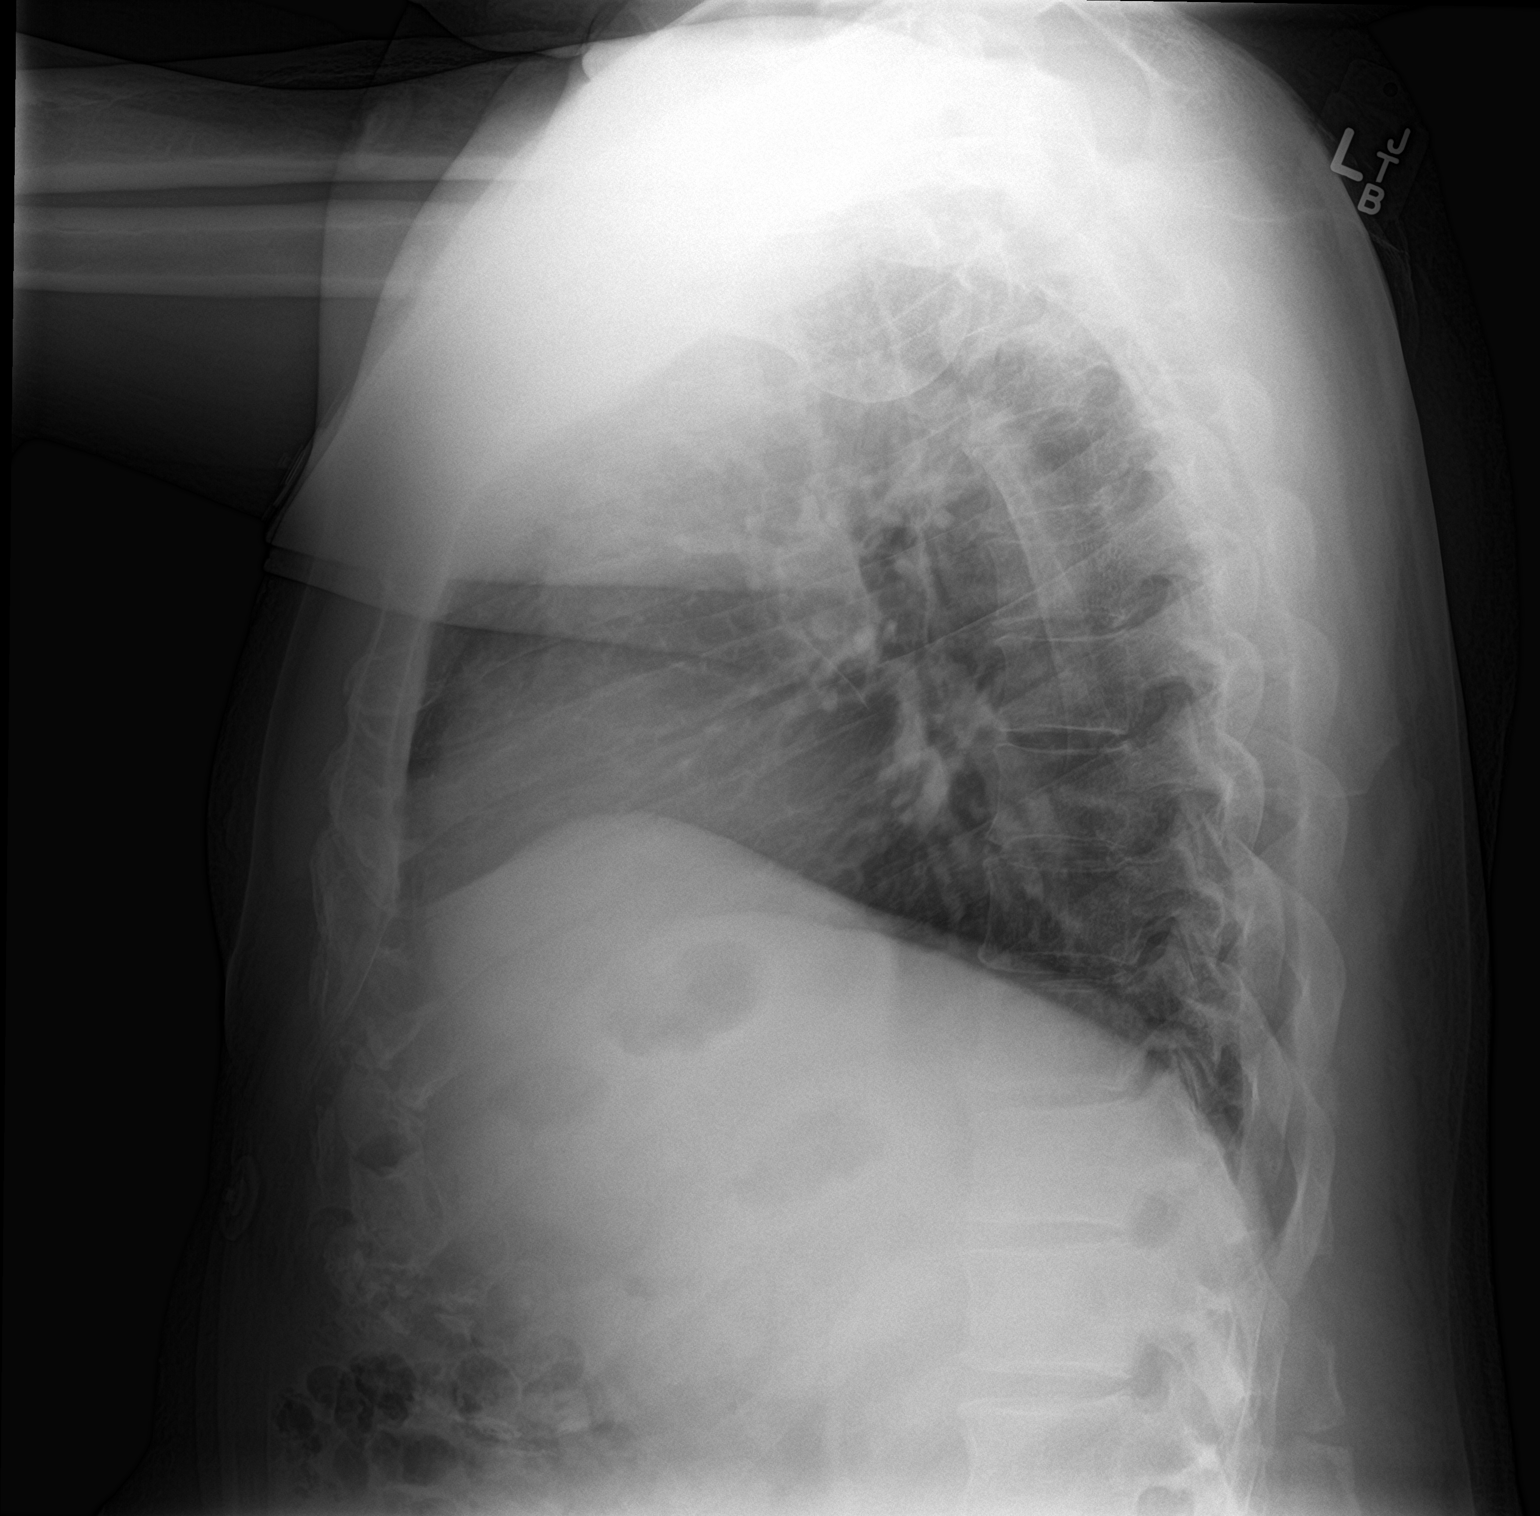

[2 of 2 positions shown; findings below may reference images not displayed]

FINDINGS: The lungs are clear. Heart size is upper normal. There is no
pneumothorax or pleural effusion. Aortic atherosclerosis is noted.
IMPRESSION: No acute disease.

Atherosclerosis.

## 2018-09-16 IMAGING — CR DG CHEST 2V
2 series · 2 of 2 positions shown · non-contrast
Comparison: 08/04/2016

CLINICAL DATA: Chest pain.

EXAM:
CHEST  2 VIEW

[chest pa]
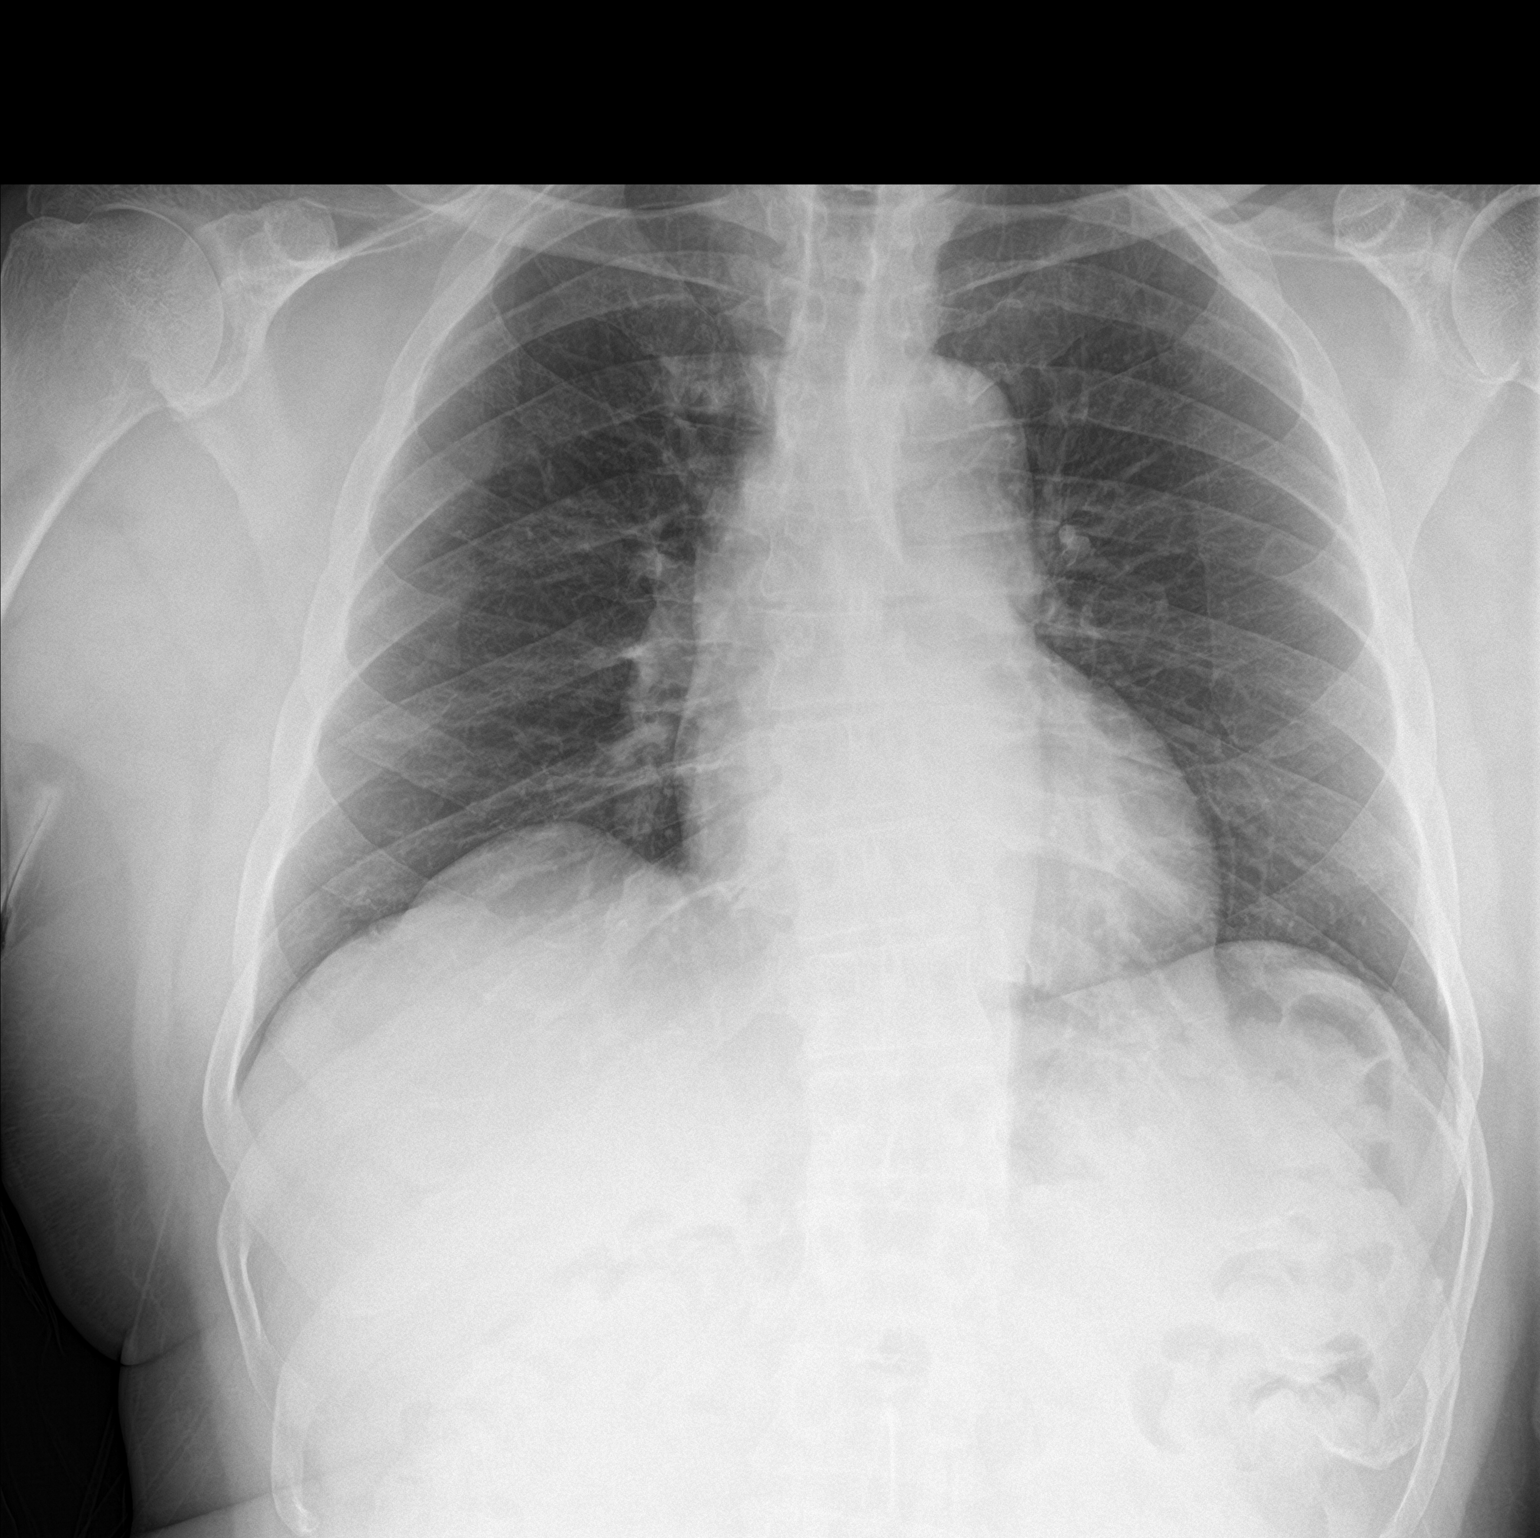

[chest lat]
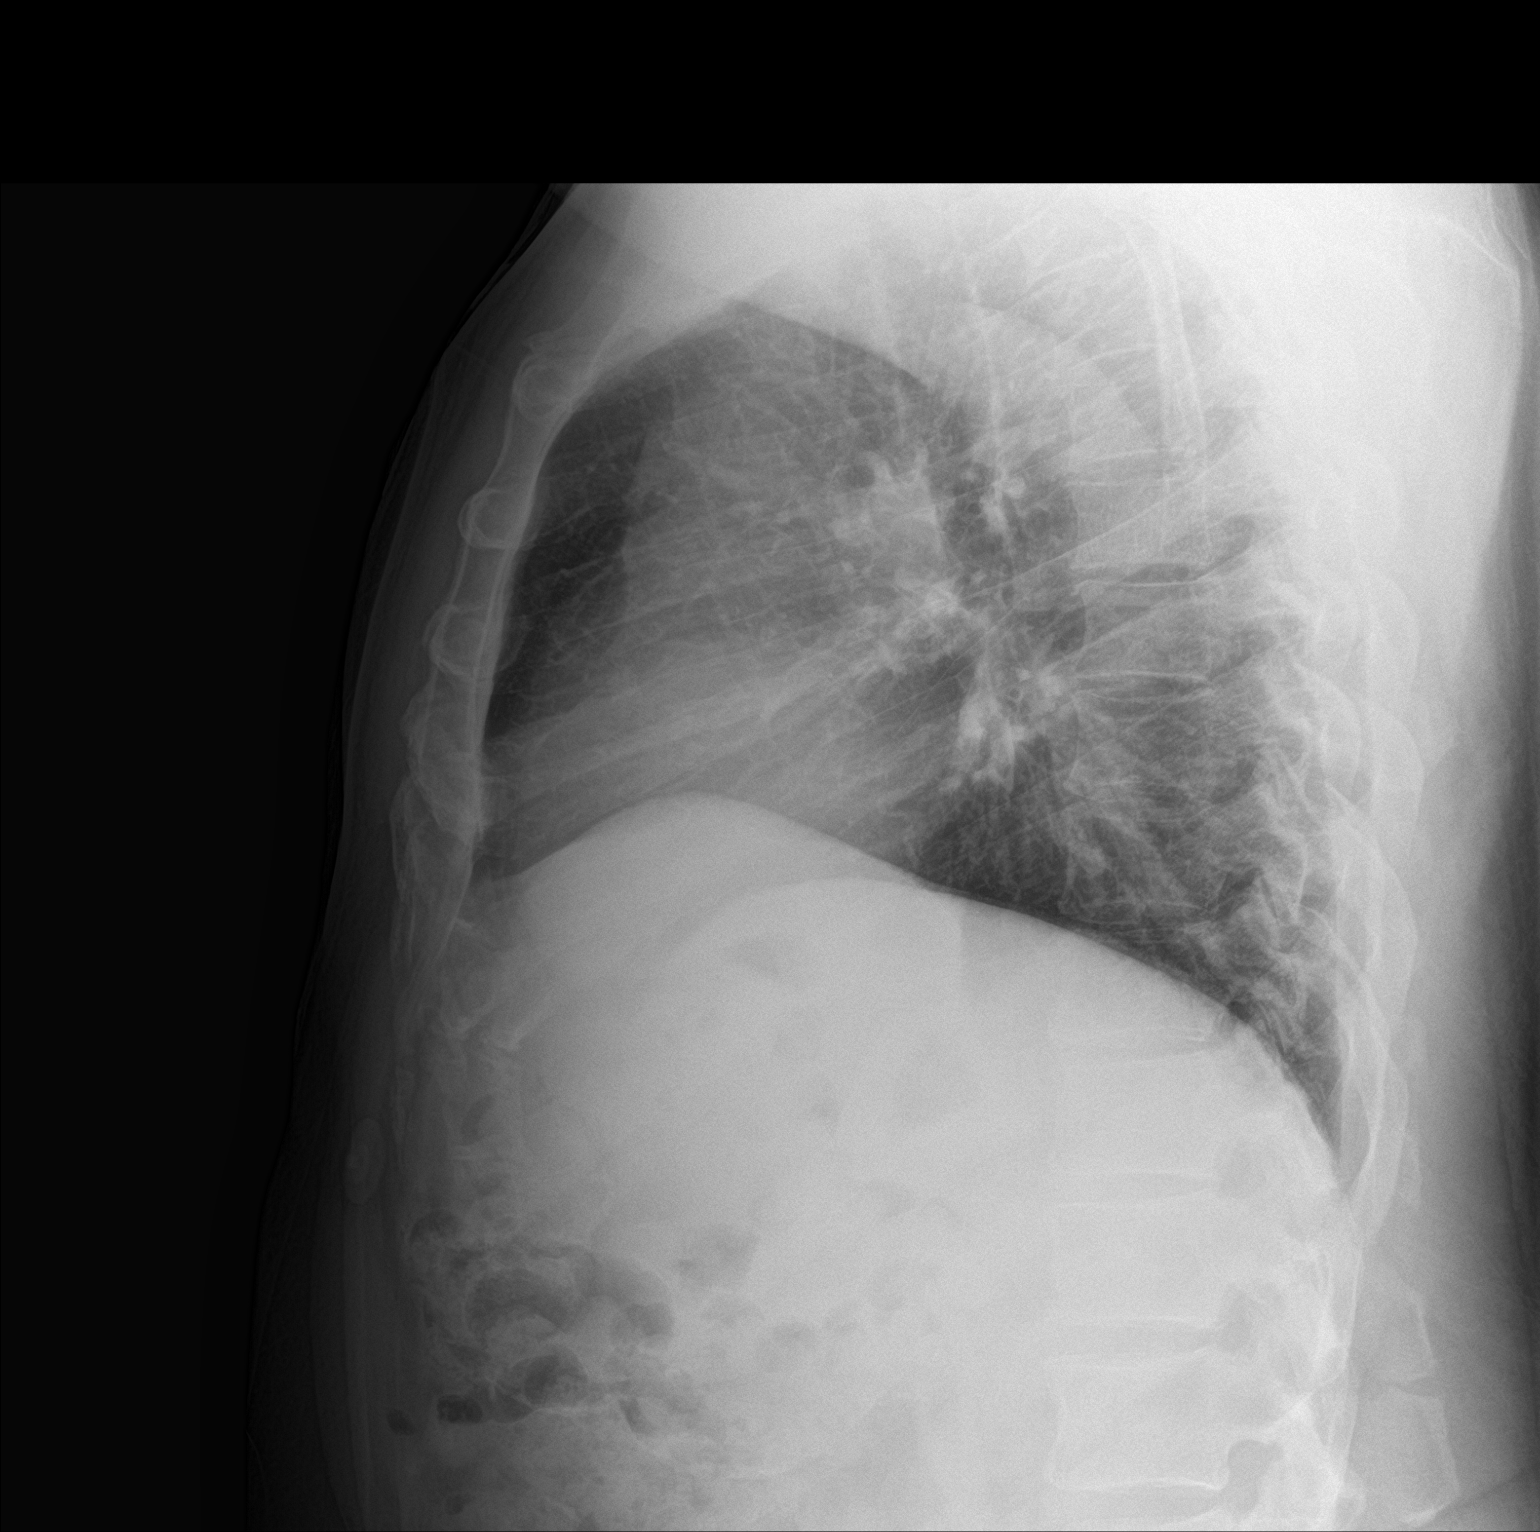

[2 of 2 positions shown; findings below may reference images not displayed]

FINDINGS: The cardiomediastinal contours are normal. Mild eventration of right
hemidiaphragm unchanged. The lungs are clear. Pulmonary vasculature
is normal. No consolidation, pleural effusion, or pneumothorax. No
acute osseous abnormalities are seen.
IMPRESSION: No active cardiopulmonary disease.

## 2018-09-29 ENCOUNTER — Other Ambulatory Visit: Payer: Self-pay | Admitting: Cardiovascular Disease

## 2018-09-30 ENCOUNTER — Telehealth: Payer: Self-pay

## 2018-09-30 NOTE — Telephone Encounter (Signed)
CALLED PT TO SCHEDULE LABS PT STATED THEY WOULD COME TOMORROW TO THE LAB FOR [place]

## 2018-10-09 ENCOUNTER — Telehealth: Payer: Self-pay

## 2018-10-09 NOTE — Telephone Encounter (Signed)
Called pt to schedule lipid labs for PA of Repatha pt was compliant and will come tomorrow to the lab

## 2018-10-10 ENCOUNTER — Telehealth: Payer: Self-pay | Admitting: Pharmacist Clinician (PhC)/ Clinical Pharmacy Specialist

## 2018-10-10 DIAGNOSIS — E785 Hyperlipidemia, unspecified: Secondary | ICD-10-CM

## 2018-10-10 LAB — LIPID PANEL
Chol/HDL Ratio: 3.1 ratio (ref 0.0–5.0)
Cholesterol, Total: 131 mg/dL (ref 100–199)
HDL: 42 mg/dL (ref >39)
LDL CALC: 71 mg/dL (ref 0–99)
TRIGLYCERIDES: 90 mg/dL (ref 0–149)
VLDL Cholesterol Cal: 18 mg/dL (ref 5–40)

## 2018-10-10 NOTE — Telephone Encounter (Signed)
Patient denies missed doses of Repatha.  LDL unchanged.  Will give 2 samples of Praluent 150 mg then repeat lipid labs.  If truly non-responder will have patient follow up with Dr. Debara Pickett

## 2018-10-10 NOTE — Telephone Encounter (Signed)
Disregard previous information.  LDL dropped with Repatha to 71.  Patient to continue Repatha.  We will send in PA request then forward rx to [place]

## 2018-10-12 ENCOUNTER — Other Ambulatory Visit: Payer: Self-pay | Admitting: Adult Health

## 2018-10-12 DIAGNOSIS — D696 Thrombocytopenia, unspecified: Secondary | ICD-10-CM

## 2018-10-14 ENCOUNTER — Encounter: Payer: Self-pay | Admitting: Family Medicine

## 2018-10-14 NOTE — Telephone Encounter (Signed)
#  [place].  PT PAST DUE FOR CPX.  LETTER MAILED.

## 2018-11-01 ENCOUNTER — Telehealth: Payer: Self-pay | Admitting: Pharmacist Clinician (PhC)/ Clinical Pharmacy Specialist

## 2018-11-01 MED ORDER — EZETIMIBE 10 MG PO TABS: 10.0000 mg | ORAL_TABLET | Freq: Every day | ORAL | 3 refills | Status: DC

## 2018-11-01 MED ORDER — ATORVASTATIN CALCIUM 80 MG PO TABS: 80.0000 mg | ORAL_TABLET | Freq: Every day | ORAL | 3 refills | Status: DC

## 2018-11-01 NOTE — Telephone Encounter (Signed)
Patient has been on [place] for much of 2019.  When he started with it, he discontinued atorvastatin 80 mg and ezetimibe 10 mg.  Insurance denied renewal for [place] for 2020 because patient no longer on maximally tolerated statin and ezetimibe.    Spoke with patient, he believes he still has these meds at home, but will send rx to pharmacy.  He will restart meds this week.

## 2018-11-17 ENCOUNTER — Other Ambulatory Visit: Payer: Self-pay | Admitting: Adult Health

## 2018-11-17 DIAGNOSIS — D696 Thrombocytopenia, unspecified: Secondary | ICD-10-CM

## 2018-12-20 ENCOUNTER — Other Ambulatory Visit: Payer: Self-pay | Admitting: Pharmacist Clinician (PhC)/ Clinical Pharmacy Specialist

## 2018-12-20 ENCOUNTER — Other Ambulatory Visit: Payer: Self-pay | Admitting: Cardiovascular Disease

## 2018-12-20 ENCOUNTER — Other Ambulatory Visit: Payer: Self-pay | Admitting: Adult Health

## 2018-12-20 DIAGNOSIS — D696 Thrombocytopenia, unspecified: Secondary | ICD-10-CM

## 2018-12-20 MED ORDER — EVOLOCUMAB 140 MG/ML ~~LOC~~ SOAJ: 140.0000 mg | SUBCUTANEOUS | 3 refills | Status: DC

## 2018-12-20 NOTE — Telephone Encounter (Signed)
Denied.  Past due for cpx and fasting lab work.

## 2018-12-20 NOTE — Telephone Encounter (Signed)
°*  STAT* If patient is at the pharmacy, call can be transferred to refill team.   1. Which medications need to be refilled? (please list name of each medication and dose if known) Repatha  2. Which pharmacy/location (including street and city if local pharmacy) is medication to be sent to? [facility] [phone]  3. Do they need a 30 day or 90 day supply? Injection

## 2018-12-23 ENCOUNTER — Ambulatory Visit: Payer: BLUE CROSS/BLUE SHIELD | Admitting: Cardiovascular Disease

## 2018-12-24 ENCOUNTER — Telehealth: Payer: Self-pay

## 2018-12-24 MED ORDER — EVOLOCUMAB 140 MG/ML ~~LOC~~ SOAJ: 140.0000 mg | SUBCUTANEOUS | 3 refills | Status: DC

## 2018-12-24 NOTE — Telephone Encounter (Signed)
Called and spoke with the pt regarding the approval of the repatha sureclick and sent rx to [place]

## 2018-12-26 ENCOUNTER — Other Ambulatory Visit: Payer: Self-pay | Admitting: Cardiology

## 2018-12-26 NOTE — Telephone Encounter (Signed)
Rx(s) sent to pharmacy electronically.  

## 2019-01-05 ENCOUNTER — Other Ambulatory Visit: Payer: Self-pay | Admitting: Cardiovascular Disease

## 2019-01-19 ENCOUNTER — Other Ambulatory Visit: Payer: Self-pay | Admitting: Adult Health

## 2019-01-19 DIAGNOSIS — D696 Thrombocytopenia, unspecified: Secondary | ICD-10-CM

## 2019-02-22 ENCOUNTER — Other Ambulatory Visit: Payer: Self-pay | Admitting: Cardiovascular Disease

## 2019-03-03 ENCOUNTER — Telehealth: Payer: Self-pay | Admitting: Cardiovascular Disease

## 2019-03-03 NOTE — Telephone Encounter (Signed)
Spoke to pt who stated he is not having any cardiac problems right now. He stated he wanted to cancel his appointment with Dr. Claiborne Billings and will call back to reschedule when he gets his insurance straightened out.  Appointment on 03/05/19 cancelled.

## 2019-03-05 ENCOUNTER — Ambulatory Visit: Payer: BLUE CROSS/BLUE SHIELD | Admitting: Cardiovascular Disease

## 2019-08-19 ENCOUNTER — Other Ambulatory Visit: Payer: Self-pay

## 2019-08-19 DIAGNOSIS — Z20822 Contact with and (suspected) exposure to covid-19: Secondary | ICD-10-CM

## 2019-08-20 LAB — NOVEL CORONAVIRUS, NAA: SARS-CoV-2, NAA: NOT DETECTED

## 2019-11-04 ENCOUNTER — Encounter: Payer: Self-pay | Admitting: Adult Health

## 2019-11-04 ENCOUNTER — Other Ambulatory Visit: Payer: Self-pay

## 2019-11-04 ENCOUNTER — Ambulatory Visit (INDEPENDENT_AMBULATORY_CARE_PROVIDER_SITE_OTHER): Payer: Medicare Other | Admitting: Adult Health

## 2019-11-04 VITALS — BP 140/86 | HR 84 | Temp 98.3°F | Ht 70.75 in | Wt 270.0 lb

## 2019-11-04 DIAGNOSIS — N401 Enlarged prostate with lower urinary tract symptoms: Secondary | ICD-10-CM

## 2019-11-04 DIAGNOSIS — Z9861 Coronary angioplasty status: Secondary | ICD-10-CM

## 2019-11-04 DIAGNOSIS — Z23 Encounter for immunization: Secondary | ICD-10-CM | POA: Diagnosis not present

## 2019-11-04 DIAGNOSIS — I2510 Atherosclerotic heart disease of native coronary artery without angina pectoris: Secondary | ICD-10-CM

## 2019-11-04 DIAGNOSIS — E669 Obesity, unspecified: Secondary | ICD-10-CM

## 2019-11-04 DIAGNOSIS — M1711 Unilateral primary osteoarthritis, right knee: Secondary | ICD-10-CM | POA: Diagnosis not present

## 2019-11-04 DIAGNOSIS — E785 Hyperlipidemia, unspecified: Secondary | ICD-10-CM

## 2019-11-04 DIAGNOSIS — Z0001 Encounter for general adult medical examination with abnormal findings: Secondary | ICD-10-CM

## 2019-11-04 DIAGNOSIS — I10 Essential (primary) hypertension: Secondary | ICD-10-CM

## 2019-11-04 DIAGNOSIS — Z1211 Encounter for screening for malignant neoplasm of colon: Secondary | ICD-10-CM

## 2019-11-04 DIAGNOSIS — R351 Nocturia: Secondary | ICD-10-CM

## 2019-11-04 MED ORDER — ATORVASTATIN CALCIUM 80 MG PO TABS: 80.0000 mg | ORAL_TABLET | Freq: Every day | ORAL | 2 refills | Status: DC

## 2019-11-04 MED ORDER — AMLODIPINE BESYLATE 10 MG PO TABS: 10.0000 mg | ORAL_TABLET | Freq: Every day | ORAL | 2 refills | Status: DC

## 2019-11-04 MED ORDER — LISINOPRIL 20 MG PO TABS: 20.0000 mg | ORAL_TABLET | Freq: Every day | ORAL | 2 refills | Status: DC

## 2019-11-04 MED ORDER — METOPROLOL SUCCINATE ER 25 MG PO TB24: 25.0000 mg | ORAL_TABLET | Freq: Every day | ORAL | 2 refills | Status: DC

## 2019-11-04 MED ORDER — TAMSULOSIN HCL 0.4 MG PO CAPS: ORAL_CAPSULE | ORAL | 3 refills | Status: DC

## 2019-11-04 MED ORDER — EZETIMIBE 10 MG PO TABS: 10.0000 mg | ORAL_TABLET | Freq: Every day | ORAL | 2 refills | Status: DC

## 2019-11-04 NOTE — Patient Instructions (Signed)
It was great seeing you today   Please call Cardiology and lipid clinic to get scheduled with them   Someone will call you to schedule your colonoscopy   I will send in your medications today   Make an appointment for lab visit as well as a visit to do a steroid injection into the left shoulder

## 2019-11-04 NOTE — Progress Notes (Signed)
Subjective:    Patient ID: Jacob Quinn, male    DOB: [birth year], 64 y.o.   MRN: [id number]  HPI  Patient presents for yearly preventative medicine examination. He is a pleasant 64 year old male who  has a past medical history of 1st degree AV block, BPH associated with nocturia, CAD in native artery, Encephalitis, viral, Hyperlipidemia (08/07/2016), Hypertension, Ischemic cardiomyopathy, OA (osteoarthritis), Obesity (BMI 30-39.9) (08/07/2016), Renal insufficiency, Restless leg syndrome (09/16/2010), Rheumatic fever, and Syncope (2009).   Over the last year he was seen at [facility] d/t insurance reasons. He has changed his insurance and is back with [place].   Unfortunately, he has not taken most of his medications at this time.  Essential Hypertension -currently prescribed Norvasc 10 mg, lisinopril 20 mg, and Toprol 25 mg.  He is taking Norvasc but is no longer taking lisinopril or metoprolol.  He denies dizziness, lightheadedness, chest pain, or shortness of breath BP Readings from Last 3 Encounters:  11/04/19 140/86  09/03/18 (!) 170/96  07/02/18 (!) 154/97   Dyslipidemia -this was managed by Dr. Claiborne Billings.  Was prescribed Zetia, Repatha, and Lipitor 80 mg.  Again he is no longer taking these medications. Lab Results  Component Value Date   CHOL 131 10/10/2018   HDL 42 10/10/2018   LDLCALC 71 10/10/2018   LDLDIRECT 110.4 02/19/2007   TRIG 90 10/10/2018   CHOLHDL 3.1 10/10/2018   CAD - s/p NSTEMI in October 2017.  He was treated with OM PCI and DES.  His Myoview in October 2018 was low risk.  He did have residual 70% DX 1 and 85% RV branch.  His EF at cath was 50%.  EF by Myoview in October 2018 was 46%.  He was on Brilinta 90 mg but was told by his PCP at [facility] that he no longer had to take this since it had been greater than a year.  He has continued his 81 mg aspirin  BPH -prescribed Flomax.  He does report that this helped improve his symptoms but since he has run out  his symptoms have returned.  Symptoms include nocturia decreased stream, incomplete bladder emptying.  Knee Pain -in January he had his left total knee revision and in June he had the right total knee revision done.  This was done at [facility].  He continues to have pain bilaterally.  He plans on following up with his orthopedist at [facility], Dr. Marlou Sa.  All immunizations and health maintenance protocols were reviewed with the patient and needed orders were placed. He is due for flu vaccination and Prevnar 13  Appropriate screening laboratory values were ordered for the patient including screening of hyperlipidemia, renal function and hepatic function. If indicated by BPH, a PSA was ordered.  Medication reconciliation,  past medical history, social history, problem list and allergies were reviewed in detail with the patient  Goals were established with regard to weight loss, exercise, and  diet in compliance with medications  End of life planning was discussed.  He is due for routine screening colonoscopy.  Review of Systems  Constitutional: Negative.   HENT: Negative.   Eyes: Negative.   Respiratory: Negative.   Cardiovascular: Negative.   Gastrointestinal: Negative.   Endocrine: Negative.   Genitourinary: Negative.   Musculoskeletal: Positive for arthralgias (bilateral knees and left shoulder).  Allergic/Immunologic: Negative.   Neurological: Negative.   Hematological: Negative.   Psychiatric/Behavioral: Negative.   All other systems reviewed and are negative.  Past  Medical History:  Diagnosis Date  . 1st degree AV block   . BPH associated with nocturia   . CAD in native artery    a. NSTEMI 07/2016 -  LHC 08/07/16 showing 70% D1, 20% mLAD, segmental 95% then 75% OM stenosis, 85% acute marg, normal LVEDP - received PTCA/DES to OM.  Marland Kitchen Encephalitis, viral   . Hyperlipidemia 08/07/2016  . Hypertension   . Ischemic cardiomyopathy    a. LHC 07/2016 - mild mid anterolateral  focal hypocontractility felt to be due to the circumflex stenosis, EF 50%.  . OA (osteoarthritis)   . Obesity (BMI 30-39.9) 08/07/2016  . Renal insufficiency    a. 2016 r/t dehydration/ACEI use.  Marland Kitchen Restless leg syndrome 09/16/2010  . Rheumatic fever   . Syncope 2009   associated with viral URI    Social History   Socioeconomic History  . Marital status: Single    Spouse name: Not on file  . Number of children: 3  . Years of education: Not on file  . Highest education level: Not on file  Occupational History  . Occupation: English as a second language teacher: [employer]    Comment: [name]  Tobacco Use  . Smoking status: Never Smoker  . Smokeless tobacco: Never Used  Substance and Sexual Activity  . Alcohol use: No  . Drug use: No  . Sexual activity: Yes  Other Topics Concern  . Not on file  Social History Narrative   Lives with daughter   Is retired -    Social Determinants of Radio broadcast assistant Strain:   . Difficulty of Paying Living Expenses: Not on file  Food Insecurity:   . Worried About Charity fundraiser in the Last Year: Not on file  . Ran Out of Food in the Last Year: Not on file  Transportation Needs:   . Lack of Transportation (Medical): Not on file  . Lack of Transportation (Non-Medical): Not on file  Physical Activity:   . Days of Exercise per Week: Not on file  . Minutes of Exercise per Session: Not on file  Stress:   . Feeling of Stress : Not on file  Social Connections:   . Frequency of Communication with Friends and Family: Not on file  . Frequency of Social Gatherings with Friends and Family: Not on file  . Attends Religious Services: Not on file  . Active Member of Clubs or Organizations: Not on file  . Attends Archivist Meetings: Not on file  . Marital Status: Not on file  Intimate Partner Violence:   . Fear of Current or Ex-Partner: Not on file  . Emotionally Abused: Not on file  . Physically Abused: Not on file    . Sexually Abused: Not on file    Past Surgical History:  Procedure Laterality Date  . CARDIAC CATHETERIZATION N/A 08/07/2016   Procedure: Left Heart Cath and Coronary Angiography;  Surgeon: Troy Sine, MD;  Location: [place] [facility];  Service: Cardiovascular;  Laterality: N/A;  . CARDIAC CATHETERIZATION N/A 08/07/2016   Procedure: Coronary Stent Intervention;  Surgeon: Troy Sine, MD;  Location: [place] [facility];  Service: Cardiovascular;  Laterality: N/A;  . REPLACEMENT TOTAL KNEE     left and right-  5 total   . TONSILLECTOMY    . TOTAL KNEE REVISION Left 11/19/2017   Procedure: LEFT TOTAL KNEE REVISION;  Surgeon: Frederik Pear, MD;  Location: [place];  Service: Orthopedics;  Laterality: Left;  . TOTAL KNEE REVISION Right 04/22/2018   Procedure: RIGHT TOTAL KNEE REVISION;  Surgeon: Frederik Pear, MD;  Location: [facility];  Service: Orthopedics;  Laterality: Right;  block    Family History  Problem Relation Age of Onset  . Hypertension Mother   . Heart disease Mother   . Prostate cancer Father   . Dementia Father   . Depression Brother   . Hypertension Brother   . Hypertension Brother   . Stroke Brother     Allergies  Allergen Reactions  . Penicillins Anaphylaxis    *Has tolerated cephalosporins in the past*  Has patient had a PCN reaction causing immediate rash, facial/tongue/throat swelling, SOB or lightheadedness with hypotension: Yes Has patient had a PCN reaction causing severe rash involving mucus membranes or skin necrosis: No Has patient had a PCN reaction that required hospitalization: No Has patient had a PCN reaction occurring within the last 10 years: No If all of the above answers are "NO", then may proceed with Cephalosporin use.   . Tizanidine Other (See Comments)    High fevers, shaking  . Latex Rash    Current Outpatient Medications on File Prior to Visit  Medication Sig Dispense Refill  . amLODipine (NORVASC) 10 MG tablet TAKE 1 TABLET (10 MG  TOTAL) BY MOUTH DAILY. 30 tablet 6  . aspirin 81 MG tablet Take 81 mg by mouth daily.    Marland Kitchen atorvastatin (LIPITOR) 80 MG tablet Take 1 tablet (80 mg total) by mouth daily. 90 tablet 3  . Evolocumab (REPATHA SURECLICK) XX123456 MG/ML SOAJ Inject 140 mg into the skin every 14 (fourteen) days. (Patient not taking: Reported on 11/04/2019) 6 pen 3  . ezetimibe (ZETIA) 10 MG tablet Take 1 tablet (10 mg total) by mouth daily. 90 tablet 3  . lisinopril (PRINIVIL,ZESTRIL) 20 MG tablet Take 1 tablet (20 mg total) by mouth daily. (Patient not taking: Reported on 11/04/2019) 90 tablet 2  . metoprolol succinate (TOPROL-XL) 25 MG 24 hr tablet TAKE 1 TABLET BY MOUTH EVERY DAY (Patient not taking: Reported on 11/04/2019) 90 tablet 1  . Multiple Vitamin (MULTI VITAMIN DAILY) TABS Take 1 tablet by mouth daily. (Patient not taking: Reported on 11/04/2019)    . nitroGLYCERIN (NITROSTAT) 0.4 MG SL tablet Place 1 tablet (0.4 mg total) under the tongue every 5 (five) minutes as needed for chest pain. Up to 3 doses. (Patient not taking: Reported on 11/04/2019) 25 tablet 3  . oxyCODONE-acetaminophen (PERCOCET/ROXICET) 5-325 MG tablet Take 1 tablet by mouth every 4 (four) hours as needed for severe pain. (Patient not taking: Reported on 11/04/2019) 30 tablet 0  . tamsulosin (FLOMAX) 0.4 MG CAPS capsule TAKE 1 CAPSULE (0.4 MG TOTAL) BY MOUTH DAILY (Patient not taking: Reported on 11/04/2019) 90 capsule 0  . ticagrelor (BRILINTA) 90 MG TABS tablet Take 1 tablet (90 mg total) by mouth every 12 (twelve) hours. (Patient not taking: Reported on 11/04/2019) 180 tablet 3   No current facility-administered medications on file prior to visit.    BP 140/86   Pulse 84   Temp 98.3 F (36.8 C) (Other (Comment))   Ht 5' 10.75" (1.797 m)   Wt 270 lb (122.5 kg)   SpO2 92%   BMI 37.92 kg/m       Objective:   Physical Exam Vitals and nursing note reviewed.  Constitutional:      Appearance: Normal appearance. He is obese.  HENT:     Head:  Normocephalic and atraumatic.  Right Ear: Tympanic membrane, ear canal and external ear normal. There is no impacted cerumen.     Left Ear: Tympanic membrane, ear canal and external ear normal. There is no impacted cerumen.     Nose: Nose normal. No congestion or rhinorrhea.     Mouth/Throat:     Mouth: Mucous membranes are moist.  Eyes:     Extraocular Movements: Extraocular movements intact.     Pupils: Pupils are equal, round, and reactive to light.  Neck:     Vascular: No carotid bruit.  Cardiovascular:     Rate and Rhythm: Normal rate and regular rhythm.     Pulses: Normal pulses.     Heart sounds: Normal heart sounds. No murmur.  Pulmonary:     Effort: Pulmonary effort is normal. No respiratory distress.     Breath sounds: Normal breath sounds. No stridor. No wheezing, rhonchi or rales.  Chest:     Chest wall: No tenderness.  Abdominal:     General: Abdomen is flat. Bowel sounds are normal. There is no distension.     Palpations: Abdomen is soft. There is no mass.     Tenderness: There is no abdominal tenderness. There is no right CVA tenderness, left CVA tenderness, guarding or rebound.     Hernia: No hernia is present.  Musculoskeletal:        General: No swelling, tenderness, deformity or signs of injury. Normal range of motion.     Right lower leg: No edema.     Left lower leg: No edema.  Skin:    General: Skin is warm and dry.     Coloration: Skin is not jaundiced or pale.     Findings: No bruising, erythema, lesion or rash.  Neurological:     General: No focal deficit present.     Mental Status: He is alert and oriented to person, place, and time.  Psychiatric:        Mood and Affect: Mood normal.        Behavior: Behavior normal.        Thought Content: Thought content normal.        Judgment: Judgment normal.       Assessment & Plan:  1. Need for pneumococcal vaccination  - Pneumococcal conjugate vaccine 13-valent  2. Need for immunization against  influenza  - Flu Vaccine QUAD High Dose(Fluad)  3. Primary osteoarthritis of right knee - Follow up with orthopedics  4. CAD S/P percutaneous coronary angioplasty - Will refill medications but needs to follow up with cardiology for further refills.  - Continue with ASA 81 mg. Will hold off on refilling Brilinta until seen by Cardiology  - CBC with Differential/Platelet; Future - CMP; Future - Lipid panel; Future - TSH; Future - atorvastatin (LIPITOR) 80 MG tablet; Take 1 tablet (80 mg total) by mouth daily.  Dispense: 30 tablet; Refill: 2 - ezetimibe (ZETIA) 10 MG tablet; Take 1 tablet (10 mg total) by mouth daily.  Dispense: 30 tablet; Refill: 2 - metoprolol succinate (TOPROL-XL) 25 MG 24 hr tablet; Take 1 tablet (25 mg total) by mouth daily.  Dispense: 30 tablet; Refill: 2  5. Obesity (BMI 30-39.9) - Encouraged weight loss through diet and exercise  - CBC with Differential/Platelet; Future - CMP; Future - Lipid panel; Future - TSH; Future  6. Essential hypertension - Needs to take all his medications.  - Follow up with Cardiology  - CBC with Differential/Platelet; Future - CMP; Future - Lipid panel; Future -  TSH; Future - amLODipine (NORVASC) 10 MG tablet; Take 1 tablet (10 mg total) by mouth daily.  Dispense: 30 tablet; Refill: 2 - lisinopril (ZESTRIL) 20 MG tablet; Take 1 tablet (20 mg total) by mouth daily.  Dispense: 30 tablet; Refill: 2 - metoprolol succinate (TOPROL-XL) 25 MG 24 hr tablet; Take 1 tablet (25 mg total) by mouth daily.  Dispense: 30 tablet; Refill: 2  7. Dyslipidemia, goal LDL below 70  - CBC with Differential/Platelet; Future - CMP; Future - Lipid panel; Future - TSH; Future  8. BPH associated with nocturia  - PSA; Future - tamsulosin (FLOMAX) 0.4 MG CAPS capsule; TAKE 1 CAPSULE (0.4 MG TOTAL) BY MOUTH DAILY  Dispense: 90 capsule; Refill: 3  9. Colon cancer screening  - Screening Colonoscopy  Dorothyann Peng, NP

## 2019-11-11 ENCOUNTER — Ambulatory Visit (INDEPENDENT_AMBULATORY_CARE_PROVIDER_SITE_OTHER): Payer: Medicare Other | Admitting: Adult Health

## 2019-11-11 ENCOUNTER — Encounter: Payer: Self-pay | Admitting: Adult Health

## 2019-11-11 ENCOUNTER — Other Ambulatory Visit: Payer: Self-pay

## 2019-11-11 VITALS — BP 146/70 | HR 77 | Temp 98.1°F | Ht 71.0 in | Wt 272.0 lb

## 2019-11-11 DIAGNOSIS — R351 Nocturia: Secondary | ICD-10-CM

## 2019-11-11 DIAGNOSIS — E785 Hyperlipidemia, unspecified: Secondary | ICD-10-CM | POA: Diagnosis not present

## 2019-11-11 DIAGNOSIS — I2510 Atherosclerotic heart disease of native coronary artery without angina pectoris: Secondary | ICD-10-CM | POA: Diagnosis not present

## 2019-11-11 DIAGNOSIS — G8929 Other chronic pain: Secondary | ICD-10-CM

## 2019-11-11 DIAGNOSIS — Z9861 Coronary angioplasty status: Secondary | ICD-10-CM | POA: Diagnosis not present

## 2019-11-11 DIAGNOSIS — I10 Essential (primary) hypertension: Secondary | ICD-10-CM

## 2019-11-11 DIAGNOSIS — N401 Enlarged prostate with lower urinary tract symptoms: Secondary | ICD-10-CM | POA: Diagnosis not present

## 2019-11-11 DIAGNOSIS — M25512 Pain in left shoulder: Secondary | ICD-10-CM

## 2019-11-11 DIAGNOSIS — E669 Obesity, unspecified: Secondary | ICD-10-CM

## 2019-11-11 LAB — COMPREHENSIVE METABOLIC PANEL
ALT: 12 U/L (ref 0–53)
AST: 16 U/L (ref 0–37)
Albumin: 4.2 g/dL (ref 3.5–5.2)
Alkaline Phosphatase: 100 U/L (ref 39–117)
BUN: 12 mg/dL (ref 6–23)
CO2: 29 mEq/L (ref 19–32)
Calcium: 9.4 mg/dL (ref 8.4–10.5)
Chloride: 102 mEq/L (ref 96–112)
Creatinine, Ser: 1.23 mg/dL (ref 0.40–1.50)
GFR: 71.37 mL/min (ref >60.00)
Glucose, Bld: 112 mg/dL — ABNORMAL HIGH (ref 70–99)
Potassium: 4.1 mEq/L (ref 3.5–5.1)
Sodium: 138 mEq/L (ref 135–145)
Total Bilirubin: 0.5 mg/dL (ref 0.2–1.2)
Total Protein: 7.2 g/dL (ref 6.0–8.3)

## 2019-11-11 LAB — LIPID PANEL
Cholesterol: 134 mg/dL (ref 0–200)
HDL: 37.7 mg/dL — ABNORMAL LOW (ref >39.00)
LDL Cholesterol: 74 mg/dL (ref 0–99)
NonHDL: 96.12
Total CHOL/HDL Ratio: 4
Triglycerides: 110 mg/dL (ref 0.0–149.0)
VLDL: 22 mg/dL (ref 0.0–40.0)

## 2019-11-11 LAB — CBC WITH DIFFERENTIAL/PLATELET
Basophils Absolute: 0.1 10*3/uL (ref 0.0–0.1)
Basophils Relative: 0.5 % (ref 0.0–3.0)
Eosinophils Absolute: 0.2 10*3/uL (ref 0.0–0.7)
Eosinophils Relative: 1.8 % (ref 0.0–5.0)
HCT: 38.1 % — ABNORMAL LOW (ref 39.0–52.0)
Hemoglobin: 12.6 g/dL — ABNORMAL LOW (ref 13.0–17.0)
Lymphocytes Relative: 28.9 % (ref 12.0–46.0)
Lymphs Abs: 2.9 10*3/uL (ref 0.7–4.0)
MCHC: 33.1 g/dL (ref 30.0–36.0)
MCV: 88.6 fl (ref 78.0–100.0)
Monocytes Absolute: 0.8 10*3/uL (ref 0.1–1.0)
Monocytes Relative: 7.9 % (ref 3.0–12.0)
Neutro Abs: 6.1 10*3/uL (ref 1.4–7.7)
Neutrophils Relative %: 60.9 % (ref 43.0–77.0)
Platelets: 303 10*3/uL (ref 150.0–400.0)
RBC: 4.31 Mil/uL (ref 4.22–5.81)
RDW: 14.2 % (ref 11.5–15.5)
WBC: 10 10*3/uL (ref 4.0–10.5)

## 2019-11-11 LAB — PSA: PSA: 3.2 ng/mL (ref 0.10–4.00)

## 2019-11-11 LAB — TSH: TSH: 2.26 u[IU]/mL (ref 0.35–4.50)

## 2019-11-11 MED ORDER — METHYLPREDNISOLONE ACETATE 80 MG/ML IJ SUSP
80.0000 mg | Freq: Once | INTRAMUSCULAR | Status: AC
  Administered 2019-11-11: 09:00:00 80 mg via INTRA_ARTICULAR

## 2019-11-11 MED ORDER — METHYLPREDNISOLONE ACETATE 80 MG/ML IJ SUSP: 80.0000 mg | Freq: Once | INTRAMUSCULAR | Status: DC

## 2019-11-11 MED ORDER — LIDOCAINE HCL 2 % IJ SOLN
2.0000 mL | Freq: Once | INTRAMUSCULAR | Status: AC
  Administered 2019-11-11: 09:00:00 40 mg

## 2019-11-11 NOTE — Progress Notes (Addendum)
Subjective:    Patient ID: Jacob Quinn, male    DOB: 1956/09/05, 64 y.o.   MRN: [id number]  HPI 64 year old male who  has a past medical history of 1st degree AV block, BPH associated with nocturia, CAD in native artery, Encephalitis, viral, Hyperlipidemia (08/07/2016), Hypertension, Ischemic cardiomyopathy, OA (osteoarthritis), Obesity (BMI 30-39.9) (08/07/2016), Renal insufficiency, Restless leg syndrome (09/16/2010), Rheumatic fever, and Syncope (2009).  He presents to the office today for  chronicleft shoulder pain. His pain has been present for some time now but he feels as though the pain he has been experiencing has been worsening over the last few weeks.  Reports discomfort is worse when he sleeps on his left side and when he is performing some types of ROM ( usually side to side and reaching movements) . He denies issues with grip strength. Denies trauma or injury to left shoulder.   He is also here to get fasting labs done. He had his CPE last week but was not fasting    Review of Systems See HPI   Past Medical History:  Diagnosis Date  . 1st degree AV block   . BPH associated with nocturia   . CAD in native artery    a. NSTEMI 07/2016 -  LHC 08/07/16 showing 70% D1, 20% mLAD, segmental 95% then 75% OM stenosis, 85% acute marg, normal LVEDP - received PTCA/DES to OM.  Marland Kitchen Encephalitis, viral   . Hyperlipidemia 08/07/2016  . Hypertension   . Ischemic cardiomyopathy    a. LHC 07/2016 - mild mid anterolateral focal hypocontractility felt to be due to the circumflex stenosis, EF 50%.  . OA (osteoarthritis)   . Obesity (BMI 30-39.9) 08/07/2016  . Renal insufficiency    a. 2016 r/t dehydration/ACEI use.  Marland Kitchen Restless leg syndrome 09/16/2010  . Rheumatic fever   . Syncope 2009   associated with viral URI    Social History   Socioeconomic History  . Marital status: Single    Spouse name: Not on file  . Number of children: 3  . Years of education: Not on file  . Highest  education level: Not on file  Occupational History  . 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 Smoking status: Never Smoker  . Smokeless tobacco: Never Used  Substance and Sexual Activity  . Alcohol use: No  . Drug use: No  . Sexual activity: Yes  Other Topics Concern  . Not on file  Social History Narrative   Lives with daughter   Is retired -    Social Determinants of Radio broadcast assistant Strain:   . Difficulty of Paying Living Expenses: Not on file  Food Insecurity:   . Worried About Charity fundraiser in the Last Year: Not on file  . Ran Out of Food in the Last Year: Not on file  Transportation Needs:   . Lack of Transportation (Medical): Not on file  . Lack of Transportation (Non-Medical): Not on file  Physical Activity:   . Days of Exercise per Week: Not on file  . Minutes of Exercise per Session: Not on file  Stress:   . Feeling of Stress : Not on file  Social Connections:   . Frequency of Communication with Friends and Family: Not on file  . Frequency of Social Gatherings with Friends and Family: Not on file  . Attends Religious Services: Not on file  .  Active Member of Clubs or Organizations: Not on file  . Attends Archivist Meetings: Not on file  . Marital Status: Not on file  Intimate Partner Violence:   . Fear of Current or Ex-Partner: Not on file  . Emotionally Abused: Not on file  . Physically Abused: Not on file  . Sexually Abused: Not on file    Past Surgical History:  Procedure Laterality Date  . CARDIAC CATHETERIZATION N/A 08/07/2016   Procedure: Left Heart Cath and Coronary Angiography;  Surgeon: Troy Sine, MD;  Location: [place] [facility];  Service: Cardiovascular;  Laterality: N/A;  . CARDIAC CATHETERIZATION N/A 08/07/2016   Procedure: Coronary Stent Intervention;  Surgeon: Troy Sine, MD;  Location: [place] [facility];  Service: Cardiovascular;  Laterality:  N/A;  . REPLACEMENT TOTAL KNEE     left and right-  5 total   . TONSILLECTOMY    . TOTAL KNEE REVISION Left 11/19/2017   Procedure: LEFT TOTAL KNEE REVISION;  Surgeon: Frederik Pear, MD;  Location: [place];  Service: Orthopedics;  Laterality: Left;  . TOTAL KNEE REVISION Right 04/22/2018   Procedure: RIGHT TOTAL KNEE REVISION;  Surgeon: Frederik Pear, MD;  Location: [facility];  Service: Orthopedics;  Laterality: Right;  block    Family History  Problem Relation Age of Onset  . Hypertension Mother   . Heart disease Mother   . Prostate cancer Father   . Dementia Father   . Depression Brother   . Hypertension Brother   . Hypertension Brother   . Stroke Brother     Allergies  Allergen Reactions  . Penicillins Anaphylaxis    *Has tolerated cephalosporins in the past*  Has patient had a PCN reaction causing immediate rash, facial/tongue/throat swelling, SOB or lightheadedness with hypotension: Yes Has patient had a PCN reaction causing severe rash involving mucus membranes or skin necrosis: No Has patient had a PCN reaction that required hospitalization: No Has patient had a PCN reaction occurring within the last 10 years: No If all of the above answers are "NO", then may proceed with Cephalosporin use.   . Tizanidine Other (See Comments)    High fevers, shaking  . Latex Rash    Current Outpatient Medications on File Prior to Visit  Medication Sig Dispense Refill  . amLODipine (NORVASC) 10 MG tablet Take 1 tablet (10 mg total) by mouth daily. 30 tablet 2  . aspirin 81 MG tablet Take 81 mg by mouth daily.    Marland Kitchen atorvastatin (LIPITOR) 80 MG tablet Take 1 tablet (80 mg total) by mouth daily. 30 tablet 2  . Evolocumab (REPATHA SURECLICK) [id number] MG/ML SOAJ Inject 140 mg into the skin every 14 (fourteen) days. (Patient not taking: Reported on 11/04/2019) 6 pen 3  . ezetimibe (ZETIA) 10 MG tablet Take 1 tablet (10 mg total) by mouth daily. 30 tablet 2  . lisinopril (ZESTRIL) 20 MG tablet Take 1  tablet (20 mg total) by mouth daily. 30 tablet 2  . metoprolol succinate (TOPROL-XL) 25 MG 24 hr tablet Take 1 tablet (25 mg total) by mouth daily. 30 tablet 2  . Multiple Vitamin (MULTI VITAMIN DAILY) TABS Take 1 tablet by mouth daily. (Patient not taking: Reported on 11/04/2019)    . nitroGLYCERIN (NITROSTAT) 0.4 MG SL tablet Place 1 tablet (0.4 mg total) under the tongue every 5 (five) minutes as needed for chest pain. Up to 3 doses. (Patient not taking: Reported on 11/04/2019) 25 tablet 3  . tamsulosin (FLOMAX) 0.4  MG CAPS capsule TAKE 1 CAPSULE (0.4 MG TOTAL) BY MOUTH DAILY 90 capsule 3  . ticagrelor (BRILINTA) 90 MG TABS tablet Take 1 tablet (90 mg total) by mouth every 12 (twelve) hours. (Patient not taking: Reported on 11/04/2019) 180 tablet 3   No current facility-administered medications on file prior to visit.    BP (!) 146/70   Pulse 77   Temp 98.1 F (36.7 C) (Other (Comment))   Ht 5\' 11"  (1.803 m)   Wt 272 lb (123.4 kg)   SpO2 97%   BMI 37.94 kg/m       Objective:   Physical Exam Vitals and nursing note reviewed.  Constitutional:      Appearance: Normal appearance.  Musculoskeletal:        General: Tenderness (throughout left shoulder with palpation ) present. No swelling. Normal range of motion.     Comments: He is able to raise his left arm above his head but has pain with doing so. He is also able to perform Apley back scratch test but with discomfort.   Skin:    General: Skin is warm and dry.     Capillary Refill: Capillary refill takes less than 2 seconds.  Neurological:     General: No focal deficit present.     Mental Status: He is alert and oriented to person, place, and time. Mental status is at baseline.     Motor: No weakness.  Psychiatric:        Mood and Affect: Mood normal.        Behavior: Behavior normal.        Thought Content: Thought content normal.        Judgment: Judgment normal.       Assessment & Plan:  1. Chronic left shoulder  pain Discussed risks and benefits of corticosteroid injection and patient consented.  After prepping skin with betadine, injected 80 mg depomedrol and 2 cc of plain xylocaine with 22 gauge one and one half inch needle using posterorolateral approach and pt tolerated well.  - methylPREDNISolone acetate (DEPO-MEDROL) injection 80 mg - He was advised that injection can take up to 3 days to work completely - Follow up as needed  2. BPH associated with nocturia  - PSA  3. CAD S/P percutaneous coronary angioplasty  - TSH - Lipid panel - CMP - CBC with Differential/Platelet  4. Obesity (BMI 30-39.9)  - TSH - Lipid panel - CMP - CBC with Differential/Platelet  5. Essential hypertension  - TSH - Lipid panel - CMP - CBC with Differential/Platelet  6. Dyslipidemia, goal LDL below 70  - TSH - Lipid panel - CMP - CBC with Differential/Platelet

## 2019-11-11 NOTE — Patient Instructions (Signed)
Health Maintenance Due  Topic Date Due  . COLONOSCOPY  05/22/2015    Depression screen [facility] 2/9 11/04/2019 01/26/2017 09/25/2016  Decreased Interest 0 0 1  Down, Depressed, Hopeless 0 0 1  PHQ - 2 Score 0 0 2

## 2019-11-11 NOTE — Addendum Note (Signed)
Addended by: Gwenyth Ober R on: 11/11/2019 09:01 AM   Modules accepted: Orders

## 2019-11-19 ENCOUNTER — Encounter: Payer: Self-pay | Admitting: Orthopedic Surgery

## 2019-11-19 ENCOUNTER — Ambulatory Visit (INDEPENDENT_AMBULATORY_CARE_PROVIDER_SITE_OTHER): Payer: Medicare Other | Admitting: Orthopedic Surgery

## 2019-11-19 ENCOUNTER — Ambulatory Visit: Payer: Self-pay

## 2019-11-19 ENCOUNTER — Other Ambulatory Visit: Payer: Self-pay

## 2019-11-19 DIAGNOSIS — G8929 Other chronic pain: Secondary | ICD-10-CM | POA: Diagnosis not present

## 2019-11-19 DIAGNOSIS — M25561 Pain in right knee: Secondary | ICD-10-CM | POA: Diagnosis not present

## 2019-11-19 DIAGNOSIS — M25562 Pain in left knee: Secondary | ICD-10-CM | POA: Diagnosis not present

## 2019-11-19 MED ORDER — OXYCODONE-ACETAMINOPHEN 5-325 MG PO TABS: 1.0000 | ORAL_TABLET | Freq: Every day | ORAL | 0 refills | Status: DC | PRN

## 2019-11-19 MED ORDER — IBUPROFEN 800 MG PO TABS: 800.0000 mg | ORAL_TABLET | Freq: Every day | ORAL | 0 refills | Status: DC | PRN

## 2019-11-19 NOTE — Progress Notes (Signed)
Office Visit Note   Patient: Jacob Quinn           Date of Birth: Dec 18, 1955           MRN: [id number] Visit Date: 11/19/2019 Requested by: Dorothyann Peng, NP [place],  [place] [address] PCP: Dorothyann Peng, NP  Subjective: Chief Complaint  Patient presents with  . Left Knee - Pain  . Right Knee - Pain    HPI: Jacob Quinn is a patient with bilateral knee pain.  2 years out from left revision knee replacement to a hinged knee.  This was done by another physician.  Does report mildly constant pain on that left-hand side with some episodes of giving way.  He had his right unicompartmental knee revised to a total knee replacement as well.  Generally doing okay with his low back although he has occasional left-sided radiculopathy into the gluteal region.  Does do a home exercise program for his back.              ROS: All systems reviewed are negative as they relate to the chief complaint within the history of present illness.  Patient denies  fevers or chills.   Assessment & Plan: Visit Diagnoses:  1. Chronic pain of both knees     Plan: Impression is left knee pain with no effusion and pretty reasonable range of motion.  No effusion in the right knee either.  Radiographs look pretty reasonable although there is a little bit of interface lucency in the proximal cement around the tibial implant and distal cement around the femoral implant.  Nonetheless I think his knee has had its final operation on the left-hand side.  No evidence of infection and so that is going to be something we watch.  The right knee is also doing reasonably well.  Has about 90 degrees of flexion and no effusion.  He is can start bowling.  Talked about exercises to do which for him would be primarily stationary bike and low weight leg press.  Ibuprofen prescribed along with Percocet to be taken only as an as-needed basis.  Follow-up with me as needed.  Follow-Up Instructions: Return if symptoms  worsen or fail to improve.   Orders:  Orders Placed This Encounter  Procedures  . XR KNEE 3 VIEW RIGHT  . XR KNEE 3 VIEW LEFT   Meds ordered this encounter  Medications  . oxyCODONE-acetaminophen (PERCOCET/ROXICET) 5-325 MG tablet    Sig: Take 1 tablet by mouth daily as needed for severe pain.    Dispense:  35 tablet    Refill:  0  . ibuprofen (ADVIL) 800 MG tablet    Sig: Take 1 tablet (800 mg total) by mouth daily as needed.    Dispense:  60 tablet    Refill:  0      Procedures: No procedures performed   Clinical Data: No additional findings.  Objective: Vital Signs: There were no vitals taken for this visit.  Physical Exam:   Constitutional: Patient appears well-developed HEENT:  Head: Normocephalic Eyes:EOM are normal Neck: Normal range of motion Cardiovascular: Normal rate Pulmonary/chest: Effort normal Neurologic: Patient is alert Skin: Skin is warm Psychiatric: Patient has normal mood and affect    Ortho Exam: Ortho exam demonstrates intact extensor mechanism bilaterally.  Collaterals are stable.  This is on the right-hand side.  On the left-hand side the hinged knee has less stability than the right but appropriate stability for hinged knee.  No effusion  in either knee.  No warmth in either knee.  Pedal pulses palpable.  Specialty Comments:  No specialty comments available.  Imaging: XR KNEE 3 VIEW LEFT  Result Date: 11/19/2019 AP lateral merchant left knee reviewed.  Revision total knee prosthesis in good position alignment.  Slight lucency around the proximal and distal cement within the metaphysis of the femur and tibia noted.  Hinged type prosthesis otherwise well aligned with no complicating features.  Bottom of the tibial prosthesis not visualized.  XR KNEE 3 VIEW RIGHT  Result Date: 11/19/2019 AP lateral merchant right knee reviewed.  Revision total knee prosthesis in good position alignment with no complicating features.  Bottom of the  prosthesis is not visualized on the tibial side.  No evidence of loosening or malalignment.  Patella is centered within the trochlear groove.    PMFS History: Patient Active Problem List   Diagnosis Date Noted  . Syncope and collapse 05/01/2018  . Primary osteoarthritis of right knee 04/22/2018  . Pain due to unicompartmental arthroplasty of knee ([facility]) 04/19/2018  . Post op infection 11/22/2017  . Primary osteoarthritis of left knee 11/19/2017  . 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 Presence of left artificial knee joint 12/20/2016  . Presence of right artificial knee joint 12/20/2016  . Essential hypertension 08/08/2016  . Cardiomyopathy, ischemic 08/08/2016  . History of NSTEMI 08/07/2016  . Obesity (BMI 30-39.9) 08/07/2016  . Dyslipidemia, goal LDL below 70 08/07/2016  . CAD S/P percutaneous coronary angioplasty 08/07/2016  . Restless leg syndrome 09/16/2010  . Osteoarthritis 11/22/2007   Past Medical History:  Diagnosis Date  . 1st degree AV block   . BPH associated with nocturia   . CAD in native artery    a. NSTEMI 07/2016 -  LHC 08/07/16 showing 70% D1, 20% mLAD, segmental 95% then 75% OM stenosis, 85% acute marg, normal LVEDP - received PTCA/DES to OM.  Marland Kitchen Encephalitis, viral   . Hyperlipidemia 08/07/2016  . Hypertension   . Ischemic cardiomyopathy    a. LHC 07/2016 - mild mid anterolateral focal hypocontractility felt to be due to the circumflex stenosis, EF 50%.  . OA (osteoarthritis)   . Obesity (BMI 30-39.9) 08/07/2016  . Renal insufficiency    a. 2016 r/t dehydration/ACEI use.  Marland Kitchen Restless leg syndrome 09/16/2010  . Rheumatic fever   . Syncope 2009   associated with viral URI    Family History  Problem Relation Age of Onset  . Hypertension Mother   . Heart disease Mother   . Prostate cancer Father   . Dementia Father   . Depression Brother   . Hypertension Brother   . Hypertension Brother   . Stroke Brother     Past Surgical History:   Procedure Laterality Date  . CARDIAC CATHETERIZATION N/A 08/07/2016   Procedure: Left Heart Cath and Coronary Angiography;  Surgeon: Troy Sine, MD;  Location: [facility];  Service: Cardiovascular;  Laterality: N/A;  . CARDIAC CATHETERIZATION N/A 08/07/2016   Procedure: Coronary Stent Intervention;  Surgeon: Troy Sine, MD;  Location: [facility];  Service: Cardiovascular;  Laterality: N/A;  . REPLACEMENT TOTAL KNEE     left and right-  5 total   . TONSILLECTOMY    . TOTAL KNEE REVISION Left 11/19/2017   Procedure: LEFT TOTAL KNEE REVISION;  Surgeon: Frederik Pear, MD;  Location: [facility];  Service: Orthopedics;  Laterality: Left;  . TOTAL KNEE REVISION Right 04/22/2018   Procedure: RIGHT TOTAL  KNEE REVISION;  Surgeon: Frederik Pear, MD;  Location: [facility];  Service: Orthopedics;  Laterality: Right;  block   Social History   Occupational History  . Occupation: English as a second language teacher: [employer]    Comment: [name]  Tobacco Use  . Smoking status: Never Smoker  . Smokeless tobacco: Never Used  Substance and Sexual Activity  . Alcohol use: No  . Drug use: No  . Sexual activity: Yes

## 2019-11-21 ENCOUNTER — Encounter: Payer: Self-pay | Admitting: Gastroenterology

## 2019-11-26 ENCOUNTER — Telehealth: Payer: Self-pay | Admitting: *Deleted

## 2019-11-26 NOTE — Telephone Encounter (Signed)
Pt called stating that he did not receive our letter so he did not know our address. PV and proc have been r/s to different dates. Pt was given our address over the phone.

## 2019-11-26 NOTE — Telephone Encounter (Signed)
924- pt is a NOS to his PV.  Attempted to reach him, but no answer and and no voicemail set up.  Will try again later

## 2019-12-10 ENCOUNTER — Encounter: Payer: Medicare Other | Admitting: Gastroenterology

## 2019-12-22 ENCOUNTER — Other Ambulatory Visit: Payer: Self-pay

## 2019-12-22 ENCOUNTER — Ambulatory Visit (AMBULATORY_SURGERY_CENTER): Payer: Self-pay | Admitting: *Deleted

## 2019-12-22 VITALS — Temp 97.5°F | Ht 71.0 in | Wt 269.0 lb

## 2019-12-22 DIAGNOSIS — Z01818 Encounter for other preprocedural examination: Secondary | ICD-10-CM

## 2019-12-22 DIAGNOSIS — Z86010 Personal history of colonic polyps: Secondary | ICD-10-CM

## 2019-12-22 NOTE — Progress Notes (Signed)
Patient is here in-person for PV. Patient denies any allergies to eggs or soy. Patient denies any problems with anesthesia/sedation. Patient denies any oxygen use at home. Patient denies taking any diet/weight loss medications or blood thinners. Patient is not being treated for MRSA or C-diff. EMMI education assisgned to the patient for the procedure, this was explained and instructions given to patient. COVID-19 screening test is on 2/25, the pt is aware. Pt is aware that care partner will wait in the car during procedure; if they feel like they will be too hot or cold to wait in the car; they may wait in the 4 th floor lobby. Patient is aware to bring only one care partner. We want them to wear a mask (we do not have any that we can provide them), practice social distancing, and we will check their temperatures when they get here.  I did remind the patient that their care partner needs to stay in the parking lot the entire time and have a cell phone available, we will call them when the pt is ready for discharge. Patient will wear mask into building.

## 2019-12-25 ENCOUNTER — Ambulatory Visit (INDEPENDENT_AMBULATORY_CARE_PROVIDER_SITE_OTHER): Payer: Medicare Other

## 2019-12-25 DIAGNOSIS — Z1159 Encounter for screening for other viral diseases: Secondary | ICD-10-CM | POA: Diagnosis not present

## 2019-12-26 ENCOUNTER — Encounter: Payer: Self-pay | Admitting: Gastroenterology

## 2019-12-26 LAB — SARS CORONAVIRUS 2 (TAT 6-24 HRS): SARS Coronavirus 2: NEGATIVE

## 2019-12-29 ENCOUNTER — Encounter: Payer: Self-pay | Admitting: Gastroenterology

## 2019-12-29 ENCOUNTER — Other Ambulatory Visit: Payer: Self-pay

## 2019-12-29 ENCOUNTER — Ambulatory Visit (AMBULATORY_SURGERY_CENTER): Payer: Medicare Other | Admitting: Gastroenterology

## 2019-12-29 VITALS — BP 151/89 | HR 78 | Temp 97.7°F | Resp 14 | Ht 71.0 in | Wt 269.0 lb

## 2019-12-29 DIAGNOSIS — D122 Benign neoplasm of ascending colon: Secondary | ICD-10-CM

## 2019-12-29 DIAGNOSIS — D123 Benign neoplasm of transverse colon: Secondary | ICD-10-CM

## 2019-12-29 DIAGNOSIS — Z86010 Personal history of colonic polyps: Secondary | ICD-10-CM | POA: Diagnosis not present

## 2019-12-29 DIAGNOSIS — D128 Benign neoplasm of rectum: Secondary | ICD-10-CM

## 2019-12-29 HISTORY — PX: COLONOSCOPY: SHX174

## 2019-12-29 MED ORDER — SODIUM CHLORIDE 0.9 % IV SOLN: 500.0000 mL | Freq: Once | INTRAVENOUS | Status: DC

## 2019-12-29 NOTE — Progress Notes (Signed)
Called to room to assist during endoscopic procedure.  Patient ID and intended procedure confirmed with present staff. Received instructions for my participation in the procedure from the performing physician.  

## 2019-12-29 NOTE — Patient Instructions (Signed)
Handouts given:  Polyps  Continue previous diet Resume current medications  await pathology results    YOU HAD AN ENDOSCOPIC PROCEDURE TODAY AT [facility]:   Refer to the procedure report that was given to you for any specific questions about what was found during the examination.  If the procedure report does not answer your questions, please call your gastroenterologist to clarify.  If you requested that your care partner not be given the details of your procedure findings, then the procedure report has been included in a sealed envelope for you to review at your convenience later.  YOU SHOULD EXPECT: Some feelings of bloating in the abdomen. Passage of more gas than usual.  Walking can help get rid of the air that was put into your GI tract during the procedure and reduce the bloating. If you had a lower endoscopy (such as a colonoscopy or flexible sigmoidoscopy) you may notice spotting of blood in your stool or on the toilet paper. If you underwent a bowel prep for your procedure, you may not have a normal bowel movement for a few days.  Please Note:  You might notice some irritation and congestion in your nose or some drainage.  This is from the oxygen used during your procedure.  There is no need for concern and it should clear up in a day or so.  SYMPTOMS TO REPORT IMMEDIATELY:   Following lower endoscopy (colonoscopy or flexible sigmoidoscopy):  Excessive amounts of blood in the stool  Significant tenderness or worsening of abdominal pains  Swelling of the abdomen that is new, acute  Fever of 100F or higher   For urgent or emergent issues, a gastroenterologist can be reached at any hour by calling [phone].   DIET:  We do recommend a small meal at first, but then you may proceed to your regular diet.  Drink plenty of fluids but you should avoid alcoholic beverages for 24 hours.  ACTIVITY:  You should plan to take it easy for the rest of today and you  should NOT DRIVE or use heavy machinery until tomorrow (because of the sedation medicines used during the test).    FOLLOW UP: Our staff will call the number listed on your records 48-72 hours following your procedure to check on you and address any questions or concerns that you may have regarding the information given to you following your procedure. If we do not reach you, we will leave a message.  We will attempt to reach you two times.  During this call, we will ask if you have developed any symptoms of COVID 19. If you develop any symptoms (ie: fever, flu-like symptoms, shortness of breath, cough etc.) before then, please call [phone].  If you test positive for Covid 19 in the 2 weeks post procedure, please call and report this information to [place].    If any biopsies were taken you will be contacted by phone or by letter within the next 1-3 weeks.  Please call us at [phone] if you have not heard about the biopsies in 3 weeks.    SIGNATURES/CONFIDENTIALITY: You and/or your care partner have signed paperwork which will be entered into your electronic medical record.  These signatures attest to the fact that that the information above on your After Visit Summary has been reviewed and is understood.  Full responsibility of the confidentiality of this discharge information lies with you and/or your care-partner.

## 2019-12-29 NOTE — Progress Notes (Signed)
PT taken to PACU. Monitors in place. VSS. Report given to RN. 

## 2019-12-29 NOTE — Progress Notes (Signed)
Pt's states no medical or surgical changes since previsit or office visit.  Temp JB Vitals KA

## 2019-12-29 NOTE — Op Note (Signed)
[facility] Patient Name: Jacob Quinn Procedure Date: 12/29/2019 7:25 AM MRN: [id number] Endoscopist: Milus Banister , MD Age: 64 Referring MD:  Date of Birth: 31-Mar-1956 Gender: Male Account #: [id number] Procedure:                Colonoscopy Indications:              High risk colon cancer surveillance: Personal                            history of colonic polyps; Colonoscopy 2013 5                            adenomas removed including 2 which were >1cm Medicines:                Monitored Anesthesia Care Procedure:                Pre-Anesthesia Assessment:                           - Prior to the procedure, a History and Physical                            was performed, and patient medications and                            allergies were reviewed. The patient's tolerance of                            previous anesthesia was also reviewed. The risks                            and benefits of the procedure and the sedation                            options and risks were discussed with the patient.                            All questions were answered, and informed consent                            was obtained. Prior Anticoagulants: The patient has                            taken no previous anticoagulant or antiplatelet                            agents. ASA Grade Assessment: II - A patient with                            mild systemic disease. After reviewing the risks                            and benefits, the patient was deemed in  satisfactory condition to undergo the procedure.                           After obtaining informed consent, the colonoscope                            was passed under direct vision. Throughout the                            procedure, the patient's blood pressure, pulse, and                            oxygen saturations were monitored continuously. The                            Colonoscope was  introduced through the anus and                            advanced to the the cecum, identified by                            appendiceal orifice and ileocecal valve. The                            colonoscopy was performed without difficulty. The                            patient tolerated the procedure well. The quality                            of the bowel preparation was good. The ileocecal                            valve, appendiceal orifice, and rectum were                            photographed. Scope In: 8:00:11 AM Scope Out: 8:14:43 AM Scope Withdrawal Time: 0 hours 12 minutes 11 seconds  Total Procedure Duration: 0 hours 14 minutes 32 seconds  Findings:                 Four sessile polyps were found in the rectum,                            transverse colon and ascending colon. The polyps                            were 2 to 5 mm in size. These polyps were removed                            with a cold snare. Resection and retrieval were                            complete.  The exam was otherwise without abnormality on                            direct and retroflexion views. Complications:            No immediate complications. Estimated blood loss:                            None. Estimated Blood Loss:     Estimated blood loss: none. Impression:               - Four 2 to 5 mm polyps in the rectum, in the                            transverse colon and in the ascending colon,                            removed with a cold snare. Resected and retrieved.                           - The examination was otherwise normal on direct                            and retroflexion views. Recommendation:           - Patient has a contact number available for                            emergencies. The signs and symptoms of potential                            delayed complications were discussed with the                            patient. Return to normal  activities tomorrow.                            Written discharge instructions were provided to the                            patient.                           - Resume previous diet.                           - Continue present medications.                           - Await pathology results. Milus Banister, MD 12/29/2019 8:21:19 AM This report has been signed electronically.

## 2019-12-31 ENCOUNTER — Telehealth: Payer: Self-pay

## 2019-12-31 NOTE — Telephone Encounter (Signed)
  Follow up Call-  Call back number 12/29/2019  Post procedure Call Back phone  # [phone]  Permission to leave phone message Yes  Some recent data might be hidden     Patient questions:  Do you have a fever, pain , or abdominal swelling? No. Pain Score  0 *  Have you tolerated food without any problems? Yes.    Have you been able to return to your normal activities? Yes.    Do you have any questions about your discharge instructions: Diet   No. Medications  No. Follow up visit  No.  Do you have questions or concerns about your Care? No.  Actions: * If pain score is 4 or above: No action needed, pain <4.  Have you developed a fever since your procedure? No 2.   Have you had an respiratory symptoms (SOB or cough) since your procedure? No  3.   Have you tested positive for COVID 19 since your procedure No  4.   Have you had any family members/close contacts diagnosed with the COVID 19 since your procedure?  No   If yes to any of these questions please route to Joylene John, RN and Alphonsa Gin, RN.

## 2020-01-05 ENCOUNTER — Encounter: Payer: Self-pay | Admitting: Gastroenterology

## 2020-01-07 ENCOUNTER — Other Ambulatory Visit: Payer: Self-pay | Admitting: Orthopedic Surgery

## 2020-01-07 NOTE — Telephone Encounter (Signed)
Please advise 

## 2020-01-27 ENCOUNTER — Other Ambulatory Visit: Payer: Self-pay | Admitting: Adult Health

## 2020-01-27 DIAGNOSIS — I2510 Atherosclerotic heart disease of native coronary artery without angina pectoris: Secondary | ICD-10-CM

## 2020-01-27 DIAGNOSIS — I10 Essential (primary) hypertension: Secondary | ICD-10-CM

## 2020-01-28 NOTE — Telephone Encounter (Signed)
PAST DUE FOR CPX.

## 2020-02-05 ENCOUNTER — Other Ambulatory Visit: Payer: Self-pay | Admitting: Adult Health

## 2020-02-05 DIAGNOSIS — I10 Essential (primary) hypertension: Secondary | ICD-10-CM

## 2020-02-05 DIAGNOSIS — I2510 Atherosclerotic heart disease of native coronary artery without angina pectoris: Secondary | ICD-10-CM

## 2020-02-05 MED ORDER — METOPROLOL SUCCINATE ER 25 MG PO TB24: 25.0000 mg | ORAL_TABLET | Freq: Every day | ORAL | 2 refills | Status: DC

## 2020-02-05 MED ORDER — AMLODIPINE BESYLATE 10 MG PO TABS: 10.0000 mg | ORAL_TABLET | Freq: Every day | ORAL | 2 refills | Status: DC

## 2020-02-05 NOTE — Telephone Encounter (Signed)
SENT TO THE PHARMACY BY E-SCRIBE. 

## 2020-02-12 ENCOUNTER — Other Ambulatory Visit: Payer: Self-pay | Admitting: Adult Health

## 2020-02-12 DIAGNOSIS — M25561 Pain in right knee: Secondary | ICD-10-CM | POA: Diagnosis not present

## 2020-02-12 DIAGNOSIS — Z96652 Presence of left artificial knee joint: Secondary | ICD-10-CM | POA: Diagnosis not present

## 2020-02-12 DIAGNOSIS — Z96651 Presence of right artificial knee joint: Secondary | ICD-10-CM | POA: Diagnosis not present

## 2020-02-12 DIAGNOSIS — Z9861 Coronary angioplasty status: Secondary | ICD-10-CM

## 2020-02-12 DIAGNOSIS — M25562 Pain in left knee: Secondary | ICD-10-CM | POA: Diagnosis not present

## 2020-02-12 DIAGNOSIS — I2510 Atherosclerotic heart disease of native coronary artery without angina pectoris: Secondary | ICD-10-CM

## 2020-02-12 NOTE — Telephone Encounter (Signed)
DENIED.  NEEDS YEARLY PHYSICAL.

## 2020-02-27 ENCOUNTER — Encounter: Payer: Self-pay | Admitting: Physician Assistant

## 2020-02-27 ENCOUNTER — Other Ambulatory Visit: Payer: Self-pay

## 2020-02-27 ENCOUNTER — Ambulatory Visit: Payer: Medicare Other | Admitting: Physician Assistant

## 2020-02-27 VITALS — BP 146/88 | HR 60 | Temp 97.7°F | Ht 73.0 in | Wt 270.2 lb

## 2020-02-27 DIAGNOSIS — R55 Syncope and collapse: Secondary | ICD-10-CM

## 2020-02-27 DIAGNOSIS — M7989 Other specified soft tissue disorders: Secondary | ICD-10-CM

## 2020-02-27 DIAGNOSIS — E785 Hyperlipidemia, unspecified: Secondary | ICD-10-CM

## 2020-02-27 DIAGNOSIS — Z9861 Coronary angioplasty status: Secondary | ICD-10-CM

## 2020-02-27 DIAGNOSIS — R252 Cramp and spasm: Secondary | ICD-10-CM

## 2020-02-27 DIAGNOSIS — I2510 Atherosclerotic heart disease of native coronary artery without angina pectoris: Secondary | ICD-10-CM | POA: Diagnosis not present

## 2020-02-27 DIAGNOSIS — I10 Essential (primary) hypertension: Secondary | ICD-10-CM

## 2020-02-27 MED ORDER — HYDROCHLOROTHIAZIDE 25 MG PO TABS
25.0000 mg | ORAL_TABLET | Freq: Every day | ORAL | 3 refills | Status: DC
Start: 2020-02-27 — End: 2020-03-26

## 2020-02-27 MED ORDER — EZETIMIBE 10 MG PO TABS: 10.0000 mg | ORAL_TABLET | Freq: Every day | ORAL | 3 refills | Status: DC

## 2020-02-27 MED ORDER — REPATHA SURECLICK 140 MG/ML ~~LOC~~ SOAJ: 140.0000 mg | SUBCUTANEOUS | 3 refills | Status: DC

## 2020-02-27 NOTE — Patient Instructions (Signed)
Medication Instructions:   RESTART Zetia  RESTART Repatha   START HCTZ 25 mg daily *If you need a refill on your cardiac medications before your next appointment, please call your pharmacy*  Lab Work: Your physician recommends that you return for lab work in [place] If you have labs (blood work) drawn today and your tests are completely normal, you will receive your results only by: Marland Kitchen MyChart Message (if you have MyChart) OR . A paper copy in the mail If you have any lab test that is abnormal or we need to change your treatment, we will call you to review the results.  Testing/Procedures: Your physician has requested that you have an echocardiogram. Echocardiography is a painless test that uses sound waves to create images of your heart. It provides your doctor with information about the size and shape of your heart and how well your heart's chambers and valves are working. This procedure takes approximately one hour. There are no restrictions for this procedure.   Please schedule for 2-3 weeks    Follow-Up: At [facility], you and your health needs are our priority.  As part of our continuing mission to provide you with exceptional heart care, we have created designated Provider Care Teams.  These Care Teams include your primary Cardiologist (physician) and Advanced Practice Providers (APPs -  Physician Assistants and Nurse Practitioners) who all work together to provide you with the care you need, when you need it.  Your next appointment:   4 week(s)  The format for your next appointment:   In Person  Provider:   Almyra Deforest, PA-C  Other Instructions

## 2020-02-27 NOTE — Progress Notes (Signed)
Cardiology Office Note:    Date:  02/29/2020   ID:  Jacob Quinn, DOB 07-04-1956, MRN [id number]  PCP:  Dorothyann Peng, NP  Cardiologist:  Shelva Majestic, MD  Electrophysiologist:  None   Referring MD: Dorothyann Peng, NP   Chief Complaint  Patient presents with  . Follow-up    seen for Dr. Claiborne Billings.     History of Present Illness:    Jacob Quinn is a 64 y.o. male with a hx of CAD, HTN, HLD, RLS, rheumatic fever, and h/o syncope.  Patient had a NSTEMI in October 2017 and had DES to left circumflex marginal vessel, EF at the time of cath was 50%.  He had a 70% D1 and 85% small anterior RV marginal branch of RCA residual.  Myoview in October 2018 was low risk, EF 46%.  He did have a syncopal episode in 2019 attributed to combination of pain medication, hypertensive medication and dehydration.  His lisinopril was later cut back.  Echocardiogram performed in July 2019 showed preserved EF.  His last follow-up with Kerin Ransom was on 09/03/2018, at which time he was doing well.  Patient presents today for cardiology follow-up at the recommendation of his orthopedic doctor.  He was previously seen by Dr. Marcene Duos, however he recently saw another orthopedic doctor who noted significant lower extremity edema and urged him to see cardiology service.  He denies any orthopnea or PND.  On physical exam, he has at least 1-2+ pitting edema bilaterally.  He also had extensive bilateral knee replacement surgery as well.  He is on amlodipine 10 mg which can also contribute to the lower extremity edema.  Given his history of syncope, I am hesitant to start with strong diuretic such as Lasix, instead I recommended starting on the hydrochlorothiazide 25 mg daily.  His blood pressure is elevated today, hydrochlorothiazide will help.  He is no longer on metoprolol when compared to 2019, I am fine with this as his baseline heart rate is only 60 bpm.  He also admits to have some nighttime leg cramps as well.   About a week after starting on the hydrochlorothiazide, I plan to obtain a basic metabolic panel and a magnesium level.  He will also need echocardiogram to reassess the ejection fraction.  I plan to see him back in 4 weeks for reassessment.  Furthermore, he is no longer taking the Zetia and Repatha.  He has completely run out of of Zetia, I will renew this medication.  As far as Repatha, he says he used to get the [place] from [facility], however he has not received any further supplies since the end of January.  His last lipid panel obtained in January 2021 showed very well-controlled cholesterol, therefore recommended him to restart Repatha.  I suspect we need to renew the prior authorization for this year.  Past Medical History:  Diagnosis Date  . 1st degree AV block   . BPH associated with nocturia   . CAD in native artery    a. NSTEMI 07/2016 -  LHC 08/07/16 showing 70% D1, 20% mLAD, segmental 95% then 75% OM stenosis, 85% acute marg, normal LVEDP - received PTCA/DES to OM.  Marland Kitchen Encephalitis, viral   . Hyperlipidemia 08/07/2016  . Hypertension   . Ischemic cardiomyopathy    a. LHC 07/2016 - mild mid anterolateral focal hypocontractility felt to be due to the circumflex stenosis, EF 50%.  . Myocardial infarction ([place]) 2017  . OA (osteoarthritis)   . Obesity (  BMI 30-39.9) 08/07/2016  . Renal insufficiency    a. 2016 r/t dehydration/ACEI use.  Marland Kitchen Restless leg syndrome 09/16/2010  . Rheumatic fever   . Syncope 2009   associated with viral URI    Past Surgical History:  Procedure Laterality Date  . CARDIAC CATHETERIZATION N/A 08/07/2016   Procedure: Left Heart Cath and Coronary Angiography;  Surgeon: Troy Sine, MD;  Location: [place] [facility];  Service: Cardiovascular;  Laterality: N/A;  . CARDIAC CATHETERIZATION N/A 08/07/2016   Procedure: Coronary Stent Intervention;  Surgeon: Troy Sine, MD;  Location: [place] [facility];  Service: Cardiovascular;  Laterality: N/A;  .  COLONOSCOPY  05/21/2012  . COLONOSCOPY  12/29/2019  . POLYPECTOMY    . REPLACEMENT TOTAL KNEE     left and right-  5 total   . TONSILLECTOMY    . TOTAL KNEE REVISION Left 11/19/2017   Procedure: LEFT TOTAL KNEE REVISION;  Surgeon: Frederik Pear, MD;  Location: [place];  Service: Orthopedics;  Laterality: Left;  . TOTAL KNEE REVISION Right 04/22/2018   Procedure: RIGHT TOTAL KNEE REVISION;  Surgeon: Frederik Pear, MD;  Location: [facility];  Service: Orthopedics;  Laterality: Right;  block    Current Medications: Current Meds  Medication Sig  . amLODipine (NORVASC) 10 MG tablet Take 1 tablet (10 mg total) by mouth daily.  Marland Kitchen aspirin 81 MG tablet Take 81 mg by mouth daily.  Marland Kitchen ibuprofen (ADVIL) 800 MG tablet TAKE 1 TABLET (800 MG TOTAL) BY MOUTH DAILY AS NEEDED.  Marland Kitchen lisinopril (ZESTRIL) 20 MG tablet TAKE 1 TABLET BY MOUTH EVERY DAY  . Multiple Vitamin (MULTI VITAMIN DAILY) TABS Take 1 tablet by mouth daily.  . nitroGLYCERIN (NITROSTAT) 0.4 MG SL tablet Place 1 tablet (0.4 mg total) under the tongue every 5 (five) minutes as needed for chest pain. Up to 3 doses.  . tamsulosin (FLOMAX) 0.4 MG CAPS capsule TAKE 1 CAPSULE (0.4 MG TOTAL) BY MOUTH DAILY     Allergies:   Penicillins, Tizanidine, and Latex   Social History   Socioeconomic History  . Marital status: Single    Spouse name: Not on file  . Number of children: 3  . Years of education: Not on file  . Highest education level: Not on file  Occupational History  . Occupation: English as a second language teacher: [employer]    Comment: [name]  Tobacco Use  . Smoking status: Never Smoker  . Smokeless tobacco: Never Used  Substance and Sexual Activity  . Alcohol use: No  . Drug use: No  . Sexual activity: Yes  Other Topics Concern  . Not on file  Social History Narrative   Lives with daughter   Is retired -    Social Determinants of Radio broadcast assistant Strain:   . Difficulty of Paying Living Expenses:   Food  Insecurity:   . Worried About Charity fundraiser in the Last Year:   . Arboriculturist in the Last Year:   Transportation Needs:   . Film/video editor (Medical):   Marland Kitchen Lack of Transportation (Non-Medical):   Physical Activity:   . Days of Exercise per Week:   . Minutes of Exercise per Session:   Stress:   . Feeling of Stress :   Social Connections:   . Frequency of Communication with Friends and Family:   . Frequency of Social Gatherings with Friends and Family:   . Attends Religious Services:   .  Active Member of Clubs or Organizations:   . Attends Archivist Meetings:   Marland Kitchen Marital Status:      Family History: The patient's family history includes Dementia in his father; Depression in his brother; Heart disease in his mother; Hypertension in his brother, brother, and mother; Prostate cancer in his father; Stroke in his brother. There is no history of Colon cancer, Colon polyps, Esophageal cancer, Rectal cancer, or Stomach cancer.  ROS:   Please see the history of present illness.     All other systems reviewed and are negative.  EKGs/Labs/Other Studies Reviewed:    The following studies were reviewed today:  Echo 05/07/2018 LV EF: 60% -  65%   -------------------------------------------------------------------  Indications:   Syncope (R55).   -------------------------------------------------------------------  History:  PMH:  Syncope. Coronary artery disease. Ischemic  cardiomyopathy. PMH:  Myocardial infarction. Risk factors:  Hypertension. Obese. Dyslipidemia.   -------------------------------------------------------------------  Study Conclusions   - Left ventricle: The cavity size was normal. Systolic function was  normal. The estimated ejection fraction was in the range of 60%  to 65%. Wall motion was normal; there were no regional wall  motion abnormalities. Left ventricular diastolic function  parameters were normal.  - Aortic  valve: There was mild regurgitation.  - Left atrium: The atrium was mildly dilated.   EKG:  EKG is ordered today.  The ekg ordered today demonstrates normal sinus rhythm, no significant ST-T wave changes.  Recent Labs: 11/11/2019: ALT 12; BUN 12; Creatinine, Ser 1.23; Hemoglobin 12.6; Platelets 303.0; Potassium 4.1; Sodium 138; TSH 2.26  Recent Lipid Panel    Component Value Date/Time   CHOL 134 11/11/2019 0849   CHOL 131 10/10/2018 0834   TRIG 110.0 11/11/2019 0849   HDL 37.70 (L) 11/11/2019 0849   HDL 42 10/10/2018 0834   CHOLHDL 4 11/11/2019 0849   VLDL 22.0 11/11/2019 0849   LDLCALC 74 11/11/2019 0849   LDLCALC 71 10/10/2018 0834   LDLDIRECT 110.4 02/19/2007 0819    Physical Exam:    VS:  BP (!) 146/88   Pulse 60   Temp 97.7 F (36.5 C) Comment: Forehead  Ht 6\' 1"  (1.854 m)   Wt 270 lb 3.2 oz (122.6 kg)   SpO2 97%   BMI 35.65 kg/m     Wt Readings from Last 3 Encounters:  02/27/20 270 lb 3.2 oz (122.6 kg)  12/29/19 269 lb (122 kg)  12/22/19 269 lb (122 kg)     GEN:  Well nourished, well developed in no acute distress HEENT: Normal NECK: No JVD; No carotid bruits LYMPHATICS: No lymphadenopathy CARDIAC: RRR, no murmurs, rubs, gallops RESPIRATORY:  Clear to auscultation without rales, wheezing or rhonchi  ABDOMEN: Soft, non-tender, non-distended MUSCULOSKELETAL:  No edema; No deformity  SKIN: Warm and dry NEUROLOGIC:  Alert and oriented x 3 PSYCHIATRIC:  Normal affect   ASSESSMENT:    1. Leg swelling   2. CAD S/P percutaneous coronary angioplasty   3. Essential hypertension   4. Hyperlipidemia LDL goal <70   5. Syncope, unspecified syncope type   6. Leg cramps    PLAN:    In order of problems listed above:  1. Leg swelling: I recommended starting on low-dose hydrochlorothiazide 25 mg daily for both blood pressure control and the leg edema.  Given his prior history of syncope, I am hesitant to start on stronger diuretic.  I will bring him back in a few  weeks for reassessment.  Given the recent leg swelling, I will  obtain an echocardiogram as well.  2. Leg cramps: Obtain basic metabolic panel and magnesium.  3. CAD: Last cardiac catheterization was in October 2017 at which time he underwent DES to left circumflex marginal vessel.  He denies any recent chest discomfort  4. Hypertension: Blood pressure stable  5. Hyperlipidemia: Restart Repatha and Zetia  6. History of syncope: No recurrence since 2019   Medication Adjustments/Labs and Tests Ordered: Current medicines are reviewed at length with the patient today.  Concerns regarding medicines are outlined above.  Orders Placed This Encounter  Procedures  . Basic metabolic panel  . Magnesium  . EKG 12-Lead  . ECHOCARDIOGRAM COMPLETE   Meds ordered this encounter  Medications  . Evolocumab (REPATHA SURECLICK) [id number] MG/ML SOAJ    Sig: Inject 140 mg into the skin every 14 (fourteen) days.    Dispense:  6 pen    Refill:  3  . ezetimibe (ZETIA) 10 MG tablet    Sig: Take 1 tablet (10 mg total) by mouth daily.    Dispense:  90 tablet    Refill:  3  . hydrochlorothiazide (HYDRODIURIL) 25 MG tablet    Sig: Take 1 tablet (25 mg total) by mouth daily.    Dispense:  90 tablet    Refill:  3    Patient Instructions  Medication Instructions:   RESTART Zetia  RESTART Repatha   START HCTZ 25 mg daily *If you need a refill on your cardiac medications before your next appointment, please call your pharmacy*  Lab Work: Your physician recommends that you return for lab work in [place] If you have labs (blood work) drawn today and your tests are completely normal, you will receive your results only by: Marland Kitchen MyChart Message (if you have MyChart) OR . A paper copy in the mail If you have any lab test that is abnormal or we need to change your treatment, we will call you to review the results.  Testing/Procedures: Your physician has requested that you have an  echocardiogram. Echocardiography is a painless test that uses sound waves to create images of your heart. It provides your doctor with information about the size and shape of your heart and how well your heart's chambers and valves are working. This procedure takes approximately one hour. There are no restrictions for this procedure.   Please schedule for 2-3 weeks    Follow-Up: At [facility], you and your health needs are our priority.  As part of our continuing mission to provide you with exceptional heart care, we have created designated Provider Care Teams.  These Care Teams include your primary Cardiologist (physician) and Advanced Practice Providers (APPs -  Physician Assistants and Nurse Practitioners) who all work together to provide you with the care you need, when you need it.  Your next appointment:   4 week(s)  The format for your next appointment:   In Person  Provider:   Almyra Deforest, PA-C  Other Instructions      Signed, Almyra Deforest, [place]  02/29/2020 11:01 PM    [place]

## 2020-02-28 ENCOUNTER — Other Ambulatory Visit: Payer: Self-pay | Admitting: Surgical

## 2020-02-28 ENCOUNTER — Other Ambulatory Visit: Payer: Self-pay | Admitting: Adult Health

## 2020-02-28 DIAGNOSIS — I2510 Atherosclerotic heart disease of native coronary artery without angina pectoris: Secondary | ICD-10-CM

## 2020-02-28 DIAGNOSIS — Z9861 Coronary angioplasty status: Secondary | ICD-10-CM

## 2020-02-29 ENCOUNTER — Encounter: Payer: Self-pay | Admitting: Physician Assistant

## 2020-03-01 NOTE — Telephone Encounter (Signed)
Ok to rf? 

## 2020-03-02 NOTE — Telephone Encounter (Signed)
SENT TO THE PHARMACY BY E-SCRIBE. 

## 2020-03-22 ENCOUNTER — Ambulatory Visit (HOSPITAL_COMMUNITY): Payer: Medicare Other | Attending: Cardiology

## 2020-03-22 ENCOUNTER — Other Ambulatory Visit: Payer: Self-pay

## 2020-03-22 DIAGNOSIS — I2510 Atherosclerotic heart disease of native coronary artery without angina pectoris: Secondary | ICD-10-CM | POA: Insufficient documentation

## 2020-03-22 DIAGNOSIS — Z9861 Coronary angioplasty status: Secondary | ICD-10-CM | POA: Diagnosis not present

## 2020-03-22 DIAGNOSIS — I10 Essential (primary) hypertension: Secondary | ICD-10-CM | POA: Insufficient documentation

## 2020-03-25 ENCOUNTER — Ambulatory Visit: Payer: Medicare Other | Admitting: Physician Assistant

## 2020-03-26 ENCOUNTER — Ambulatory Visit: Payer: Medicare Other | Admitting: Cardiovascular Disease

## 2020-03-26 ENCOUNTER — Other Ambulatory Visit: Payer: Self-pay

## 2020-03-26 ENCOUNTER — Encounter: Payer: Self-pay | Admitting: Cardiovascular Disease

## 2020-03-26 DIAGNOSIS — E785 Hyperlipidemia, unspecified: Secondary | ICD-10-CM

## 2020-03-26 DIAGNOSIS — I10 Essential (primary) hypertension: Secondary | ICD-10-CM | POA: Diagnosis not present

## 2020-03-26 DIAGNOSIS — Z9861 Coronary angioplasty status: Secondary | ICD-10-CM

## 2020-03-26 DIAGNOSIS — M7989 Other specified soft tissue disorders: Secondary | ICD-10-CM | POA: Diagnosis not present

## 2020-03-26 DIAGNOSIS — I2510 Atherosclerotic heart disease of native coronary artery without angina pectoris: Secondary | ICD-10-CM

## 2020-03-26 DIAGNOSIS — I252 Old myocardial infarction: Secondary | ICD-10-CM

## 2020-03-26 MED ORDER — FUROSEMIDE 20 MG PO TABS
20.0000 mg | ORAL_TABLET | Freq: Every day | ORAL | 3 refills | Status: DC
Start: 2020-03-26 — End: 2020-12-08

## 2020-03-26 NOTE — Progress Notes (Signed)
Cardiology Office Note    Date:  03/28/2020   ID:  Jacob, Quinn 1956-07-12, MRN [id number]  PCP:  Dorothyann Peng, NP  Cardiologist:  Shelva Majestic, MD   31 month F/U office visit  History of Present Illness:  Jacob Quinn is a 64 y.o. male who was admitted in the early morning 08/07/2016 with unstable angina.  He previously had experienced recurrent episodes of chest pressure and prior to his admission to the hospital was told of having reflux.  He was brought to the cardiac catheterization laboratory and catheterization by me revealed three-vessel CAD with 70% ostial stenosis in the first diagonal branch of the LAD with 20% mid LAD stenoses, segmental 95 and 70% circumflex marginal stenoses, and 85% stenosis in a small anterior RV marginal branch of the RCA.  He underwent successful PCI to the circumflex marginal vessel with ultimate insertion of a 3.022 mm Resolute DES stent postdilated with stent taper from 325-3.05 and with the segmental 95 and 75% stenoses being reduced to 0%.  He was seen by Rosaria Ferries 08/24/2016 for post hospital follow-up.  At that time was stable on aspirin/Brilinta for dual antiplatelet therapy.  His blood pressure was elevated and he was on amlodipine 10 mg, lisinopril 20 mg in addition to atorvastatin 80 mg.  He never presented for follow-up to see me for subsequent office visit until April 2018.   He is retired and previously had worked at [employer].  He denies PND, orthopnea.  He had undergone laboratory on 02/06/2017 which showed a total cholesterol 195, LDL cholesterol 133, HDL 36, triglycerides 126.  He had normal renal function with a potassium of 4.4, and creatinine 1.01.  Thyroid function studies were normal.    When I initially saw him, he was hypertensive despite being on amlodipine 10 mg of lisinopril 31 g.  I further titrated lisinopril 40 mg and added spironolactone 12.5 Mill grams twice a day.  He also continue to have hyperlipidemia  despite taking atorvastatin 80 mg and I added Zetia 10 mg to his medical regimen.   I last saw him in October 2018 at which time he continued  to be without chest pain or shortness of breath.  He is in need for knee surgery with Dr. Alphonzo Severance.  He underwent a nuclear perfusion study on 08/15/2017.  This was low risk, but showed an EF of 46% without ECG changes and without evidence for scar or ishemia.  There was mild LV enlargement.  He denied palpitations, PND orthopnea.  During that evaluation, blood pressure was elevated despite taking lisinopril 40 mg and amlodipine 10 mg and he had run out of spironolactone.  At that time I added metoprolol succinate 25 mg daily to his regimen and gave him clearance to undergo knee surgery.  Over the last several years, he has done well.  He had knee surgery in 2018 and more recently in 2019.  Presently he continues to be on amlodipine 10 mg,  and lisinopril 20 mg daily.  It does not appear that he is on beta-blocker therapy.  He has been on atorvastatin 80 mg and Zetia 10 mg and remotely had been on Repatha but has been off therapy for a year.  He was seen by Almyra Deforest, PA-C in April 2021 at which time he had run out of his Zetia and still was not back on Repatha.  He had significant leg swelling and was started on HCTZ 25 mg  daily.    He was referred for an echo Doppler study which was done on Mar 22, 2020 which showed an EF of 60 to 65%, mild concentric LVH, and he had normal diastolic parameters.  Estimated RV pressure was 28 mm.  There was mild aortic regurgitation.  Aortic root was mildly dilated at 40 mm.  Presently, he has felt well.  He continues to have leg swelling despite taking HCTZ.  He presents for reevaluation.   Past Medical History:  Diagnosis Date  . 1st degree AV block   . BPH associated with nocturia   . CAD in native artery    a. NSTEMI 07/2016 -  LHC 08/07/16 showing 70% D1, 20% mLAD, segmental 95% then 75% OM stenosis, 85% acute marg,  normal LVEDP - received PTCA/DES to OM.  Marland Kitchen Encephalitis, viral   . Hyperlipidemia 08/07/2016  . Hypertension   . Ischemic cardiomyopathy    a. LHC 07/2016 - mild mid anterolateral focal hypocontractility felt to be due to the circumflex stenosis, EF 50%.  . Myocardial infarction ([place]) 2017  . OA (osteoarthritis)   . Obesity (BMI 30-39.9) 08/07/2016  . Renal insufficiency    a. 2016 r/t dehydration/ACEI use.  Marland Kitchen Restless leg syndrome 09/16/2010  . Rheumatic fever   . Syncope 2009   associated with viral URI    Past Surgical History:  Procedure Laterality Date  . CARDIAC CATHETERIZATION N/A 08/07/2016   Procedure: Left Heart Cath and Coronary Angiography;  Surgeon: Troy Sine, MD;  Location: [facility];  Service: Cardiovascular;  Laterality: N/A;  . CARDIAC CATHETERIZATION N/A 08/07/2016   Procedure: Coronary Stent Intervention;  Surgeon: Troy Sine, MD;  Location: [facility];  Service: Cardiovascular;  Laterality: N/A;  . COLONOSCOPY  05/21/2012  . COLONOSCOPY  12/29/2019  . POLYPECTOMY    . REPLACEMENT TOTAL KNEE     left and right-  5 total   . TONSILLECTOMY    . TOTAL KNEE REVISION Left 11/19/2017   Procedure: LEFT TOTAL KNEE REVISION;  Surgeon: Frederik Pear, MD;  Location: [facility];  Service: Orthopedics;  Laterality: Left;  . TOTAL KNEE REVISION Right 04/22/2018   Procedure: RIGHT TOTAL KNEE REVISION;  Surgeon: Frederik Pear, MD;  Location: [facility];  Service: Orthopedics;  Laterality: Right;  block    Current Medications: Outpatient Medications Prior to Visit  Medication Sig Dispense Refill  . amLODipine (NORVASC) 10 MG tablet Take 1 tablet (10 mg total) by mouth daily. 90 tablet 2  . aspirin 81 MG tablet Take 81 mg by mouth daily.    Marland Kitchen atorvastatin (LIPITOR) 80 MG tablet TAKE 1 TABLET BY MOUTH EVERY DAY 90 tablet 2  . Evolocumab (REPATHA SURECLICK) 638 MG/ML SOAJ Inject 140 mg into the skin every 14 (fourteen) days. 6 pen 3  . ezetimibe (ZETIA) 10 MG tablet  Take 1 tablet (10 mg total) by mouth daily. 90 tablet 3  . ibuprofen (ADVIL) 800 MG tablet TAKE 1 TABLET (800 MG TOTAL) BY MOUTH DAILY AS NEEDED. 30 tablet 1  . lisinopril (ZESTRIL) 20 MG tablet TAKE 1 TABLET BY MOUTH EVERY DAY 90 tablet 2  . Multiple Vitamin (MULTI VITAMIN DAILY) TABS Take 1 tablet by mouth daily.    . nitroGLYCERIN (NITROSTAT) 0.4 MG SL tablet Place 1 tablet (0.4 mg total) under the tongue every 5 (five) minutes as needed for chest pain. Up to 3 doses. 25 tablet 3  . tamsulosin (FLOMAX) 0.4 MG CAPS capsule TAKE 1  CAPSULE (0.4 MG TOTAL) BY MOUTH DAILY 90 capsule 3  . hydrochlorothiazide (HYDRODIURIL) 25 MG tablet Take 1 tablet (25 mg total) by mouth daily. 90 tablet 3   No facility-administered medications prior to visit.     Allergies:   Penicillins, Tizanidine, and Latex   Social History   Socioeconomic History  . Marital status: Single    Spouse name: Not on file  . Number of children: 3  . Years of education: Not on file  . Highest education level: Not on file  Occupational History  . Occupation: English as a second language teacher: [employer]    Comment: [name]  Tobacco Use  . Smoking status: Never Smoker  . Smokeless tobacco: Never Used  Substance and Sexual Activity  . Alcohol use: No  . Drug use: No  . Sexual activity: Yes  Other Topics Concern  . Not on file  Social History Narrative   Lives with daughter   Is retired -    Social Determinants of Radio broadcast assistant Strain:   . Difficulty of Paying Living Expenses:   Food Insecurity:   . Worried About Charity fundraiser in the Last Year:   . Arboriculturist in the Last Year:   Transportation Needs:   . Film/video editor (Medical):   Marland Kitchen Lack of Transportation (Non-Medical):   Physical Activity:   . Days of Exercise per Week:   . Minutes of Exercise per Session:   Stress:   . Feeling of Stress :   Social Connections:   . Frequency of Communication with Friends and Family:    . Frequency of Social Gatherings with Friends and Family:   . Attends Religious Services:   . Active Member of Clubs or Organizations:   . Attends Archivist Meetings:   Marland Kitchen Marital Status:      Family History:  The patient's family history includes Dementia in his father; Depression in his brother; Heart disease in his mother; Hypertension in his brother, brother, and mother; Prostate cancer in his father; Stroke in his brother.   ROS General: Negative; No fevers, chills, or night sweats;  HEENT: Negative; No changes in vision or hearing, sinus congestion, difficulty swallowing Pulmonary: Negative; No cough, wheezing, shortness of breath, hemoptysis Cardiovascular:  See HPI GI: Negative; No nausea, vomiting, diarrhea, or abdominal pain GU: Negative; No dysuria, hematuria, or difficulty voiding Musculoskeletal: Negative; no myalgias, joint pain, or weakness Hematologic/Oncology: Negative; no easy bruising, bleeding Endocrine: Negative; no heat/cold intolerance; no diabetes Neuro: Negative; no changes in balance, headaches Skin: Negative; No rashes or skin lesions Psychiatric: Negative; No behavioral problems, depression Sleep: Negative; No snoring, daytime sleepiness, hypersomnolence, bruxism, restless legs, hypnogognic hallucinations, no cataplexy Other comprehensive 14 point system review is negative.   PHYSICAL EXAM:   VS:  BP (!) 156/92   Pulse 67   Ht _0  (1.854 m)   Wt 268 lb (121.6 kg)   SpO2 97%   BMI 35.36 kg/m     Repeat blood pressure by me was 140/80  Wt Readings from Last 3 Encounters:  03/26/20 268 lb (121.6 kg)  02/27/20 270 lb 3.2 oz (122.6 kg)  12/29/19 269 lb (122 kg)    General: Alert, oriented, no distress.  Skin: normal turgor, no rashes, warm and dry HEENT: Normocephalic, atraumatic. Pupils equal round and reactive to light; sclera anicteric; extraocular muscles intact;  Nose without nasal septal hypertrophy Mouth/Parynx benign;  Mallinpatti scale 3 Neck:  No JVD, no carotid bruits; normal carotid upstroke Lungs: clear to ausculatation and percussion; no wheezing or rales Chest wall: without tenderness to palpitation Heart: PMI not displaced, RRR, s1 s2 normal, 1/6 systolic murmur, no diastolic murmur, no rubs, gallops, thrills, or heaves Abdomen: soft, nontender; no hepatosplenomehaly, BS+; abdominal aorta nontender and not dilated by palpation. Back: no CVA tenderness Pulses 2+ Musculoskeletal: full range of motion, normal strength, no joint deformities Extremities: no clubbing cyanosis or edema, Homan's sign negative  Neurologic: grossly nonfocal; Cranial nerves grossly wnl Psychologic: Normal mood and affect   Studies/Labs Reviewed:   ECG (independently read by me): Not done today, but I personally reviewed the last ECG from February 27, 2020 which shows sinus rhythm at 60 bpm with first-degree AV block with a PR interval of 250 ms; borderline LVH by voltage  October 2018 ECG (independently read by me): Normal sinus rhythm with first-degree AV block with a PR interval 222 ms.  No ectopy.  Borderline LVH by voltage criteria.  May 2018 ECG (independently read by me): Sinus bradycardia 58 bpm.  First degree AV block with PR interval 250 ms.  Borderline voltage criteria for LVH.  No ST segment changes.  QTc interval 370 ms.  Recent Labs: BMP Latest Ref Rng & Units 11/11/2019 04/30/2018 04/23/2018  Glucose 70 - 99 mg/dL 112(H) 98 148(H)  BUN 6 - 23 mg/dL _0 Creatinine 0.40 - 1.50 mg/dL 1.23 1.16 1.00  BUN/Creat Ratio 10 - 24 - - -  Sodium 135 - 145 mEq/L 138 134(L) 137  Potassium 3.5 - 5.1 mEq/L 4.1 4.6 4.8  Chloride 96 - 112 mEq/L 102 97 105  CO2 19 - 32 mEq/L _1 Calcium 8.4 - 10.5 mg/dL 9.4 9.4 8.5(L)     Hepatic Function Latest Ref Rng & Units 11/11/2019 11/30/2017 11/22/2017  Total Protein 6.0 - 8.3 g/dL 7.2 7.8 7.2  Albumin 3.5 - 5.2 g/dL 4.2 4.0 3.3(L)  AST 0 - 37 U/L _2 ALT 0 - 53 U/L 12  35 20  Alk Phosphatase 39 - 117 U/L 100 93 91  Total Bilirubin 0.2 - 1.2 mg/dL 0.5 0.5 0.8  Bilirubin, Direct 0.0 - 0.3 mg/dL - - -    CBC Latest Ref Rng & Units 11/11/2019 05/21/2018 04/30/2018  WBC 4.0 - 10.5 K/uL 10.0 8.4 11.7(H)  Hemoglobin 13.0 - 17.0 g/dL 12.6(L) 11.9(L) 10.4(L)  Hematocrit 39.0 - 52.0 % 38.1(L) 36.6(L) 32.1(L)  Platelets 150.0 - 400.0 K/uL 303.0 390.0 482.0(H)   Lab Results  Component Value Date   MCV 88.6 11/11/2019   MCV 86.4 05/21/2018   MCV 85.1 04/30/2018   Lab Results  Component Value Date   TSH 2.26 11/11/2019   Lab Results  Component Value Date   HGBA1C 5.7 (H) 04/22/2018     BNP No results found for: BNP  ProBNP No results found for: PROBNP   Lipid Panel     Component Value Date/Time   CHOL 134 11/11/2019 0849   CHOL 131 10/10/2018 0834   TRIG 110.0 11/11/2019 0849   HDL 37.70 (L) 11/11/2019 0849   HDL 42 10/10/2018 0834   CHOLHDL 4 11/11/2019 0849   VLDL 22.0 11/11/2019 0849   LDLCALC 74 11/11/2019 0849   LDLCALC 71 10/10/2018 0834   LDLDIRECT 110.4 02/19/2007 0819     RADIOLOGY: ECHOCARDIOGRAM COMPLETE  Result Date: 03/22/2020    ECHOCARDIOGRAM REPORT   Patient Name:   Jacob Quinn Date of  Exam: 03/22/2020 Medical Rec #:  [id number]         Height:       73.0 in Accession #:    [id number]        Weight:       270.2 lb Date of Birth:  10-18-1956         BSA:          2.445 m Patient Age:    33 years          BP:           176/110 mmHg Patient Gender: M                 HR:           60 bpm. Exam Location:  [facility] Procedure: 2D Echo, Cardiac Doppler and Color Doppler Indications:    I25.10 CAD  History:        Patient has prior history of Echocardiogram examinations, most                 recent 05/07/2018. Signs/Symptoms:Syncope; Risk                 Factors:Hypertension and HLD.  Sonographer:    Marygrace Drought RCS Referring Phys: [phone] HAO MENG IMPRESSIONS  1. Left ventricular ejection fraction, by estimation, is 60 to 65%.  The left ventricle has normal function. The left ventricle has no regional wall motion abnormalities. There is mild concentric left ventricular hypertrophy. Left ventricular diastolic parameters were normal.  2. Right ventricular systolic function is normal. The right ventricular size is normal. There is normal pulmonary artery systolic pressure. The estimated right ventricular systolic pressure is 00.8 mmHg.  3. The mitral valve is normal in structure. Mild mitral valve regurgitation. No evidence of mitral stenosis.  4. The aortic valve is tricuspid. Aortic valve regurgitation is mild. Mild aortic valve sclerosis is present, with no evidence of aortic valve stenosis. Aortic regurgitation PHT measures 646 msec.  5. Aortic dilatation noted. There is mild dilatation of the aortic root and of the ascending aorta measuring 40 mm and 68m respectively.  6. The inferior vena cava is dilated in size with >50% respiratory variability, suggesting right atrial pressure of 8 mmHg. FINDINGS  Left Ventricle: Left ventricular ejection fraction, by estimation, is 60 to 65%. The left ventricle has normal function. The left ventricle has no regional wall motion abnormalities. The left ventricular internal cavity size was normal in size. There is  mild concentric left ventricular hypertrophy. Left ventricular diastolic parameters were normal. Normal left ventricular filling pressure. Right Ventricle: The right ventricular size is normal. No increase in right ventricular wall thickness. Right ventricular systolic function is normal. There is normal pulmonary artery systolic pressure. The tricuspid regurgitant velocity is 2.25 m/s, and  with an assumed right atrial pressure of 8 mmHg, the estimated right ventricular systolic pressure is 267.6mmHg. Left Atrium: Left atrial size was normal in size. Right Atrium: Right atrial size was normal in size. Pericardium: There is no evidence of pericardial effusion. Mitral Valve: The mitral valve  is normal in structure. Normal mobility of the mitral valve leaflets. Mild mitral valve regurgitation. No evidence of mitral valve stenosis. Tricuspid Valve: The tricuspid valve is normal in structure. Tricuspid valve regurgitation is trivial. No evidence of tricuspid stenosis. Aortic Valve: The aortic valve is tricuspid. Aortic valve regurgitation is mild. Aortic regurgitation PHT measures 646 msec. Mild aortic valve sclerosis is present, with no evidence of aortic  valve stenosis. Pulmonic Valve: The pulmonic valve was normal in structure. Pulmonic valve regurgitation is not visualized. No evidence of pulmonic stenosis. Aorta: Aortic dilatation noted. There is mild dilatation of the aortic root and of the ascending aorta measuring 40 mm. Venous: The inferior vena cava is dilated in size with greater than 50% respiratory variability, suggesting right atrial pressure of 8 mmHg. IAS/Shunts: No atrial level shunt detected by color flow Doppler.  LEFT VENTRICLE PLAX 2D LVIDd:         4.55 cm  Diastology LVIDs:         3.12 cm  LV e' lateral:   9.36 cm/s LV PW:         1.23 cm  LV E/e' lateral: 8.9 LV IVS:        1.29 cm  LV e' medial:    9.03 cm/s LVOT diam:     2.40 cm  LV E/e' medial:  9.2 LV SV:         81 LV SV Index:   33 LVOT Area:     4.52 cm  RIGHT VENTRICLE RV Basal diam:  3.99 cm RV S prime:     11.70 cm/s TAPSE (M-mode): 2.4 cm RVSP:           23.2 mmHg LEFT ATRIUM             Index       RIGHT ATRIUM           Index LA diam:        4.20 cm 1.72 cm/m  RA Pressure: 3.00 mmHg LA Vol (A2C):   84.6 ml 34.60 ml/m RA Area:     20.00 cm LA Vol (A4C):   79.1 ml 32.35 ml/m RA Volume:   65.90 ml  26.95 ml/m LA Biplane Vol: 81.6 ml 33.37 ml/m  AORTIC VALVE LVOT Vmax:   91.60 cm/s LVOT Vmean:  55.500 cm/s LVOT VTI:    0.178 m AI PHT:      646 msec  AORTA Ao Root diam: 4.00 cm MITRAL VALVE               TRICUSPID VALVE MV Area (PHT):             TR Peak grad:   20.2 mmHg MV Decel Time:             TR Vmax:         225.00 cm/s MV E velocity: 83.40 cm/s  Estimated RAP:  3.00 mmHg MV A velocity: 53.80 cm/s  RVSP:           23.2 mmHg MV E/A ratio:  1.55                            SHUNTS                            Systemic VTI:  0.18 m                            Systemic Diam: 2.40 cm Fransico Him MD Electronically signed by Fransico Him MD Signature Date/Time: 03/22/2020/10:38:05 AM    Final      Additional studies/ records that were reviewed today include:  I reviewed the patient's October 2017 hospitalization, catheterization report, subsequent office visit with Rosaria Ferries and recent laboratory.   [facility] SPECT Myoview  08/15/2017 Study Highlights     The left ventricular ejection fraction is mildly decreased (45-54%).  Nuclear stress EF: 46%.  There was no ST segment deviation noted during stress.  This is a low risk study.   Low risk stress nuclear study with no ischemia or infarction; EF 46 with mild global hypokinesis; mild LVE.    ECHO: 03/22/2020 IMPRESSIONS  1. Left ventricular ejection fraction, by estimation, is 60 to 65%. The  left ventricle has normal function. The left ventricle has no regional  wall motion abnormalities. There is mild concentric left ventricular  hypertrophy. Left ventricular diastolic  parameters were normal.  2. Right ventricular systolic function is normal. The right ventricular  size is normal. There is normal pulmonary artery systolic pressure. The  estimated right ventricular systolic pressure is 34.1 mmHg.  3. The mitral valve is normal in structure. Mild mitral valve  regurgitation. No evidence of mitral stenosis.  4. The aortic valve is tricuspid. Aortic valve regurgitation is mild.  Mild aortic valve sclerosis is present, with no evidence of aortic valve  stenosis. Aortic regurgitation PHT measures 646 msec.  5. Aortic dilatation noted. There is mild dilatation of the aortic root  and of the ascending aorta measuring 40 mm and 31m respectively.   6. The inferior vena cava is dilated in size with >50% respiratory  variability, suggesting right atrial pressure of 8 mmHg.     ASSESSMENT:    1. CAD S/P percutaneous coronary angioplasty   2. Essential hypertension   3. Leg swelling   4. Hyperlipidemia LDL goal <70    PLAN:  Jacob Quinn a 64year old African-American male who presented with unstable angina and ruled in for non-ST segment elevation myocardial infarction on 08/07/2016.  He was found to have multivessel CAD  and underwent successful stenting to his high grade segmental circumflex marginal stenoses.  He has been without anginal symptomatology since his intervention.  He has been maintained on amlodipine 10 mg and lisinopril 30 mg for hypertension , and despite therapy when I initially saw him, had stage II hypertension.  When I saw him in 2018 lisinopril had been increased to 40 milligrams and beta-blocker therapy with metoprolol 25 mg was added.  In the past he continued to have hyperlipidemia despite atorvastatin 80 mg and Zetia and Repatha was instituted.  He apparently has been off Repatha for at least a year.  Blood pressure today was elevated initially by the nurse at 156/92 and read by me was 140/80.  Apparently he is now only on amlodipine 10 mg and lisinopril 20 mg and is no longer on beta-blocker but has been on HCTZ 25 mg.  With his continued leg swelling I am changing his diuretic to Lasix 20 mg daily.  He has not had recent laboratory checking chemistry profile and fasting lipid panel.  Adjustments to his medical regimen will be made as needed.  If LDL continues to be elevated reinstitution of Repatha will be necessary.  He is status post bilateral knee replacements.  He tolerated surgery well.  I will see him in 4 to 6 months for follow-up evaluation     Medication Adjustments/Labs and Tests Ordered: Current medicines are reviewed at length with the patient today.  Concerns regarding medicines are outlined  above.  Medication changes, Labs and Tests ordered today are listed in the Patient Instructions below.  Patient Instructions  Medication Instructions:  STOP HCTZ  START LASIX 20MG DAILY- MAY TAKE ADDITIONAL DOSE FOR ADDITIONAL  SWELLING  *If you need a refill on your cardiac medications before your next appointment, please call your pharmacy*   Lab Work: NEXT MONTH FASTING LABS: LIPID CMET If you have labs (blood work) drawn today and your tests are completely normal, you will receive your results only by: Marland Kitchen MyChart Message (if you have MyChart) OR . A paper copy in the mail If you have any lab test that is abnormal or we need to change your treatment, we will call you to review the results.   Follow-Up: At [facility], you and your health needs are our priority.  As part of our continuing mission to provide you with exceptional heart care, we have created designated Provider Care Teams.  These Care Teams include your primary Cardiologist (physician) and Advanced Practice Providers (APPs -  Physician Assistants and Nurse Practitioners) who all work together to provide you with the care you need, when you need it.  We recommend signing up for the patient portal called "MyChart".  Sign up information is provided on this After Visit Summary.  MyChart is used to connect with patients for Virtual Visits (Telemedicine).  Patients are able to view lab/test results, encounter notes, upcoming appointments, etc.  Non-urgent messages can be sent to your provider as well.   To learn more about what you can do with MyChart, go to [email or web address].    Your next appointment:   6 month(s)  The format for your next appointment:   In Person  Provider:   Shelva Majestic, MD        Signed, Shelva Majestic, MD  03/28/2020 5:55 PM    [facility] [address], [place], [place], [place]  [address] Phone: [phone]

## 2020-03-26 NOTE — Patient Instructions (Signed)
Medication Instructions:  STOP HCTZ  START LASIX 20MG  DAILY- MAY TAKE ADDITIONAL DOSE FOR ADDITIONAL SWELLING  *If you need a refill on your cardiac medications before your next appointment, please call your pharmacy*   Lab Work: NEXT MONTH FASTING LABS: LIPID CMET If you have labs (blood work) drawn today and your tests are completely normal, you will receive your results only by: Marland Kitchen MyChart Message (if you have MyChart) OR . A paper copy in the mail If you have any lab test that is abnormal or we need to change your treatment, we will call you to review the results.   Follow-Up: At [facility], you and your health needs are our priority.  As part of our continuing mission to provide you with exceptional heart care, we have created designated Provider Care Teams.  These Care Teams include your primary Cardiologist (physician) and Advanced Practice Providers (APPs -  Physician Assistants and Nurse Practitioners) who all work together to provide you with the care you need, when you need it.  We recommend signing up for the patient portal called "MyChart".  Sign up information is provided on this After Visit Summary.  MyChart is used to connect with patients for Virtual Visits (Telemedicine).  Patients are able to view lab/test results, encounter notes, upcoming appointments, etc.  Non-urgent messages can be sent to your provider as well.   To learn more about what you can do with MyChart, go to [email or web address].    Your next appointment:   6 month(s)  The format for your next appointment:   In Person  Provider:   Shelva Majestic, MD

## 2020-03-28 ENCOUNTER — Encounter: Payer: Self-pay | Admitting: Cardiovascular Disease

## 2020-04-05 ENCOUNTER — Telehealth: Payer: Self-pay | Admitting: Physical Medicine and Rehabilitation

## 2020-04-05 NOTE — Telephone Encounter (Signed)
Patient called.   He is wanting to set up an appointment with Dr. Ernestina Patches   Call back: [phone]

## 2020-04-06 ENCOUNTER — Telehealth: Payer: Self-pay | Admitting: Physical Medicine and Rehabilitation

## 2020-04-06 NOTE — Telephone Encounter (Signed)
Received call from patient advised he was returning call and asked for a call back. The number to contact patient is [phone]

## 2020-04-06 NOTE — Telephone Encounter (Signed)
Called pt vm is not set up, unable to lvm

## 2020-04-06 NOTE — Telephone Encounter (Signed)
Ok but would do brief eval first (ov plus injeciton ok)and let patient we could change our mind at eval but I would talk to him etc.

## 2020-04-06 NOTE — Telephone Encounter (Signed)
Pt  had Lt L3-4, L4-5 TF on 07/02/18, pt states last injection helped out a lot and would like to have a repeat. Pt states no new injuries/traumas. Ok to schedule?

## 2020-04-07 NOTE — Telephone Encounter (Signed)
Pt is scheduled with driver 5/39/67

## 2020-04-07 NOTE — Telephone Encounter (Signed)
[address]483, Injection(s), anesthetic agent and/or more  Notification/Prior Authorization not required if procedure performed in Office; otherwise may be required for this service.

## 2020-04-14 ENCOUNTER — Telehealth: Payer: Self-pay

## 2020-04-14 ENCOUNTER — Other Ambulatory Visit: Payer: Self-pay | Admitting: Surgical

## 2020-04-14 ENCOUNTER — Encounter: Payer: Self-pay | Admitting: Orthopedic Surgery

## 2020-04-14 ENCOUNTER — Ambulatory Visit: Payer: Medicare Other | Admitting: Orthopedic Surgery

## 2020-04-14 DIAGNOSIS — M25562 Pain in left knee: Secondary | ICD-10-CM

## 2020-04-14 DIAGNOSIS — G8929 Other chronic pain: Secondary | ICD-10-CM

## 2020-04-14 DIAGNOSIS — M25561 Pain in right knee: Secondary | ICD-10-CM

## 2020-04-14 MED ORDER — OXYCODONE-ACETAMINOPHEN 5-325 MG PO TABS: 1.0000 | ORAL_TABLET | Freq: Two times a day (BID) | ORAL | 0 refills | Status: DC | PRN

## 2020-04-14 NOTE — Telephone Encounter (Signed)
submitted

## 2020-04-14 NOTE — Telephone Encounter (Signed)
Patient seen by Dr Marlou Sa today. He would like rx for Percocet 5/325 sent in [facility] to [facility] po bid prn #45 no refills

## 2020-04-14 NOTE — Progress Notes (Signed)
Office Visit Note   Patient: Jacob Quinn           Date of Birth: 10/24/1956           MRN: [id number] Visit Date: 04/14/2020 Requested by: Dorothyann Peng, NP [address] [place],  [place] [address] PCP: Dorothyann Peng, NP  Subjective: Chief Complaint  Patient presents with  . Left Knee - Pain  . Right Knee - Pain    HPI: Jacob Quinn is a patient with bilateral knee pain right equal to left.  He has had right unicompartmental knee revised as well as left knee revision x2.  Denies any fevers or chills.  Reports generally constant chronic pain but he is retired.  Radiographs from January office visit are on the system.  No real worsening of symptoms since that time.  He does have epidural steroid injection scheduled for July.  Takes ibuprofen but that is not really helping with the pain currently.              ROS: All systems reviewed are negative as they relate to the chief complaint within the history of present illness.  Patient denies  fevers or chills.   Assessment & Plan: Visit Diagnoses:  1. Chronic pain of both knees     Plan: Impression is bilateral knee pain.  No effusion or warmth to either knee today.  Both knees are about as functional as they were before.  Has slight amount of laxity in the left knee but not thing unexpected compared to the revision history that he has.  Had.  Plan is one-time prescription for pain medicine.  Continue with nonweightbearing quad strengthening exercises.  Follow-up with me as needed.  He cannot really have any more surgery on that left knee.  The right knee has good range of motion to about 90 degrees of flexion and no effusion.  I would not really do too much with the right knee at this time.  Follow-Up Instructions: Return if symptoms worsen or fail to improve.   Orders:  No orders of the defined types were placed in this encounter.  No orders of the defined types were placed in this encounter.     Procedures: No  procedures performed   Clinical Data: No additional findings.  Objective: Vital Signs: There were no vitals taken for this visit.  Physical Exam:   Constitutional: Patient appears well-developed HEENT:  Head: Normocephalic Eyes:EOM are normal Neck: Normal range of motion Cardiovascular: Normal rate Pulmonary/chest: Effort normal Neurologic: Patient is alert Skin: Skin is warm Psychiatric: Patient has normal mood and affect    Ortho Exam: Ortho exam demonstrates antalgic gait to the left.  Pedal pulses palpable.  He has about 80 to 90 degrees of flexion in both knees.  He has extension with about a 10 degree lag on the left and no leg on the right.  No groin pain with internal X rotation of the leg.  No definite nerve root tension signs or paresthesias in the legs at this time.  Mild back pain to palpation.  Specialty Comments:  No specialty comments available.  Imaging: No results found.   PMFS History: Patient Active Problem List   Diagnosis Date Noted  . Syncope and collapse 05/01/2018  . Primary osteoarthritis of right knee 04/22/2018  . Pain due to unicompartmental arthroplasty of knee ([place]) 04/19/2018  . Post op infection 11/22/2017  . Primary osteoarthritis of left knee 11/19/2017  . 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 Presence of left artificial knee joint 12/20/2016  . Presence of right artificial knee joint 12/20/2016  . Essential hypertension 08/08/2016  . Cardiomyopathy, ischemic 08/08/2016  . History of NSTEMI 08/07/2016  . Obesity (BMI 30-39.9) 08/07/2016  . Dyslipidemia, goal LDL below 70 08/07/2016  . CAD S/P percutaneous coronary angioplasty 08/07/2016  . Restless leg syndrome 09/16/2010  . Osteoarthritis 11/22/2007   Past Medical History:  Diagnosis Date  . 1st degree AV block   . BPH associated with nocturia   . CAD in native artery    a. NSTEMI 07/2016 -  LHC 08/07/16 showing 70% D1, 20% mLAD, segmental 95% then 75% OM  stenosis, 85% acute marg, normal LVEDP - received PTCA/DES to OM.  Marland Kitchen Encephalitis, viral   . Hyperlipidemia 08/07/2016  . Hypertension   . Ischemic cardiomyopathy    a. LHC 07/2016 - mild mid anterolateral focal hypocontractility felt to be due to the circumflex stenosis, EF 50%.  . Myocardial infarction ([place]) 2017  . OA (osteoarthritis)   . Obesity (BMI 30-39.9) 08/07/2016  . Renal insufficiency    a. 2016 r/t dehydration/ACEI use.  Marland Kitchen Restless leg syndrome 09/16/2010  . Rheumatic fever   . Syncope 2009   associated with viral URI    Family History  Problem Relation Age of Onset  . Hypertension Mother   . Heart disease Mother   . Prostate cancer Father   . Dementia Father   . Depression Brother   . Hypertension Brother   . Hypertension Brother   . Stroke Brother   . Colon cancer Neg Hx   . Colon polyps Neg Hx   . Esophageal cancer Neg Hx   . Rectal cancer Neg Hx   . Stomach cancer Neg Hx     Past Surgical History:  Procedure Laterality Date  . CARDIAC CATHETERIZATION N/A 08/07/2016   Procedure: Left Heart Cath and Coronary Angiography;  Surgeon: Troy Sine, MD;  Location: [facility];  Service: Cardiovascular;  Laterality: N/A;  . CARDIAC CATHETERIZATION N/A 08/07/2016   Procedure: Coronary Stent Intervention;  Surgeon: Troy Sine, MD;  Location: [facility];  Service: Cardiovascular;  Laterality: N/A;  . COLONOSCOPY  05/21/2012  . COLONOSCOPY  12/29/2019  . POLYPECTOMY    . REPLACEMENT TOTAL KNEE     left and right-  5 total   . TONSILLECTOMY    . TOTAL KNEE REVISION Left 11/19/2017   Procedure: LEFT TOTAL KNEE REVISION;  Surgeon: Frederik Pear, MD;  Location: [facility];  Service: Orthopedics;  Laterality: Left;  . TOTAL KNEE REVISION Right 04/22/2018   Procedure: RIGHT TOTAL KNEE REVISION;  Surgeon: Frederik Pear, MD;  Location: [facility];  Service: Orthopedics;  Laterality: Right;  block   Social History   Occupational History  . Occupation: Tour manager: [employer]    Comment: [name]  Tobacco Use  . Smoking status: Never Smoker  . Smokeless tobacco: Never Used  Vaping Use  . Vaping Use: Never used  Substance and Sexual Activity  . Alcohol use: No  . Drug use: No  . Sexual activity: Yes

## 2020-04-23 ENCOUNTER — Other Ambulatory Visit: Payer: Self-pay | Admitting: Surgical

## 2020-04-23 ENCOUNTER — Telehealth: Payer: Self-pay

## 2020-04-23 MED ORDER — IBUPROFEN 800 MG PO TABS
800.0000 mg | ORAL_TABLET | Freq: Three times a day (TID) | ORAL | 0 refills | Status: DC | PRN
Start: 2020-04-23 — End: 2020-08-02

## 2020-04-23 NOTE — Telephone Encounter (Signed)
Received rxrf from [facility] for patient needing refill on ibuprofen 800mg  #30

## 2020-05-11 ENCOUNTER — Other Ambulatory Visit: Payer: Self-pay

## 2020-05-11 ENCOUNTER — Encounter: Payer: Self-pay | Admitting: Physical Medicine and Rehabilitation

## 2020-05-11 ENCOUNTER — Ambulatory Visit: Payer: Self-pay

## 2020-05-11 ENCOUNTER — Ambulatory Visit (INDEPENDENT_AMBULATORY_CARE_PROVIDER_SITE_OTHER): Payer: Medicare Other | Admitting: Physical Medicine and Rehabilitation

## 2020-05-11 VITALS — BP 119/82 | HR 68

## 2020-05-11 DIAGNOSIS — M25552 Pain in left hip: Secondary | ICD-10-CM | POA: Diagnosis not present

## 2020-05-11 DIAGNOSIS — M25561 Pain in right knee: Secondary | ICD-10-CM

## 2020-05-11 DIAGNOSIS — M5416 Radiculopathy, lumbar region: Secondary | ICD-10-CM | POA: Diagnosis not present

## 2020-05-11 DIAGNOSIS — M48061 Spinal stenosis, lumbar region without neurogenic claudication: Secondary | ICD-10-CM | POA: Diagnosis not present

## 2020-05-11 DIAGNOSIS — G8929 Other chronic pain: Secondary | ICD-10-CM | POA: Diagnosis not present

## 2020-05-11 DIAGNOSIS — M25562 Pain in left knee: Secondary | ICD-10-CM

## 2020-05-11 MED ORDER — METHYLPREDNISOLONE ACETATE 80 MG/ML IJ SUSP
80.0000 mg | Freq: Once | INTRAMUSCULAR | Status: AC
  Administered 2020-05-11: 80 mg

## 2020-05-11 NOTE — Progress Notes (Signed)
  Pt states pain in his left lower back. Pt states walking makes the pain worse. Pt states laying down help the pressuere off his back makes better. Pt allergies Latex.  Numeric Pain Rating Scale and Functional Assessment Average Pain 7   In the last MONTH (on 0-10 scale) has pain interfered with the following?  1. General activity like being  able to carry out your everyday physical activities such as walking, climbing stairs, carrying groceries, or moving a chair?  Rating(7)   +Driver, -BT, -Dye Allergies.

## 2020-05-13 ENCOUNTER — Encounter: Payer: Self-pay | Admitting: Physical Medicine and Rehabilitation

## 2020-05-13 NOTE — Progress Notes (Signed)
Jacob Quinn - 64 y.o. male MRN [id number]  Date of birth: 1956-02-06  Office Visit Note: Visit Date: 05/11/2020 PCP: Dorothyann Peng, NP Referred by: Dorothyann Peng, NP  Subjective: Chief Complaint  Patient presents with  . Lower Back - Pain   HPI: Jacob Quinn is a 64 y.o. male who comes in today For evaluation management of chronic worsening low back pain with referral into the left lower back and hip and thigh.  I last saw the patient in 2019 and completed a left L3 and L4 transforaminal epidural steroid injection with really good relief for many months.  He has been having ongoing orthopedic care and evaluation and follow-up by Dr. Anderson Malta.  He also complains of bilateral knee pain.  His pain is a 7 out of 10 on average and quite severe this is been worsening over the last several months.  Is been no new injuries or falls etc.  He reports walking makes his pain worse and he gets some relief laying down.  He has no paresthesias or tingling but he does have radiating a referral pain into the right posterior lateral hip and thigh.  It is more of a distribution of the L3 nerve somewhat L4 nerve.  He does have facet arthropathy of the lower spine.  MRI was reviewed with him again and reviewed below.  He currently does take some oxycodone daily.  His case is complicated by obesity as well as coronary artery disease and has had a prior myocardial infarction.  He is not on any anticoagulation.  Review of Systems  Musculoskeletal: Positive for back pain and joint pain.  All other systems reviewed and are negative.  Otherwise per HPI.  Assessment & Plan: Visit Diagnoses:  1. Lumbar radiculopathy   2. Spinal stenosis of lumbar region without neurogenic claudication   3. Pain in left hip   4. Chronic pain of both knees     Plan: Findings:  Chronic worsening low back pain particularly left more than right left hip and thigh but across the lower back worse with standing and  ambulating and this is consistent with MRI findings from a few years ago with facet arthropathy and lumbar stenosis particular lateral recess narrowing.  I think this is exacerbation of a very similar condition given the pattern of pain complaints.  I think at this point given the fact that over the time he is really not done well with conservative care and he is on oxycodone it would be wise to complete diagnostic and hopefully therapeutic epidural injection which would be a left L3 and L4 transforaminal injection based on symptoms and prior relief.  If he does not do well with that we would look at updated MRI of the lumbar spine and may be regrouping with a physical therapist or chiropractor.    Meds & Orders:  Meds ordered this encounter  Medications  . methylPREDNISolone acetate (DEPO-MEDROL) injection 80 mg    Orders Placed This Encounter  Procedures  . XR C-ARM NO REPORT  . Epidural Steroid injection    Follow-up: Return if symptoms worsen or fail to improve.   Procedures: No procedures performed  Lumbosacral Transforaminal Epidural Steroid Injection - Sub-Pedicular Approach with Fluoroscopic Guidance  Patient: Jacob Quinn      Date of Birth: [birth year] MRN: [id number] PCP: Dorothyann Peng, NP      Visit Date: 05/11/2020   Universal Protocol:    Date/Time: 05/11/2020  Consent Given By: the patient  Position: PRONE  Additional Comments: Vital signs were monitored before and after the procedure. Patient was prepped and draped in the usual sterile fashion. The correct patient, procedure, and site was verified.   Injection Procedure Details:  Procedure Site One Meds Administered:  Meds ordered this encounter  Medications  . methylPREDNISolone acetate (DEPO-MEDROL) injection 80 mg    Laterality: Left  Location/Site:  L3-L4 L4-L5  Needle size: 22 G  Needle type: Spinal  Needle Placement: Transforaminal  Findings:    -Comments: Excellent flow of contrast  along the nerve, nerve root and into the epidural space.  Procedure Details: After squaring off the end-plates to get a true AP view, the C-arm was positioned so that an oblique view of the foramen as noted above was visualized. The target area is just inferior to the "nose of the scotty dog" or sub pedicular. The soft tissues overlying this structure were infiltrated with 2-3 ml. of 1% Lidocaine without Epinephrine.  The spinal needle was inserted toward the target using a "trajectory" view along the fluoroscope beam.  Under AP and lateral visualization, the needle was advanced so it did not puncture dura and was located close the 6 O'Clock position of the pedical in AP tracterory. Biplanar projections were used to confirm position. Aspiration was confirmed to be negative for CSF and/or blood. A 1-2 ml. volume of Isovue-250 was injected and flow of contrast was noted at each level. Radiographs were obtained for documentation purposes.   After attaining the desired flow of contrast documented above, a 0.5 to 1.0 ml test dose of 0.25% Marcaine was injected into each respective transforaminal space.  The patient was observed for 90 seconds post injection.  After no sensory deficits were reported, and normal lower extremity motor function was noted,   the above injectate was administered so that equal amounts of the injectate were placed at each foramen (level) into the transforaminal epidural space.   Additional Comments:  The patient tolerated the procedure well Dressing: 2 x 2 sterile gauze and Band-Aid    Post-procedure details: Patient was observed during the procedure. Post-procedure instructions were reviewed.  Patient left the clinic in stable condition.     Clinical History: Lumbar Spine MRI 06/03/2015 [facility]  L3-4 bilateral facet arthropathy with moderate central canal stenosis and right more than left lateral recess narrowing.  L4-5 bilateral facet arthropathy and severe left  foraminal narrowing.  Mild central canal stenosis.  Disc herniation more left-sided.   He reports that he has never smoked. He has never used smokeless tobacco. No results for input(s): HGBA1C, LABURIC in the last 8760 hours.  Objective:  VS:  HT:    WT:   BMI:     BP:119/82  HR:68bpm  TEMP: ( )  RESP:  Physical Exam Vitals and nursing note reviewed.  Constitutional:      General: He is not in acute distress.    Appearance: Normal appearance. He is well-developed.  HENT:     Head: Normocephalic and atraumatic.  Eyes:     Conjunctiva/sclera: Conjunctivae normal.     Pupils: Pupils are equal, round, and reactive to light.  Cardiovascular:     Rate and Rhythm: Normal rate.     Pulses: Normal pulses.     Heart sounds: Normal heart sounds.  Pulmonary:     Effort: Pulmonary effort is normal. No respiratory distress.  Musculoskeletal:     Cervical back: Normal range of motion and neck supple. No rigidity.     Right  lower leg: No edema.     Left lower leg: No edema.     Comments: Patient very slow to rise from seated position has concordant low back pain with extension rotation to the left and facet loading.  Mild tenderness over the left greater trochanter not the right.  No pain with hip rotation at least in the groin.  Good distal strength no clonus.  Skin:    General: Skin is warm and dry.     Findings: No erythema or rash.  Neurological:     General: No focal deficit present.     Mental Status: He is alert and oriented to person, place, and time.     Sensory: No sensory deficit.     Coordination: Coordination normal.     Gait: Gait normal.  Psychiatric:        Mood and Affect: Mood normal.        Behavior: Behavior normal.     Ortho Exam  Imaging: No results found.  Past Medical/Family/Surgical/Social History: Medications & Allergies reviewed per EMR, new medications updated. Patient Active Problem List   Diagnosis Date Noted  . Syncope and collapse 05/01/2018  .  Primary osteoarthritis of right knee 04/22/2018  . Pain due to unicompartmental arthroplasty of knee ([place]) 04/19/2018  . Post op infection 11/22/2017  . Primary osteoarthritis of left knee 11/19/2017  . 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 Presence of left artificial knee joint 12/20/2016  . Presence of right artificial knee joint 12/20/2016  . Essential hypertension 08/08/2016  . Cardiomyopathy, ischemic 08/08/2016  . History of NSTEMI 08/07/2016  . Obesity (BMI 30-39.9) 08/07/2016  . Dyslipidemia, goal LDL below 70 08/07/2016  . CAD S/P percutaneous coronary angioplasty 08/07/2016  . Restless leg syndrome 09/16/2010  . Osteoarthritis 11/22/2007   Past Medical History:  Diagnosis Date  . 1st degree AV block   . BPH associated with nocturia   . CAD in native artery    a. NSTEMI 07/2016 -  LHC 08/07/16 showing 70% D1, 20% mLAD, segmental 95% then 75% OM stenosis, 85% acute marg, normal LVEDP - received PTCA/DES to OM.  Marland Kitchen Encephalitis, viral   . Hyperlipidemia 08/07/2016  . Hypertension   . Ischemic cardiomyopathy    a. LHC 07/2016 - mild mid anterolateral focal hypocontractility felt to be due to the circumflex stenosis, EF 50%.  . Myocardial infarction ([place]) 2017  . OA (osteoarthritis)   . Obesity (BMI 30-39.9) 08/07/2016  . Renal insufficiency    a. 2016 r/t dehydration/ACEI use.  Marland Kitchen Restless leg syndrome 09/16/2010  . Rheumatic fever   . Syncope 2009   associated with viral URI   Family History  Problem Relation Age of Onset  . Hypertension Mother   . Heart disease Mother   . Prostate cancer Father   . Dementia Father   . Depression Brother   . Hypertension Brother   . Hypertension Brother   . Stroke Brother   . Colon cancer Neg Hx   . Colon polyps Neg Hx   . Esophageal cancer Neg Hx   . Rectal cancer Neg Hx   . Stomach cancer Neg Hx    Past Surgical History:  Procedure Laterality Date  . CARDIAC CATHETERIZATION N/A 08/07/2016   Procedure: Left  Heart Cath and Coronary Angiography;  Surgeon: Troy Sine, MD;  Location: [facility];  Service: Cardiovascular;  Laterality: N/A;  . CARDIAC CATHETERIZATION N/A 08/07/2016   Procedure: Coronary Stent Intervention;  Surgeon: Troy Sine, MD;  Location: [facility];  Service: Cardiovascular;  Laterality: N/A;  . COLONOSCOPY  05/21/2012  . COLONOSCOPY  12/29/2019  . POLYPECTOMY    . REPLACEMENT TOTAL KNEE     left and right-  5 total   . TONSILLECTOMY    . TOTAL KNEE REVISION Left 11/19/2017   Procedure: LEFT TOTAL KNEE REVISION;  Surgeon: Frederik Pear, MD;  Location: [facility];  Service: Orthopedics;  Laterality: Left;  . TOTAL KNEE REVISION Right 04/22/2018   Procedure: RIGHT TOTAL KNEE REVISION;  Surgeon: Frederik Pear, MD;  Location: [facility];  Service: Orthopedics;  Laterality: Right;  block   Social History   Occupational History  . Occupation: English as a second language teacher: [employer]    Comment: [name]  Tobacco Use  . Smoking status: Never Smoker  . Smokeless tobacco: Never Used  Vaping Use  . Vaping Use: Never used  Substance and Sexual Activity  . Alcohol use: No  . Drug use: No  . Sexual activity: Yes

## 2020-05-13 NOTE — Procedures (Signed)
Lumbosacral Transforaminal Epidural Steroid Injection - Sub-Pedicular Approach with Fluoroscopic Guidance  Patient: Jacob Quinn      Date of Birth: [birth year] MRN: [id number] PCP: Dorothyann Peng, NP      Visit Date: 05/11/2020   Universal Protocol:    Date/Time: 05/11/2020  Consent Given By: the patient  Position: PRONE  Additional Comments: Vital signs were monitored before and after the procedure. Patient was prepped and draped in the usual sterile fashion. The correct patient, procedure, and site was verified.   Injection Procedure Details:  Procedure Site One Meds Administered:  Meds ordered this encounter  Medications   methylPREDNISolone acetate (DEPO-MEDROL) injection 80 mg    Laterality: Left  Location/Site:  L3-L4 L4-L5  Needle size: 46 G  Needle type: Spinal  Needle Placement: Transforaminal  Findings:    -Comments: Excellent flow of contrast along the nerve, nerve root and into the epidural space.  Procedure Details: After squaring off the end-plates to get a true AP view, the C-arm was positioned so that an oblique view of the foramen as noted above was visualized. The target area is just inferior to the "nose of the scotty dog" or sub pedicular. The soft tissues overlying this structure were infiltrated with 2-3 ml. of 1% Lidocaine without Epinephrine.  The spinal needle was inserted toward the target using a "trajectory" view along the fluoroscope beam.  Under AP and lateral visualization, the needle was advanced so it did not puncture dura and was located close the 6 O'Clock position of the pedical in AP tracterory. Biplanar projections were used to confirm position. Aspiration was confirmed to be negative for CSF and/or blood. A 1-2 ml. volume of Isovue-250 was injected and flow of contrast was noted at each level. Radiographs were obtained for documentation purposes.   After attaining the desired flow of contrast documented above, a 0.5 to 1.0  ml test dose of 0.25% Marcaine was injected into each respective transforaminal space.  The patient was observed for 90 seconds post injection.  After no sensory deficits were reported, and normal lower extremity motor function was noted,   the above injectate was administered so that equal amounts of the injectate were placed at each foramen (level) into the transforaminal epidural space.   Additional Comments:  The patient tolerated the procedure well Dressing: 2 x 2 sterile gauze and Band-Aid    Post-procedure details: Patient was observed during the procedure. Post-procedure instructions were reviewed.  Patient left the clinic in stable condition.

## 2020-06-25 ENCOUNTER — Telehealth: Payer: Self-pay

## 2020-06-25 DIAGNOSIS — Z96652 Presence of left artificial knee joint: Secondary | ICD-10-CM | POA: Diagnosis not present

## 2020-06-25 DIAGNOSIS — M25562 Pain in left knee: Secondary | ICD-10-CM | POA: Diagnosis not present

## 2020-06-25 DIAGNOSIS — M25462 Effusion, left knee: Secondary | ICD-10-CM | POA: Diagnosis not present

## 2020-06-25 NOTE — Telephone Encounter (Signed)
IC patient and he stepped in a pot hole last night and twisted knee that he had replaced-stated he could not hardly walk at all.  I offered patient an appt this afternoon with Lurena Joiner since Dr Marlou Sa out of office. Did tell him he would likely have to wait due to full schedule but he stated he had already called Dr Shaune Spittle office (who did the TKA) and they are seeing patient this afternoons

## 2020-06-25 NOTE — Telephone Encounter (Signed)
Patient called in saying he stepped on fire hose last night and feel like he has messed up something in his ;eg . Wanted to know if he can get worked in to come see dr Scientist, physiological or [place] today .

## 2020-07-01 DIAGNOSIS — M25562 Pain in left knee: Secondary | ICD-10-CM | POA: Diagnosis not present

## 2020-07-21 DIAGNOSIS — M25662 Stiffness of left knee, not elsewhere classified: Secondary | ICD-10-CM | POA: Diagnosis not present

## 2020-07-21 DIAGNOSIS — Z96651 Presence of right artificial knee joint: Secondary | ICD-10-CM | POA: Diagnosis not present

## 2020-07-21 DIAGNOSIS — Z96652 Presence of left artificial knee joint: Secondary | ICD-10-CM | POA: Diagnosis not present

## 2020-07-21 DIAGNOSIS — M6281 Muscle weakness (generalized): Secondary | ICD-10-CM | POA: Diagnosis not present

## 2020-07-22 DIAGNOSIS — M25562 Pain in left knee: Secondary | ICD-10-CM | POA: Diagnosis not present

## 2020-07-23 DIAGNOSIS — Z96652 Presence of left artificial knee joint: Secondary | ICD-10-CM | POA: Diagnosis not present

## 2020-07-23 DIAGNOSIS — Z96651 Presence of right artificial knee joint: Secondary | ICD-10-CM | POA: Diagnosis not present

## 2020-07-23 DIAGNOSIS — M25662 Stiffness of left knee, not elsewhere classified: Secondary | ICD-10-CM | POA: Diagnosis not present

## 2020-07-23 DIAGNOSIS — M6281 Muscle weakness (generalized): Secondary | ICD-10-CM | POA: Diagnosis not present

## 2020-07-29 DIAGNOSIS — Z96652 Presence of left artificial knee joint: Secondary | ICD-10-CM | POA: Diagnosis not present

## 2020-07-29 DIAGNOSIS — M25662 Stiffness of left knee, not elsewhere classified: Secondary | ICD-10-CM | POA: Diagnosis not present

## 2020-07-29 DIAGNOSIS — Z96651 Presence of right artificial knee joint: Secondary | ICD-10-CM | POA: Diagnosis not present

## 2020-07-29 DIAGNOSIS — M6281 Muscle weakness (generalized): Secondary | ICD-10-CM | POA: Diagnosis not present

## 2020-07-30 ENCOUNTER — Other Ambulatory Visit: Payer: Self-pay | Admitting: Adult Health

## 2020-07-30 DIAGNOSIS — I10 Essential (primary) hypertension: Secondary | ICD-10-CM

## 2020-08-02 ENCOUNTER — Telehealth: Payer: Self-pay | Admitting: Orthopedic Surgery

## 2020-08-02 ENCOUNTER — Other Ambulatory Visit: Payer: Self-pay | Admitting: Surgical

## 2020-08-02 MED ORDER — IBUPROFEN 800 MG PO TABS: 800.0000 mg | ORAL_TABLET | Freq: Three times a day (TID) | ORAL | 0 refills | Status: DC | PRN

## 2020-08-02 NOTE — Telephone Encounter (Signed)
Submitted RX 

## 2020-08-02 NOTE — Telephone Encounter (Signed)
Patient called requesting a refill of ibuprofen 800 mg. Please send to pharmacy on file. Patient phone number is [phone].

## 2020-08-02 NOTE — Telephone Encounter (Signed)
Please advise. Thanks.  

## 2020-08-04 DIAGNOSIS — M25662 Stiffness of left knee, not elsewhere classified: Secondary | ICD-10-CM | POA: Diagnosis not present

## 2020-08-04 DIAGNOSIS — Z96652 Presence of left artificial knee joint: Secondary | ICD-10-CM | POA: Diagnosis not present

## 2020-08-04 DIAGNOSIS — Z96651 Presence of right artificial knee joint: Secondary | ICD-10-CM | POA: Diagnosis not present

## 2020-08-04 DIAGNOSIS — M6281 Muscle weakness (generalized): Secondary | ICD-10-CM | POA: Diagnosis not present

## 2020-08-25 DIAGNOSIS — M6281 Muscle weakness (generalized): Secondary | ICD-10-CM | POA: Diagnosis not present

## 2020-08-25 DIAGNOSIS — Z96651 Presence of right artificial knee joint: Secondary | ICD-10-CM | POA: Diagnosis not present

## 2020-08-25 DIAGNOSIS — M25662 Stiffness of left knee, not elsewhere classified: Secondary | ICD-10-CM | POA: Diagnosis not present

## 2020-08-25 DIAGNOSIS — Z96652 Presence of left artificial knee joint: Secondary | ICD-10-CM | POA: Diagnosis not present

## 2020-09-07 ENCOUNTER — Ambulatory Visit: Payer: Medicare Other | Attending: Internal Medicine

## 2020-09-07 DIAGNOSIS — Z23 Encounter for immunization: Secondary | ICD-10-CM

## 2020-09-07 NOTE — Progress Notes (Signed)
   Covid-19 Vaccination Clinic  Name:  KEEGAN DUCEY    MRN: [id number] DOB: [birth year]  09/07/2020  Mr. Mckiver was observed post Covid-19 immunization for 15 minutes without incident. He was provided with Vaccine Information Sheet and instruction to access the V-Safe system.   Mr. Hemmerich was instructed to call 911 with any severe reactions post vaccine: Marland Kitchen Difficulty breathing  . Swelling of face and throat  . A fast heartbeat  . A bad rash all over body  . Dizziness and weakness

## 2020-10-12 ENCOUNTER — Other Ambulatory Visit: Payer: Self-pay | Admitting: Adult Health

## 2020-10-12 DIAGNOSIS — I2510 Atherosclerotic heart disease of native coronary artery without angina pectoris: Secondary | ICD-10-CM

## 2020-10-12 DIAGNOSIS — R351 Nocturia: Secondary | ICD-10-CM

## 2020-10-12 DIAGNOSIS — I10 Essential (primary) hypertension: Secondary | ICD-10-CM

## 2020-10-12 DIAGNOSIS — N401 Enlarged prostate with lower urinary tract symptoms: Secondary | ICD-10-CM

## 2020-11-25 ENCOUNTER — Ambulatory Visit: Payer: Medicare Other

## 2020-11-25 NOTE — Progress Notes (Signed)
Erroneous Encounter

## 2020-12-08 ENCOUNTER — Encounter: Payer: Self-pay | Admitting: Adult Health

## 2020-12-08 ENCOUNTER — Other Ambulatory Visit: Payer: Self-pay | Admitting: Adult Health

## 2020-12-08 ENCOUNTER — Ambulatory Visit (INDEPENDENT_AMBULATORY_CARE_PROVIDER_SITE_OTHER): Payer: Medicare Other | Admitting: Adult Health

## 2020-12-08 ENCOUNTER — Other Ambulatory Visit: Payer: Self-pay | Admitting: Cardiovascular Disease

## 2020-12-08 ENCOUNTER — Other Ambulatory Visit: Payer: Self-pay

## 2020-12-08 ENCOUNTER — Telehealth: Payer: Self-pay | Admitting: Adult Health

## 2020-12-08 VITALS — BP 160/100 | Wt 278.0 lb

## 2020-12-08 DIAGNOSIS — M7989 Other specified soft tissue disorders: Secondary | ICD-10-CM | POA: Diagnosis not present

## 2020-12-08 DIAGNOSIS — R351 Nocturia: Secondary | ICD-10-CM

## 2020-12-08 DIAGNOSIS — I10 Essential (primary) hypertension: Secondary | ICD-10-CM | POA: Diagnosis not present

## 2020-12-08 DIAGNOSIS — R600 Localized edema: Secondary | ICD-10-CM

## 2020-12-08 DIAGNOSIS — N401 Enlarged prostate with lower urinary tract symptoms: Secondary | ICD-10-CM

## 2020-12-08 MED ORDER — IBUPROFEN 600 MG PO TABS: 600.0000 mg | ORAL_TABLET | Freq: Three times a day (TID) | ORAL | 0 refills | Status: DC | PRN

## 2020-12-08 MED ORDER — FUROSEMIDE 20 MG PO TABS: 20.0000 mg | ORAL_TABLET | Freq: Every day | ORAL | 0 refills | Status: DC

## 2020-12-08 NOTE — Progress Notes (Signed)
Subjective:    Patient ID: Jacob Quinn, male    DOB: 16-Apr-1956, 65 y.o.   MRN: [id number]  HPI 65 year old male who  has a past medical history of 1st degree AV block, BPH associated with nocturia, CAD in native artery, Encephalitis, viral, Hyperlipidemia (08/07/2016), Hypertension, Ischemic cardiomyopathy, Myocardial infarction ([place]) (2017), OA (osteoarthritis), Obesity (BMI 30-39.9) (08/07/2016), Renal insufficiency, Restless leg syndrome (09/16/2010), Rheumatic fever, and Syncope (2009).  He presents to the office today for bilateral lower extremity edema. He has lasix prescribed by cardiology but cannot remember the last time he took it. Reports for an unknown amount of time bilateral lower extremity edema and most recently swelling of both hands. Denies CP or SOB. Has itching on hands and legs .     Review of Systems See HPI   Past Medical History:  Diagnosis Date  . 1st degree AV block   . BPH associated with nocturia   . CAD in native artery    a. NSTEMI 07/2016 -  LHC 08/07/16 showing 70% D1, 20% mLAD, segmental 95% then 75% OM stenosis, 85% acute marg, normal LVEDP - received PTCA/DES to OM.  Marland Kitchen Encephalitis, viral   . Hyperlipidemia 08/07/2016  . Hypertension   . Ischemic cardiomyopathy    a. LHC 07/2016 - mild mid anterolateral focal hypocontractility felt to be due to the circumflex stenosis, EF 50%.  . Myocardial infarction ([place]) 2017  . OA (osteoarthritis)   . Obesity (BMI 30-39.9) 08/07/2016  . Renal insufficiency    a. 2016 r/t dehydration/ACEI use.  Marland Kitchen Restless leg syndrome 09/16/2010  . Rheumatic fever   . Syncope 2009   associated with viral URI    Social History   Socioeconomic History  . Marital status: Single    Spouse name: Not on file  . Number of children: 3  . Years of education: Not on file  . Highest education level: Not on file  Occupational History  . Occupation: English as a second language teacher: [employer]    Comment: [name]   Tobacco Use  . Smoking status: Never Smoker  . Smokeless tobacco: Never Used  Vaping Use  . Vaping Use: Never used  Substance and Sexual Activity  . Alcohol use: No  . Drug use: No  . Sexual activity: Yes  Other Topics Concern  . Not on file  Social History Narrative   Lives with daughter   Is retired -    Social Determinants of Radio broadcast assistant Strain: Not on [employer] Insecurity: Not on file  Transportation Needs: Not on file  Physical Activity: Not on file  Stress: Not on file  Social Connections: Not on file  Intimate Partner Violence: Not on file    Past Surgical History:  Procedure Laterality Date  . CARDIAC CATHETERIZATION N/A 08/07/2016   Procedure: Left Heart Cath and Coronary Angiography;  Surgeon: Troy Sine, MD;  Location: [facility];  Service: Cardiovascular;  Laterality: N/A;  . CARDIAC CATHETERIZATION N/A 08/07/2016   Procedure: Coronary Stent Intervention;  Surgeon: Troy Sine, MD;  Location: [facility];  Service: Cardiovascular;  Laterality: N/A;  . COLONOSCOPY  05/21/2012  . COLONOSCOPY  12/29/2019  . POLYPECTOMY    . REPLACEMENT TOTAL KNEE     left and right-  5 total   . TONSILLECTOMY    . TOTAL KNEE REVISION Left 11/19/2017   Procedure: LEFT TOTAL KNEE REVISION;  Surgeon: Frederik Pear,  MD;  Location: [facility];  Service: Orthopedics;  Laterality: Left;  . TOTAL KNEE REVISION Right 04/22/2018   Procedure: RIGHT TOTAL KNEE REVISION;  Surgeon: Frederik Pear, MD;  Location: [facility];  Service: Orthopedics;  Laterality: Right;  block    Family History  Problem Relation Age of Onset  . Hypertension Mother   . Heart disease Mother   . Prostate cancer Father   . Dementia Father   . Depression Brother   . Hypertension Brother   . Hypertension Brother   . Stroke Brother   . Colon cancer Neg Hx   . Colon polyps Neg Hx   . Esophageal cancer Neg Hx   . Rectal cancer Neg Hx   . Stomach cancer Neg Hx     Allergies   Allergen Reactions  . Penicillins Anaphylaxis    *Has tolerated cephalosporins in the past*  Has patient had a PCN reaction causing immediate rash, facial/tongue/throat swelling, SOB or lightheadedness with hypotension: Yes Has patient had a PCN reaction causing severe rash involving mucus membranes or skin necrosis: No Has patient had a PCN reaction that required hospitalization: No Has patient had a PCN reaction occurring within the last 10 years: No If all of the above answers are "NO", then may proceed with Cephalosporin use.   . Tizanidine Other (See Comments)    High fevers, shaking  . Latex Rash    Current Outpatient Medications on File Prior to Visit  Medication Sig Dispense Refill  . amLODipine (NORVASC) 10 MG tablet TAKE 1 TABLET BY MOUTH EVERY DAY 90 tablet 0  . aspirin 81 MG tablet Take 81 mg by mouth daily.    Marland Kitchen atorvastatin (LIPITOR) 80 MG tablet TAKE 1 TABLET BY MOUTH EVERY DAY 90 tablet 2  . Evolocumab (REPATHA SURECLICK) 176 MG/ML SOAJ Inject 140 mg into the skin every 14 (fourteen) days. 6 pen 3  . ezetimibe (ZETIA) 10 MG tablet Take 1 tablet (10 mg total) by mouth daily. 90 tablet 3  . lisinopril (ZESTRIL) 20 MG tablet TAKE 1 TABLET BY MOUTH EVERY DAY 90 tablet 0  . metoprolol succinate (TOPROL-XL) 25 MG 24 hr tablet TAKE 1 TABLET BY MOUTH EVERY DAY 90 tablet 0  . Multiple Vitamin (MULTI VITAMIN DAILY) TABS Take 1 tablet by mouth daily.    . nitroGLYCERIN (NITROSTAT) 0.4 MG SL tablet Place 1 tablet (0.4 mg total) under the tongue every 5 (five) minutes as needed for chest pain. Up to 3 doses. 25 tablet 3  . tamsulosin (FLOMAX) 0.4 MG CAPS capsule TAKE 1 CAPSULE BY MOUTH EVERY DAY 90 capsule 0  . furosemide (LASIX) 20 MG tablet Take 1 tablet (20 mg total) by mouth daily. MAY TAKE AN ADDITIONAL DOSE FOR ADDITIONAL SWELLING 90 tablet 3   No current facility-administered medications on file prior to visit.    BP (!) 160/100   Wt 278 lb (126.1 kg)   BMI 36.68 kg/m        Objective:   Physical Exam Vitals and nursing note reviewed.  Constitutional:      Appearance: Normal appearance.  Cardiovascular:     Rate and Rhythm: Normal rate and regular rhythm.     Pulses: Normal pulses.     Heart sounds: Normal heart sounds.     Comments: Bilateral hands and digits on both hands are puffy but no pitting edema.  Pulmonary:     Effort: Pulmonary effort is normal.     Breath sounds: Normal breath sounds.  Musculoskeletal:  General: Normal range of motion.     Right lower leg: 3+ Pitting Edema present.     Left lower leg: 3+ Pitting Edema present.  Skin:    General: Skin is warm and dry.     Comments: Dry skin noted on legs   Neurological:     General: No focal deficit present.     Mental Status: He is alert and oriented to person, place, and time.       Assessment & Plan:  1. Lower extremity edema - advised to fill his prescription for lasix, he has refills at the pharmacy. Can take 40 mg today and then start taking 20 mg daily after that - Low sodium diet  - Follow up if not improved   2. Essential hypertension - Take lasix and BP meds as directed.  - Follow up with Cardiology or myself if BP not at goal   3. Swelling of both hands - Lasix will likely fix this   Dorothyann Peng, NP

## 2020-12-08 NOTE — Telephone Encounter (Signed)
I have sent in 20 tablets, everything else will need to come from cardiology so I advise him to contact them

## 2020-12-08 NOTE — Patient Instructions (Signed)
Please call [facility] and tell them to refill the lasix  Phone:  [phone]   You can take two tabs today when you get it and then start taking a single tab tomorrow

## 2020-12-08 NOTE — Telephone Encounter (Signed)
Patient states he called the pharmacy and was told there has not been a refill sent in for his Lasik.  Says [name] told him that he would call it in.   Please advise.

## 2020-12-09 NOTE — Telephone Encounter (Signed)
30 DAY SUPPLY SENT TO THE PHARMACY BY E-SCRIBE.  PT IS DUE FOR CPX.

## 2020-12-22 ENCOUNTER — Other Ambulatory Visit: Payer: Self-pay | Admitting: Adult Health

## 2020-12-22 DIAGNOSIS — N401 Enlarged prostate with lower urinary tract symptoms: Secondary | ICD-10-CM

## 2020-12-23 NOTE — Telephone Encounter (Signed)
DENIED.  PAST DUE FOR CPX.

## 2020-12-29 ENCOUNTER — Ambulatory Visit (INDEPENDENT_AMBULATORY_CARE_PROVIDER_SITE_OTHER): Payer: Medicare Other | Admitting: Physician Assistant

## 2020-12-29 ENCOUNTER — Other Ambulatory Visit: Payer: Self-pay

## 2020-12-29 ENCOUNTER — Other Ambulatory Visit: Payer: Self-pay | Admitting: Adult Health

## 2020-12-29 VITALS — BP 150/98 | HR 75 | Ht 73.0 in | Wt 270.0 lb

## 2020-12-29 DIAGNOSIS — R0609 Other forms of dyspnea: Secondary | ICD-10-CM

## 2020-12-29 DIAGNOSIS — I10 Essential (primary) hypertension: Secondary | ICD-10-CM

## 2020-12-29 DIAGNOSIS — E785 Hyperlipidemia, unspecified: Secondary | ICD-10-CM

## 2020-12-29 DIAGNOSIS — Z79899 Other long term (current) drug therapy: Secondary | ICD-10-CM

## 2020-12-29 DIAGNOSIS — I2510 Atherosclerotic heart disease of native coronary artery without angina pectoris: Secondary | ICD-10-CM

## 2020-12-29 DIAGNOSIS — R0600 Dyspnea, unspecified: Secondary | ICD-10-CM | POA: Diagnosis not present

## 2020-12-29 LAB — BASIC METABOLIC PANEL
BUN/Creatinine Ratio: 8 — ABNORMAL LOW (ref 10–24)
BUN: 16 mg/dL (ref 8–27)
CO2: 25 mmol/L (ref 20–29)
Calcium: 10.1 mg/dL (ref 8.6–10.2)
Chloride: 97 mmol/L (ref 96–106)
Creatinine, Ser: 1.89 mg/dL — ABNORMAL HIGH (ref 0.76–1.27)
Glucose: 122 mg/dL — ABNORMAL HIGH (ref 65–99)
Potassium: 4.1 mmol/L (ref 3.5–5.2)
Sodium: 138 mmol/L (ref 134–144)
eGFR: 39 mL/min/{1.73_m2} — ABNORMAL LOW (ref >59)

## 2020-12-29 LAB — CBC
Hematocrit: 41.4 % (ref 37.5–51.0)
Hemoglobin: 14 g/dL (ref 13.0–17.7)
MCH: 29.1 pg (ref 26.6–33.0)
MCHC: 33.8 g/dL (ref 31.5–35.7)
MCV: 86 fL (ref 79–97)
Platelets: 301 10*3/uL (ref 150–450)
RBC: 4.81 x10E6/uL (ref 4.14–5.80)
RDW: 13.6 % (ref 11.6–15.4)
WBC: 11.3 10*3/uL — ABNORMAL HIGH (ref 3.4–10.8)

## 2020-12-29 LAB — TSH: TSH: 2.33 u[IU]/mL (ref 0.450–4.500)

## 2020-12-29 MED ORDER — REPATHA SURECLICK 140 MG/ML ~~LOC~~ SOAJ: 140.0000 mg | SUBCUTANEOUS | 4 refills | Status: DC

## 2020-12-29 MED ORDER — IRBESARTAN 150 MG PO TABS: 150.0000 mg | ORAL_TABLET | Freq: Every day | ORAL | 3 refills | Status: DC

## 2020-12-29 NOTE — Progress Notes (Signed)
Cardiology Office Note:    Date:  12/31/2020   ID:  Jacob Quinn, DOB 13-Jun-1956, MRN [id number]  PCP:  Jacob Peng, NP   [facility]  Cardiologist:  Jacob Majestic, MD  Advanced Practice Provider:  No care team member to display Electrophysiologist:  None   Referring MD: Jacob Peng, NP   Chief Complaint  Patient presents with  . Follow-up    Seen for Dr. Claiborne Quinn    History of Present Illness:    Jacob Quinn is a 65 y.o. male with a hx of CAD, HTN, HLD, RLS, rheumatic fever, and h/o syncope.  Patient had a NSTEMI in October 2017 and had DES to left circumflex marginal vessel, EF at the time of cath was 50%.  He had a 70% D1 and 85% small anterior RV marginal branch of RCA residual.  Myoview in October 2018 was low risk, EF 46%.  He did have a syncopal episode in 2019 attributed to combination of pain medication, hypertensive medication and dehydration.  His lisinopril was later cut back.  Echocardiogram performed in July 2019 showed preserved EF.  I last saw the patient on 02/27/2020 for lower extremity edema.  I started him on hydrochlorothiazide 25 mg daily for the leg edema and the blood pressure.  Echocardiogram obtained on 03/22/2020 showed EF 60 to 65%, mild LVH, mild AI, mild dilatation of the aortic root up to 40 mmHg.  Unfortunately leg edema did not go away with hydrochlorothiazide, Dr. Claiborne Quinn placed him on Lasix in May 2021.  Patient presents today with multiple medication changes.  He is no longer taking the amlodipine or the Lipitor.  He need a refill on Repatha.  Instead of lisinopril, [facility].  However losartan has not controlled his blood pressure very well. I recommended switching to irbesartan for better blood pressure control.  He was switched from metoprolol succinate to atenolol, however he stopped atenolol due to muscle cramps.  I recommend he go back to metoprolol succinate 25 mg daily.  For the past month, he  has been having worsening dyspnea on exertion and fatigue with exertion.  I recommended secondary work-up including CBC, basic metabolic panel and a TSH.  Decrease in functional ability is concerning, if lab work are normal, I recommend he proceed with Lexiscan Myoview to make sure there is no cardiac reason for his symptoms.   Past Medical History:  Diagnosis Date  . 1st degree AV block   . BPH associated with nocturia   . CAD in native artery    a. NSTEMI 07/2016 -  LHC 08/07/16 showing 70% D1, 20% mLAD, segmental 95% then 75% OM stenosis, 85% acute marg, normal LVEDP - received PTCA/DES to OM.  Marland Kitchen Encephalitis, viral   . Hyperlipidemia 08/07/2016  . Hypertension   . Ischemic cardiomyopathy    a. LHC 07/2016 - mild mid anterolateral focal hypocontractility felt to be due to the circumflex stenosis, EF 50%.  . Myocardial infarction ([place]) 2017  . OA (osteoarthritis)   . Obesity (BMI 30-39.9) 08/07/2016  . Renal insufficiency    a. 2016 r/t dehydration/ACEI use.  Marland Kitchen Restless leg syndrome 09/16/2010  . Rheumatic fever   . Syncope 2009   associated with viral URI    Past Surgical History:  Procedure Laterality Date  . CARDIAC CATHETERIZATION N/A 08/07/2016   Procedure: Left Heart Cath and Coronary Angiography;  Surgeon: Jacob Sine, MD;  Location: [facility];  Service:  Cardiovascular;  Laterality: N/A;  . CARDIAC CATHETERIZATION N/A 08/07/2016   Procedure: Coronary Stent Intervention;  Surgeon: Jacob Sine, MD;  Location: [facility];  Service: Cardiovascular;  Laterality: N/A;  . COLONOSCOPY  05/21/2012  . COLONOSCOPY  12/29/2019  . POLYPECTOMY    . REPLACEMENT TOTAL KNEE     left and right-  5 total   . TONSILLECTOMY    . TOTAL KNEE REVISION Left 11/19/2017   Procedure: LEFT TOTAL KNEE REVISION;  Surgeon: Jacob Pear, MD;  Location: [facility];  Service: Orthopedics;  Laterality: Left;  . TOTAL KNEE REVISION Right 04/22/2018   Procedure: RIGHT TOTAL KNEE REVISION;   Surgeon: Jacob Pear, MD;  Location[facility];  Service: Orthopedics;  Laterality: Right;  block    Current Medications: Current Meds  Medication Sig  . aspirin 81 MG tablet Take 81 mg by mouth daily.  . furosemide (LASIX) 20 MG tablet Take 1 tablet (20 mg total) by mouth daily. MAY TAKE AN ADDITIONAL DOSE FOR ADDITIONAL SWELLING  . ibuprofen (ADVIL) 600 MG tablet Take 1 tablet (600 mg total) by mouth every 8 (eight) hours as needed.  . irbesartan (AVAPRO) 150 MG tablet Take 1 tablet (150 mg total) by mouth daily.  . metoprolol succinate (TOPROL-XL) 25 MG 24 hr tablet TAKE 1 TABLET BY MOUTH EVERY DAY  . Multiple Vitamin (MULTI VITAMIN DAILY) TABS Take 1 tablet by mouth daily.  . nitroGLYCERIN (NITROSTAT) 0.4 MG SL tablet Place 1 tablet (0.4 mg total) under the tongue every 5 (five) minutes as needed for chest pain. Up to 3 doses.  . tamsulosin (FLOMAX) 0.4 MG CAPS capsule Take 1 capsule by mouth daily.  **DUE FOR YEARLY PHYSICAL**  . [DISCONTINUED] amLODipine (NORVASC) 10 MG tablet TAKE 1 TABLET BY MOUTH EVERY DAY  . [DISCONTINUED] atorvastatin (LIPITOR) 80 MG tablet TAKE 1 TABLET BY MOUTH EVERY DAY  . [DISCONTINUED] Evolocumab (REPATHA SURECLICK) 009 MG/ML SOAJ Inject 140 mg into the skin every 14 (fourteen) days.  . [DISCONTINUED] losartan (COZAAR) 100 MG tablet Take 1 tablet by mouth every morning.     Allergies:   Penicillins, Tizanidine, and Latex   Social History   Socioeconomic History  . Marital status: Single    Spouse name: Not on file  . Number of children: 3  . Years of education: Not on file  . Highest education level: Not on file  Occupational History  . Occupation: English as a second language teacher: [employer]    Comment: Jacob Quinn  Tobacco Use  . Smoking status: Never Smoker  . Smokeless tobacco: Never Used  Vaping Use  . Vaping Use: Never used  Substance and Sexual Activity  . Alcohol use: No  . Drug use: No  . Sexual activity: Yes  Other Topics Concern   . Not on file  Social History Narrative   Lives with daughter   Is retired -    Investment banker, operational of Radio broadcast assistant Strain: Not on [employer] Insecurity: Not on file  Transportation Needs: Not on file  Physical Activity: Not on file  Stress: Not on file  Social Connections: Not on file     Family History: The patient's family history includes Dementia in his father; Depression in his brother; Heart disease in his mother; Hypertension in his brother, brother, and mother; Prostate cancer in his father; Stroke in his brother. There is no history of Colon cancer, Colon polyps, Esophageal cancer, Rectal cancer, or Stomach  cancer.  ROS:   Please see the history of present illness.     All other systems reviewed and are negative.  EKGs/Labs/Other Studies Reviewed:    The following studies were reviewed today:  Cath 08/07/2016  Ost 1st Diag to 1st Diag lesion, 70 %stenosed.  Mid LAD lesion, 20 %stenosed.  Acute Mrg lesion, 85 %stenosed.  1st Mrg-2 lesion, 70 %stenosed.  A STENT RESOLUTE INTEG 3.0X22 drug eluting stent was successfully placed, and does not overlap previously placed stent.  1st Mrg-1 lesion, 95 %stenosed.  Post intervention, there is a 0% residual stenosis.  There is no mitral valve regurgitation.  LV end diastolic pressure is normal.   Non-ST segment elevation myocardial infarction secondary to high grade segmental circumflex marginal stenoses.  Three-vessel CAD with 70% ostial stenosis of the first agonal branch of the LAD with 20% mid LAD stenosis, segmental 9575 and 35% circumflex marginal stenoses; an 85% ostial stenosis and a small anterior RV marginal branch of the RCA.  Successful PCI to the circumflex marginal vessel with PTCA and DES stenting with ultimate insertion of a 3.022 mm Resolute DES stent postdilated with stent taper from 3.25 mm proximally to 3.05 mm distally with the 95 and 75% stenoses being reduced to  0%.  RECOMMENDATION: The patient will be maintained on dual antiplatelet therapy for minimum of a year.  Medical therapy for hypertension and concomitant CAD including beta blocker, ACE-I/ARB therapy , possible nitrates or amlodipine.  High potency statin therapy will be necessary.   EKG:  EKG is ordered today.  The ekg ordered today demonstrates   Sinus rhythm, LVH, first-degree AV block.  Recent Labs: 12/29/2020: BUN 16; Creatinine, Ser 1.89; Hemoglobin 14.0; Platelets 301; Potassium 4.1; Sodium 138; TSH 2.330  Recent Lipid Panel    Component Value Date/Time   CHOL 134 11/11/2019 0849   CHOL 131 10/10/2018 0834   TRIG 110.0 11/11/2019 0849   HDL 37.70 (L) 11/11/2019 0849   HDL 42 10/10/2018 0834   CHOLHDL 4 11/11/2019 0849   VLDL 22.0 11/11/2019 0849   LDLCALC 74 11/11/2019 0849   LDLCALC 71 10/10/2018 0834   LDLDIRECT 110.4 02/19/2007 0819     Risk Assessment/Calculations:       Physical Exam:    VS:  BP (!) 150/98   Pulse 75   Ht 6\' 1"  (1.854 m)   Wt 270 lb (122.5 kg)   BMI 35.62 kg/m     Wt Readings from Last 3 Encounters:  12/29/20 270 lb (122.5 kg)  12/08/20 278 lb (126.1 kg)  03/26/20 268 lb (121.6 kg)     GEN:  Well nourished, well developed in no acute distress HEENT: Normal NECK: No JVD; No carotid bruits LYMPHATICS: No lymphadenopathy CARDIAC: RRR, no murmurs, rubs, gallops RESPIRATORY:  Clear to auscultation without rales, wheezing or rhonchi  ABDOMEN: Soft, non-tender, non-distended MUSCULOSKELETAL:  No edema; No deformity  SKIN: Warm and dry NEUROLOGIC:  Alert and oriented x 3 PSYCHIATRIC:  Normal affect   ASSESSMENT:    1. DOE (dyspnea on exertion)   2. Essential hypertension   3. Medication management   4. Coronary artery disease involving native coronary artery of native heart without angina pectoris   5. Hyperlipidemia LDL goal <70    PLAN:    In order of problems listed above:  1. Dyspnea on exertion: Also accompanied by  worsening fatigue recently.  Obtain CBC, TSH and basic metabolic panel.  If above labs are normal, will proceed with Myoview  2. Hypertension: Blood pressure uncontrolled.  We will switch losartan to irbesartan.  He is having issues taking atenolol, will restart Toprol-XL.  3. CAD: Denies any recent chest pain, however worsening dyspnea on exertion and increasing fatigue is concerning  4. Hyperlipidemia: Continue Repatha.   Shared Decision Making/Informed Consent The risks [chest pain, shortness of breath, cardiac arrhythmias, dizziness, blood pressure fluctuations, myocardial infarction, stroke/transient ischemic attack, nausea, vomiting, allergic reaction, radiation exposure, metallic taste sensation and life-threatening complications (estimated to be 1 in 10,000)], benefits (risk stratification, diagnosing coronary artery disease, treatment guidance) and alternatives of a nuclear stress test were discussed in detail with Mr. Snee and he agrees to proceed.       Medication Adjustments/Labs and Tests Ordered: Current medicines are reviewed at length with the patient today.  Concerns regarding medicines are outlined above.  Orders Placed This Encounter  Procedures  . CBC  . TSH  . Basic Metabolic Panel (BMET)  . MYOCARDIAL PERFUSION IMAGING  . EKG 12-Lead   Meds ordered this encounter  Medications  . irbesartan (AVAPRO) 150 MG tablet    Sig: Take 1 tablet (150 mg total) by mouth daily.    Dispense:  90 tablet    Refill:  3  . Evolocumab (REPATHA SURECLICK) 841 MG/ML SOAJ    Sig: Inject 140 mg into the skin every 14 (fourteen) days.    Dispense:  1 mL    Refill:  4    Patient Instructions  Medication Instructions:  STOP- Amlodipine, Atorvastatin and Losartan START- Irbesartan(Avapro) 150 mg by mouth daily  *If you need a refill on your cardiac medications before your next appointment, please call your pharmacy*   Lab Work: CBC, BMP and TSH  If you have labs (blood  work) drawn today and your tests are completely normal, you will receive your results only by: Marland Kitchen MyChart Message (if you have MyChart) OR . A paper copy in the mail If you have any lab test that is abnormal or we need to change your treatment, we will call you to review the results.   Testing/Procedures: Your physician has requested that you have a lexiscan myoview. For further information please visit [email or web address]. Please follow instruction sheet, as given.   Follow-Up: At [facility], you and your health needs are our priority.  As part of our continuing mission to provide you with exceptional heart care, we have created designated Provider Care Teams.  These Care Teams include your primary Cardiologist (physician) and Advanced Practice Providers (APPs -  Physician Assistants and Nurse Practitioners) who all work together to provide you with the care you need, when you need it.  We recommend signing up for the patient portal called "MyChart".  Sign up information is provided on this After Visit Summary.  MyChart is used to connect with patients for Virtual Visits (Telemedicine).  Patients are able to view lab/test results, encounter notes, upcoming appointments, etc.  Non-urgent messages can be sent to your provider as well.   To learn more about what you can do with MyChart, go to [email or web address].    Your next appointment:   2 month(s)  The format for your next appointment:   In Person  Provider:   You may see Jacob Majestic, MD or one of the following Advanced Practice Providers on your designated Care Team:    Almyra Deforest, PA-C  Fabian Sharp, PA-C or   Roby Lofts, PA-C        SignedMarland Kitchen [place], [place]  12/31/2020 11:25  PM    [facility]

## 2020-12-29 NOTE — Patient Instructions (Signed)
Medication Instructions:  STOP- Amlodipine, Atorvastatin and Losartan START- Irbesartan(Avapro) 150 mg by mouth daily  *If you need a refill on your cardiac medications before your next appointment, please call your pharmacy*   Lab Work: CBC, BMP and TSH  If you have labs (blood work) drawn today and your tests are completely normal, you will receive your results only by: Marland Kitchen MyChart Message (if you have MyChart) OR . A paper copy in the mail If you have any lab test that is abnormal or we need to change your treatment, we will call you to review the results.   Testing/Procedures: Your physician has requested that you have a lexiscan myoview. For further information please visit [email or web address]. Please follow instruction sheet, as given.   Follow-Up: At [facility], you and your health needs are our priority.  As part of our continuing mission to provide you with exceptional heart care, we have created designated Provider Care Teams.  These Care Teams include your primary Cardiologist (physician) and Advanced Practice Providers (APPs -  Physician Assistants and Nurse Practitioners) who all work together to provide you with the care you need, when you need it.  We recommend signing up for the patient portal called "MyChart".  Sign up information is provided on this After Visit Summary.  MyChart is used to connect with patients for Virtual Visits (Telemedicine).  Patients are able to view lab/test results, encounter notes, upcoming appointments, etc.  Non-urgent messages can be sent to your provider as well.   To learn more about what you can do with MyChart, go to [email or web address].    Your next appointment:   2 month(s)  The format for your next appointment:   In Person  Provider:   You may see Shelva Majestic, MD or one of the following Advanced Practice Providers on your designated Care Team:    Almyra Deforest, PA-C  Fabian Sharp, PA-C or   Roby Lofts, [place]

## 2020-12-31 ENCOUNTER — Encounter: Payer: Self-pay | Admitting: Physician Assistant

## 2021-01-04 ENCOUNTER — Telehealth: Payer: Self-pay | Admitting: Adult Health

## 2021-01-04 ENCOUNTER — Telehealth: Payer: Self-pay

## 2021-01-04 NOTE — Telephone Encounter (Addendum)
Called patient and could not leave voice message for voicemail is not set up at the time of call. Will try calling patient again  ----- Message from [place], [place] sent at 01/03/2021  4:41 PM EST ----- Red blood cell count normal, thyroid function ok, kidney function down when compare to 1 year ago, recommend stop lasix and change it to as needed only, repeat BMET in 1 week.

## 2021-01-04 NOTE — Telephone Encounter (Signed)
Tried calling patient to  schedule Medicare Annual Wellness Visit (AWV) either virtually or in office. No answer    awvi  please schedule at anytime with [facility] Nurse Health Advisor 1 or 2   This should be a 45 minute visit.

## 2021-01-05 ENCOUNTER — Other Ambulatory Visit: Payer: Self-pay | Admitting: Adult Health

## 2021-01-05 DIAGNOSIS — I10 Essential (primary) hypertension: Secondary | ICD-10-CM

## 2021-01-05 DIAGNOSIS — I2510 Atherosclerotic heart disease of native coronary artery without angina pectoris: Secondary | ICD-10-CM

## 2021-01-06 ENCOUNTER — Telehealth: Payer: Self-pay | Admitting: Orthopedic Surgery

## 2021-01-06 NOTE — Telephone Encounter (Signed)
Pt called and asked if he could get some 800 mg Ibuprofen.

## 2021-01-06 NOTE — Telephone Encounter (Signed)
Not a good idea given patient's kidney function.  Creatinine elevated and GFR too low

## 2021-01-06 NOTE — Telephone Encounter (Signed)
See below

## 2021-01-07 NOTE — Telephone Encounter (Signed)
Tried calling. No answer. Went straight to greeting that VM not set up yet.

## 2021-01-07 NOTE — Telephone Encounter (Signed)
Tylenol is okay and I think topical voltaren would be worth a try. CBD oil may help too

## 2021-01-07 NOTE — Telephone Encounter (Signed)
IC advised. Wanted to know if there is something he could take instead?

## 2021-01-14 ENCOUNTER — Other Ambulatory Visit: Payer: Self-pay | Admitting: Adult Health

## 2021-01-14 DIAGNOSIS — I10 Essential (primary) hypertension: Secondary | ICD-10-CM

## 2021-01-18 ENCOUNTER — Other Ambulatory Visit: Payer: Self-pay

## 2021-01-18 ENCOUNTER — Ambulatory Visit (HOSPITAL_COMMUNITY)
Admission: RE | Admit: 2021-01-18 | Discharge: 2021-01-18 | Disposition: A | Discharge status: Home / Self Care | Payer: Medicare Other | Source: Ambulatory Visit | Attending: Cardiovascular Disease | Admitting: Cardiovascular Disease

## 2021-01-18 DIAGNOSIS — R0600 Dyspnea, unspecified: Secondary | ICD-10-CM

## 2021-01-18 DIAGNOSIS — R0609 Other forms of dyspnea: Secondary | ICD-10-CM

## 2021-01-18 LAB — MYOCARDIAL PERFUSION IMAGING
LV dias vol: 182 mL (ref 62–150)
LV sys vol: 96 mL
Peak HR: 90 {beats}/min
Rest HR: 60 {beats}/min
SDS: 2
SRS: 0
SSS: 2
TID: 1.08

## 2021-01-18 MED ORDER — TECHNETIUM TC 99M TETROFOSMIN IV KIT
30.8000 | PACK | Freq: Once | INTRAVENOUS | Status: AC | PRN
  Administered 2021-01-18: 30.8 via INTRAVENOUS
  Filled 2021-01-18: qty 31

## 2021-01-18 MED ORDER — REGADENOSON 0.4 MG/5ML IV SOLN
0.4000 mg | Freq: Once | INTRAVENOUS | Status: AC
  Administered 2021-01-18: 0.4 mg via INTRAVENOUS

## 2021-01-18 MED ORDER — TECHNETIUM TC 99M TETROFOSMIN IV KIT
11.0000 | PACK | Freq: Once | INTRAVENOUS | Status: AC | PRN
  Administered 2021-01-18: 11 via INTRAVENOUS
  Filled 2021-01-18: qty 11

## 2021-01-19 ENCOUNTER — Ambulatory Visit (INDEPENDENT_AMBULATORY_CARE_PROVIDER_SITE_OTHER): Payer: Medicare Other | Admitting: Orthopedic Surgery

## 2021-01-19 ENCOUNTER — Ambulatory Visit: Payer: Self-pay

## 2021-01-19 ENCOUNTER — Encounter: Payer: Self-pay | Admitting: Orthopedic Surgery

## 2021-01-19 DIAGNOSIS — M25561 Pain in right knee: Secondary | ICD-10-CM

## 2021-01-19 DIAGNOSIS — M25562 Pain in left knee: Secondary | ICD-10-CM

## 2021-01-19 MED ORDER — HYDROCODONE-ACETAMINOPHEN 5-325 MG PO TABS: ORAL_TABLET | ORAL | 0 refills | Status: DC

## 2021-01-19 MED ORDER — TRAMADOL HCL 50 MG PO TABS: 50.0000 mg | ORAL_TABLET | Freq: Two times a day (BID) | ORAL | 0 refills | Status: DC | PRN

## 2021-01-19 NOTE — Progress Notes (Signed)
Office Visit Note   Patient: Jacob Quinn           Date of Birth: Jun 08, 1956           MRN: [id number] Visit Date: 01/19/2021 Requested by: Dorothyann Peng, NP [address] [place],  [place] [id number] PCP: Dorothyann Peng, NP  Subjective: Chief Complaint  Patient presents with  . Left Knee - Pain  . Right Knee - Pain    HPI: Filimon is a 65 year old patient with bilateral knee pain left worse than right.  Has had revision knee replacement on both sides.  Ambulating with a crutch.  Has had recent worsening pain over the last 2 months but denies any fevers or chills.  The left knee has been significantly more painful.  Does wake him from sleep at night.  Requested ibuprofen refill but 3 weeks ago his creatinine had increased to 1.89.  Tried Tylenol arthritis without relief.              ROS: All systems reviewed are negative as they relate to the chief complaint within the history of present illness.  Patient denies  fevers or chills.   Assessment & Plan: Visit Diagnoses:  1. Pain in both knees, unspecified chronicity     Plan: Impression is bilateral knee pain.  Most of his pain in that left leg is in the proximal tibial region where he may have some micromotion of that cement within the proximal tibia.  Right knee looks good and is much less symptomatic than the left knee.  He also has a little lateral subluxation of the patella on the merchant view but his extensor mechanism is intact clinically.  Cannot really wear a brace because of allergy to the elastic.  Cannot take anti-inflammatories because of his serum creatinine.  Plan at this time is Ultram 1 every 12 hours as needed pain with Norco at night as needed.  Follow-up in 2 to 3 months as needed but in general I do not think there is any more surgery to be done on that left knee unless he gets infected.  Follow-Up Instructions: Return if symptoms worsen or fail to improve.   Orders:  Orders Placed This Encounter   Procedures  . XR Knee 1-2 Views Left  . XR KNEE 3 VIEW RIGHT   Meds ordered this encounter  Medications  . traMADol (ULTRAM) 50 MG tablet    Sig: Take 1 tablet (50 mg total) by mouth every 12 (twelve) hours as needed.    Dispense:  30 tablet    Refill:  0  . HYDROcodone-acetaminophen (NORCO/VICODIN) 5-325 MG tablet    Sig: 1 po q hs prn pain    Dispense:  30 tablet    Refill:  0      Procedures: No procedures performed   Clinical Data: No additional findings.  Objective: Vital Signs: There were no vitals taken for this visit.  Physical Exam:   Constitutional: Patient appears well-developed HEENT:  Head: Normocephalic Eyes:EOM are normal Neck: Normal range of motion Cardiovascular: Normal rate Pulmonary/chest: Effort normal Neurologic: Patient is alert Skin: Skin is warm Psychiatric: Patient has normal mood and affect    Ortho Exam: Ortho exam demonstrates intact extensor mechanism bilaterally with a 15 degree lag on the left-hand side.  Collaterals are generally stable on the right.  On the left he has slight increased laxity to varus testing but overall stable considering the type of revision he had.  Flexes to about 70.  Gait is antalgic to the left.  No warmth or effusion to either knee.  Specialty Comments:  No specialty comments available.  Imaging: MYOCARDIAL PERFUSION IMAGING  Result Date: 01/18/2021  The left ventricular ejection fraction is mildly decreased (45-54%).  Nuclear stress EF: 47%.  There was no ST segment deviation noted during stress.  The study is normal with no evidence of ischemia.    XR Knee 1-2 Views Left  Result Date: 01/19/2021 AP lateral merchant left knee radiographs reviewed.  Patella is mildly laterally subluxated on the merchant view.  Revision knee prosthesis in good position alignment with no obvious complicating features.  There is slight lucency around the proximal segment mass around the proximal tibial implant.  No  acute fractures.  Femoral implant in reasonable position and alignment.  No lucent lines in this region other than hypertrophic callus/osteophyte formation around the proximal lateral aspect of the metaphysis.    PMFS History: Patient Active Problem List   Diagnosis Date Noted  . Syncope and collapse 05/01/2018  . Primary osteoarthritis of right knee 04/22/2018  . Pain due to unicompartmental arthroplasty of knee ([place]) 04/19/2018  . Post op infection 11/22/2017  . Primary osteoarthritis of left knee 11/19/2017  . 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 Presence of left artificial knee joint 12/20/2016  . Presence of right artificial knee joint 12/20/2016  . Essential hypertension 08/08/2016  . Cardiomyopathy, ischemic 08/08/2016  . History of NSTEMI 08/07/2016  . Obesity (BMI 30-39.9) 08/07/2016  . Dyslipidemia, goal LDL below 70 08/07/2016  . CAD S/P percutaneous coronary angioplasty 08/07/2016  . Restless leg syndrome 09/16/2010  . Osteoarthritis 11/22/2007   Past Medical History:  Diagnosis Date  . 1st degree AV block   . BPH associated with nocturia   . CAD in native artery    a. NSTEMI 07/2016 -  LHC 08/07/16 showing 70% D1, 20% mLAD, segmental 95% then 75% OM stenosis, 85% acute marg, normal LVEDP - received PTCA/DES to OM.  Marland Kitchen Encephalitis, viral   . Hyperlipidemia 08/07/2016  . Hypertension   . Ischemic cardiomyopathy    a. LHC 07/2016 - mild mid anterolateral focal hypocontractility felt to be due to the circumflex stenosis, EF 50%.  . Myocardial infarction ([place]) 2017  . OA (osteoarthritis)   . Obesity (BMI 30-39.9) 08/07/2016  . Renal insufficiency    a. 2016 r/t dehydration/ACEI use.  Marland Kitchen Restless leg syndrome 09/16/2010  . Rheumatic fever   . Syncope 2009   associated with viral URI    Family History  Problem Relation Age of Onset  . Hypertension Mother   . Heart disease Mother   . Prostate cancer Father   . Dementia Father   . Depression  Brother   . Hypertension Brother   . Hypertension Brother   . Stroke Brother   . Colon cancer Neg Hx   . Colon polyps Neg Hx   . Esophageal cancer Neg Hx   . Rectal cancer Neg Hx   . Stomach cancer Neg Hx     Past Surgical History:  Procedure Laterality Date  . CARDIAC CATHETERIZATION N/A 08/07/2016   Procedure: Left Heart Cath and Coronary Angiography;  Surgeon: Troy Sine, MD;  Location: [facility];  Service: Cardiovascular;  Laterality: N/A;  . CARDIAC CATHETERIZATION N/A 08/07/2016   Procedure: Coronary Stent Intervention;  Surgeon: Troy Sine, MD;  Location: [facility];  Service: Cardiovascular;  Laterality: N/A;  . COLONOSCOPY  05/21/2012  .  COLONOSCOPY  12/29/2019  . POLYPECTOMY    . REPLACEMENT TOTAL KNEE     left and right-  5 total   . TONSILLECTOMY    . TOTAL KNEE REVISION Left 11/19/2017   Procedure: LEFT TOTAL KNEE REVISION;  Surgeon: Frederik Pear, MD;  Location: [facility];  Service: Orthopedics;  Laterality: Left;  . TOTAL KNEE REVISION Right 04/22/2018   Procedure: RIGHT TOTAL KNEE REVISION;  Surgeon: Frederik Pear, MD;  Location: [facility];  Service: Orthopedics;  Laterality: Right;  block   Social History   Occupational History  . Occupation: English as a second language teacher: [employer]    Comment: [name]  Tobacco Use  . Smoking status: Never Smoker  . Smokeless tobacco: Never Used  Vaping Use  . Vaping Use: Never used  Substance and Sexual Activity  . Alcohol use: No  . Drug use: No  . Sexual activity: Yes

## 2021-01-21 ENCOUNTER — Telehealth: Payer: Self-pay

## 2021-01-21 NOTE — Telephone Encounter (Addendum)
Left voice message for the patient to give office a call of stress test results and that the results were sent to his MyChart as well.  ----- Message from [place], [place] sent at 01/20/2021 11:39 AM EDT ----- Pumping function borderline low, no sign of significant reversible blockage. Recommend continue medical therapy, if dyspnea and fatigue is still present on the next follow up, may consider repeat echo.

## 2021-01-26 ENCOUNTER — Other Ambulatory Visit: Payer: Self-pay

## 2021-02-16 ENCOUNTER — Ambulatory Visit: Payer: Medicare Other | Admitting: Adult Health

## 2021-02-17 ENCOUNTER — Ambulatory Visit (INDEPENDENT_AMBULATORY_CARE_PROVIDER_SITE_OTHER): Payer: Medicare Other | Admitting: Adult Health

## 2021-02-17 ENCOUNTER — Encounter: Payer: Self-pay | Admitting: Adult Health

## 2021-02-17 ENCOUNTER — Other Ambulatory Visit: Payer: Self-pay

## 2021-02-17 ENCOUNTER — Ambulatory Visit (INDEPENDENT_AMBULATORY_CARE_PROVIDER_SITE_OTHER): Payer: Medicare Other

## 2021-02-17 ENCOUNTER — Other Ambulatory Visit: Payer: Self-pay | Admitting: Adult Health

## 2021-02-17 VITALS — BP 136/92 | HR 88 | Temp 97.9°F | Ht 73.0 in | Wt 256.2 lb

## 2021-02-17 DIAGNOSIS — G8929 Other chronic pain: Secondary | ICD-10-CM

## 2021-02-17 DIAGNOSIS — M25511 Pain in right shoulder: Secondary | ICD-10-CM

## 2021-02-17 DIAGNOSIS — M25512 Pain in left shoulder: Secondary | ICD-10-CM | POA: Diagnosis not present

## 2021-02-17 DIAGNOSIS — N401 Enlarged prostate with lower urinary tract symptoms: Secondary | ICD-10-CM

## 2021-02-17 MED ORDER — TAMSULOSIN HCL 0.4 MG PO CAPS: ORAL_CAPSULE | ORAL | 0 refills | Status: DC

## 2021-02-17 NOTE — Progress Notes (Signed)
Subjective:    Patient ID: Jacob Quinn, male    DOB: [birth year], 65 y.o.   MRN: [id number]  HPI  65 year old male who  has a past medical history of 1st degree AV block, BPH associated with nocturia, CAD in native artery, Encephalitis, viral, Hyperlipidemia (08/07/2016), Hypertension, Ischemic cardiomyopathy, Myocardial infarction ([place]) (2017), OA (osteoarthritis), Obesity (BMI 30-39.9) (08/07/2016), Renal insufficiency, Restless leg syndrome (09/16/2010), Rheumatic fever, and Syncope (2009).  He presents to the office today for chronic bilateral shoulder pain.  He reports that he has had bilateral shoulder pain for some time now but over the last month pain has become worse.  Pain is described as a "dull pain".  When laying down and with certain movements.  Also needs a refill on Flomax  Review of Systems See HPI   Past Medical History:  Diagnosis Date  . 1st degree AV block   . BPH associated with nocturia   . CAD in native artery    a. NSTEMI 07/2016 -  LHC 08/07/16 showing 70% D1, 20% mLAD, segmental 95% then 75% OM stenosis, 85% acute marg, normal LVEDP - received PTCA/DES to OM.  Marland Kitchen Encephalitis, viral   . Hyperlipidemia 08/07/2016  . Hypertension   . Ischemic cardiomyopathy    a. LHC 07/2016 - mild mid anterolateral focal hypocontractility felt to be due to the circumflex stenosis, EF 50%.  . Myocardial infarction ([place]) 2017  . OA (osteoarthritis)   . Obesity (BMI 30-39.9) 08/07/2016  . Renal insufficiency    a. 2016 r/t dehydration/ACEI use.  Marland Kitchen Restless leg syndrome 09/16/2010  . Rheumatic fever   . Syncope 2009   associated with viral URI    Social History   Socioeconomic History  . Marital status: Single    Spouse name: Not on file  . Number of children: 3  . Years of education: Not on file  . Highest education level: Not on file  Occupational History  . Occupation: English as a second language teacher: [employer]    Comment: [name]  Tobacco Use  .  Smoking status: Never Smoker  . Smokeless tobacco: Never Used  Vaping Use  . Vaping Use: Never used  Substance and Sexual Activity  . Alcohol use: No  . Drug use: No  . Sexual activity: Yes  Other Topics Concern  . Not on file  Social History Narrative   Lives with daughter   Is retired -    Social Determinants of Radio broadcast assistant Strain: Not on [employer] Insecurity: Not on file  Transportation Needs: Not on file  Physical Activity: Not on file  Stress: Not on file  Social Connections: Not on file  Intimate Partner Violence: Not on file    Past Surgical History:  Procedure Laterality Date  . CARDIAC CATHETERIZATION N/A 08/07/2016   Procedure: Left Heart Cath and Coronary Angiography;  Surgeon: Troy Sine, MD;  Location: [facility];  Service: Cardiovascular;  Laterality: N/A;  . CARDIAC CATHETERIZATION N/A 08/07/2016   Procedure: Coronary Stent Intervention;  Surgeon: Troy Sine, MD;  Location: [name];  Service: Cardiovascular;  Laterality: N/A;  . COLONOSCOPY  05/21/2012  . COLONOSCOPY  12/29/2019  . POLYPECTOMY    . REPLACEMENT TOTAL KNEE     left and right-  5 total   . TONSILLECTOMY    . TOTAL KNEE REVISION Left 11/19/2017   Procedure: LEFT TOTAL KNEE REVISION;  Surgeon: Frederik Pear,  MD;  Location: [facility];  Service: Orthopedics;  Laterality: Left;  . TOTAL KNEE REVISION Right 04/22/2018   Procedure: RIGHT TOTAL KNEE REVISION;  Surgeon: Frederik Pear, MD;  Location: [facility];  Service: Orthopedics;  Laterality: Right;  block    Family History  Problem Relation Age of Onset  . Hypertension Mother   . Heart disease Mother   . Prostate cancer Father   . Dementia Father   . Depression Brother   . Hypertension Brother   . Hypertension Brother   . Stroke Brother   . Colon cancer Neg Hx   . Colon polyps Neg Hx   . Esophageal cancer Neg Hx   . Rectal cancer Neg Hx   . Stomach cancer Neg Hx     Allergies  Allergen Reactions  .  Penicillins Anaphylaxis    *Has tolerated cephalosporins in the past*  Has patient had a PCN reaction causing immediate rash, facial/tongue/throat swelling, SOB or lightheadedness with hypotension: Yes Has patient had a PCN reaction causing severe rash involving mucus membranes or skin necrosis: No Has patient had a PCN reaction that required hospitalization: No Has patient had a PCN reaction occurring within the last 10 years: No If all of the above answers are "NO", then may proceed with Cephalosporin use.   . Tizanidine Other (See Comments)    High fevers, shaking  . Latex Rash    Current Outpatient Medications on File Prior to Visit  Medication Sig Dispense Refill  . aspirin 81 MG tablet Take 81 mg by mouth daily.    . Evolocumab (REPATHA SURECLICK) 268 MG/ML SOAJ Inject 140 mg into the skin every 14 (fourteen) days. 1 mL 4  . furosemide (LASIX) 20 MG tablet Take 1 tablet (20 mg total) by mouth daily. MAY TAKE AN ADDITIONAL DOSE FOR ADDITIONAL SWELLING 20 tablet 0  . HYDROcodone-acetaminophen (NORCO/VICODIN) 5-325 MG tablet 1 po q hs prn pain 30 tablet 0  . irbesartan (AVAPRO) 150 MG tablet Take 1 tablet (150 mg total) by mouth daily. 90 tablet 3  . metoprolol succinate (TOPROL-XL) 25 MG 24 hr tablet Take 1 tablet (25 mg total) by mouth daily. Need physical exam for further refills 90 tablet 0  . Multiple Vitamin (MULTI VITAMIN DAILY) TABS Take 1 tablet by mouth daily.    . nitroGLYCERIN (NITROSTAT) 0.4 MG SL tablet Place 1 tablet (0.4 mg total) under the tongue every 5 (five) minutes as needed for chest pain. Up to 3 doses. 25 tablet 3  . tamsulosin (FLOMAX) 0.4 MG CAPS capsule Take 1 capsule by mouth daily.  **DUE FOR YEARLY PHYSICAL** 30 capsule 0  . traMADol (ULTRAM) 50 MG tablet Take 1 tablet (50 mg total) by mouth every 12 (twelve) hours as needed. 30 tablet 0   No current facility-administered medications on file prior to visit.    BP (!) 136/92 (BP Location: Left Arm,  Patient Position: Sitting, Cuff Size: Large)   Pulse 88   Temp 97.9 F (36.6 C) (Oral)   Ht 6\' 1"  (1.854 m)   Wt 256 lb 3.2 oz (116.2 kg)   SpO2 95%   BMI 33.80 kg/m       Objective:   Physical Exam Vitals and nursing note reviewed.  Constitutional:      Appearance: Normal appearance. He is obese.  Musculoskeletal:     Right shoulder: No swelling, deformity, tenderness, bony tenderness or crepitus. Decreased range of motion. Normal pulse.     Left shoulder: No swelling, deformity,  tenderness, bony tenderness or crepitus. Decreased range of motion. Normal pulse.     Comments: Is able to complete range of motion with upward arm raise, back scratch test, and empty can test but range of motion is not fluid and he has discomfort with all range of motion exercises.  Skin:    General: Skin is dry.  Neurological:     General: No focal deficit present.     Mental Status: He is alert and oriented to person, place, and time.  Psychiatric:        Mood and Affect: Mood normal.        Thought Content: Thought content normal.        Judgment: Judgment normal.       Assessment & Plan:  1. Chronic pain of both shoulders -Likely arthritic in nature, cannot rule out rotator cuff inflammation, not concerned for rotator cuff tear at this time.  Can consider physical therapy or steroid injections.   - DG Shoulder Left; Future - DG Shoulder Right; Future  Dorothyann Peng, NP

## 2021-02-21 ENCOUNTER — Other Ambulatory Visit: Payer: Self-pay

## 2021-02-22 ENCOUNTER — Ambulatory Visit (INDEPENDENT_AMBULATORY_CARE_PROVIDER_SITE_OTHER): Payer: Medicare Other | Admitting: Adult Health

## 2021-02-22 ENCOUNTER — Encounter: Payer: Self-pay | Admitting: Adult Health

## 2021-02-22 VITALS — BP 138/88 | HR 106 | Temp 98.4°F | Wt 259.4 lb

## 2021-02-22 DIAGNOSIS — M25511 Pain in right shoulder: Secondary | ICD-10-CM | POA: Diagnosis not present

## 2021-02-22 DIAGNOSIS — M25512 Pain in left shoulder: Secondary | ICD-10-CM

## 2021-02-22 DIAGNOSIS — G8929 Other chronic pain: Secondary | ICD-10-CM

## 2021-02-22 MED ORDER — METHYLPREDNISOLONE ACETATE 80 MG/ML IJ SUSP
80.0000 mg | Freq: Once | INTRAMUSCULAR | Status: AC
Start: 2021-02-22 — End: 2021-02-22
  Administered 2021-02-22: 80 mg via INTRA_ARTICULAR

## 2021-02-22 MED ORDER — METHYLPREDNISOLONE ACETATE 80 MG/ML IJ SUSP
80.0000 mg | Freq: Once | INTRAMUSCULAR | Status: AC
  Administered 2021-02-22: 80 mg via INTRA_ARTICULAR

## 2021-02-22 NOTE — Progress Notes (Signed)
Subjective:    Patient ID: Jacob Quinn, male    DOB: 1956-07-25, 65 y.o.   MRN: [id number]  HPI 65 year old male who  has a past medical history of 1st degree AV block, BPH associated with nocturia, CAD in native artery, Encephalitis, viral, Hyperlipidemia (08/07/2016), Hypertension, Ischemic cardiomyopathy, Myocardial infarction ([place]) (2017), OA (osteoarthritis), Obesity (BMI 30-39.9) (08/07/2016), Renal insufficiency, Restless leg syndrome (09/16/2010), Rheumatic fever, and Syncope (2009).  He presents to the office today for follow up regarding chronic shoulder pain.  He was last seen for this about 6 days ago has his shoulder pain has been becoming worse over the last month.  Pain was described as "a dull pain" and was more present when laying down and with certain movements.  X-rays of both shoulder showed bilateral mild AC joint osteoarthritis.  We discussed treatment options including NSAIDs, physical therapy, trying steroid injections.  Today he presents to the office today for steroid injections. He has opted to try both shoulders at the same time    Review of Systems See HPI   Past Medical History:  Diagnosis Date  . 1st degree AV block   . BPH associated with nocturia   . CAD in native artery    a. NSTEMI 07/2016 -  LHC 08/07/16 showing 70% D1, 20% mLAD, segmental 95% then 75% OM stenosis, 85% acute marg, normal LVEDP - received PTCA/DES to OM.  Marland Kitchen Encephalitis, viral   . Hyperlipidemia 08/07/2016  . Hypertension   . Ischemic cardiomyopathy    a. LHC 07/2016 - mild mid anterolateral focal hypocontractility felt to be due to the circumflex stenosis, EF 50%.  . Myocardial infarction ([place]) 2017  . OA (osteoarthritis)   . Obesity (BMI 30-39.9) 08/07/2016  . Renal insufficiency    a. 2016 r/t dehydration/ACEI use.  Marland Kitchen Restless leg syndrome 09/16/2010  . Rheumatic fever   . Syncope 2009   associated with viral URI    Social History   Socioeconomic History  . Marital  status: Single    Spouse name: Not on file  . Number of children: 3  . Years of education: Not on file  . Highest education level: Not on file  Occupational History  . Occupation: English as a second language teacher: [employer]    Comment: [name]  Tobacco Use  . Smoking status: Never Smoker  . Smokeless tobacco: Never Used  Vaping Use  . Vaping Use: Never used  Substance and Sexual Activity  . Alcohol use: No  . Drug use: No  . Sexual activity: Yes  Other Topics Concern  . Not on file  Social History Narrative   Lives with daughter   Is retired -    Social Determinants of Radio broadcast assistant Strain: Not on [employer] Insecurity: Not on file  Transportation Needs: Not on file  Physical Activity: Not on file  Stress: Not on file  Social Connections: Not on file  Intimate Partner Violence: Not on file    Past Surgical History:  Procedure Laterality Date  . CARDIAC CATHETERIZATION N/A 08/07/2016   Procedure: Left Heart Cath and Coronary Angiography;  Surgeon: Troy Sine, MD;  Location: [facility];  Service: Cardiovascular;  Laterality: N/A;  . CARDIAC CATHETERIZATION N/A 08/07/2016   Procedure: Coronary Stent Intervention;  Surgeon: Troy Sine, MD;  Location: [facility];  Service: Cardiovascular;  Laterality: N/A;  . COLONOSCOPY  05/21/2012  . COLONOSCOPY  12/29/2019  .  POLYPECTOMY    . REPLACEMENT TOTAL KNEE     left and right-  5 total   . TONSILLECTOMY    . TOTAL KNEE REVISION Left 11/19/2017   Procedure: LEFT TOTAL KNEE REVISION;  Surgeon: Frederik Pear, MD;  Location: [facility];  Service: Orthopedics;  Laterality: Left;  . TOTAL KNEE REVISION Right 04/22/2018   Procedure: RIGHT TOTAL KNEE REVISION;  Surgeon: Frederik Pear, MD;  Location: [facility];  Service: Orthopedics;  Laterality: Right;  block    Family History  Problem Relation Age of Onset  . Hypertension Mother   . Heart disease Mother   . Prostate cancer Father   . Dementia  Father   . Depression Brother   . Hypertension Brother   . Hypertension Brother   . Stroke Brother   . Colon cancer Neg Hx   . Colon polyps Neg Hx   . Esophageal cancer Neg Hx   . Rectal cancer Neg Hx   . Stomach cancer Neg Hx     Allergies  Allergen Reactions  . Penicillins Anaphylaxis    *Has tolerated cephalosporins in the past*  Has patient had a PCN reaction causing immediate rash, facial/tongue/throat swelling, SOB or lightheadedness with hypotension: Yes Has patient had a PCN reaction causing severe rash involving mucus membranes or skin necrosis: No Has patient had a PCN reaction that required hospitalization: No Has patient had a PCN reaction occurring within the last 10 years: No If all of the above answers are "NO", then may proceed with Cephalosporin use.   . Tizanidine Other (See Comments)    High fevers, shaking  . Latex Rash    Current Outpatient Medications on File Prior to Visit  Medication Sig Dispense Refill  . aspirin 81 MG tablet Take 81 mg by mouth daily.    . Evolocumab (REPATHA SURECLICK) 678 MG/ML SOAJ Inject 140 mg into the skin every 14 (fourteen) days. 1 mL 4  . furosemide (LASIX) 20 MG tablet Take 1 tablet (20 mg total) by mouth daily. MAY TAKE AN ADDITIONAL DOSE FOR ADDITIONAL SWELLING 20 tablet 0  . HYDROcodone-acetaminophen (NORCO/VICODIN) 5-325 MG tablet 1 po q hs prn pain 30 tablet 0  . irbesartan (AVAPRO) 150 MG tablet Take 1 tablet (150 mg total) by mouth daily. 90 tablet 3  . metoprolol succinate (TOPROL-XL) 25 MG 24 hr tablet Take 1 tablet (25 mg total) by mouth daily. Need physical exam for further refills 90 tablet 0  . Multiple Vitamin (MULTI VITAMIN DAILY) TABS Take 1 tablet by mouth daily.    . nitroGLYCERIN (NITROSTAT) 0.4 MG SL tablet Place 1 tablet (0.4 mg total) under the tongue every 5 (five) minutes as needed for chest pain. Up to 3 doses. 25 tablet 3  . tamsulosin (FLOMAX) 0.4 MG CAPS capsule Take 1 capsule by mouth daily.  **DUE  FOR YEARLY PHYSICAL** 90 capsule 0  . traMADol (ULTRAM) 50 MG tablet Take 1 tablet (50 mg total) by mouth every 12 (twelve) hours as needed. 30 tablet 0   No current facility-administered medications on file prior to visit.    BP 138/88 (BP Location: Right Arm, Patient Position: Sitting, Cuff Size: Normal)   Pulse (!) 106   Temp 98.4 F (36.9 C) (Oral)   Wt 259 lb 6.4 oz (117.7 kg)   SpO2 95%   BMI 34.22 kg/m       Objective:   Physical Exam Vitals and nursing note reviewed.  Constitutional:      Appearance: Normal  appearance. He is obese.  Musculoskeletal:     Right shoulder: Bony tenderness present. No swelling, deformity, tenderness or crepitus. Decreased range of motion. Normal strength. Normal pulse.     Left shoulder: Bony tenderness present. No swelling, deformity, tenderness or crepitus. Decreased range of motion. Normal strength. Normal pulse.  Skin:    General: Skin is warm and dry.     Capillary Refill: Capillary refill takes less than 2 seconds.  Neurological:     General: No focal deficit present.     Mental Status: He is oriented to person, place, and time.  Psychiatric:        Mood and Affect: Mood normal.        Behavior: Behavior normal.        Thought Content: Thought content normal.        Judgment: Judgment normal.       Assessment & Plan:  1. Chronic right shoulder pain Shoulder injection Verbal consent obtained and verified. Sterile betadine prep. Furthur cleansed with alcohol. Topical analgesic spray: Ethyl chloride. Joint: right subacromial injection Approached in typical fashion with: posterior approach Completed without difficulty Meds: 3 cc lidocaine 2% no epi, 1 cc depomedrol 80mg /cc Needle:1.5 inch 25 gauge Aftercare instructions and Red flags advised. Immediate improvement in pain noted  - methylPREDNISolone acetate (DEPO-MEDROL) injection 80 mg  2. Chronic left shoulder pain Shoulder injection Verbal consent obtained and  verified. Sterile betadine prep. Furthur cleansed with alcohol. Topical analgesic spray: Ethyl chloride. Joint: left subacromial injection Approached in typical fashion with: posterior approach Completed without difficulty Meds: 3 cc lidocaine 2% no epi, 1 cc depomedrol 80mg /cc Needle:1.5 inch 25 gauge Aftercare instructions and Red flags advised. Immediate improvement in pain noted  - methylPREDNISolone acetate (DEPO-MEDROL) injection 80 mg   Dorothyann Peng, NP

## 2021-03-14 ENCOUNTER — Ambulatory Visit: Payer: Medicare Other | Admitting: Cardiovascular Disease

## 2021-03-19 ENCOUNTER — Other Ambulatory Visit: Payer: Self-pay | Admitting: Physician Assistant

## 2021-03-24 ENCOUNTER — Encounter: Payer: Self-pay | Admitting: Adult Health

## 2021-03-24 ENCOUNTER — Ambulatory Visit (INDEPENDENT_AMBULATORY_CARE_PROVIDER_SITE_OTHER): Payer: Medicare Other | Admitting: Adult Health

## 2021-03-24 ENCOUNTER — Other Ambulatory Visit: Payer: Self-pay

## 2021-03-24 VITALS — BP 160/98 | HR 69 | Temp 98.2°F | Ht 73.0 in | Wt 260.0 lb

## 2021-03-24 DIAGNOSIS — T148XXA Other injury of unspecified body region, initial encounter: Secondary | ICD-10-CM

## 2021-03-24 MED ORDER — CYCLOBENZAPRINE HCL 10 MG PO TABS: 10.0000 mg | ORAL_TABLET | Freq: Three times a day (TID) | ORAL | 0 refills | Status: DC | PRN
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
## 2021-04-08 ENCOUNTER — Telehealth: Payer: Self-pay | Admitting: *Deleted

## 2021-04-08 NOTE — Telephone Encounter (Signed)
Primary Cardiologist:Thomas Claiborne Billings, MD  Chart reviewed as part of pre-operative protocol coverage. Because of AXTYN WOEHLER past medical history and time since last visit, he/she will require a follow-up visit in order to better assess preoperative cardiovascular risk.  Pre-op covering staff: - Please schedule appointment and call patient to inform them. - Please contact requesting surgeon's office via preferred method (i.e, phone, fax) to inform them of need for appointment prior to surgery.  If applicable, this message will also be routed to pharmacy pool and/or primary cardiologist for input on holding anticoagulant/antiplatelet agent as requested below so that this information is available at time of patient's appointment.   Deberah Pelton, NP  04/08/2021, 1:49 PM

## 2021-04-08 NOTE — Telephone Encounter (Signed)
   [facility] Pre-operative Risk Assessment    Patient Name: Jacob Quinn  DOB: 01-06-1956  MRN: [id number]   Request for surgical clearance:  What type of surgery is being performed? LEFT REVISION TOTAL KNEE ARTHROPLASTY    When is this surgery scheduled? TBD   What type of clearance is required (medical clearance vs. Pharmacy clearance to hold med vs. Both)? CLEARANCE   Are there any medications that need to be held prior to surgery and how long?    Practice name and name of physician performing surgery? [facility]    What is the office phone number? [phone]     7.   What is the office fax number? [phone]  8.   Anesthesia type (None, local, MAC, general) ?

## 2021-04-08 NOTE — Telephone Encounter (Signed)
I s/w the pt and advised he is going to need an appt for pre op clearance. Pt is agreeable to appt. I did state to the pt that the schedules are very full and my first is 05/10/21 with getting permission from provider to use a held slot. Pt states he is in pain and he needs his knee surgery. I assured the pt it is not our intention to delay surgery/procedure for our pt's though I did not have any available appts. I assured the pt that I will also put him on a wait list for possible sooner appt. Pt has accepted appt 05/10/21 @ 11:15 with Almyra Deforest, PAC. I will forward notes to [facility] for upcoming appt. Will send FYI to surgeon's office pt has appt 05/10/21.

## 2021-04-12 ENCOUNTER — Other Ambulatory Visit: Payer: Self-pay | Admitting: Adult Health

## 2021-04-12 DIAGNOSIS — I2510 Atherosclerotic heart disease of native coronary artery without angina pectoris: Secondary | ICD-10-CM

## 2021-04-12 DIAGNOSIS — I10 Essential (primary) hypertension: Secondary | ICD-10-CM

## 2021-04-12 DIAGNOSIS — Z9861 Coronary angioplasty status: Secondary | ICD-10-CM

## 2021-04-26 ENCOUNTER — Ambulatory Visit: Payer: Medicare Other

## 2021-04-27 ENCOUNTER — Ambulatory Visit (INDEPENDENT_AMBULATORY_CARE_PROVIDER_SITE_OTHER): Payer: Medicare Other

## 2021-04-27 ENCOUNTER — Other Ambulatory Visit: Payer: Self-pay

## 2021-04-27 DIAGNOSIS — Z0000 Encounter for general adult medical examination without abnormal findings: Secondary | ICD-10-CM | POA: Diagnosis not present

## 2021-04-27 NOTE — Patient Instructions (Signed)
Jacob Quinn , Thank you for taking time to come for your Medicare Wellness Visit. I appreciate your ongoing commitment to your health goals. Please review the following plan we discussed and let me know if I can assist you in the future.   Screening recommendations/referrals: Colonoscopy: Done 12/29/19 repeat  in 3 years  Recommended yearly ophthalmology/optometry visit for glaucoma screening and checkup Recommended yearly dental visit for hygiene and checkup  Vaccinations: Influenza vaccine: Due 05/30/21 Pneumococcal vaccine: Due and discussed Tdap vaccine: Done 03/24/14 repeat in 10 years 03/24/24 Shingles vaccine: Shingrix discussed. Please contact your pharmacy for coverage information.    Covid-19: Completed 2/4, 2/25, & 09/07/20  Advanced directives: Advance directive discussed with you today. Even though you declined this today please call our office should you change your mind and we can give you the proper paperwork for you to fill out.  Conditions/risks identified: none at this time  Next appointment: Follow up in one year for your annual wellness visit.   Preventive Care 67 Years and Older, Male Preventive care refers to lifestyle choices and visits with your health care provider that can promote health and wellness. What does preventive care include? A yearly physical exam. This is also called an annual well check. Dental exams once or twice a year. Routine eye exams. Ask your health care provider how often you should have your eyes checked. Personal lifestyle choices, including: Daily care of your teeth and gums. Regular physical activity. Eating a healthy diet. Avoiding tobacco and drug use. Limiting alcohol use. Practicing safe sex. Taking low doses of aspirin every day. Taking vitamin and mineral supplements as recommended by your health care provider. What happens during an annual well check? The services and screenings done by your health care provider during your  annual well check will depend on your age, overall health, lifestyle risk factors, and family history of disease. Counseling  Your health care provider may ask you questions about your: Alcohol use. Tobacco use. Drug use. Emotional well-being. Home and relationship well-being. Sexual activity. Eating habits. History of falls. Memory and ability to understand (cognition). Work and work Statistician. Screening  You may have the following tests or measurements: Height, weight, and BMI. Blood pressure. Lipid and cholesterol levels. These may be checked every 5 years, or more frequently if you are over 18 years old. Skin check. Lung cancer screening. You may have this screening every year starting at age 67 if you have a 30-pack-year history of smoking and currently smoke or have quit within the past 15 years. Fecal occult blood test (FOBT) of the stool. You may have this test every year starting at age 67. Flexible sigmoidoscopy or colonoscopy. You may have a sigmoidoscopy every 5 years or a colonoscopy every 10 years starting at age 67. Prostate cancer screening. Recommendations will vary depending on your family history and other risks. Hepatitis C blood test. Hepatitis B blood test. Sexually transmitted disease (STD) testing. Diabetes screening. This is done by checking your blood sugar (glucose) after you have not eaten for a while (fasting). You may have this done every 1-3 years. Abdominal aortic aneurysm (AAA) screening. You may need this if you are a current or former smoker. Osteoporosis. You may be screened starting at age 67 if you are at high risk. Talk with your health care provider about your test results, treatment options, and if necessary, the need for more tests. Vaccines  Your health care provider may recommend certain vaccines, such as: Influenza vaccine. This  is recommended every year. Tetanus, diphtheria, and acellular pertussis (Tdap, Td) vaccine. You may need a Td  booster every 10 years. Zoster vaccine. You may need this after age 50. Pneumococcal 13-valent conjugate (PCV13) vaccine. One dose is recommended after age 67. Pneumococcal polysaccharide (PPSV23) vaccine. One dose is recommended after age 65. Talk to your health care provider about which screenings and vaccines you need and how often you need them. This information is not intended to replace advice given to you by your health care provider. Make sure you discuss any questions you have with your health care provider. Document Released: 11/12/2015 Document Revised: 07/05/2016 Document Reviewed: 08/17/2015 Elsevier Interactive Patient Education  2017 [place] Prevention in the Home Falls can cause injuries. They can happen to people of all ages. There are many things you can do to make your home safe and to help prevent falls. What can I do on the outside of my home? Regularly fix the edges of walkways and driveways and fix any cracks. Remove anything that might make you trip as you walk through a door, such as a raised step or threshold. Trim any bushes or trees on the path to your home. Use bright outdoor lighting. Clear any walking paths of anything that might make someone trip, such as rocks or tools. Regularly check to see if handrails are loose or broken. Make sure that both sides of any steps have handrails. Any raised decks and porches should have guardrails on the edges. Have any leaves, snow, or ice cleared regularly. Use sand or salt on walking paths during winter. Clean up any spills in your garage right away. This includes oil or grease spills. What can I do in the bathroom? Use night lights. Install grab bars by the toilet and in the tub and shower. Do not use towel bars as grab bars. Use non-skid mats or decals in the tub or shower. If you need to sit down in the shower, use a plastic, non-slip stool. Keep the floor dry. Clean up any water that spills on the floor  as soon as it happens. Remove soap buildup in the tub or shower regularly. Attach bath mats securely with double-sided non-slip rug tape. Do not have throw rugs and other things on the floor that can make you trip. What can I do in the bedroom? Use night lights. Make sure that you have a light by your bed that is easy to reach. Do not use any sheets or blankets that are too big for your bed. They should not hang down onto the floor. Have a firm chair that has side arms. You can use this for support while you get dressed. Do not have throw rugs and other things on the floor that can make you trip. What can I do in the kitchen? Clean up any spills right away. Avoid walking on wet floors. Keep items that you use a lot in easy-to-reach places. If you need to reach something above you, use a strong step stool that has a grab bar. Keep electrical cords out of the way. Do not use floor polish or wax that makes floors slippery. If you must use wax, use non-skid floor wax. Do not have throw rugs and other things on the floor that can make you trip. What can I do with my stairs? Do not leave any items on the stairs. Make sure that there are handrails on both sides of the stairs and use them. Fix handrails that  are broken or loose. Make sure that handrails are as long as the stairways. Check any carpeting to make sure that it is firmly attached to the stairs. Fix any carpet that is loose or worn. Avoid having throw rugs at the top or bottom of the stairs. If you do have throw rugs, attach them to the floor with carpet tape. Make sure that you have a light switch at the top of the stairs and the bottom of the stairs. If you do not have them, ask someone to add them for you. What else can I do to help prevent falls? Wear shoes that: Do not have high heels. Have rubber bottoms. Are comfortable and fit you well. Are closed at the toe. Do not wear sandals. If you use a stepladder: Make sure that it is  fully opened. Do not climb a closed stepladder. Make sure that both sides of the stepladder are locked into place. Ask someone to hold it for you, if possible. Clearly mark and make sure that you can see: Any grab bars or handrails. First and last steps. Where the edge of each step is. Use tools that help you move around (mobility aids) if they are needed. These include: Canes. Walkers. Scooters. Crutches. Turn on the lights when you go into a dark area. Replace any light bulbs as soon as they burn out. Set up your furniture so you have a clear path. Avoid moving your furniture around. If any of your floors are uneven, fix them. If there are any pets around you, be aware of where they are. Review your medicines with your doctor. Some medicines can make you feel dizzy. This can increase your chance of falling. Ask your doctor what other things that you can do to help prevent falls. This information is not intended to replace advice given to you by your health care provider. Make sure you discuss any questions you have with your health care provider. Document Released: 08/12/2009 Document Revised: 03/23/2016 Document Reviewed: 11/20/2014 Elsevier Interactive Patient Education  2017 [employer].

## 2021-04-27 NOTE — Progress Notes (Signed)
Virtual Visit via Telephone Note  I connected with  Jacob Quinn on 04/27/21 at  8:45 AM EDT by telephone and verified that I am speaking with the correct person using two identifiers.  Medicare Annual Wellness visit completed telephonically due to Covid-19 pandemic.   Persons participating in this call: This Health Coach and this patient.   Location: Patient: Home Provider: Office   I discussed the limitations, risks, security and privacy concerns of performing an evaluation and management service by telephone and the availability of in person appointments. The patient expressed understanding and agreed to proceed.  Unable to perform video visit due to video visit attempted and failed and/or patient does not have video capability.   Some vital signs may be absent or patient reported.   Willette Brace, LPN   Subjective:   Jacob Quinn is a 65 y.o. male who presents for an Initial Medicare Annual Wellness Visit.  Review of Systems     Cardiac Risk Factors include: advanced age (>33men, >77 women);hypertension;dyslipidemia;male gender;obesity (BMI >30kg/m2)     Objective:    Today's Vitals   04/27/21 0816  PainSc: 7    There is no height or weight on file to calculate BMI.  Advanced Directives 04/27/2021 04/22/2018 04/11/2018 11/23/2017 11/20/2017 11/14/2017 09/19/2016  Does Patient Have a Medical Advance Directive? No No No No No No Yes  Would patient like information on creating a medical advance directive? No - Patient declined Yes (MAU/Ambulatory/Procedural Areas - Information given);No - Patient declined Yes (MAU/Ambulatory/Procedural Areas - Information given) No - Patient declined No - Patient declined No - Patient declined -    Current Medications (verified) Outpatient Encounter Medications as of 04/27/2021  Medication Sig   aspirin 81 MG tablet Take 81 mg by mouth daily.   atorvastatin (LIPITOR) 80 MG tablet Take by mouth.   celecoxib (CELEBREX) 200 MG  capsule Take by mouth.   HYDROcodone-acetaminophen (NORCO/VICODIN) 5-325 MG tablet 1 po q hs prn pain   irbesartan (AVAPRO) 150 MG tablet Take 1 tablet (150 mg total) by mouth daily.   metoprolol succinate (TOPROL-XL) 25 MG 24 hr tablet TAKE 1 TABLET (25 MG TOTAL) BY MOUTH DAILY. NEED PHYSICAL EXAM FOR FURTHER REFILLS   Multiple Vitamin (MULTI VITAMIN DAILY) TABS Take 1 tablet by mouth daily.   nitroGLYCERIN (NITROSTAT) 0.4 MG SL tablet Place 1 tablet (0.4 mg total) under the tongue every 5 (five) minutes as needed for chest pain. Up to 3 doses.   REPATHA SURECLICK 742 MG/ML SOAJ INJECT 140 MG INTO THE SKIN EVERY 14 (FOURTEEN) DAYS.   tamsulosin (FLOMAX) 0.4 MG CAPS capsule Take 1 capsule by mouth daily.  **DUE FOR YEARLY PHYSICAL**   cyclobenzaprine (FLEXERIL) 10 MG tablet Take 1 tablet (10 mg total) by mouth 3 (three) times daily as needed for muscle spasms. (Patient not taking: Reported on 04/27/2021)   furosemide (LASIX) 20 MG tablet Take 1 tablet (20 mg total) by mouth daily. MAY TAKE AN ADDITIONAL DOSE FOR ADDITIONAL SWELLING   traMADol (ULTRAM) 50 MG tablet Take 1 tablet (50 mg total) by mouth every 12 (twelve) hours as needed. (Patient not taking: Reported on 04/27/2021)   No facility-administered encounter medications on file as of 04/27/2021.    Allergies (verified) Penicillins, Lisinopril, Tizanidine, and Latex   History: Past Medical History:  Diagnosis Date   1st degree AV block    BPH associated with nocturia    CAD in native artery    a. NSTEMI 07/2016 -  LHC 08/07/16 showing 70% D1, 20% mLAD, segmental 95% then 75% OM stenosis, 85% acute marg, normal LVEDP - received PTCA/DES to OM.   Encephalitis, viral    Hyperlipidemia 08/07/2016   Hypertension    Ischemic cardiomyopathy    a. LHC 07/2016 - mild mid anterolateral focal hypocontractility felt to be due to the circumflex stenosis, EF 50%.   Myocardial infarction ([place]) 2017   OA (osteoarthritis)    Obesity (BMI 30-39.9)  08/07/2016   Renal insufficiency    a. 2016 r/t dehydration/ACEI use.   Restless leg syndrome 09/16/2010   Rheumatic fever    Syncope 2009   associated with viral URI   Past Surgical History:  Procedure Laterality Date   CARDIAC CATHETERIZATION N/A 08/07/2016   Procedure: Left Heart Cath and Coronary Angiography;  Surgeon: Troy Sine, MD;  Location: [facility];  Service: Cardiovascular;  Laterality: N/A;   CARDIAC CATHETERIZATION N/A 08/07/2016   Procedure: Coronary Stent Intervention;  Surgeon: Troy Sine, MD;  Location: [facility];  Service: Cardiovascular;  Laterality: N/A;   COLONOSCOPY  05/21/2012   COLONOSCOPY  12/29/2019   POLYPECTOMY     REPLACEMENT TOTAL KNEE     left and right-  5 total    TONSILLECTOMY     TOTAL KNEE REVISION Left 11/19/2017   Procedure: LEFT TOTAL KNEE REVISION;  Surgeon: Frederik Pear, MD;  Location: [facility];  Service: Orthopedics;  Laterality: Left;   TOTAL KNEE REVISION Right 04/22/2018   Procedure: RIGHT TOTAL KNEE REVISION;  Surgeon: Frederik Pear, MD;  Location: [facility];  Service: Orthopedics;  Laterality: Right;  block   Family History  Problem Relation Age of Onset   Hypertension Mother    Heart disease Mother    Prostate cancer Father    Dementia Father    Depression Brother    Hypertension Brother    Hypertension Brother    Stroke Brother    Colon cancer Neg Hx    Colon polyps Neg Hx    Esophageal cancer Neg Hx    Rectal cancer Neg Hx    Stomach cancer Neg Hx    Social History   Socioeconomic History   Marital status: Single    Spouse name: Not on file   Number of children: 3   Years of education: Not on file   Highest education level: Not on file  Occupational History   Occupation: English as a second language teacher: [employer]    Comment: [name]  Tobacco Use   Smoking status: Never   Smokeless tobacco: Never  Vaping Use   Vaping Use: Never used  Substance and Sexual Activity   Alcohol use: No   Drug  use: No   Sexual activity: Yes  Other Topics Concern   Not on file  Social History Narrative   Lives with daughter   Is retired -    Social Determinants of Radio broadcast assistant Strain: Low Risk    Difficulty of Paying Living Expenses: Not hard at all  Food Insecurity: No Food Insecurity   Worried About Charity fundraiser in the Last Year: Never true   Arboriculturist in the Last Year: Never true  Transportation Needs: No Transportation Needs   Lack of Transportation (Medical): No   Lack of Transportation (Non-Medical): No  Physical Activity: Insufficiently Active   Days of Exercise per Week: 2 days   Minutes of Exercise per Session: 50 min  Stress: No Stress Concern Present   Feeling of Stress : Not at all  Social Connections: Unknown   Frequency of Communication with Friends and Family: More than three times a week   Frequency of Social Gatherings with Friends and Family: More than three times a week   Attends Religious Services: 1 to 4 times per year   Active Member of [employer] or Organizations: No   Attends Music therapist: Never   Marital Status: Not on file    Tobacco Counseling Counseling given: Not Answered   Clinical Intake:  Pre-visit preparation completed: Yes  Pain : 0-10 Pain Score: 7  Pain Type: Chronic pain Pain Location: Knee Pain Orientation: Left Pain Descriptors / Indicators: Aching Pain Onset: More than a month ago Pain Frequency: Intermittent     BMI - recorded: 34.31 Nutritional Status: BMI > 30  Obese Nutritional Risks: Non-healing wound Diabetes: No  How often do you need to have someone help you when you read instructions, pamphlets, or other written materials from your doctor or pharmacy?: 1 - Never  Diabetic?no  Interpreter Needed?: No  Information entered by :: Charlott Rakes, LPN   Activities of Daily Living In your present state of health, do you have any difficulty performing the following activities:  04/27/2021  Hearing? N  Vision? N  Difficulty concentrating or making decisions? N  Walking or climbing stairs? Y  Comment left knee  Dressing or bathing? N  Doing errands, shopping? N  Preparing Food and eating ? N  Using the Toilet? N  In the past six months, have you accidently leaked urine? N  Do you have problems with loss of bowel control? N  Managing your Medications? N  Managing your Finances? N  Housekeeping or managing your Housekeeping? N  Some recent data might be hidden    Patient Care Team: Dorothyann Peng, NP as PCP - General (Family Medicine) Troy Sine, MD as PCP - Cardiology (Cardiology)  Indicate any recent Medical Services you may have received from other than [facility] providers in the past year (date may be approximate).     Assessment:   This is a routine wellness examination for [facility].  Hearing/Vision screen Hearing Screening - Comments:: Pt denies any haring issues  Vision Screening - Comments:: Pt follows up with [facility] for annual exams   Dietary issues and exercise activities discussed: Current Exercise Habits: Structured exercise class, Type of exercise: Other - see comments (PT for left shouder)   Goals Addressed             This Visit's Progress    Patient Stated       Get knee back to good health        Depression Screen PHQ 2/9 Scores 04/27/2021 11/04/2019 01/26/2017 09/25/2016  PHQ - 2 Score 0 0 0 2    Fall Risk Fall Risk  04/27/2021  Falls in the past year? 1  Number falls in past yr: 1  Injury with Fall? 1  Comment knee  Risk for fall due to : Impaired vision  Follow up Falls prevention discussed    FALL RISK PREVENTION PERTAINING TO THE HOME:  Any stairs in or around the home? Yes  If so, are there any without handrails? No  Home free of loose throw rugs in walkways, pet beds, electrical cords, etc? Yes  Adequate lighting in your home to reduce risk of falls? Yes   ASSISTIVE DEVICES UTILIZED TO PREVENT  FALLS:  Life  alert? No  Use of a cane, walker or w/c? Yes  Grab bars in the bathroom? Yes  Shower chair or bench in shower? Yes  Elevated toilet seat or a handicapped toilet? Yes   TIMED UP AND GO:  Was the test performed? No     Cognitive Function:     6CIT Screen 04/27/2021  What Year? 0 points  What month? 0 points  What time? 0 points  Count back from 20 0 points  Months in reverse 0 points  Repeat phrase 0 points  Total Score 0    Immunizations Immunization History  Administered Date(s) Administered   Fluad Quad(high Dose 65+) 11/04/2019   Influenza Split 06/30/2018   Influenza Whole 09/16/2010   Influenza,inj,Quad PF,6+ Mos 10/09/2013, 08/14/2017   Influenza-Unspecified 07/31/2015, 11/24/2019   PFIZER(Purple Top)SARS-COV-2 Vaccination 12/04/2019, 12/25/2019, 09/07/2020   Pneumococcal Conjugate-13 11/04/2019   Tdap 03/24/2014    TDAP status: Up to date  Flu Vaccine status: Up to date  Pneumococcal vaccine status: Due, Education has been provided regarding the importance of this vaccine. Advised may receive this vaccine at local pharmacy or Health Dept. Aware to provide a copy of the vaccination record if obtained from local pharmacy or Health Dept. Verbalized acceptance and understanding.  Covid-19 vaccine status: Completed vaccines  Qualifies for Shingles Vaccine? Yes   Zostavax completed No   Shingrix Completed?: No.    Education has been provided regarding the importance of this vaccine. Patient has been advised to call insurance company to determine out of pocket expense if they have not yet received this vaccine. Advised may also receive vaccine at local pharmacy or Health Dept. Verbalized acceptance and understanding.  Screening Tests Health Maintenance  Topic Date Due   Zoster Vaccines- Shingrix (1 of 2) Never done   PNA vac Low Risk Adult (2 of 2 - PPSV23) 11/03/2020   COVID-19 Vaccine (4 - Booster for Pfizer series) 12/08/2020   INFLUENZA VACCINE   05/30/2021   COLONOSCOPY (Pts 45-40yrs Insurance coverage will need to be confirmed)  12/29/2022   TETANUS/TDAP  03/24/2024   Hepatitis C Screening  Completed   HPV VACCINES  Aged Out    Health Maintenance  Health Maintenance Due  Topic Date Due   Zoster Vaccines- Shingrix (1 of 2) Never done   PNA vac Low Risk Adult (2 of 2 - PPSV23) 11/03/2020   COVID-19 Vaccine (4 - Booster for Pfizer series) 12/08/2020    Colorectal cancer screening: Type of screening: Colonoscopy. Completed 12/29/19. Repeat every 3 years   Additional Screening:  Hepatitis C Screening: Completed 05/21/18  Vision Screening: Recommended annual ophthalmology exams for early detection of glaucoma and other disorders of the eye. Is the patient up to date with their annual eye exam?  Yes  Who is the provider or what is the name of the office in which the patient attends annual eye exams? [facility] If pt is not established with a provider, would they like to be referred to a provider to establish care? No .   Dental Screening: Recommended annual dental exams for proper oral hygiene  Community Resource Referral / Chronic Care Management: CRR required this visit?  No   CCM required this visit?  No      Plan:     I have personally reviewed and noted the following in the patient's chart:   Medical and social history Use of alcohol, tobacco or illicit drugs  Current medications and supplements including opioid prescriptions. Patient is currently  taking opioid prescriptions. Information provided to patient regarding non-opioid alternatives. Patient advised to discuss non-opioid treatment plan with their provider. Functional ability and status Nutritional status Physical activity Advanced directives List of other physicians Hospitalizations, surgeries, and ER visits in previous 12 months Vitals Screenings to include cognitive, depression, and falls Referrals and appointments  In addition, I have reviewed  and discussed with patient certain preventive protocols, quality metrics, and best practice recommendations. A written personalized care plan for preventive services as well as general preventive health recommendations were provided to patient.     Willette Brace, LPN   [id number]/99/[id number]   Nurse Notes: None

## 2021-05-10 ENCOUNTER — Ambulatory Visit: Payer: Medicare Other | Admitting: Physician Assistant

## 2021-05-23 ENCOUNTER — Other Ambulatory Visit: Payer: Self-pay | Admitting: Adult Health

## 2021-05-23 DIAGNOSIS — N401 Enlarged prostate with lower urinary tract symptoms: Secondary | ICD-10-CM

## 2021-08-21 ENCOUNTER — Other Ambulatory Visit: Payer: Self-pay | Admitting: Adult Health

## 2021-08-21 DIAGNOSIS — N401 Enlarged prostate with lower urinary tract symptoms: Secondary | ICD-10-CM

## 2021-11-16 ENCOUNTER — Telehealth: Payer: Self-pay | Admitting: Physical Medicine and Rehabilitation

## 2021-11-16 NOTE — Telephone Encounter (Signed)
Patient called. He would like an appointment with Dr. Ernestina Patches. His call back number is [phone]

## 2021-11-20 ENCOUNTER — Other Ambulatory Visit: Payer: Self-pay | Admitting: Adult Health

## 2021-11-20 DIAGNOSIS — N401 Enlarged prostate with lower urinary tract symptoms: Secondary | ICD-10-CM

## 2021-11-20 DIAGNOSIS — R351 Nocturia: Secondary | ICD-10-CM

## 2021-12-06 ENCOUNTER — Ambulatory Visit: Payer: Self-pay

## 2021-12-06 ENCOUNTER — Other Ambulatory Visit: Payer: Self-pay

## 2021-12-06 ENCOUNTER — Encounter: Payer: Self-pay | Admitting: Physical Medicine and Rehabilitation

## 2021-12-06 ENCOUNTER — Ambulatory Visit (INDEPENDENT_AMBULATORY_CARE_PROVIDER_SITE_OTHER): Payer: Medicare Other | Admitting: Physical Medicine and Rehabilitation

## 2021-12-06 VITALS — BP 158/94 | HR 80

## 2021-12-06 DIAGNOSIS — M25552 Pain in left hip: Secondary | ICD-10-CM

## 2021-12-06 DIAGNOSIS — M5442 Lumbago with sciatica, left side: Secondary | ICD-10-CM | POA: Diagnosis not present

## 2021-12-06 DIAGNOSIS — M48061 Spinal stenosis, lumbar region without neurogenic claudication: Secondary | ICD-10-CM

## 2021-12-06 DIAGNOSIS — M5416 Radiculopathy, lumbar region: Secondary | ICD-10-CM

## 2021-12-06 DIAGNOSIS — G8929 Other chronic pain: Secondary | ICD-10-CM

## 2021-12-06 MED ORDER — METHYLPREDNISOLONE ACETATE 80 MG/ML IJ SUSP
80.0000 mg | Freq: Once | INTRAMUSCULAR | Status: AC
  Administered 2021-12-06: 80 mg

## 2021-12-06 NOTE — Progress Notes (Signed)
Pt states lower back pain. 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 Pt states laying down help the pressuere off his back makes better.Pt state he takes over the counter pain meds to help ease his pain.  Numeric Pain Rating Scale and Functional Assessment Average Pain 7   In the last MONTH (on 0-10 scale) has pain interfered with the following?  1. General activity like being  able to carry out your everyday physical activities such as walking, climbing stairs, carrying groceries, or moving a chair?  Rating(9)   +Driver, -BT, -Dye Allergies.

## 2021-12-06 NOTE — Patient Instructions (Signed)

## 2021-12-06 NOTE — Progress Notes (Signed)
Jacob Quinn - 66 y.o. male MRN [id number]  Date of birth: Oct 19, 1956  Office Visit Note: Visit Date: 12/06/2021 PCP: Dorothyann Peng, NP Referred by: Dorothyann Peng, NP  Subjective: Chief Complaint  Patient presents with   Lower Back - Pain   HPI: Jacob Quinn is a 66 y.o. male who comes in today for evaluation and management of continued chronic severe recalcitrant left more than right lower back pain some referral into the thigh.  Patient continues to be followed from an orthopedic standpoint by Dr. Anderson Malta in our office.  Patient's had bilateral knee replacements and history of back pain chronically for many years.  He is also followed by his primary provider Sallee Provencal, NP.  Patient does take hydrocodone and tramadol at times for pain relief and this helps to some degree.  His case is complicated by coronary artery disease as well as cardiomyopathy and heart failure.  He has had prior lumbar spine injections on the left at L3 and L4 Duda foraminal narrowing and arthritic changes.  MRI from 2016.  I have seen the patient over the years very sporadically.  Last injection was in 2021 and prior to that was in 2019.  He reports similar area of pain just worsening over the last several months.  He has tried home exercises and stretching as well as medications without relief.  He feels like the pain is very similar although he is feeling and across the back more on both sides.  He has not had any red flag complaints of fevers chills or night sweats.  No unintended weight loss.  No specific trauma or falls.  Severe pain rated 7 out of 10 and does affect his daily living.     Review of Systems  Musculoskeletal:  Positive for back pain and joint pain.  All other systems reviewed and are negative. Otherwise per HPI.  Assessment & Plan: Visit Diagnoses:    ICD-10-CM   1. Lumbar radiculopathy  M54.16 XR C-ARM NO REPORT    Epidural Steroid injection    methylPREDNISolone acetate  (DEPO-MEDROL) injection 80 mg    2. Foraminal stenosis of lumbar region  M48.061     3. Pain in left hip  M25.552     4. Chronic left-sided low back pain with left-sided sciatica  M54.42    G89.29        Plan: Findings:  Chronic severe recalcitrant low back pain worsening over the last several months without specific injury or inciting event.  No red flag complaints I feel like this is a combination of facet arthropathy and spondylosis as well as history of foraminal narrowing without history of central canal stenosis but MRI is from 2016.  He really does take mild amount of chronic opioid medication for pain relief and this is just not helping.  I think this is a flareup of his normal pain that he is having but it could be related to ongoing degeneration of the lumbar spine.  At this point I will complete diagnostic left L3 and L4 transforaminal injection because this is helped him greatly in the past he has had these very infrequently.  Depending on relief would look at MRI of the lumbar spine to see if there is anything of a target that we could look at.  His symptoms are consistent also with facet mediated pain we could look at this as well.  If he with home exercise program we could regroup with physical therapy as  well.   Meds & Orders:  Meds ordered this encounter  Medications   methylPREDNISolone acetate (DEPO-MEDROL) injection 80 mg    Orders Placed This Encounter  Procedures   XR C-ARM NO REPORT   Epidural Steroid injection    Follow-up: Return if symptoms worsen or fail to improve.   Procedures: No procedures performed  Lumbosacral Transforaminal Epidural Steroid Injection - Sub-Pedicular Approach with Fluoroscopic Guidance  Patient: Jacob Quinn      Date of Birth: Mar 08, 1956 MRN: [id number] PCP: Dorothyann Peng, NP      Visit Date: 12/06/2021   Universal Protocol:    Date/Time: 12/06/2021  Consent Given By: the patient  Position: PRONE  Additional  Comments: Vital signs were monitored before and after the procedure. Patient was prepped and draped in the usual sterile fashion. The correct patient, procedure, and site was verified.   Injection Procedure Details:   Procedure diagnoses: Lumbar radiculopathy [M54.16]    Meds Administered:  Meds ordered this encounter  Medications   methylPREDNISolone acetate (DEPO-MEDROL) injection 80 mg    Laterality: Left  Location/Site: L3 and L4  Needle:5.0 in., 22 ga.  Short bevel or Quincke spinal needle  Needle Placement: Transforaminal  Findings:    -Comments: Excellent flow of contrast along the nerve, nerve root and into the epidural space.  Procedure Details: After squaring off the end-plates to get a true AP view, the C-arm was positioned so that an oblique view of the foramen as noted above was visualized. The target area is just inferior to the "nose of the scotty dog" or sub pedicular. The soft tissues overlying this structure were infiltrated with 2-3 ml. of 1% Lidocaine without Epinephrine.  The spinal needle was inserted toward the target using a "trajectory" view along the fluoroscope beam.  Under AP and lateral visualization, the needle was advanced so it did not puncture dura and was located close the 6 O'Clock position of the pedical in AP tracterory. Biplanar projections were used to confirm position. Aspiration was confirmed to be negative for CSF and/or blood. A 1-2 ml. volume of Isovue-250 was injected and flow of contrast was noted at each level. Radiographs were obtained for documentation purposes.   After attaining the desired flow of contrast documented above, a 0.5 to 1.0 ml test dose of 0.25% Marcaine was injected into each respective transforaminal space.  The patient was observed for 90 seconds post injection.  After no sensory deficits were reported, and normal lower extremity motor function was noted,   the above injectate was administered so that equal amounts of  the injectate were placed at each foramen (level) into the transforaminal epidural space.   Additional Comments:  The patient tolerated the procedure well Dressing: 2 x 2 sterile gauze and Band-Aid    Post-procedure details: Patient was observed during the procedure. Post-procedure instructions were reviewed.  Patient left the clinic in stable condition.     Clinical History: Lumbar Spine MRI 06/03/2015 [facility]  L3-4 bilateral facet arthropathy with moderate central canal stenosis and right more than left lateral recess narrowing.  L4-5 bilateral facet arthropathy and severe left foraminal narrowing.  Mild central canal stenosis.  Disc herniation more left-sided.   He reports that he has never smoked. He has never used smokeless tobacco. No results for input(s): HGBA1C, LABURIC in the last 8760 hours.  Objective:  VS:  HT:     WT:    BMI:      BP:(!) 158/94   HR:80bpm   TEMP: ( )  RESP:  Physical Exam Vitals and nursing note reviewed.  Constitutional:      General: He is not in acute distress.    Appearance: Normal appearance. He is not ill-appearing.  HENT:     Head: Normocephalic and atraumatic.     Right Ear: External ear normal.     Left Ear: External ear normal.     Nose: No congestion.  Eyes:     Extraocular Movements: Extraocular movements intact.  Cardiovascular:     Rate and Rhythm: Normal rate.     Pulses: Normal pulses.  Pulmonary:     Effort: Pulmonary effort is normal. No respiratory distress.  Abdominal:     General: There is no distension.     Palpations: Abdomen is soft.  Musculoskeletal:        General: No tenderness or signs of injury.     Cervical back: Neck supple.     Right lower leg: No edema.     Left lower leg: No edema.     Comments: Patient has good distal strength without clonus.Patient somewhat slow to rise from a seated position to full extension.  There is concordant low back pain with facet loading and lumbar spine extension rotation.   There are no definitive trigger points but the patient is somewhat tender across the lower back and PSIS.  There is no pain with hip rotation.   Skin:    Findings: No erythema or rash.  Neurological:     General: No focal deficit present.     Mental Status: He is alert and oriented to person, place, and time.     Sensory: No sensory deficit.     Motor: No weakness or abnormal muscle tone.     Coordination: Coordination normal.  Psychiatric:        Mood and Affect: Mood normal.        Behavior: Behavior normal.    Ortho Exam  Imaging: XR C-ARM NO REPORT  Result Date: 12/06/2021 Please see Notes tab for imaging impression.   Past Medical/Family/Surgical/Social History: Medications & Allergies reviewed per EMR, new medications updated. 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 NSTEMI 07/2016 -  LHC 08/07/16 showing 70% D1, 20% mLAD, segmental 95% then 75% OM stenosis, 85% acute marg, normal LVEDP - received PTCA/DES to OM.   Encephalitis, viral    Hyperlipidemia 08/07/2016   Hypertension    Ischemic cardiomyopathy     a. LHC 07/2016 - mild mid anterolateral focal hypocontractility felt to be due to the circumflex stenosis, EF 50%.   Myocardial infarction ([place]) 2017   OA (  osteoarthritis)    Obesity (BMI 30-39.9) 08/07/2016   Renal insufficiency    a. 2016 r/t dehydration/ACEI use.   Restless leg syndrome 09/16/2010   Rheumatic fever    Syncope 2009   associated with viral URI   Family History  Problem Relation Age of Onset   Hypertension Mother    Heart disease Mother    Prostate cancer Father    Dementia Father    Depression Brother    Hypertension Brother    Hypertension Brother    Stroke Brother    Colon cancer Neg Hx    Colon polyps Neg Hx    Esophageal cancer Neg Hx    Rectal cancer Neg Hx    Stomach cancer Neg Hx    Past Surgical History:  Procedure Laterality Date   CARDIAC CATHETERIZATION N/A 08/07/2016   Procedure: Left Heart Cath and Coronary Angiography;  Surgeon: Troy Sine, MD;  Location: [facility];  Service: Cardiovascular;  Laterality: N/A;   CARDIAC CATHETERIZATION N/A 08/07/2016   Procedure: Coronary Stent Intervention;  Surgeon: Troy Sine, MD;  Location: [facility];  Service: Cardiovascular;  Laterality: N/A;   COLONOSCOPY  05/21/2012   COLONOSCOPY  12/29/2019   POLYPECTOMY     REPLACEMENT TOTAL KNEE     left and right-  5 total    TONSILLECTOMY     TOTAL KNEE REVISION Left 11/19/2017   Procedure: LEFT TOTAL KNEE REVISION;  Surgeon: Frederik Pear, MD;  Location: [facility];  Service: Orthopedics;  Laterality: Left;   TOTAL KNEE REVISION Right 04/22/2018   Procedure: RIGHT TOTAL KNEE REVISION;  Surgeon: Frederik Pear, MD;  Location: [facility];  Service: Orthopedics;  Laterality: Right;  block   Social History   Occupational History   Occupation: English as a second language teacher: [employer]    Comment: [name]  Tobacco Use   Smoking status: Never   Smokeless tobacco: Never  Vaping Use   Vaping Use: Never used  Substance and Sexual Activity    Alcohol use: No   Drug use: No   Sexual activity: Yes

## 2021-12-07 DIAGNOSIS — I2510 Atherosclerotic heart disease of native coronary artery without angina pectoris: Secondary | ICD-10-CM | POA: Insufficient documentation

## 2021-12-07 NOTE — Procedures (Signed)
Lumbosacral Transforaminal Epidural Steroid Injection - Sub-Pedicular Approach with Fluoroscopic Guidance  Patient: Jacob Quinn      Date of Birth: Aug 19, 1956 MRN: [id number] PCP: Dorothyann Peng, NP      Visit Date: 12/06/2021   Universal Protocol:    Date/Time: 12/06/2021  Consent Given By: the patient  Position: PRONE  Additional Comments: Vital signs were monitored before and after the procedure. Patient was prepped and draped in the usual sterile fashion. The correct patient, procedure, and site was verified.   Injection Procedure Details:   Procedure diagnoses: Lumbar radiculopathy [M54.16]    Meds Administered:  Meds ordered this encounter  Medications   methylPREDNISolone acetate (DEPO-MEDROL) injection 80 mg    Laterality: Left  Location/Site: L3 and L4  Needle:5.0 in., 22 ga.  Short bevel or Quincke spinal needle  Needle Placement: Transforaminal  Findings:    -Comments: Excellent flow of contrast along the nerve, nerve root and into the epidural space.  Procedure Details: After squaring off the end-plates to get a true AP view, the C-arm was positioned so that an oblique view of the foramen as noted above was visualized. The target area is just inferior to the "nose of the scotty dog" or sub pedicular. The soft tissues overlying this structure were infiltrated with 2-3 ml. of 1% Lidocaine without Epinephrine.  The spinal needle was inserted toward the target using a "trajectory" view along the fluoroscope beam.  Under AP and lateral visualization, the needle was advanced so it did not puncture dura and was located close the 6 O'Clock position of the pedical in AP tracterory. Biplanar projections were used to confirm position. Aspiration was confirmed to be negative for CSF and/or blood. A 1-2 ml. volume of Isovue-250 was injected and flow of contrast was noted at each level. Radiographs were obtained for documentation purposes.   After attaining the  desired flow of contrast documented above, a 0.5 to 1.0 ml test dose of 0.25% Marcaine was injected into each respective transforaminal space.  The patient was observed for 90 seconds post injection.  After no sensory deficits were reported, and normal lower extremity motor function was noted,   the above injectate was administered so that equal amounts of the injectate were placed at each foramen (level) into the transforaminal epidural space.   Additional Comments:  The patient tolerated the procedure well Dressing: 2 x 2 sterile gauze and Band-Aid    Post-procedure details: Patient was observed during the procedure. Post-procedure instructions were reviewed.  Patient left the clinic in stable condition.

## 2021-12-29 ENCOUNTER — Other Ambulatory Visit: Payer: Self-pay | Admitting: Adult Health

## 2021-12-29 DIAGNOSIS — N401 Enlarged prostate with lower urinary tract symptoms: Secondary | ICD-10-CM

## 2022-03-29 ENCOUNTER — Other Ambulatory Visit: Payer: Self-pay | Admitting: Adult Health

## 2022-03-29 DIAGNOSIS — N401 Enlarged prostate with lower urinary tract symptoms: Secondary | ICD-10-CM

## 2022-05-02 ENCOUNTER — Other Ambulatory Visit: Payer: Self-pay | Admitting: Adult Health

## 2022-05-02 DIAGNOSIS — N401 Enlarged prostate with lower urinary tract symptoms: Secondary | ICD-10-CM

## 2022-05-03 NOTE — Telephone Encounter (Signed)
Patient need to schedule an ov for more refills. 

## 2022-05-10 ENCOUNTER — Ambulatory Visit: Payer: Medicare Other

## 2022-05-10 ENCOUNTER — Telehealth: Payer: Self-pay

## 2022-05-10 NOTE — Telephone Encounter (Signed)
Contacted patient on preferred number listed in notes. Patient stated unable to complete AWV visit will call back to reschedule.

## 2022-05-12 ENCOUNTER — Encounter: Payer: Self-pay | Admitting: Adult Health

## 2022-05-12 ENCOUNTER — Ambulatory Visit (INDEPENDENT_AMBULATORY_CARE_PROVIDER_SITE_OTHER): Payer: Medicare Other | Admitting: Adult Health

## 2022-05-12 VITALS — BP 130/70 | HR 55 | Temp 98.0°F | Ht 72.0 in | Wt 274.0 lb

## 2022-05-12 DIAGNOSIS — E669 Obesity, unspecified: Secondary | ICD-10-CM | POA: Diagnosis not present

## 2022-05-12 DIAGNOSIS — E66812 Obesity, class 2: Secondary | ICD-10-CM

## 2022-05-12 NOTE — Progress Notes (Signed)
Subjective:    Patient ID: Jacob Quinn, male    DOB: Dec 26, 1955, 66 y.o.   MRN: [id number]  HPI  66 year old male who  has a past medical history of 1st degree AV block, BPH associated with nocturia, CAD in native artery, Encephalitis, viral, Hyperlipidemia (08/07/2016), Hypertension, Ischemic cardiomyopathy, Myocardial infarction ([place]) (2017), OA (osteoarthritis), Obesity (BMI 30-39.9) (08/07/2016), Renal insufficiency, Restless leg syndrome (09/16/2010), Rheumatic fever, and Syncope (2009).  He presents to the office today for weight loss management.  He had a few friends go through the program at the weight loss center and he would like to give it a try.  He reports that his self-control has gotten out of hand" and he wants to learn the right way to lose weight.  Has been able to lose weight in the past but ended up gaining all of the weight back.  Review of Systems See HPI   Past Medical History:  Diagnosis Date   1st degree AV block    BPH associated with nocturia    CAD in native artery    a. NSTEMI 07/2016 -  LHC 08/07/16 showing 70% D1, 20% mLAD, segmental 95% then 75% OM stenosis, 85% acute marg, normal LVEDP - received PTCA/DES to OM.   Encephalitis, viral    Hyperlipidemia 08/07/2016   Hypertension    Ischemic cardiomyopathy    a. LHC 07/2016 - mild mid anterolateral focal hypocontractility felt to be due to the circumflex stenosis, EF 50%.   Myocardial infarction ([place]) 2017   OA (osteoarthritis)    Obesity (BMI 30-39.9) 08/07/2016   Renal insufficiency    a. 2016 r/t dehydration/ACEI use.   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[employer]     Comment: [name]  Tobacco Use   Smoking status: Never   Smokeless tobacco: Never  Vaping Use   Vaping Use: Never used  Substance and Sexual Activity   Alcohol use: No   Drug use: No   Sexual activity: Yes  Other Topics Concern   Not on file  Social History Narrative   Lives with daughter   Is retired -    Social Determinants of Health   Financial Resource Strain: [place]  (04/27/2021)   Overall Financial Resource Strain (CARDIA)    Difficulty of Paying Living Expenses: Not hard at all  Food Insecurity: No [place] (04/27/2021)   Hunger Vital Sign    Worried About Running Out of Food in the Last Year: Never true    [place] in the Last Year: Never true  Transportation Needs: No Transportation Needs (04/27/2021)   PRAPARE - Hydrologist (Medical): No    Lack of Transportation (Non-Medical): No  Physical Activity: Insufficiently Active (04/27/2021)   Exercise Vital Sign    Days of Exercise per Week: 2 days    Minutes of Exercise per Session: 50 min  Stress: No Stress Concern Present (04/27/2021)   [place]    Feeling of Stress : Not at all  Social Connections: Unknown (04/27/2021)   Social Connection and Isolation Panel [NHANES]    Frequency of Communication with Friends and Family: More than three times a week    Frequency of Social Gatherings with Friends and Family: More than three times a week    Attends Religious Services: 1 to 4 times per year    Active Member of [employer] or Organizations: No    Attends Archivist Meetings: Never    Marital Status: Not on file  Intimate Partner Violence: Not At Risk (04/27/2021)   Humiliation, Afraid, Rape, and Kick questionnaire    Fear of Current or Ex-Partner: No    Emotionally Abused: No    Physically Abused: No    Sexually Abused: No    Past Surgical History:  Procedure Laterality Date   CARDIAC  CATHETERIZATION N/A 08/07/2016   Procedure: Left Heart Cath and Coronary Angiography;  Surgeon: Troy Sine, MD;  Location: [facility];  Service: Cardiovascular;  Laterality: N/A;   CARDIAC CATHETERIZATION N/A 08/07/2016   Procedure: Coronary Stent Intervention;  Surgeon: Troy Sine, MD;  Location: [facility];  Service: Cardiovascular;  Laterality: N/A;   COLONOSCOPY  05/21/2012   COLONOSCOPY  12/29/2019   POLYPECTOMY     REPLACEMENT TOTAL KNEE     left and right-  5 total    TONSILLECTOMY     TOTAL KNEE REVISION Left 11/19/2017   Procedure: LEFT TOTAL KNEE REVISION;  Surgeon: Frederik Pear, MD;  Location: [facility];  Service: Orthopedics;  Laterality: Left;   TOTAL KNEE REVISION Right 04/22/2018   Procedure: RIGHT TOTAL KNEE REVISION;  Surgeon: Frederik Pear, MD;  [facility];  Service: Orthopedics;  Laterality: Right;  block    Family History  Problem Relation Age of Onset   Hypertension Mother    Heart disease Mother    Prostate cancer Father    Dementia Father    Depression Brother    Hypertension Brother    Hypertension Brother    Stroke Brother    Colon cancer Neg Hx    Colon polyps Neg Hx    Esophageal cancer Neg Hx    Rectal cancer Neg Hx    Stomach cancer Neg Hx     Allergies  Allergen Reactions   Penicillins Anaphylaxis    *Has tolerated cephalosporins in the past*  Has patient had a PCN reaction causing immediate rash, facial/tongue/throat swelling, SOB or lightheadedness with hypotension: Yes Has patient had a PCN reaction causing severe rash involving mucus membranes or skin necrosis: No Has patient had a PCN reaction that required hospitalization: No Has patient had a PCN reaction occurring within the last 10 years: No If all of the above answers are "NO", then may proceed with Cephalosporin use.    Lisinopril     Cramp    Tizanidine Other (See Comments)    High fevers, shaking   Latex Rash    Current Outpatient Medications on File  Prior to Visit  Medication Sig Dispense Refill   aspirin 81 MG tablet Take 81 mg by mouth daily.     atorvastatin (LIPITOR) 80 MG tablet Take by mouth.     celecoxib (CELEBREX) 200 MG capsule Take by mouth.     cyclobenzaprine (FLEXERIL) 10 MG tablet Take 1 tablet (10 mg total) by mouth 3 (three) times daily as needed for muscle spasms. 30 tablet 0   furosemide (LASIX) 20 MG tablet Take 1 tablet (20 mg total) by mouth daily.  MAY TAKE AN ADDITIONAL DOSE FOR ADDITIONAL SWELLING 20 tablet 0   irbesartan (AVAPRO) 150 MG tablet Take 1 tablet (150 mg total) by mouth daily. 90 tablet 3   metoprolol succinate (TOPROL-XL) 25 MG 24 hr tablet TAKE 1 TABLET (25 MG TOTAL) BY MOUTH DAILY. NEED PHYSICAL EXAM FOR FURTHER REFILLS 90 tablet 0   Multiple Vitamin (MULTI VITAMIN DAILY) TABS Take 1 tablet by mouth daily.     nitroGLYCERIN (NITROSTAT) 0.4 MG SL tablet Place 1 tablet (0.4 mg total) under the tongue every 5 (five) minutes as needed for chest pain. Up to 3 doses. 25 tablet 3   REPATHA SURECLICK 696 MG/ML SOAJ INJECT 140 MG INTO THE SKIN EVERY 14 (FOURTEEN) DAYS. 2 mL 11   tamsulosin (FLOMAX) 0.4 MG CAPS capsule TAKE 1 CAPSULE BY MOUTH DAILY. **DUE FOR YEARLY PHYSICAL** 90 capsule 0   No current facility-administered medications on file prior to visit.    BP 130/70   Pulse (!) 55   Temp 98 F (36.7 C) (Oral)   Ht 6' (1.829 m)   Wt 274 lb (124.3 kg)   SpO2 97%   BMI 37.16 kg/m       Objective:   Physical Exam Vitals and nursing note reviewed.  Constitutional:      Appearance: Normal appearance. He is obese.  Musculoskeletal:        General: Normal range of motion.  Skin:    General: Skin is warm and dry.  Neurological:     General: No focal deficit present.     Mental Status: He is alert and oriented to person, place, and time.  Psychiatric:        Mood and Affect: Mood normal.        Behavior: Behavior normal.        Thought Content: Thought content normal.        Judgment:  Judgment normal.       Assessment & Plan:   1. Class 2 obesity  - Amb Ref to Medical Weight Management

## 2022-05-22 ENCOUNTER — Ambulatory Visit (INDEPENDENT_AMBULATORY_CARE_PROVIDER_SITE_OTHER): Payer: Medicare Other

## 2022-05-22 VITALS — Ht 72.0 in | Wt 274.0 lb

## 2022-05-22 DIAGNOSIS — Z0000 Encounter for general adult medical examination without abnormal findings: Secondary | ICD-10-CM | POA: Diagnosis not present

## 2022-05-22 NOTE — Progress Notes (Signed)
Subjective:   Jacob Quinn is a 66 y.o. male who presents for Medicare Annual/Subsequent preventive examination.  Review of Systems    Virtual Visit via Telephone Note  I connected with  Jacob Quinn on 05/22/22 at  9:15 AM EDT by telephone and verified that I am speaking with the correct person using two identifiers.  Location: Patient: Home Provider: Office Persons participating in the virtual visit: patient/Nurse Health Advisor   I discussed the limitations, risks, security and privacy concerns of performing an evaluation and management service by telephone and the availability of in person appointments. The patient expressed understanding and agreed to proceed.  Interactive audio and video telecommunications were attempted between this nurse and patient, however failed, due to patient having technical difficulties OR patient did not have access to video capability.  We continued and completed visit with audio only.  Some vital signs may be absent or patient reported.   Criselda Peaches, LPN  Cardiac Risk Factors include: advanced age (>86mn, >>53women);hypertension;male gender     Objective:    Today's Vitals   05/22/22 0920  Weight: 274 lb (124.3 kg)  Height: 6' (1.829 m)   Body mass index is 37.16 kg/m.     05/22/2022    9:28 AM 04/27/2021    8:56 AM 04/22/2018   11:30 AM 04/11/2018   11:40 AM 11/23/2017    3:26 AM 11/20/2017    2:00 AM 11/14/2017    8:34 AM  Advanced Directives  Does Patient Have a Medical Advance Directive? No No No No No No No  Would patient like information on creating a medical advance directive? No - Patient declined No - Patient declined Yes (MAU/Ambulatory/Procedural Areas - Information given);No - Patient declined Yes (MAU/Ambulatory/Procedural Areas - Information given) No - Patient declined No - Patient declined No - Patient declined    Current Medications (verified) Outpatient Encounter Medications as of 05/22/2022  Medication  Sig   aspirin 81 MG tablet Take 81 mg by mouth daily.   atorvastatin (LIPITOR) 80 MG tablet Take by mouth.   celecoxib (CELEBREX) 200 MG capsule Take by mouth.   cyclobenzaprine (FLEXERIL) 10 MG tablet Take 1 tablet (10 mg total) by mouth 3 (three) times daily as needed for muscle spasms.   furosemide (LASIX) 20 MG tablet Take 1 tablet (20 mg total) by mouth daily. MAY TAKE AN ADDITIONAL DOSE FOR ADDITIONAL SWELLING   irbesartan (AVAPRO) 150 MG tablet Take 1 tablet (150 mg total) by mouth daily.   metoprolol succinate (TOPROL-XL) 25 MG 24 hr tablet TAKE 1 TABLET (25 MG TOTAL) BY MOUTH DAILY. NEED PHYSICAL EXAM FOR FURTHER REFILLS   Multiple Vitamin (MULTI VITAMIN DAILY) TABS Take 1 tablet by mouth daily.   nitroGLYCERIN (NITROSTAT) 0.4 MG SL tablet Place 1 tablet (0.4 mg total) under the tongue every 5 (five) minutes as needed for chest pain. Up to 3 doses.   REPATHA SURECLICK 1323MG/ML SOAJ INJECT 140 MG INTO THE SKIN EVERY 14 (FOURTEEN) DAYS.   tamsulosin (FLOMAX) 0.4 MG CAPS capsule TAKE 1 CAPSULE BY MOUTH DAILY. **DUE FOR YEARLY PHYSICAL**   No facility-administered encounter medications on file as of 05/22/2022.    Allergies (verified) Penicillins, Lisinopril, Tizanidine, and Latex   History: Past Medical History:  Diagnosis Date   1st degree AV block    BPH associated with nocturia    CAD in native artery    a. NSTEMI 07/2016 -  LHC 08/07/16 showing 70% D1, 20% mLAD, segmental  95% then 75% OM stenosis, 85% acute marg, normal LVEDP - received PTCA/DES to OM.   Encephalitis, viral    Hyperlipidemia 08/07/2016   Hypertension    Ischemic cardiomyopathy    a. LHC 07/2016 - mild mid anterolateral focal hypocontractility felt to be due to the circumflex stenosis, EF 50%.   Myocardial infarction ([place]) 2017   OA (osteoarthritis)    Obesity (BMI 30-39.9) 08/07/2016   Renal insufficiency    a. 2016 r/t dehydration/ACEI use.   Restless leg syndrome 09/16/2010   Rheumatic fever    Syncope  2009   associated with viral URI   Past Surgical History:  Procedure Laterality Date   CARDIAC CATHETERIZATION N/A 08/07/2016   Procedure: Left Heart Cath and Coronary Angiography;  Surgeon: Troy Sine, MD;  Location: [facility];  Service: Cardiovascular;  Laterality: N/A;   CARDIAC CATHETERIZATION N/A 08/07/2016   Procedure: Coronary Stent Intervention;  Surgeon: Troy Sine, MD;  Location: [facility];  Service: Cardiovascular;  Laterality: N/A;   COLONOSCOPY  05/21/2012   COLONOSCOPY  12/29/2019   POLYPECTOMY     REPLACEMENT TOTAL KNEE     left and right-  5 total    TONSILLECTOMY     TOTAL KNEE REVISION Left 11/19/2017   Procedure: LEFT TOTAL KNEE REVISION;  Surgeon: Frederik Pear, MD;  Location: [facility];  Service: Orthopedics;  Laterality: Left;   TOTAL KNEE REVISION Right 04/22/2018   Procedure: RIGHT TOTAL KNEE REVISION;  Surgeon: Frederik Pear, MD;  Location: [facility];  Service: Orthopedics;  Laterality: Right;  block   Family History  Problem Relation Age of Onset   Hypertension Mother    Heart disease Mother    Prostate cancer Father    Dementia Father    Depression Brother    Hypertension Brother    Hypertension Brother    Stroke Brother    Colon cancer Neg Hx    Colon polyps Neg Hx    Esophageal cancer Neg Hx    Rectal cancer Neg Hx    Stomach cancer Neg Hx    Social History   Socioeconomic History   Marital status: Single    Spouse name: Not on file   Number of children: 3   Years of education: Not on file   Highest education level: Not on file  Occupational History   Occupation: English as a second language teacher: [employer]    Comment: [name]  Tobacco Use   Smoking status: Never   Smokeless tobacco: Never  Vaping Use   Vaping Use: Never used  Substance and Sexual Activity   Alcohol use: No   Drug use: No   Sexual activity: Yes  Other Topics Concern   Not on file  Social History Narrative   Lives with daughter   Is retired -     Social Determinants of Health   Financial Resource Strain: [place]  (05/22/2022)   Overall Financial Resource Strain (CARDIA)    Difficulty of Paying Living Expenses: Not hard at all  Food Insecurity: No Food Insecurity (05/22/2022)   Hunger Vital Sign    Worried About Running Out of Food in the Last Year: Never true    Ran Out of Food in the Last Year: Never true  Transportation Needs: No Transportation Needs (05/22/2022)   PRAPARE - Hydrologist (Medical): No    Lack of Transportation (Non-Medical): No  Physical Activity: Inactive (05/22/2022)  Exercise Vital Sign    Days of Exercise per Week: 0 days    Minutes of Exercise per Session: 0 min  Stress: No Stress Concern Present (05/22/2022)   Drain    Feeling of Stress : Not at all  Social Connections: Moderately Integrated (05/22/2022)   Social Connection and Isolation Panel [NHANES]    Frequency of Communication with Friends and Family: More than three times a week    Frequency of Social Gatherings with Friends and Family: More than three times a week    Attends Religious Services: More than 4 times per year    Active Member of [employer] or Organizations: Yes    Attends Music therapist: More than 4 times per year    Marital Status: Divorced    Tobacco Counseling Counseling given: Not Answered   Clinical Intake:  Pre-visit preparation completed: No  Diabetic?  No    Activities of Daily Living    05/22/2022    9:25 AM  In your present state of health, do you have any difficulty performing the following activities:  Hearing? 0  Vision? 0  Difficulty concentrating or making decisions? 0  Walking or climbing stairs? 0  Dressing or bathing? 0  Doing errands, shopping? 0  Preparing Food and eating ? N  Using the Toilet? N  In the past six months, have you accidently leaked urine? N  Do you have problems with  loss of bowel control? N  Managing your Medications? N  Managing your Finances? N  Housekeeping or managing your Housekeeping? N    Patient Care Team: Dorothyann Peng, NP as PCP - General (Family Medicine) Troy Sine, MD as PCP - Cardiology (Cardiology)  Indicate any recent Medical Services you may have received from other than [facility] providers in the past year (date may be approximate).     Assessment:   This is a routine wellness examination for [facility].  Hearing/Vision screen Hearing Screening - Comments:: No hearing difficulty Vision Screening - Comments:: Wears reading glasses. Followed by [facility]  Dietary issues and exercise activities discussed: Exercise limited by: None identified   Goals Addressed               This Visit's Progress     Lose weight (pt-stated)         Depression Screen    05/22/2022    9:23 AM 05/12/2022    9:59 AM 04/27/2021    8:55 AM 11/04/2019    9:25 AM 01/26/2017   12:21 PM 09/25/2016    8:26 AM  PHQ 2/9 Scores  PHQ - 2 Score 0 0 0 0 0 2    Fall Risk    05/22/2022    9:27 AM 05/12/2022    9:57 AM 04/27/2021    8:57 AM  Fall Risk   Falls in the past year? 0 0 1  Number falls in past yr: 0 0 1  Injury with Fall? 0 0 1  Comment   knee  Risk for fall due to : No Fall Risks No Fall Risks Impaired vision  Follow up  Falls evaluation completed Falls prevention discussed    FALL RISK PREVENTION PERTAINING TO THE HOME:  Any stairs in or around the home? Yes  If so, are there any without handrails? No  Home free of loose throw rugs in walkways, pet beds, electrical cords, etc? Yes  Adequate lighting in your home to reduce risk  of falls? Yes   ASSISTIVE DEVICES UTILIZED TO PREVENT FALLS:  Life alert? No  Use of a cane, walker or w/c? No  Grab bars in the bathroom? Yes  Shower chair or bench in shower? Yes  Elevated toilet seat or a handicapped toilet? Yes   TIMED UP AND GO:  Was the test performed? No . Audio  Visit  Cognitive Function:        05/22/2022    9:28 AM 04/27/2021    8:58 AM  6CIT Screen  What Year? 0 points 0 points  What month? 0 points 0 points  What time? 0 points 0 points  Count back from 20 0 points 0 points  Months in reverse 0 points 0 points  Repeat phrase 0 points 0 points  Total Score 0 points 0 points    Immunizations Immunization History  Administered Date(s) Administered   Fluad Quad(high Dose 65+) 11/04/2019, 08/04/2021   Influenza Split 06/30/2018   Influenza Whole 09/16/2010   Influenza,inj,Quad PF,6+ Mos 10/09/2013, 08/14/2017   Influenza-Unspecified 07/31/2015, 11/24/2019   Moderna Covid-19 Vaccine Bivalent Booster 65yr & up 12/02/2021   PFIZER(Purple Top)SARS-COV-2 Vaccination 12/04/2019, 12/25/2019, 09/07/2020   PNEUMOCOCCAL CONJUGATE-20 08/04/2021   Pneumococcal Conjugate-13 11/04/2019   Tdap 03/24/2014    TDAP status: Up to date  Flu Vaccine status: Up to date  Pneumococcal vaccine status: Up to date  Covid-19 vaccine status: Completed vaccines  Qualifies for Shingles Vaccine? Yes   Zostavax completed Yes   Shingrix Completed?: Yes  Screening Tests Health Maintenance  Topic Date Due   Zoster Vaccines- Shingrix (1 of 2) Never done   INFLUENZA VACCINE  05/30/2022   COLONOSCOPY (Pts 45-474yrInsurance coverage will need to be confirmed)  12/29/2022   TETANUS/TDAP  03/24/2024   Pneumonia Vaccine 6571Years old  Completed   COVID-19 Vaccine  Completed   Hepatitis C Screening  Completed   HPV VACCINES  Aged Out    Health Maintenance  Health Maintenance Due  Topic Date Due   Zoster Vaccines- Shingrix (1 of 2) Never done    Colorectal cancer screening: Type of screening: Colonoscopy. Completed 12/28/20. Repeat every 3 years  Lung Cancer Screening: (Low Dose CT Chest recommended if Age 68-80ears, 30 pack-year currently smoking OR have quit w/in 15years.) does not qualify.     Additional Screening:  Hepatitis C Screening: does  qualify; Completed 05/21/18  Vision Screening: Recommended annual ophthalmology exams for early detection of glaucoma and other disorders of the eye. Is the patient up to date with their annual eye exam?  Yes  Who is the provider or what is the name of the office in which the patient attends annual eye exams? [facility] If pt is not established with a provider, would they like to be referred to a provider to establish care? No .   Dental Screening: Recommended annual dental exams for proper oral hygiene  Community Resource Referral / Chronic Care Management:   CRR required this visit?  No   CCM required this visit?  No      Plan:     I have personally reviewed and noted the following in the patient's chart:   Medical and social history Use of alcohol, tobacco or illicit drugs  Current medications and supplements including opioid prescriptions. Patient is not currently taking opioid prescriptions. Functional ability and status Nutritional status Physical activity Advanced directives List of other physicians Hospitalizations, surgeries, and ER visits in previous 12 months Vitals Screenings to include  cognitive, depression, and falls Referrals and appointments  In addition, I have reviewed and discussed with patient certain preventive protocols, quality metrics, and best practice recommendations. A written personalized care plan for preventive services as well as general preventive health recommendations were provided to patient.     Criselda Peaches, LPN   04/22/7627   Nurse Notes: None

## 2022-05-22 NOTE — Patient Instructions (Signed)
[name] , Thank you for taking time to come for your Medicare Wellness Visit. I appreciate your ongoing commitment to your health goals. Please review the following plan we discussed and let me know if I can assist you in the future.   These are the goals we discussed:  Goals       Lose weight (pt-stated)      Patient Stated      Get knee back to good health        This is a list of the screening recommended for you and due dates:  Health Maintenance  Topic Date Due   Zoster (Shingles) Vaccine (1 of 2) Never done   Flu Shot  05/30/2022   Colon Cancer Screening  12/29/2022   Tetanus Vaccine  03/24/2024   Pneumonia Vaccine  Completed   COVID-19 Vaccine  Completed   Hepatitis C Screening: USPSTF Recommendation to screen - Ages 18-79 yo.  Completed   HPV Vaccine  Aged Out   Advanced directives: No  Conditions/risks identified: None  Next appointment: Follow up in one year for your annual wellness visit.   Preventive Care 68 Years and Older, Male Preventive care refers to lifestyle choices and visits with your health care provider that can promote health and wellness. What does preventive care include? A yearly physical exam. This is also called an annual well check. Dental exams once or twice a year. Routine eye exams. Ask your health care provider how often you should have your eyes checked. Personal lifestyle choices, including: Daily care of your teeth and gums. Regular physical activity. Eating a healthy diet. Avoiding tobacco and drug use. Limiting alcohol use. Practicing safe sex. Taking low doses of aspirin every day. Taking vitamin and mineral supplements as recommended by your health care provider. What happens during an annual well check? The services and screenings done by your health care provider during your annual well check will depend on your age, overall health, lifestyle risk factors, and family history of disease. Counseling  Your health care  provider may ask you questions about your: Alcohol use. Tobacco use. Drug use. Emotional well-being. Home and relationship well-being. Sexual activity. Eating habits. History of falls. Memory and ability to understand (cognition). Work and work Statistician. Screening  You may have the following tests or measurements: Height, weight, and BMI. Blood pressure. Lipid and cholesterol levels. These may be checked every 5 years, or more frequently if you are over 48 years old. Skin check. Lung cancer screening. You may have this screening every year starting at age 31 if you have a 30-pack-year history of smoking and currently smoke or have quit within the past 15 years. Fecal occult blood test (FOBT) of the stool. You may have this test every year starting at age 48. Flexible sigmoidoscopy or colonoscopy. You may have a sigmoidoscopy every 5 years or a colonoscopy every 10 years starting at age 63. Prostate cancer screening. Recommendations will vary depending on your family history and other risks. Hepatitis C blood test. Hepatitis B blood test. Sexually transmitted disease (STD) testing. Diabetes screening. This is done by checking your blood sugar (glucose) after you have not eaten for a while (fasting). You may have this done every 1-3 years. Abdominal aortic aneurysm (AAA) screening. You may need this if you are a current or former smoker. Osteoporosis. You may be screened starting at age 32 if you are at high risk. Talk with your health care provider about your test results, treatment options, and  if necessary, the need for more tests. Vaccines  Your health care provider may recommend certain vaccines, such as: Influenza vaccine. This is recommended every year. Tetanus, diphtheria, and acellular pertussis (Tdap, Td) vaccine. You may need a Td booster every 10 years. Zoster vaccine. You may need this after age 26. Pneumococcal 13-valent conjugate (PCV13) vaccine. One dose is  recommended after age 90. Pneumococcal polysaccharide (PPSV23) vaccine. One dose is recommended after age 54. Talk to your health care provider about which screenings and vaccines you need and how often you need them. This information is not intended to replace advice given to you by your health care provider. Make sure you discuss any questions you have with your health care provider. Document Released: 11/12/2015 Document Revised: 07/05/2016 Document Reviewed: 08/17/2015 Elsevier Interactive Patient Education  2017 [place] Prevention in the Home Falls can cause injuries. They can happen to people of all ages. There are many things you can do to make your home safe and to help prevent falls. What can I do on the outside of my home? Regularly fix the edges of walkways and driveways and fix any cracks. Remove anything that might make you trip as you walk through a door, such as a raised step or threshold. Trim any bushes or trees on the path to your home. Use bright outdoor lighting. Clear any walking paths of anything that might make someone trip, such as rocks or tools. Regularly check to see if handrails are loose or broken. Make sure that both sides of any steps have handrails. Any raised decks and porches should have guardrails on the edges. Have any leaves, snow, or ice cleared regularly. Use sand or salt on walking paths during winter. Clean up any spills in your garage right away. This includes oil or grease spills. What can I do in the bathroom? Use night lights. Install grab bars by the toilet and in the tub and shower. Do not use towel bars as grab bars. Use non-skid mats or decals in the tub or shower. If you need to sit down in the shower, use a plastic, non-slip stool. Keep the floor dry. Clean up any water that spills on the floor as soon as it happens. Remove soap buildup in the tub or shower regularly. Attach bath mats securely with double-sided non-slip rug  tape. Do not have throw rugs and other things on the floor that can make you trip. What can I do in the bedroom? Use night lights. Make sure that you have a light by your bed that is easy to reach. Do not use any sheets or blankets that are too big for your bed. They should not hang down onto the floor. Have a firm chair that has side arms. You can use this for support while you get dressed. Do not have throw rugs and other things on the floor that can make you trip. What can I do in the kitchen? Clean up any spills right away. Avoid walking on wet floors. Keep items that you use a lot in easy-to-reach places. If you need to reach something above you, use a strong step stool that has a grab bar. Keep electrical cords out of the way. Do not use floor polish or wax that makes floors slippery. If you must use wax, use non-skid floor wax. Do not have throw rugs and other things on the floor that can make you trip. What can I do with my stairs? Do not leave any  items on the stairs. Make sure that there are handrails on both sides of the stairs and use them. Fix handrails that are broken or loose. Make sure that handrails are as long as the stairways. Check any carpeting to make sure that it is firmly attached to the stairs. Fix any carpet that is loose or worn. Avoid having throw rugs at the top or bottom of the stairs. If you do have throw rugs, attach them to the floor with carpet tape. Make sure that you have a light switch at the top of the stairs and the bottom of the stairs. If you do not have them, ask someone to add them for you. What else can I do to help prevent falls? Wear shoes that: Do not have high heels. Have rubber bottoms. Are comfortable and fit you well. Are closed at the toe. Do not wear sandals. If you use a stepladder: Make sure that it is fully opened. Do not climb a closed stepladder. Make sure that both sides of the stepladder are locked into place. Ask someone to  hold it for you, if possible. Clearly mark and make sure that you can see: Any grab bars or handrails. First and last steps. Where the edge of each step is. Use tools that help you move around (mobility aids) if they are needed. These include: Canes. Walkers. Scooters. Crutches. Turn on the lights when you go into a dark area. Replace any light bulbs as soon as they burn out. Set up your furniture so you have a clear path. Avoid moving your furniture around. If any of your floors are uneven, fix them. If there are any pets around you, be aware of where they are. Review your medicines with your doctor. Some medicines can make you feel dizzy. This can increase your chance of falling. Ask your doctor what other things that you can do to help prevent falls. This information is not intended to replace advice given to you by your health care provider. Make sure you discuss any questions you have with your health care provider. Document Released: 08/12/2009 Document Revised: 03/23/2016 Document Reviewed: 11/20/2014 Elsevier Interactive Patient Education  2017 [employer].

## 2022-08-30 ENCOUNTER — Encounter (INDEPENDENT_AMBULATORY_CARE_PROVIDER_SITE_OTHER): Payer: BLUE CROSS/BLUE SHIELD | Admitting: Family Medicine

## 2022-08-30 DIAGNOSIS — Z0289 Encounter for other administrative examinations: Secondary | ICD-10-CM

## 2022-09-13 ENCOUNTER — Telehealth: Payer: Self-pay | Admitting: Adult Health

## 2022-09-13 NOTE — Telephone Encounter (Signed)
disregard

## 2022-09-15 ENCOUNTER — Ambulatory Visit (INDEPENDENT_AMBULATORY_CARE_PROVIDER_SITE_OTHER): Payer: Medicare Other | Admitting: Adult Health

## 2022-09-15 DIAGNOSIS — R351 Nocturia: Secondary | ICD-10-CM

## 2022-09-15 DIAGNOSIS — N401 Enlarged prostate with lower urinary tract symptoms: Secondary | ICD-10-CM | POA: Diagnosis not present

## 2022-09-15 LAB — URINALYSIS
Bilirubin Urine: NEGATIVE
Hgb urine dipstick: NEGATIVE
Ketones, ur: NEGATIVE
Leukocytes,Ua: NEGATIVE
Nitrite: NEGATIVE
Specific Gravity, Urine: 1.02 (ref 1.000–1.030)
Total Protein, Urine: NEGATIVE
Urine Glucose: NEGATIVE
Urobilinogen, UA: 0.2 (ref 0.0–1.0)
pH: 6 (ref 5.0–8.0)

## 2022-09-15 LAB — PSA: PSA: 2.6 ng/mL (ref 0.10–4.00)

## 2022-09-15 MED ORDER — TAMSULOSIN HCL 0.4 MG PO CAPS: 0.4000 mg | ORAL_CAPSULE | Freq: Every day | ORAL | 0 refills | Status: DC

## 2022-09-15 NOTE — Progress Notes (Signed)
Subjective:    Patient ID: Jacob Quinn, male    DOB: 1956-05-17, 66 y.o.   MRN: [id number]  HPI 66 year old male who  has a past medical history of 1st degree AV block, BPH associated with nocturia, CAD in native artery, Encephalitis, viral, Hyperlipidemia (08/07/2016), Hypertension, Ischemic cardiomyopathy, Myocardial infarction ([place]) (2017), OA (osteoarthritis), Obesity (BMI 30-39.9) (08/07/2016), Renal insufficiency, Restless leg syndrome (09/16/2010), Rheumatic fever, and Syncope (2009).  He presents to the office today for symptoms of BPH.  This has been an ongoing issue, has been on Flomax in the past and reports that this helped to some degree but he ran out of the medication.  He also reports that he had knee surgery about a year ago and has some urinary retention.  He was seeing a urologist through the [place] but would like to see a urologist outside of the [place].  Symptoms today are decreased stream, dribbling, and urinary frequency due to incomplete bladder emptying.  He denies dysuria, or hematuria.  Review of Systems See HPI   Past Medical History:  Diagnosis Date   1st degree AV block    BPH associated with nocturia    CAD in native artery    a. NSTEMI 07/2016 -  LHC 08/07/16 showing 70% D1, 20% mLAD, segmental 95% then 75% OM stenosis, 85% acute marg, normal LVEDP - received PTCA/DES to OM.   Encephalitis, viral    Hyperlipidemia 08/07/2016   Hypertension    Ischemic cardiomyopathy    a. LHC 07/2016 - mild mid anterolateral focal hypocontractility felt to be due to the circumflex stenosis, EF 50%.   Myocardial infarction ([place]) 2017   OA (osteoarthritis)    Obesity (BMI 30-39.9) 08/07/2016   Renal insufficiency    a. 2016 r/t dehydration/ACEI use.   Restless leg syndrome 09/16/2010   Rheumatic fever    Syncope 2009   associated with viral URI    Social History   Socioeconomic History   Marital status: Single    Spouse name: Not on file   Number of children: 3    Years of education: Not on file   Highest education level: Not on file  Occupational History   Occupation: English as a second language teacher: [employer]    Comment: [name]  Tobacco Use   Smoking status: Never   Smokeless tobacco: Never  Vaping Use   Vaping Use: Never used  Substance and Sexual Activity   Alcohol use: No   Drug use: No   Sexual activity: Yes  Other Topics Concern   Not on file  Social History Narrative   Lives with daughter   Is retired -    Social Determinants of [place] Strain: [place]  (05/22/2022)   Overall Financial Resource Strain (CARDIA)    Difficulty of Paying Living Expenses: Not hard at all  Food Insecurity: No Food Insecurity (05/22/2022)   Hunger Vital Sign    Worried About Running Out of Food in the Last Year: Never true    [place] in the Last Year: Never true  Transportation Needs: No Transportation Needs (05/22/2022)   PRAPARE - Hydrologist (Medical): No    Lack of Transportation (Non-Medical): No  Physical Activity: Inactive (05/22/2022)   Exercise Vital Sign    Days of Exercise per Week: 0 days    Minutes of Exercise per Session: 0 min  Stress: No Stress Concern Present (05/22/2022)  [employer] - Occupational Stress Questionnaire    Feeling of Stress : Not at all  Social Connections: Moderately Integrated (05/22/2022)   Social Connection and Isolation Panel [NHANES]    Frequency of Communication with Friends and Family: More than three times a week    Frequency of Social Gatherings with Friends and Family: More than three times a week    Attends Religious Services: More than 4 times per year    Active Member of Clubs or Organizations: Yes    Attends Archivist Meetings: More than 4 times per year    Marital Status: Divorced  Intimate Partner Violence: Not At Risk (05/22/2022)   Humiliation, Afraid, Rape, and Kick questionnaire    Fear of  Current or Ex-Partner: No    Emotionally Abused: No    Physically Abused: No    Sexually Abused: No    Past Surgical History:  Procedure Laterality Date   CARDIAC CATHETERIZATION N/A 08/07/2016   Procedure: Left Heart Cath and Coronary Angiography;  Surgeon: Troy Sine, MD;  Location: [facility];  Service: Cardiovascular;  Laterality: N/A;   CARDIAC CATHETERIZATION N/A 08/07/2016   Procedure: Coronary Stent Intervention;  Surgeon: Troy Sine, MD;  Location: [facility];  Service: Cardiovascular;  Laterality: N/A;   COLONOSCOPY  05/21/2012   COLONOSCOPY  12/29/2019   POLYPECTOMY     REPLACEMENT TOTAL KNEE     left and right-  5 total    TONSILLECTOMY     TOTAL KNEE REVISION Left 11/19/2017   Procedure: LEFT TOTAL KNEE REVISION;  Surgeon: Frederik Pear, MD;  Location: [facility];  Service: Orthopedics;  Laterality: Left;   TOTAL KNEE REVISION Right 04/22/2018   Procedure: RIGHT TOTAL KNEE REVISION;  Surgeon: Frederik Pear, MD;  Location: [facility];  Service: Orthopedics;  Laterality: Right;  block    Family History  Problem Relation Age of Onset   Hypertension Mother    Heart disease Mother    Prostate cancer Father    Dementia Father    Depression Brother    Hypertension Brother    Hypertension Brother    Stroke Brother    Colon cancer Neg Hx    Colon polyps Neg Hx    Esophageal cancer Neg Hx    Rectal cancer Neg Hx    Stomach cancer Neg Hx     Allergies  Allergen Reactions   Penicillins Anaphylaxis    *Has tolerated cephalosporins in the past*  Has patient had a PCN reaction causing immediate rash, facial/tongue/throat swelling, SOB or lightheadedness with hypotension: Yes Has patient had a PCN reaction causing severe rash involving mucus membranes or skin necrosis: No Has patient had a PCN reaction that required hospitalization: No Has patient had a PCN reaction occurring within the last 10 years: No If all of the above answers are "NO", then may proceed  with Cephalosporin use.    Lisinopril     Cramp    Tizanidine Other (See Comments)    High fevers, shaking   Latex Rash    Current Outpatient Medications on File Prior to Visit  Medication Sig Dispense Refill   amLODipine (NORVASC) 10 MG tablet Take 10 mg by mouth daily.     aspirin 81 MG tablet Take 81 mg by mouth daily.     atenolol (TENORMIN) 25 MG tablet Take 25 mg by mouth daily.     atorvastatin (LIPITOR) 80 MG tablet Take by mouth.  chlorthalidone (HYGROTON) 25 MG tablet Take 0.5 tablets by mouth every morning.     metoprolol succinate (TOPROL-XL) 25 MG 24 hr tablet TAKE 1 TABLET (25 MG TOTAL) BY MOUTH DAILY. NEED PHYSICAL EXAM FOR FURTHER REFILLS 90 tablet 0   Multiple Vitamin (MULTI VITAMIN DAILY) TABS Take 1 tablet by mouth daily.     nitroGLYCERIN (NITROSTAT) 0.4 MG SL tablet Place 1 tablet (0.4 mg total) under the tongue every 5 (five) minutes as needed for chest pain. Up to 3 doses. 25 tablet 3   celecoxib (CELEBREX) 200 MG capsule Take by mouth. (Patient not taking: Reported on 09/15/2022)     cyclobenzaprine (FLEXERIL) 10 MG tablet Take 1 tablet (10 mg total) by mouth 3 (three) times daily as needed for muscle spasms. (Patient not taking: Reported on 09/15/2022) 30 tablet 0   furosemide (LASIX) 20 MG tablet Take 1 tablet (20 mg total) by mouth daily. MAY TAKE AN ADDITIONAL DOSE FOR ADDITIONAL SWELLING 20 tablet 0   irbesartan (AVAPRO) 150 MG tablet Take 1 tablet (150 mg total) by mouth daily. (Patient not taking: Reported on 09/15/2022) 90 tablet 3   REPATHA SURECLICK 798 MG/ML SOAJ INJECT 140 MG INTO THE SKIN EVERY 14 (FOURTEEN) DAYS. (Patient not taking: Reported on 09/15/2022) 2 mL 11   No current facility-administered medications on file prior to visit.    BP 132/82 (BP Location: Left Arm, Patient Position: Sitting, Cuff Size: Large)   Pulse 70   Temp 98.6 F (37 C) (Oral)   Wt 278 lb (126.1 kg)   SpO2 96%   BMI 37.70 kg/m       Objective:   Physical  Exam Vitals and nursing note reviewed.  Constitutional:      Appearance: Normal appearance.  Skin:    General: Skin is warm and dry.     Capillary Refill: Capillary refill takes less than 2 seconds.  Neurological:     General: No focal deficit present.     Mental Status: He is alert and oriented to person, place, and time.  Psychiatric:        Mood and Affect: Mood normal.        Behavior: Behavior normal.        Thought Content: Thought content normal.        Judgment: Judgment normal.       Assessment & Plan:  1. BPH associated with nocturia  - tamsulosin (FLOMAX) 0.4 MG CAPS capsule; Take 1 capsule (0.4 mg total) by mouth daily after breakfast. TAKE 1 CAPSULE BY MOUTH DAILY. **DUE FOR YEARLY PHYSICAL**  Dispense: 90 capsule; Refill: 0 - Urinalysis; Future - PSA; Future - Ambulatory referral to Urology   Dorothyann Peng, NP

## 2022-09-15 NOTE — Patient Instructions (Addendum)
I am going to check a urinalysis on you and check your PSA   I have placed a referral for urology   Schedule a physical exam

## 2022-09-19 ENCOUNTER — Ambulatory Visit (INDEPENDENT_AMBULATORY_CARE_PROVIDER_SITE_OTHER): Payer: Medicare Other | Admitting: Family Medicine

## 2022-09-19 ENCOUNTER — Encounter (INDEPENDENT_AMBULATORY_CARE_PROVIDER_SITE_OTHER): Payer: Self-pay | Admitting: Family Medicine

## 2022-09-19 VITALS — BP 128/80 | HR 79 | Temp 98.0°F | Ht 72.0 in | Wt 276.0 lb

## 2022-09-19 DIAGNOSIS — Z6837 Body mass index (BMI) 37.0-37.9, adult: Secondary | ICD-10-CM

## 2022-09-19 DIAGNOSIS — R5383 Other fatigue: Secondary | ICD-10-CM | POA: Insufficient documentation

## 2022-09-19 DIAGNOSIS — R7303 Prediabetes: Secondary | ICD-10-CM | POA: Diagnosis not present

## 2022-09-19 DIAGNOSIS — Z1331 Encounter for screening for depression: Secondary | ICD-10-CM | POA: Diagnosis not present

## 2022-09-19 DIAGNOSIS — R0602 Shortness of breath: Secondary | ICD-10-CM

## 2022-09-19 DIAGNOSIS — E782 Mixed hyperlipidemia: Secondary | ICD-10-CM

## 2022-09-19 DIAGNOSIS — E7849 Other hyperlipidemia: Secondary | ICD-10-CM | POA: Diagnosis not present

## 2022-09-19 DIAGNOSIS — I25810 Atherosclerosis of coronary artery bypass graft(s) without angina pectoris: Secondary | ICD-10-CM | POA: Diagnosis not present

## 2022-09-19 DIAGNOSIS — I10 Essential (primary) hypertension: Secondary | ICD-10-CM | POA: Diagnosis not present

## 2022-09-19 DIAGNOSIS — E669 Obesity, unspecified: Secondary | ICD-10-CM | POA: Insufficient documentation

## 2022-09-20 LAB — CBC WITH DIFFERENTIAL/PLATELET
Basophils Absolute: 0.1 10*3/uL (ref 0.0–0.2)
Basos: 1 %
EOS (ABSOLUTE): 0.3 10*3/uL (ref 0.0–0.4)
Eos: 3 %
Hematocrit: 42.3 % (ref 37.5–51.0)
Hemoglobin: 14 g/dL (ref 13.0–17.7)
Immature Grans (Abs): 0 10*3/uL (ref 0.0–0.1)
Immature Granulocytes: 1 %
Lymphocytes Absolute: 2.3 10*3/uL (ref 0.7–3.1)
Lymphs: 30 %
MCH: 28.7 pg (ref 26.6–33.0)
MCHC: 33.1 g/dL (ref 31.5–35.7)
MCV: 87 fL (ref 79–97)
Monocytes Absolute: 0.6 10*3/uL (ref 0.1–0.9)
Monocytes: 8 %
Neutrophils Absolute: 4.4 10*3/uL (ref 1.4–7.0)
Neutrophils: 57 %
Platelets: 273 10*3/uL (ref 150–450)
RBC: 4.87 x10E6/uL (ref 4.14–5.80)
RDW: 14.1 % (ref 11.6–15.4)
WBC: 7.6 10*3/uL (ref 3.4–10.8)

## 2022-09-20 LAB — LIPID PANEL WITH LDL/HDL RATIO
Cholesterol, Total: 150 mg/dL (ref 100–199)
HDL: 34 mg/dL — ABNORMAL LOW (ref >39)
LDL Chol Calc (NIH): 78 mg/dL (ref 0–99)
LDL/HDL Ratio: 2.3 ratio (ref 0.0–3.6)
Triglycerides: 230 mg/dL — ABNORMAL HIGH (ref 0–149)
VLDL Cholesterol Cal: 38 mg/dL (ref 5–40)

## 2022-09-20 LAB — COMPREHENSIVE METABOLIC PANEL
ALT: 24 IU/L (ref 0–44)
AST: 25 IU/L (ref 0–40)
Albumin/Globulin Ratio: 1.4 (ref 1.2–2.2)
Albumin: 4.3 g/dL (ref 3.9–4.9)
Alkaline Phosphatase: 98 IU/L (ref 44–121)
BUN/Creatinine Ratio: 11 (ref 10–24)
BUN: 13 mg/dL (ref 8–27)
Bilirubin Total: 0.7 mg/dL (ref 0.0–1.2)
CO2: 25 mmol/L (ref 20–29)
Calcium: 9.7 mg/dL (ref 8.6–10.2)
Chloride: 101 mmol/L (ref 96–106)
Creatinine, Ser: 1.2 mg/dL (ref 0.76–1.27)
Globulin, Total: 3.1 g/dL (ref 1.5–4.5)
Glucose: 105 mg/dL — ABNORMAL HIGH (ref 70–99)
Potassium: 4.4 mmol/L (ref 3.5–5.2)
Sodium: 141 mmol/L (ref 134–144)
Total Protein: 7.4 g/dL (ref 6.0–8.5)
eGFR: 66 mL/min/{1.73_m2} (ref >59)

## 2022-09-20 LAB — T4, FREE: Free T4: 1.03 ng/dL (ref 0.82–1.77)

## 2022-09-20 LAB — VITAMIN B12: Vitamin B-12: 511 pg/mL (ref 232–1245)

## 2022-09-20 LAB — HEMOGLOBIN A1C
Est. average glucose Bld gHb Est-mCnc: 128 mg/dL
Hgb A1c MFr Bld: 6.1 % — ABNORMAL HIGH (ref 4.8–5.6)

## 2022-09-20 LAB — TSH: TSH: 1.79 u[IU]/mL (ref 0.450–4.500)

## 2022-09-20 LAB — FOLATE: Folate: 15 ng/mL (ref >3.0)

## 2022-09-20 LAB — VITAMIN D 25 HYDROXY (VIT D DEFICIENCY, FRACTURES): Vit D, 25-Hydroxy: 23.4 ng/mL — ABNORMAL LOW (ref 30.0–100.0)

## 2022-09-20 LAB — INSULIN, RANDOM: INSULIN: 26.5 u[IU]/mL — ABNORMAL HIGH (ref 2.6–24.9)

## 2022-09-26 ENCOUNTER — Telehealth: Payer: Self-pay | Admitting: Adult Health

## 2022-09-26 NOTE — Telephone Encounter (Signed)
Okay for refill?  

## 2022-09-26 NOTE — Telephone Encounter (Signed)
Patient called because he is completely out of his amLODipine (NORVASC) 10 MG tablet   [facility]   [id number] [place]: [phone] Fax: [phone]      CTommi Rumpswill need to become the prescribing provider   Patient was made aware that refills/new prescriptions can take up to 72 hours for completion.     Please advise

## 2022-09-27 MED ORDER — AMLODIPINE BESYLATE 10 MG PO TABS: 10.0000 mg | ORAL_TABLET | Freq: Every day | ORAL | 1 refills | Status: DC

## 2022-09-27 NOTE — Telephone Encounter (Signed)
Rx filled sent to [facility].

## 2022-10-03 ENCOUNTER — Ambulatory Visit (INDEPENDENT_AMBULATORY_CARE_PROVIDER_SITE_OTHER): Payer: Medicare Other | Admitting: Family Medicine

## 2022-10-03 ENCOUNTER — Encounter (INDEPENDENT_AMBULATORY_CARE_PROVIDER_SITE_OTHER): Payer: Self-pay | Admitting: Family Medicine

## 2022-10-03 VITALS — BP 136/90 | HR 57 | Temp 98.0°F | Ht 72.0 in | Wt 266.0 lb

## 2022-10-03 DIAGNOSIS — I252 Old myocardial infarction: Secondary | ICD-10-CM

## 2022-10-03 DIAGNOSIS — Z6836 Body mass index (BMI) 36.0-36.9, adult: Secondary | ICD-10-CM

## 2022-10-03 DIAGNOSIS — E669 Obesity, unspecified: Secondary | ICD-10-CM

## 2022-10-03 DIAGNOSIS — R7303 Prediabetes: Secondary | ICD-10-CM

## 2022-10-03 DIAGNOSIS — E7849 Other hyperlipidemia: Secondary | ICD-10-CM | POA: Insufficient documentation

## 2022-10-03 DIAGNOSIS — E559 Vitamin D deficiency, unspecified: Secondary | ICD-10-CM

## 2022-10-03 DIAGNOSIS — I10 Essential (primary) hypertension: Secondary | ICD-10-CM

## 2022-10-03 MED ORDER — VITAMIN D (ERGOCALCIFEROL) 1.25 MG (50000 UNIT) PO CAPS: 50000.0000 [IU] | ORAL_CAPSULE | ORAL | 0 refills | Status: DC

## 2022-10-04 ENCOUNTER — Ambulatory Visit: Payer: Medicare Other | Admitting: Urology

## 2022-10-04 NOTE — Progress Notes (Deleted)
10/04/2022 7:36 AM   Jacob Quinn [birth year] [id number]  Referring provider: Dorothyann Peng, NP [place],  [place] [address]  No chief complaint on file.   HPI: Jacob Quinn is a 66 y.o. male referred for evaluation of a BPH.  Has been followed by urology at the [facility] and on tamsulosin.  Requested referral outside of the [place] system with his PCP at an office visit last month. 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  Initial residuals were moderate at ~ 250 mL however a follow-up visit October 2022 with a PVR of 20 mL   PMH: Past Medical History:  Diagnosis Date   1st degree AV block    Back pain    BPH associated with nocturia    CAD in native artery    a. NSTEMI 07/2016 -  LHC 08/07/16 showing 70% D1, 20% mLAD, segmental 95% then 75% OM stenosis, 85% acute marg, normal LVEDP - received PTCA/DES to OM.   Encephalitis, viral    GERD (gastroesophageal reflux disease)    Hyperlipidemia 08/07/2016   Hypertension    Ischemic cardiomyopathy    a. LHC 07/2016 - mild mid anterolateral focal hypocontractility felt to be due to the circumflex stenosis, EF 50%.   Joint pain    Myocardial infarction ([place]) 2017   OA (osteoarthritis)    Obesity (BMI 30-39.9) 08/07/2016   Renal insufficiency    a. 2016 r/t dehydration/ACEI use.   Restless leg syndrome 09/16/2010   Rheumatic fever    Syncope 2009   associated with viral URI    Surgical History: Past Surgical History:  Procedure Laterality Date   CARDIAC CATHETERIZATION N/A 08/07/2016   Procedure: Left Heart Cath and Coronary Angiography;  Surgeon: Troy Sine, MD;  Location: [place] [facility];  Service: Cardiovascular;  Laterality: N/A;   CARDIAC CATHETERIZATION N/A 08/07/2016   Procedure: Coronary Stent Intervention;  Surgeon: Troy Sine, MD;  Location: [place] [facility];  Service: Cardiovascular;   Laterality: N/A;   COLONOSCOPY  05/21/2012   COLONOSCOPY  12/29/2019   POLYPECTOMY     REPLACEMENT TOTAL KNEE     left and right-  5 total    TONSILLECTOMY     TOTAL KNEE REVISION Left 11/19/2017   Procedure: LEFT TOTAL KNEE REVISION;  Surgeon: Frederik Pear, MD;  Location: [place];  Service: Orthopedics;  Laterality: Left;   TOTAL KNEE REVISION Right 04/22/2018   Procedure: RIGHT TOTAL KNEE REVISION;  Surgeon: Frederik Pear, MD;  Location: [facility];  Service: Orthopedics;  Laterality: Right;  block    Home Medications:  Allergies as of 10/04/2022       Reactions   Penicillins Anaphylaxis   *Has tolerated cephalosporins in the past* Has patient had a PCN reaction causing immediate rash, facial/tongue/throat swelling, SOB or lightheadedness with hypotension: Yes Has patient had a PCN reaction causing severe rash involving mucus membranes or skin necrosis: No Has patient had a PCN reaction that required hospitalization: No Has patient had a PCN reaction occurring within the last 10 years: No If all of the above answers are "NO", then may proceed with Cephalosporin use.   Lisinopril    Cramp   Tizanidine Other (See Comments)   High fevers, shaking   Latex Rash        Medication List        Accurate as of October 04, 2022  7:36 AM.  If you have any questions, ask your nurse or doctor.          amLODipine 10 MG tablet Commonly known as: NORVASC Take 1 tablet (10 mg total) by mouth daily.   aspirin 81 MG tablet Take 81 mg by mouth daily.   atenolol 25 MG tablet Commonly known as: TENORMIN Take 25 mg by mouth daily.   atorvastatin 80 MG tablet Commonly known as: LIPITOR Take by mouth.   furosemide 20 MG tablet Commonly known as: LASIX Take 1 tablet (20 mg total) by mouth daily. MAY TAKE AN ADDITIONAL DOSE FOR ADDITIONAL SWELLING   Multi Vitamin Daily Tabs Take 1 tablet by mouth daily.   nitroGLYCERIN 0.4 MG SL tablet Commonly known as: NITROSTAT Place 1 tablet (0.4  mg total) under the tongue every 5 (five) minutes as needed for chest pain. Up to 3 doses.   tamsulosin 0.4 MG Caps capsule Commonly known as: FLOMAX Take 1 capsule (0.4 mg total) by mouth daily after breakfast. TAKE 1 CAPSULE BY MOUTH DAILY. **DUE FOR YEARLY PHYSICAL**   Vitamin D (Ergocalciferol) 1.25 MG (50000 UNIT) Caps capsule Commonly known as: DRISDOL Take 1 capsule (50,000 Units total) by mouth every 7 (seven) days.        Allergies:  Allergies  Allergen Reactions   Penicillins Anaphylaxis    *Has tolerated cephalosporins in the past*  Has patient had a PCN reaction causing immediate rash, facial/tongue/throat swelling, SOB or lightheadedness with hypotension: Yes Has patient had a PCN reaction causing severe rash involving mucus membranes or skin necrosis: No Has patient had a PCN reaction that required hospitalization: No Has patient had a PCN reaction occurring within the last 10 years: No If all of the above answers are "NO", then may proceed with Cephalosporin use.    Lisinopril     Cramp    Tizanidine Other (See Comments)    High fevers, shaking   Latex Rash    Family History: Family History  Problem Relation Age of Onset   Hypertension Mother    Heart disease Mother    Obesity Mother    Prostate cancer Father    Dementia Father    Depression Brother    Hypertension Brother    Hypertension Brother    Stroke Brother    Colon cancer Neg Hx    Colon polyps Neg Hx    Esophageal cancer Neg Hx    Rectal cancer Neg Hx    Stomach cancer Neg Hx     Social History:  reports that he has never smoked. He has never used smokeless tobacco. He reports that he does not drink alcohol and does not use drugs.   Physical Exam: There were no vitals taken for this visit.  Constitutional:  Alert and oriented, No acute distress. HEENT:  AT, moist mucus membranes.  Trachea midline, no masses. Cardiovascular: No clubbing, cyanosis, or edema. Respiratory: Normal  respiratory effort, no increased work of breathing. GI: Abdomen is soft, nontender, nondistended, no abdominal masses GU: No CVA tenderness Skin: No rashes, bruises or suspicious lesions. Neurologic: Grossly intact, no focal deficits, moving all 4 extremities. Psychiatric: Normal mood and affect.  Laboratory Data: Lab Results  Component Value Date   WBC 7.6 09/19/2022   HGB 14.0 09/19/2022   HCT 42.3 09/19/2022   MCV 87 09/19/2022   PLT 273 09/19/2022    Lab Results  Component Value Date   CREATININE 1.20 09/19/2022    Lab Results  Component Value Date  PSA 2.60 09/15/2022   PSA 3.20 11/11/2019   PSA 2.72 05/21/2018    No results found for: "TESTOSTERONE"  Lab Results  Component Value Date   HGBA1C 6.1 (H) 09/19/2022    Urinalysis    Component Value Date/Time   COLORURINE YELLOW 09/15/2022 [place] 09/15/2022 1034   LABSPEC 1.020 09/15/2022 1034   PHURINE 6.0 09/15/2022 1034   GLUCOSEU NEGATIVE 09/15/2022 1034   HGBUR NEGATIVE 09/15/2022 1034   HGBUR negative 07/09/2009 0853   BILIRUBINUR NEGATIVE 09/15/2022 1034   BILIRUBINUR n 11/30/2017 1521   KETONESUR NEGATIVE 09/15/2022 1034   PROTEINUR NEGATIVE 04/11/2018 1117   UROBILINOGEN 0.2 09/15/2022 1034   NITRITE NEGATIVE 09/15/2022 1034   LEUKOCYTESUR NEGATIVE 09/15/2022 1034    Lab Results  Component Value Date   BACTERIA MANY (A) 11/22/2017    Pertinent Imaging: *** No results found for this or any previous visit.  No results found for this or any previous visit.  No results found for this or any previous visit.  No results found for this or any previous visit.  No results found for this or any previous visit.  No valid procedures specified. No results found for this or any previous visit.  No results found for this or any previous visit.   Assessment & Plan:    There are no diagnoses linked to this encounter.  No follow-ups on file.  Abbie Sons,  [facility] [address]   10/04/2022 7:36 AM   Jacob Quinn [birth year] [id number]  Referring provider: Dorothyann Peng, NP [place],  [place] [address]  No chief complaint on file.   HPI: ARIUS HARNOIS is a 66 y.o. male referred for evaluation of a BPH.  Has been followed by urology at the [facility] and on tamsulosin.  Requested referral outside of the [place] system with his PCP at an office visit last month. 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  Initial residuals were moderate at ~ 250 mL however a follow-up visit October 2022 with a PVR of 20 mL   PMH: Past Medical History:  Diagnosis Date   1st degree AV block    Back pain    BPH associated with nocturia    CAD in native artery    a. NSTEMI 07/2016 -  LHC 08/07/16 showing 70% D1, 20% mLAD, segmental 95% then 75% OM stenosis, 85% acute marg, normal LVEDP - received PTCA/DES to OM.   Encephalitis, viral    GERD (gastroesophageal reflux disease)    Hyperlipidemia 08/07/2016   Hypertension    Ischemic cardiomyopathy    a. LHC 07/2016 - mild mid anterolateral focal hypocontractility felt to be due to the circumflex stenosis, EF 50%.   Joint pain    Myocardial infarction ([place]) 2017   OA (osteoarthritis)    Obesity (BMI 30-39.9) 08/07/2016   Renal insufficiency    a. 2016 r/t dehydration/ACEI use.   Restless leg syndrome 09/16/2010   Rheumatic fever    Syncope 2009   associated with viral URI    Surgical History: Past Surgical History:  Procedure Laterality Date   CARDIAC CATHETERIZATION N/A 08/07/2016   Procedure: Left Heart Cath and Coronary Angiography;  Surgeon: Troy Sine, MD;  Location: [place] [facility];  Service: Cardiovascular;  Laterality: N/A;   CARDIAC CATHETERIZATION N/A 08/07/2016   Procedure: Coronary Stent Intervention;  Surgeon:  Troy Sine,  MD;  Location: [place] [facility];  Service: Cardiovascular;  Laterality: N/A;   COLONOSCOPY  05/21/2012   COLONOSCOPY  12/29/2019   POLYPECTOMY     REPLACEMENT TOTAL KNEE     left and right-  5 total    TONSILLECTOMY     TOTAL KNEE REVISION Left 11/19/2017   Procedure: LEFT TOTAL KNEE REVISION;  Surgeon: Frederik Pear, MD;  Location: [place];  Service: Orthopedics;  Laterality: Left;   TOTAL KNEE REVISION Right 04/22/2018   Procedure: RIGHT TOTAL KNEE REVISION;  Surgeon: Frederik Pear, MD;  Location: [facility];  Service: Orthopedics;  Laterality: Right;  block    Home Medications:  Allergies as of 10/04/2022       Reactions   Penicillins Anaphylaxis   *Has tolerated cephalosporins in the past* Has patient had a PCN reaction causing immediate rash, facial/tongue/throat swelling, SOB or lightheadedness with hypotension: Yes Has patient had a PCN reaction causing severe rash involving mucus membranes or skin necrosis: No Has patient had a PCN reaction that required hospitalization: No Has patient had a PCN reaction occurring within the last 10 years: No If all of the above answers are "NO", then may proceed with Cephalosporin use.   Lisinopril    Cramp   Tizanidine Other (See Comments)   High fevers, shaking   Latex Rash        Medication List        Accurate as of October 04, 2022  7:36 AM. If you have any questions, ask your nurse or doctor.          amLODipine 10 MG tablet Commonly known as: NORVASC Take 1 tablet (10 mg total) by mouth daily.   aspirin 81 MG tablet Take 81 mg by mouth daily.   atenolol 25 MG tablet Commonly known as: TENORMIN Take 25 mg by mouth daily.   atorvastatin 80 MG tablet Commonly known as: LIPITOR Take by mouth.   furosemide 20 MG tablet Commonly known as: LASIX Take 1 tablet (20 mg total) by mouth daily. MAY TAKE AN ADDITIONAL DOSE FOR ADDITIONAL SWELLING   Multi Vitamin Daily Tabs Take 1 tablet by mouth daily.   nitroGLYCERIN 0.4 MG  SL tablet Commonly known as: NITROSTAT Place 1 tablet (0.4 mg total) under the tongue every 5 (five) minutes as needed for chest pain. Up to 3 doses.   tamsulosin 0.4 MG Caps capsule Commonly known as: FLOMAX Take 1 capsule (0.4 mg total) by mouth daily after breakfast. TAKE 1 CAPSULE BY MOUTH DAILY. **DUE FOR YEARLY PHYSICAL**   Vitamin D (Ergocalciferol) 1.25 MG (50000 UNIT) Caps capsule Commonly known as: DRISDOL Take 1 capsule (50,000 Units total) by mouth every 7 (seven) days.        Allergies:  Allergies  Allergen Reactions   Penicillins Anaphylaxis    *Has tolerated cephalosporins in the past*  Has patient had a PCN reaction causing immediate rash, facial/tongue/throat swelling, SOB or lightheadedness with hypotension: Yes Has patient had a PCN reaction causing severe rash involving mucus membranes or skin necrosis: No Has patient had a PCN reaction that required hospitalization: No Has patient had a PCN reaction occurring within the last 10 years: No If all of the above answers are "NO", then may proceed with Cephalosporin use.    Lisinopril     Cramp    Tizanidine Other (See Comments)    High fevers, shaking   Latex Rash    Family History: Family History  Problem  Relation Age of Onset   Hypertension Mother    Heart disease Mother    Obesity Mother    Prostate cancer Father    Dementia Father    Depression Brother    Hypertension Brother    Hypertension Brother    Stroke Brother    Colon cancer Neg Hx    Colon polyps Neg Hx    Esophageal cancer Neg Hx    Rectal cancer Neg Hx    Stomach cancer Neg Hx     Social History:  reports that he has never smoked. He has never used smokeless tobacco. He reports that he does not drink alcohol and does not use drugs.   Physical Exam: There were no vitals taken for this visit.  Constitutional:  Alert and oriented, No acute distress. HEENT: [place] AT, moist mucus membranes.  Trachea midline, no  masses. Cardiovascular: No clubbing, cyanosis, or edema. Respiratory: Normal respiratory effort, no increased work of breathing. GI: Abdomen is soft, nontender, nondistended, no abdominal masses GU: No CVA tenderness Skin: No rashes, bruises or suspicious lesions. Neurologic: Grossly intact, no focal deficits, moving all 4 extremities. Psychiatric: Normal mood and affect.  Laboratory Data: Lab Results  Component Value Date   WBC 7.6 09/19/2022   HGB 14.0 09/19/2022   HCT 42.3 09/19/2022   MCV 87 09/19/2022   PLT 273 09/19/2022    Lab Results  Component Value Date   CREATININE 1.20 09/19/2022    Lab Results  Component Value Date   PSA 2.60 09/15/2022   PSA 3.20 11/11/2019   PSA 2.72 05/21/2018    No results found for: "TESTOSTERONE"  Lab Results  Component Value Date   HGBA1C 6.1 (H) 09/19/2022    Urinalysis    Component Value Date/Time   COLORURINE YELLOW 09/15/2022 1034   APPEARANCEUR CLEAR 09/15/2022 1034   LABSPEC 1.020 09/15/2022 1034   PHURINE 6.0 09/15/2022 1034   GLUCOSEU NEGATIVE 09/15/2022 1034   HGBUR NEGATIVE 09/15/2022 1034   HGBUR negative 07/09/2009 0853   BILIRUBINUR NEGATIVE 09/15/2022 1034   BILIRUBINUR n 11/30/2017 1521   KETONESUR NEGATIVE 09/15/2022 1034   PROTEINUR NEGATIVE 04/11/2018 1117   UROBILINOGEN 0.2 09/15/2022 1034   NITRITE NEGATIVE 09/15/2022 1034   LEUKOCYTESUR NEGATIVE 09/15/2022 1034    Lab Results  Component Value Date   BACTERIA MANY (A) 11/22/2017    Pertinent Imaging: *** No results found for this or any previous visit.  No results found for this or any previous visit.  No results found for this or any previous visit.  No results found for this or any previous visit.  No results found for this or any previous visit.  No valid procedures specified. No results found for this or any previous visit.  No results found for this or any previous visit.   Assessment & Plan:    There are no diagnoses linked  to this encounter.  No follow-ups on file.  Abbie Sons, [place] [address], [place], [place] [address] [phone] [place], [place] [address] [phone]

## 2022-10-10 ENCOUNTER — Encounter: Payer: Self-pay | Admitting: Urology

## 2022-10-16 NOTE — Progress Notes (Signed)
Chief Complaint:   OBESITY Jacob Quinn (MR# [id number]) is a 66 y.o. male who presents for evaluation and treatment of obesity and related comorbidities. Current BMI is Body mass index is 37.43 kg/m. Jacob Quinn has been struggling with his weight for many years and has been unsuccessful in either losing weight, maintaining weight loss, or reaching his healthy weight goal.  Patient lives with his 29 year old son.  He is a retired Furniture conservator/restorer and eats out fast food about 6 days/week.  I.e. cookout, [employer], [employer]'s, [employer] etc.  Reports his worst habit is soda, sweet.  He drinks 3 to 420 ounce regular soda, Mountain Dew per day.  Patient was referred here by his PCP.  He desires weight loss.  Jacob Quinn is currently in the action stage of change and ready to dedicate time achieving and maintaining a healthier weight. Jacob Quinn is interested in becoming our patient and working on intensive lifestyle modifications including (but not limited to) diet and exercise for weight loss.  Jacob Quinn's habits were reviewed today and are as follows: His family eats meals together, his desired weight loss is 56 lbs, he started gaining weight about 3 to 4 years ago, his heaviest weight ever was 376 pounds, he is a picky eater and doesn't like to eat healthier foods, he has significant food cravings issues, he snacks frequently in the evenings, he skips meals frequently, he is frequently drinking liquids with calories, he frequently makes poor food choices, he frequently eats larger portions than normal, and he struggles with emotional eating.  Depression Screen Jacob Quinn's Food and Mood (modified PHQ-9) score was 4.  Subjective:   1. Other fatigue Jacob Quinn admits to daytime somnolence and admits to waking up still tired. Patient has a history of symptoms of morning fatigue. Jacob Quinn generally gets 7 hours of sleep per night, and states that he has generally restful sleep. Snoring is present. Apneic episodes are present.  Epworth Sleepiness Score is 14.  Patient was given a sleep study referral by the [place], PCP. To check for home testing, but patient never completed it.  EKG no change from 01/2020 with cardiology heart rate 84 no acute findings.  2. SOB (shortness of breath) Jacob Quinn notes increasing shortness of breath with exercising and seems to be worsening over time with weight gain. He notes getting out of breath sooner with activity than he used to. This has not gotten worse recently. Jacob Quinn denies shortness of breath at rest or orthopnea.  3. Essential hypertension Patient is asymptomatic with no acute concerns.  Medication compliance is good.  Patient is taking atenolol and amlodipine.  4. Coronary artery disease involving coronary bypass graft, unspecified whether angina present, unspecified whether native or transplanted heart Procedure about 10 years ago.  Patient without concerns or issues since.  Last seen cardiology 12/2020.  Office visit note reviewed NSTEM in 07/2016 had DES to left circumflex artery only.  No modification of activity now.  Patient denies chest pain today.  5. Other hyperlipidemia Medications per [facility] doctor.  Takes daily Lipitor, and is tolerating well.  6. Prediabetes First diagnosed couple years ago.  No history of medication use.  Drinks a lot of sugary drinks and craves sweets.  Assessment/Plan:   Orders Placed This Encounter  Procedures   CBC with Differential/Platelet   Comprehensive metabolic panel   Folate   Hemoglobin A1c   Insulin, random   Lipid Panel With LDL/HDL Ratio   T4, free   TSH   Vitamin B12  VITAMIN D 25 Hydroxy (Vit-D Deficiency, Fractures)   EKG 12-Lead    There are no discontinued medications.   No orders of the defined types were placed in this encounter.    1. Other fatigue Jacob Quinn does feel that his weight is causing his energy to be lower than it should be. Fatigue may be related to obesity, depression or many other causes. Labs will be  ordered, and in the meanwhile, Jacob Quinn will focus on self care including making healthy food choices, increasing physical activity and focusing on stress reduction.  Patient will contact [address] doctor to discuss referral to sleep doctor for evaluation.  She has an appointment in 1 week.  EKG reviewed with patient, no change from prior.  No acute changes.  Update labs today.  - EKG 12-Lead - CBC with Differential/Platelet - Folate - T4, free - TSH - Vitamin B12 - VITAMIN D 25 Hydroxy (Vit-D Deficiency, Fractures)  2. SOB (shortness of breath) Jacob Quinn does feel that he gets out of breath more easily that he used to when he exercises. Jacob Quinn's shortness of breath appears to be obesity related and exercise induced. He has agreed to work on weight loss and gradually increase exercise to treat his exercise induced shortness of breath. Will continue to monitor closely.  3. Essential hypertension Blood pressure recheck manually 128/80, at goal.  Decrease salt intake and continue with Jacob Quinn for weight loss.  Update labs today.  - Comprehensive metabolic panel - T4, free - TSH - Vitamin B12 - VITAMIN D 25 Hydroxy (Vit-D Deficiency, Fractures)  4. Coronary artery disease involving coronary bypass graft, unspecified whether angina present, unspecified whether native or transplanted heart Follow-up with cardiology as directed or [facility].  Continue weight loss via heart healthy diet.  Update labs today.  5. Other hyperlipidemia Continue medications per PCP and or cardiology.  Update labs today.  Continue Jacob Quinn as prescribed.  - CBC with Differential/Platelet - Comprehensive metabolic panel - Lipid Panel With LDL/HDL Ratio  6. Prediabetes Continue with Jacob Quinn and weight loss.  Update labs today.  - Hemoglobin A1c - Insulin, random  7. Depression screening Jacob Quinn had a negative depression screening. Depression is commonly associated with obesity and often results in emotional eating behaviors. We will monitor  this closely and work on CBT to help improve the non-hunger eating patterns. Referral to Psychology may be required if no improvement is seen as he continues in our clinic.  8. Obesity, current BMI 37.4 Patient drinks almost 1000 cal/day and soda, his plan is to cut down all calorie free liquid intake.  Ancil is currently in the action stage of change and his goal is to continue with weight loss efforts. I recommend Jacob Quinn begin the structured treatment plan as follows:  He has agreed to the Category 3 Plan with 200 snack calories.  Exercise goals: As is.   Behavioral modification strategies: keeping healthy foods in the home and planning for success.  He was informed of the importance of frequent follow-up visits to maximize his success with intensive lifestyle modifications for his multiple health conditions. He was informed we would discuss his lab results at his next visit unless there is a critical issue that needs to be addressed sooner. Jacob Quinn agreed to keep his next visit at the agreed upon time to discuss these results.  Objective:   Blood pressure 128/80, pulse 79, temperature 98 F (36.7 C), height 6' (1.829 m), weight 276 lb (125.2 kg), SpO2 99 %. Body mass index is 37.43  kg/m.  EKG: Normal sinus rhythm, rate 84 BPM.  Indirect Calorimeter completed today shows a VO2 of 296 and a REE of 2045.  His calculated basal metabolic rate is 8251 thus his basal metabolic rate is worse than expected.  General: Cooperative, alert, well developed, in no acute distress. HEENT: Conjunctivae and lids unremarkable. Cardiovascular: Regular rhythm.  Lungs: Normal work of breathing. Neurologic: No focal deficits.   Lab Results  Component Value Date   CREATININE 1.20 09/19/2022   BUN 13 09/19/2022   NA 141 09/19/2022   K 4.4 09/19/2022   CL 101 09/19/2022   CO2 25 09/19/2022   Lab Results  Component Value Date   ALT 24 09/19/2022   AST 25 09/19/2022   ALKPHOS 98 09/19/2022    BILITOT 0.7 09/19/2022   Lab Results  Component Value Date   HGBA1C 6.1 (H) 09/19/2022   HGBA1C 5.7 (H) 04/22/2018   HGBA1C 5.6 01/30/2015   Lab Results  Component Value Date   INSULIN 26.5 (H) 09/19/2022   Lab Results  Component Value Date   TSH 1.790 09/19/2022   Lab Results  Component Value Date   CHOL 150 09/19/2022   HDL 34 (L) 09/19/2022   LDLCALC 78 09/19/2022   LDLDIRECT 110.4 02/19/2007   TRIG 230 (H) 09/19/2022   CHOLHDL 4 11/11/2019   Lab Results  Component Value Date   WBC 7.6 09/19/2022   HGB 14.0 09/19/2022   HCT 42.3 09/19/2022   MCV 87 09/19/2022   PLT 273 09/19/2022   No results found for: "IRON", "TIBC", "FERRITIN"  Attestation Statements:   Reviewed by clinician on day of visit: allergies, medications, problem list, medical history, surgical history, family history, social history, and previous encounter notes.  I, Davy Pique, RMA, am acting as Location manager for [employer], DO.   I have reviewed the above documentation for accuracy and completeness, and I agree with the above. Marjory Sneddon, D.O.  The [place] was signed into law in 2016 which includes the topic of electronic health records.  This provides immediate access to information in MyChart.  This includes consultation notes, operative notes, office notes, lab results and pathology reports.  If you have any questions about what you read please let us know at your next visit so we can discuss your concerns and take corrective action if need be.  We are right here with you.

## 2022-10-16 NOTE — Progress Notes (Signed)
Chief Complaint:   OBESITY Jacob Quinn Quinn here to discuss his progress with his obesity treatment plan along with follow-up of his obesity related diagnoses. Jacob Quinn Quinn on the Category 3 Plan and states he Quinn following his eating plan approximately 10% of the time. Jacob Quinn states he Quinn walking 15-20 minutes 7 times per week.  Today's visit was #: 2 Starting weight: 69 LBS Starting date: 09/19/2022 Today's weight: 266 LBS Today's date: 10/03/2022 Total lbs lost to date: 10 LBS Total lbs lost since last in-office visit: 10 LBS  Interim History: Jacob Quinn Quinn here today for his first follow-up office visit since starting the program with [place].  All blood work/ lab tests that were recently ordered by myself or an outside provider were reviewed with patient today per their request.   Extended time was spent counseling him on all new disease processes that were discovered or preexisting ones that are affected by BMI.  he understands that many of these abnormalities will need to monitored regularly along with the current treatment plan of prudent dietary changes, in which we are making each and every office visit, to improve these health parameters.  Patient Quinn focused on cutting liquid calories, no soda or juice.  He did not really follow a meal plan.  He decreased bread intake and avoided butter as well.   Subjective:   1. Prediabetes Worsening.  Labs discussed with patient today.  On 05/20/2022, A1c with [facility] was 5.7, now worse at 6.1.  Fasting insulin was 26.5.  2. H/o NSTEMI back in 2017 Discussed labs with patient today.  Patient with 1 stent placed left circumflex artery in 07/2016 with [facility].  Notes reviewed.  Patient Quinn asymptomatic without exercise restrictions.   3. Essential hypertension Stable.  Discussed labs with patient today. Patient Quinn taking lasix, Norvasc, and atenolol. Blood pressure 136/90 today.  He Quinn asymptomatic and taking medications.  Serum creatinine Quinn WNL's, but  high Quinn normal at 1.2.    4. Other hyperlipidemia Worsening.  Discussed labs with patient today. Low HDL 34 and worsening triglycerides.  Patient with prior high fructose diet.   5. Vitamin D deficiency New diagnosis.  Discussed labs with patient today. He Quinn currently taking prescription vitamin D 50,000 IU each week. He denies nausea, vomiting or muscle weakness.  Vitamin D not at goal  at 23.4.  Assessment/Plan:  No orders of the defined types were placed in this encounter.   Medications Discontinued During This Encounter  Medication Reason   celecoxib (CELEBREX) 200 MG capsule Completed Course   chlorthalidone (HYGROTON) 25 MG tablet Completed Course   cyclobenzaprine (FLEXERIL) 10 MG tablet Completed Course   irbesartan (AVAPRO) 150 MG tablet Completed Course   metoprolol succinate (TOPROL-XL) 25 MG 24 hr tablet Completed Course   REPATHA SURECLICK 867 MG/ML SOAJ Completed Course     Meds ordered this encounter  Medications   DISCONTD: Vitamin D, Ergocalciferol, (DRISDOL) 1.25 MG (50000 UNIT) CAPS capsule    Sig: Take 1 capsule (50,000 Units total) by mouth every 7 (seven) days.    Dispense:  4 capsule    Refill:  0     1. Prediabetes The importance of the PNP and weight loss discussed with patient.  Handouts given after educating with patient.  Recheck in 3-4 months.   2. H/o NSTEMI back in 2017 Recommend follow up with his PCP at the [facility] or here for routine monitoring.  Continue PNP and weight and loss. Increased  visceral fat rating of 23, education provided about significance  of this.   3. Essential hypertension Patient Quinn essentially at goal.  Decrease salt intake, PNP, and weight loss.  Continue medications per PCP.   4. Other hyperlipidemia Education given.  Continue medications but needs to decrease saturated and trans fat and also decrease simple and fatty carbs.    5. Vitamin D deficiency - I discussed the importance of vitamin D to the patient's health and  well-being.  - I reviewed possible symptoms of low Vitamin D:  low energy, depressed mood, muscle aches, joint aches, osteoporosis etc. was reviewed with patient - low Vitamin D levels may be linked to an increased risk of cardiovascular events and even increased risk of cancers- such as colon and breast.  - ideal vitamin D levels reviewed with patient  - I recommend pt take a 50,000 IU weekly prescription vit D - see script below   - Informed patient this may be a lifelong thing, and he was encouraged to continue to take the medicine until told otherwise.    - weight loss will likely improve availability of vitamin D, thus encouraged Jacob Quinn to continue with meal plan and their weight loss efforts to further improve this condition.  Thus, we will need to monitor levels regularly (every 3-4 mo on average) to keep levels within normal limits and prevent over supplementation. - pt's questions and concerns regarding this condition addressed.  Start - Vitamin D, Ergocalciferol, (DRISDOL) 1.25 MG (50000 UNIT) CAPS capsule; Take 1 capsule (50,000 Units total) by mouth every 7 (seven) days.  Dispense: 4 capsule; Refill: 0  6. Obesity with current BMI 36.1 Crockpot meals.  Jacob Quinn all natural frozen salmon filets, etc. discussed with patient.  He loves chips with [employer] protein chips.  Jacob Quinn currently in the action stage of change. As such, his goal Quinn to continue with weight loss efforts. He has agreed to the Category 3 Plan with 100-calorie snacks only.  Exercise goals:  As Quinn.  Behavioral modification strategies: increasing lean protein intake, decreasing simple carbohydrates, and avoiding temptations.  Jacob Quinn has agreed to follow-up with our clinic in 3-4 weeks. He was informed of the importance of frequent follow-up visits to maximize his success with intensive lifestyle modifications for his multiple health conditions.   Objective:   Blood pressure (!) 136/90, pulse (!) 57, temperature 98 F  (36.7 C), height 6' (1.829 m), weight 266 lb (120.7 kg), SpO2 97 %. Body mass index Quinn 36.08 kg/m.  General: Cooperative, alert, well developed, in no acute distress. HEENT: Conjunctivae and lids unremarkable. Cardiovascular: Regular rhythm.  Lungs: Normal work of breathing. Neurologic: No focal deficits.   Lab Results  Component Value Date   CREATININE 1.20 09/19/2022   BUN 13 09/19/2022   NA 141 09/19/2022   K 4.4 09/19/2022   CL 101 09/19/2022   CO2 25 09/19/2022   Lab Results  Component Value Date   ALT 24 09/19/2022   AST 25 09/19/2022   ALKPHOS 98 09/19/2022   BILITOT 0.7 09/19/2022   Lab Results  Component Value Date   HGBA1C 6.1 (H) 09/19/2022   HGBA1C 5.7 (H) 04/22/2018   HGBA1C 5.6 01/30/2015   Lab Results  Component Value Date   INSULIN 26.5 (H) 09/19/2022   Lab Results  Component Value Date   TSH 1.790 09/19/2022   Lab Results  Component Value Date   CHOL 150 09/19/2022   HDL 34 (L) 09/19/2022   LDLCALC 78 09/19/2022  LDLDIRECT 110.4 02/19/2007   TRIG 230 (H) 09/19/2022   CHOLHDL 4 11/11/2019   Lab Results  Component Value Date   VD25OH 23.4 (L) 09/19/2022   Lab Results  Component Value Date   WBC 7.6 09/19/2022   HGB 14.0 09/19/2022   HCT 42.3 09/19/2022   MCV 87 09/19/2022   PLT 273 09/19/2022   No results found for: "IRON", "TIBC", "FERRITIN"  Attestation Statements:   Reviewed by clinician on day of visit: allergies, medications, problem list, medical history, surgical history, family history, social history, and previous encounter notes.  I, Davy Pique, RMA, am acting as Location manager for [employer], DO.   I have reviewed the above documentation for accuracy and completeness, and I agree with the above. Marjory Sneddon, D.O.  The [place] was signed into law in 2016 which includes the topic of electronic health records.  This provides immediate access to information in MyChart.  This includes  consultation notes, operative notes, office notes, lab results and pathology reports.  If you have any questions about what you read please let us know at your next visit so we can discuss your concerns and take corrective action if need be.  We are right here with you.

## 2022-10-18 ENCOUNTER — Ambulatory Visit (INDEPENDENT_AMBULATORY_CARE_PROVIDER_SITE_OTHER): Payer: Self-pay | Admitting: Family Medicine

## 2022-10-18 ENCOUNTER — Encounter (INDEPENDENT_AMBULATORY_CARE_PROVIDER_SITE_OTHER): Payer: Self-pay | Admitting: Family Medicine

## 2022-10-18 VITALS — BP 153/78 | HR 57 | Temp 97.9°F | Ht 72.0 in | Wt 260.0 lb

## 2022-10-18 DIAGNOSIS — E559 Vitamin D deficiency, unspecified: Secondary | ICD-10-CM

## 2022-10-18 DIAGNOSIS — E669 Obesity, unspecified: Secondary | ICD-10-CM

## 2022-10-18 DIAGNOSIS — Z6835 Body mass index (BMI) 35.0-35.9, adult: Secondary | ICD-10-CM

## 2022-10-18 MED ORDER — VITAMIN D (ERGOCALCIFEROL) 1.25 MG (50000 UNIT) PO CAPS: 50000.0000 [IU] | ORAL_CAPSULE | ORAL | 0 refills | Status: DC

## 2022-11-01 ENCOUNTER — Encounter (INDEPENDENT_AMBULATORY_CARE_PROVIDER_SITE_OTHER): Payer: Self-pay | Admitting: Family Medicine

## 2022-11-01 ENCOUNTER — Ambulatory Visit (INDEPENDENT_AMBULATORY_CARE_PROVIDER_SITE_OTHER): Payer: Medicare HMO | Admitting: Family Medicine

## 2022-11-01 VITALS — BP 134/86 | HR 81 | Temp 98.3°F | Ht 72.0 in | Wt 257.4 lb

## 2022-11-01 DIAGNOSIS — E669 Obesity, unspecified: Secondary | ICD-10-CM | POA: Diagnosis not present

## 2022-11-01 DIAGNOSIS — R7303 Prediabetes: Secondary | ICD-10-CM

## 2022-11-01 DIAGNOSIS — E7849 Other hyperlipidemia: Secondary | ICD-10-CM | POA: Diagnosis not present

## 2022-11-01 DIAGNOSIS — I10 Essential (primary) hypertension: Secondary | ICD-10-CM | POA: Diagnosis not present

## 2022-11-01 DIAGNOSIS — E559 Vitamin D deficiency, unspecified: Secondary | ICD-10-CM | POA: Diagnosis not present

## 2022-11-01 DIAGNOSIS — Z6834 Body mass index (BMI) 34.0-34.9, adult: Secondary | ICD-10-CM

## 2022-11-02 ENCOUNTER — Ambulatory Visit (INDEPENDENT_AMBULATORY_CARE_PROVIDER_SITE_OTHER): Payer: Medicare HMO | Admitting: Adult Health

## 2022-11-02 VITALS — BP 130/82 | HR 64 | Temp 97.7°F | Ht 71.75 in | Wt 264.0 lb

## 2022-11-02 DIAGNOSIS — R7303 Prediabetes: Secondary | ICD-10-CM

## 2022-11-02 DIAGNOSIS — Z6837 Body mass index (BMI) 37.0-37.9, adult: Secondary | ICD-10-CM | POA: Diagnosis not present

## 2022-11-02 DIAGNOSIS — E7849 Other hyperlipidemia: Secondary | ICD-10-CM

## 2022-11-02 DIAGNOSIS — G4733 Obstructive sleep apnea (adult) (pediatric): Secondary | ICD-10-CM | POA: Diagnosis not present

## 2022-11-02 DIAGNOSIS — E669 Obesity, unspecified: Secondary | ICD-10-CM | POA: Diagnosis not present

## 2022-11-02 DIAGNOSIS — Z0000 Encounter for general adult medical examination without abnormal findings: Secondary | ICD-10-CM | POA: Diagnosis not present

## 2022-11-02 DIAGNOSIS — I252 Old myocardial infarction: Secondary | ICD-10-CM

## 2022-11-02 DIAGNOSIS — I10 Essential (primary) hypertension: Secondary | ICD-10-CM

## 2022-11-02 DIAGNOSIS — N401 Enlarged prostate with lower urinary tract symptoms: Secondary | ICD-10-CM | POA: Diagnosis not present

## 2022-11-02 DIAGNOSIS — R351 Nocturia: Secondary | ICD-10-CM

## 2022-11-02 NOTE — Progress Notes (Signed)
Subjective:    Patient ID: Jacob Quinn, male    DOB: 08/12/1956, 67 y.o.   MRN: [id number]  HPI Patient presents for yearly preventative medicine examination. He is a 67 year old male who  has a past medical history of 1st degree AV block, Back pain, BPH associated with nocturia, CAD in native artery, Encephalitis, viral, GERD (gastroesophageal reflux disease), Hyperlipidemia (08/07/2016), Hypertension, Ischemic cardiomyopathy, Joint pain, Myocardial infarction ([place]) (2017), OA (osteoarthritis), Obesity (BMI 30-39.9) (08/07/2016), Renal insufficiency, Restless leg syndrome (09/16/2010), Rheumatic fever, and Syncope (2009).  Obesity -is being seen at healthy weight wellness clinic.  Since starting this journey back in November 2023 he has lost roughly 20 pounds since started. He is feeling better overall. Is joining a gym  Wt Readings from Last 5 Encounters:  11/02/22 264 lb (119.7 kg)  11/01/22 257 lb 6.4 oz (116.8 kg)  10/18/22 260 lb (117.9 kg)  10/03/22 266 lb (120.7 kg)  09/19/22 276 lb (125.2 kg)   BPH - managed with Flomax 0.4 mg - feels as though this medication works well to alleviate his symptoms. He missed the call for Urology and would like to have another referral to see them.   OSA - does not have a CPAP, he reports that the [place] sent him a home test but " could not get it to work" and never followed up. He would like to be referred to Pulmonary through [facility]   Pre diabetes - not currently on medication.  Lab Results  Component Value Date   HGBA1C 6.1 (H) 09/19/2022   Essential Hypertension -controlled with Lasix, Norvasc, and atenolol.  He denies dizziness, lightheadedness, chest pain, or shortness of breath  Hyperlipidemia -managed with Lipitor 80 mg daily.  He denies myalgia or fatigue Lab Results  Component Value Date   CHOL 150 09/19/2022   HDL 34 (L) 09/19/2022   LDLCALC 78 09/19/2022   LDLDIRECT 110.4 02/19/2007   TRIG 230 (H) 09/19/2022   CHOLHDL 4  11/11/2019   H/o NSTEMI in 2017 -had stent placed in the left circumflex artery in October 2017.  He denies symptoms such as chest pain, palpitations, lower extremity edema, or DOE  All immunizations and health maintenance protocols were reviewed with the patient and needed orders were placed.  Appropriate screening laboratory values were ordered for the patient including screening of hyperlipidemia, renal function and hepatic function.  Medication reconciliation,  past medical history, social history, problem list and allergies were reviewed in detail with the patient  Goals were established with regard to weight loss, exercise, and  diet in compliance with medications.  Is working on diet through healthy weight and wellness and walking 15 to 20 minutes every day of the week  Review of Systems  Constitutional: Negative.   HENT: Negative.    Eyes: Negative.   Respiratory: Negative.    Cardiovascular: Negative.   Gastrointestinal: Negative.   Endocrine: Negative.   Genitourinary: Negative.   Musculoskeletal: Negative.   Skin: Negative.   Allergic/Immunologic: Negative.   Neurological: Negative.   Hematological: Negative.   Psychiatric/Behavioral: Negative.    All other systems reviewed and are negative.  Past Medical History:  Diagnosis Date   1st degree AV block    Back pain    BPH associated with nocturia    CAD in native artery    a. NSTEMI 07/2016 -  LHC 08/07/16 showing 70% D1, 20% mLAD, segmental 95% then 75% OM stenosis, 85% acute marg, normal LVEDP - received PTCA/DES  to OM.   Encephalitis, viral    GERD (gastroesophageal reflux disease)    Hyperlipidemia 08/07/2016   Hypertension    Ischemic cardiomyopathy    a. LHC 07/2016 - mild mid anterolateral focal hypocontractility felt to be due to the circumflex stenosis, EF 50%.   Joint pain    Myocardial infarction ([place]) 2017   OA (osteoarthritis)    Obesity (BMI 30-39.9) 08/07/2016   Renal insufficiency    a. 2016 r/t  dehydration/ACEI use.   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[employer]    Comment: [name]  Tobacco Use   Smoking status: Never   Smokeless tobacco: Never  Vaping Use   Vaping Use: Never used  Substance and Sexual Activity   Alcohol use: No   Drug use: No   Sexual activity: Yes  Other Topics Concern   Not on file  Social History Narrative   Lives with daughter   Is retired -    Social Determinants of [place] Strain: [place]  (05/22/2022)   Overall Financial Resource Strain (CARDIA)    Difficulty of Paying Living Expenses: Not hard at all  Food Insecurity: No Food Insecurity (05/22/2022)   Hunger Vital Sign    Worried About Running Out of Food in the Last Year: Never true    [place] in the Last Year: Never true  Transportation Needs: No Transportation Needs (05/22/2022)   PRAPARE - Hydrologist (Medical): No    Lack of Transportation (Non-Medical): No  Physical Activity: Inactive (05/22/2022)   Exercise Vital Sign    Days of Exercise per Week: 0 days    Minutes of Exercise per Session: 0 min  Stress: No Stress Concern Present (05/22/2022)   [place]    Feeling of Stress : Not at all  Social Connections: Moderately Integrated (05/22/2022)   Social Connection and Isolation Panel [NHANES]    Frequency of Communication with Friends and Family: More than three times a week    Frequency of Social Gatherings with Friends and Family: More than three times a week    Attends Religious Services: More than 4 times per year    Active  Member of Clubs or Organizations: Yes    Attends Archivist Meetings: More than 4 times per year    Marital Status: Divorced  Intimate Partner Violence: Not At Risk (05/22/2022)   Humiliation, Afraid, Rape, and Kick questionnaire    Fear of Current or Ex-Partner: No    Emotionally Abused: No    Physically Abused: No    Sexually Abused: No    Past Surgical History:  Procedure Laterality Date   CARDIAC CATHETERIZATION N/A 08/07/2016   Procedure: Left Heart Cath and Coronary Angiography;  Surgeon: Troy Sine, MD;  Location: [facility];  Service: Cardiovascular;  Laterality: N/A;   CARDIAC CATHETERIZATION N/A 08/07/2016   Procedure: Coronary Stent Intervention;  Surgeon: Troy Sine, MD;  Location: [facility];  Service: Cardiovascular;  Laterality:  N/A;   COLONOSCOPY  05/21/2012   COLONOSCOPY  12/29/2019   POLYPECTOMY     REPLACEMENT TOTAL KNEE     left and right-  5 total    TONSILLECTOMY     TOTAL KNEE REVISION Left 11/19/2017   Procedure: LEFT TOTAL KNEE REVISION;  Surgeon: Frederik Pear, MD;  Location: [facility];  Service: Orthopedics;  Laterality: Left;   TOTAL KNEE REVISION Right 04/22/2018   Procedure: RIGHT TOTAL KNEE REVISION;  Surgeon: Frederik Pear, MD;  Location: [facility];  Service: Orthopedics;  Laterality: Right;  block    Family History  Problem Relation Age of Onset   Hypertension Mother    Heart disease Mother    Obesity Mother    Prostate cancer Father    Dementia Father    Depression Brother    Hypertension Brother    Hypertension Brother    Stroke Brother    Colon cancer Neg Hx    Colon polyps Neg Hx    Esophageal cancer Neg Hx    Rectal cancer Neg Hx    Stomach cancer Neg Hx     Allergies  Allergen Reactions   Penicillins Anaphylaxis    *Has tolerated cephalosporins in the past*  Has patient had a PCN reaction causing immediate rash, facial/tongue/throat swelling, SOB or lightheadedness with hypotension: Yes Has patient had a  PCN reaction causing severe rash involving mucus membranes or skin necrosis: No Has patient had a PCN reaction that required hospitalization: No Has patient had a PCN reaction occurring within the last 10 years: No If all of the above answers are "NO", then may proceed with Cephalosporin use.    Lisinopril     Cramp    Tizanidine Other (See Comments)    High fevers, shaking   Latex Rash    Current Outpatient Medications on File Prior to Visit  Medication Sig Dispense Refill   amLODipine (NORVASC) 10 MG tablet Take 1 tablet (10 mg total) by mouth daily. 90 tablet 1   aspirin 81 MG tablet Take 81 mg by mouth daily.     atenolol (TENORMIN) 25 MG tablet Take 25 mg by mouth daily.     atorvastatin (LIPITOR) 80 MG tablet Take by mouth.     Multiple Vitamin (MULTI VITAMIN DAILY) TABS Take 1 tablet by mouth daily.     nitroGLYCERIN (NITROSTAT) 0.4 MG SL tablet Place 1 tablet (0.4 mg total) under the tongue every 5 (five) minutes as needed for chest pain. Up to 3 doses. 25 tablet 3   tamsulosin (FLOMAX) 0.4 MG CAPS capsule Take 1 capsule (0.4 mg total) by mouth daily after breakfast. TAKE 1 CAPSULE BY MOUTH DAILY. **DUE FOR YEARLY PHYSICAL** 90 capsule 0   Vitamin D, Ergocalciferol, (DRISDOL) 1.25 MG (50000 UNIT) CAPS capsule Take 1 capsule (50,000 Units total) by mouth every 7 (seven) days. 4 capsule 0   furosemide (LASIX) 20 MG tablet Take 1 tablet (20 mg total) by mouth daily. MAY TAKE AN ADDITIONAL DOSE FOR ADDITIONAL SWELLING 20 tablet 0   No current facility-administered medications on file prior to visit.    BP 130/82   Pulse 64   Temp 97.7 F (36.5 C) (Oral)   Ht 5' 11.75" (1.822 m)   Wt 264 lb (119.7 kg)   SpO2 97%   BMI 36.05 kg/m       Objective:   Physical Exam Vitals and nursing note reviewed.  Constitutional:      General: He is not in acute distress.  Appearance: Normal appearance. He is well-developed. He is obese.  HENT:     Head: Normocephalic and atraumatic.      Right Ear: Tympanic membrane, ear canal and external ear normal. There is no impacted cerumen.     Left Ear: Tympanic membrane, ear canal and external ear normal. There is no impacted cerumen.     Nose: Nose normal. No congestion or rhinorrhea.     Mouth/Throat:     Mouth: Mucous membranes are moist.     Pharynx: Oropharynx is clear. No oropharyngeal exudate or posterior oropharyngeal erythema.  Eyes:     General:        Right eye: No discharge.        Left eye: No discharge.     Extraocular Movements: Extraocular movements intact.     Conjunctiva/sclera: Conjunctivae normal.     Pupils: Pupils are equal, round, and reactive to light.  Neck:     Vascular: No carotid bruit.     Trachea: No tracheal deviation.  Cardiovascular:     Rate and Rhythm: Normal rate and regular rhythm.     Pulses: Normal pulses.     Heart sounds: Normal heart sounds. No murmur heard.    No friction rub. No gallop.  Pulmonary:     Effort: Pulmonary effort is normal. No respiratory distress.     Breath sounds: Normal breath sounds. No stridor. No wheezing, rhonchi or rales.  Chest:     Chest wall: No tenderness.  Abdominal:     General: Bowel sounds are normal. There is no distension.     Palpations: Abdomen is soft. There is no mass.     Tenderness: There is no abdominal tenderness. There is no right CVA tenderness, left CVA tenderness, guarding or rebound.     Hernia: No hernia is present.  Musculoskeletal:        General: No swelling, tenderness, deformity or signs of injury. Normal range of motion.     Right lower leg: No edema.     Left lower leg: No edema.  Lymphadenopathy:     Cervical: No cervical adenopathy.  Skin:    General: Skin is warm and dry.     Capillary Refill: Capillary refill takes less than 2 seconds.     Coloration: Skin is not jaundiced or pale.     Findings: No bruising, erythema, lesion or rash.  Neurological:     General: No focal deficit present.     Mental Status: He  is alert and oriented to person, place, and time.     Cranial Nerves: No cranial nerve deficit.     Sensory: No sensory deficit.     Motor: No weakness.     Coordination: Coordination normal.     Gait: Gait normal.     Deep Tendon Reflexes: Reflexes normal.  Psychiatric:        Mood and Affect: Mood normal.        Behavior: Behavior normal.        Thought Content: Thought content normal.        Judgment: Judgment normal.       Assessment & Plan:  1. Routine general medical examination at a health care facility Today patient counseled on age appropriate routine health concerns for screening and prevention, each reviewed and up to date or declined. Immunizations reviewed and up to date or declined. Labs ordered and reviewed. Risk factors for depression reviewed and negative. Hearing function and visual acuity are intact. ADLs screened  and addressed as needed. Functional ability and level of safety reviewed and appropriate. Education, counseling and referrals performed based on assessed risks today. Patient provided with a copy of personalized plan for preventive services.  He had labs done one month ago and healthy weight and wellness. I see no reason to repeat these labs    2. Obesity with current BMI 34.9 - Per [facility] - Congratulated on weight loss so far   3. Other hyperlipidemia - Continue with statin therapy   4. Essential hypertension - Controlled. No change in medications   5. Prediabetes - Continue to monitor. Continue with weight loss measures   6. H/o NSTEMI back in 2017 - Continue with diet, exercise and current medications   7. BPH associated with nocturia - Continue Flomax - Ambulatory referral to Urology  8. OSA (obstructive sleep apnea)  - Ambulatory referral to Pulmonology  Dorothyann Peng, NP

## 2022-11-06 NOTE — Progress Notes (Signed)
Chief Complaint:   OBESITY Jacob Quinn is here to discuss his progress with his obesity treatment plan along with follow-up of his obesity related diagnoses. Jacob Quinn is on the Category 3 Plan +100 snack calories and states he is following his eating plan approximately 80-90% of the time. Jacob Quinn states he is walking 20 minutes for times per week.  Today's visit was #: 3 Starting weight: 23 LBS Starting date: 09/19/2022 Today's weight: 260 LBS Today's date: 10/18/2022 Total lbs lost to date: 16 LBS Total lbs lost since last in-office visit: 6 LBS  Interim History: Patient is focused on protein intake and still finding it difficult to eat at all.  He denies hunger or cravings.  Subjective:   1. Vitamin D deficiency Patient started ergocalciferol last office visit.  He takes it every Tuesday.  Assessment/Plan:  No orders of the defined types were placed in this encounter.   Medications Discontinued During This Encounter  Medication Reason   Vitamin D, Ergocalciferol, (DRISDOL) 1.25 MG (50000 UNIT) CAPS capsule Reorder     Meds ordered this encounter  Medications   Vitamin D, Ergocalciferol, (DRISDOL) 1.25 MG (50000 UNIT) CAPS capsule    Sig: Take 1 capsule (50,000 Units total) by mouth every 7 (seven) days.    Dispense:  4 capsule    Refill:  0     1. Vitamin D deficiency Refill- Vitamin D, Ergocalciferol, (DRISDOL) 1.25 MG (50000 UNIT) CAPS capsule; Take 1 capsule (50,000 Units total) by mouth every 7 (seven) days.  Dispense: 4 capsule; Refill: 0  2. Obesity with current BMI 35.3 Jacob Quinn is currently in the action stage of change. As such, his goal is to continue with weight loss efforts. He has agreed to the Category 3 Plan +100 snack calories.  Exercise goals:  As is.  Behavioral modification strategies: holiday eating strategies  and celebration eating strategies.  Jacob Quinn has agreed to follow-up with our clinic in 3 weeks. He was informed of the importance of frequent  follow-up visits to maximize his success with intensive lifestyle modifications for his multiple health conditions.   Objective:   Blood pressure (!) 153/78, pulse (!) 57, temperature 97.9 F (36.6 C), height 6' (1.829 m), weight 260 lb (117.9 kg), SpO2 97 %. Body mass index is 35.26 kg/m.  General: Cooperative, alert, well developed, in no acute distress. HEENT: Conjunctivae and lids unremarkable. Cardiovascular: Regular rhythm.  Lungs: Normal work of breathing. Neurologic: No focal deficits.   Lab Results  Component Value Date   CREATININE 1.20 09/19/2022   BUN 13 09/19/2022   NA 141 09/19/2022   K 4.4 09/19/2022   CL 101 09/19/2022   CO2 25 09/19/2022   Lab Results  Component Value Date   ALT 24 09/19/2022   AST 25 09/19/2022   ALKPHOS 98 09/19/2022   BILITOT 0.7 09/19/2022   Lab Results  Component Value Date   HGBA1C 6.1 (H) 09/19/2022   HGBA1C 5.7 (H) 04/22/2018   HGBA1C 5.6 01/30/2015   Lab Results  Component Value Date   INSULIN 26.5 (H) 09/19/2022   Lab Results  Component Value Date   TSH 1.790 09/19/2022   Lab Results  Component Value Date   CHOL 150 09/19/2022   HDL 34 (L) 09/19/2022   LDLCALC 78 09/19/2022   LDLDIRECT 110.4 02/19/2007   TRIG 230 (H) 09/19/2022   CHOLHDL 4 11/11/2019   Lab Results  Component Value Date   VD25OH 23.4 (L) 09/19/2022   Lab Results  Component  Value Date   WBC 7.6 09/19/2022   HGB 14.0 09/19/2022   HCT 42.3 09/19/2022   MCV 87 09/19/2022   PLT 273 09/19/2022   No results found for: "IRON", "TIBC", "FERRITIN"  Attestation Statements:   Reviewed by clinician on day of visit: allergies, medications, problem list, medical history, surgical history, family history, social history, and previous encounter notes.  I, Davy Pique, RMA, am acting as Location manager for [employer], DO.   I have reviewed the above documentation for accuracy and completeness, and I agree with the above. Marjory Sneddon,  D.O.  The [place] was signed into law in 2016 which includes the topic of electronic health records.  This provides immediate access to information in MyChart.  This includes consultation notes, operative notes, office notes, lab results and pathology reports.  If you have any questions about what you read please let us know at your next visit so we can discuss your concerns and take corrective action if need be.  We are right here with you.

## 2022-11-15 ENCOUNTER — Other Ambulatory Visit (HOSPITAL_COMMUNITY): Payer: Self-pay

## 2022-11-15 ENCOUNTER — Ambulatory Visit (INDEPENDENT_AMBULATORY_CARE_PROVIDER_SITE_OTHER): Payer: Medicare HMO | Admitting: Family Medicine

## 2022-11-15 ENCOUNTER — Encounter (INDEPENDENT_AMBULATORY_CARE_PROVIDER_SITE_OTHER): Payer: Self-pay | Admitting: Family Medicine

## 2022-11-15 VITALS — BP 147/88 | HR 74 | Temp 97.5°F | Ht 72.0 in | Wt 252.6 lb

## 2022-11-15 DIAGNOSIS — E669 Obesity, unspecified: Secondary | ICD-10-CM | POA: Diagnosis not present

## 2022-11-15 DIAGNOSIS — I10 Essential (primary) hypertension: Secondary | ICD-10-CM

## 2022-11-15 DIAGNOSIS — Z6834 Body mass index (BMI) 34.0-34.9, adult: Secondary | ICD-10-CM | POA: Diagnosis not present

## 2022-11-15 DIAGNOSIS — E559 Vitamin D deficiency, unspecified: Secondary | ICD-10-CM | POA: Diagnosis not present

## 2022-11-17 NOTE — Progress Notes (Unsigned)
Chief Complaint:   OBESITY Jacob Quinn is here to discuss his progress with his obesity treatment plan along with follow-up of his obesity related diagnoses. Jacob Quinn is on the Category 3 Plan +100 calories and states he is following his eating plan approximately 90% of the time. Jacob Quinn states he is walking 30 minutes 3 times per week.  Today's visit was #: 4 Starting weight: 49 LBS Starting date: 09/19/2022 Today's weight: 257 LBS Today's date: 11/01/2022 Total lbs lost to date: 19 LBS Total lbs lost since last in-office visit: 3 LBS  Interim History: Patient did excellent over the holidays and lost 3 pounds.  Patient is still struggling with wanting to eat and snack while watching football games especially this time a year.  Patient loves his wings and pizza.  Subjective:   1. Essential hypertension Patient takes atenolol, Norvasc, Lasix daily.  Despite weight loss, no signs or symptoms of hypotension.  Patient denies any new symptoms or concerns today.  Blood pressure improved from last office visit 153/78.  2. Other hyperlipidemia Positive history of STEMI in 2017.  Patient is on Lipitor 80 mg daily.  3. Vitamin D deficiency Patient is taking medication as prescribed.  Patient denies issues with medication, no nausea or GI upset.  Assessment/Plan:  No orders of the defined types were placed in this encounter.   There are no discontinued medications.   No orders of the defined types were placed in this encounter.    1. Essential hypertension Blood pressure at goal for age continue medications per PCP and specialist.  HBPM recommended every 2 to 3 days/week.  Decrease salt intake, increase exercise as tolerated.  Continue PNP and weight loss.  2. Other hyperlipidemia Continue medications per specialist PCP.  Reminded of the importance of avoiding animal fats and following meal plan to avoid high saturated and trans fat foods.  Continue exercise.  3. Vitamin D  deficiency Continue ergocalciferol.  Patient declines need for refill.  Continue weightbearing exercise.  Continue to monitor vitamin D levels while losing weight.  4. Obesity with current BMI 34.9 We discussed healthy ways to make the things he most in detail.  Recipe ideas written down and handouts given.  Recipe packet 1 given to patient today.  Healthy chili, sloppy Joe's with keto hamburger burns, healthy chicken cheese steak sandwiches reviewed.  Jacob Quinn is currently in the action stage of change. As such, his goal is to continue with weight loss efforts. He has agreed to the Category 3 Plan with 100 snack calories only.   Exercise goals:  As is.  Behavioral modification strategies: better snacking choices, emotional eating strategies, and avoiding temptations.  Jacob Quinn has agreed to follow-up with our clinic in 3-4 weeks. He was informed of the importance of frequent follow-up visits to maximize his success with intensive lifestyle modifications for his multiple health conditions.   Objective:   Blood pressure 134/86, pulse 81, temperature 98.3 F (36.8 C), height 6' (1.829 m), weight 257 lb 6.4 oz (116.8 kg), SpO2 97 %. Body mass index is 34.91 kg/m.  General: Cooperative, alert, well developed, in no acute distress. HEENT: Conjunctivae and lids unremarkable. Cardiovascular: Regular rhythm.  Lungs: Normal work of breathing. Neurologic: No focal deficits.   Lab Results  Component Value Date   CREATININE 1.20 09/19/2022   BUN 13 09/19/2022   NA 141 09/19/2022   K 4.4 09/19/2022   CL 101 09/19/2022   CO2 25 09/19/2022   Lab Results  Component Value  Date   ALT 24 09/19/2022   AST 25 09/19/2022   ALKPHOS 98 09/19/2022   BILITOT 0.7 09/19/2022   Lab Results  Component Value Date   HGBA1C 6.1 (H) 09/19/2022   HGBA1C 5.7 (H) 04/22/2018   HGBA1C 5.6 01/30/2015   Lab Results  Component Value Date   INSULIN 26.5 (H) 09/19/2022   Lab Results  Component Value Date    TSH 1.790 09/19/2022   Lab Results  Component Value Date   CHOL 150 09/19/2022   HDL 34 (L) 09/19/2022   LDLCALC 78 09/19/2022   LDLDIRECT 110.4 02/19/2007   TRIG 230 (H) 09/19/2022   CHOLHDL 4 11/11/2019   Lab Results  Component Value Date   VD25OH 23.4 (L) 09/19/2022   Lab Results  Component Value Date   WBC 7.6 09/19/2022   HGB 14.0 09/19/2022   HCT 42.3 09/19/2022   MCV 87 09/19/2022   PLT 273 09/19/2022   No results found for: "IRON", "TIBC", "FERRITIN"  Attestation Statements:   Reviewed by clinician on day of visit: allergies, medications, problem list, medical history, surgical history, family history, social history, and previous encounter notes.  I, Davy Pique, RMA, am acting as Location manager for [employer], DO.   I have reviewed the above documentation for accuracy and completeness, and I agree with the above. Marjory Sneddon, D.O.  The [place] was signed into law in 2016 which includes the topic of electronic health records.  This provides immediate access to information in MyChart.  This includes consultation notes, operative notes, office notes, lab results and pathology reports.  If you have any questions about what you read please let us know at your next visit so we can discuss your concerns and take corrective action if need be.  We are right here with you.

## 2022-11-30 ENCOUNTER — Ambulatory Visit (INDEPENDENT_AMBULATORY_CARE_PROVIDER_SITE_OTHER): Payer: Medicare HMO | Admitting: Family Medicine

## 2022-11-30 ENCOUNTER — Encounter (INDEPENDENT_AMBULATORY_CARE_PROVIDER_SITE_OTHER): Payer: Self-pay | Admitting: Family Medicine

## 2022-11-30 VITALS — BP 144/83 | HR 57 | Temp 97.9°F | Ht 72.0 in | Wt 248.4 lb

## 2022-11-30 DIAGNOSIS — E669 Obesity, unspecified: Secondary | ICD-10-CM

## 2022-11-30 DIAGNOSIS — E559 Vitamin D deficiency, unspecified: Secondary | ICD-10-CM

## 2022-11-30 DIAGNOSIS — I10 Essential (primary) hypertension: Secondary | ICD-10-CM

## 2022-11-30 DIAGNOSIS — Z6833 Body mass index (BMI) 33.0-33.9, adult: Secondary | ICD-10-CM

## 2022-11-30 MED ORDER — VITAMIN D (ERGOCALCIFEROL) 1.25 MG (50000 UNIT) PO CAPS: 50000.0000 [IU] | ORAL_CAPSULE | ORAL | 0 refills | Status: DC

## 2022-12-04 NOTE — Progress Notes (Signed)
Chief Complaint:   OBESITY Jacob Quinn is here to discuss his progress with his obesity treatment plan along with follow-up of his obesity related diagnoses. Jacob Quinn is on the Category 3 Plan + 100 calories and states he is following his eating plan approximately 80% of the time. Jacob Quinn states he is walking for 40 minutes 2 times per week.  Today's visit was #: 5 Starting weight: 276 lbs Starting date: 09/19/2022 Today's weight: 252 lbs Today's date: 11/15/2022 Total lbs lost to date: 24 Total lbs lost since last in-office visit: 5  Interim History: Jacob Quinn is here for a follow up office visit.  We reviewed his meal plan and all questions were answered.  Patient's food recall appears to be accurate and consistent with what is on plan when he is following it.   When eating on plan, his hunger and cravings are well controlled.  Jacob Quinn has no issues with the meal plan. He did not make dishes that we discussed at his last office visit. He is not eating all his protein.   Subjective:   1. Essential hypertension Jacob Quinn's blood pressure at home are 130's/80's twice weekly. He is taking Lasix, Norvasc, and Tenormin.   2. Vitamin D deficiency Jacob Quinn is tolerating medication(s) well without side effects.  Medication compliance is good as patient endorses taking it as prescribed.  Symptoms are stable and the patient denies additional concerns regarding this condition.    Assessment/Plan:  No orders of the defined types were placed in this encounter.   There are no discontinued medications.   No orders of the defined types were placed in this encounter.    1. Essential hypertension Blood pressure is not quite at goal today, but is at goal at home. Continue home blood pressure monitoring, decrease salt, and increase exercise.   2. Vitamin D deficiency Continue Ergo, declines a refill today. We will recheck Vit D level in 3 months.   3. Obesity with current BMI  34.3 Jacob Quinn is currently in the action stage of change. As such, his goal is to continue with weight loss efforts. He has agreed to the Category 3 Plan.   Increase protein intake.   Exercise goals: Resistance and strength training 3 days per week in addition to existing exercise.   Behavioral modification strategies: increasing lean protein intake and avoiding temptations.  Jacob Quinn has agreed to follow-up with our clinic in 2 weeks. He was informed of the importance of frequent follow-up visits to maximize his success with intensive lifestyle modifications for his multiple health conditions.   Objective:   Blood pressure (!) 147/88, pulse 74, temperature (!) 97.5 F (36.4 C), height 6' (1.829 m), weight 252 lb 9.6 oz (114.6 kg), SpO2 99 %. Body mass index is 34.26 kg/m.  General: Cooperative, alert, well developed, in no acute distress. HEENT: Conjunctivae and lids unremarkable. Cardiovascular: Regular rhythm.  Lungs: Normal work of breathing. Neurologic: No focal deficits.   Lab Results  Component Value Date   CREATININE 1.20 09/19/2022   BUN 13 09/19/2022   NA 141 09/19/2022   K 4.4 09/19/2022   CL 101 09/19/2022   CO2 25 09/19/2022   Lab Results  Component Value Date   ALT 24 09/19/2022   AST 25 09/19/2022   ALKPHOS 98 09/19/2022   BILITOT 0.7 09/19/2022   Lab Results  Component Value Date   HGBA1C 6.1 (H) 09/19/2022   HGBA1C 5.7 (H) 04/22/2018   HGBA1C 5.6 01/30/2015  Lab Results  Component Value Date   INSULIN 26.5 (H) 09/19/2022   Lab Results  Component Value Date   TSH 1.790 09/19/2022   Lab Results  Component Value Date   CHOL 150 09/19/2022   HDL 34 (L) 09/19/2022   LDLCALC 78 09/19/2022   LDLDIRECT 110.4 02/19/2007   TRIG 230 (H) 09/19/2022   CHOLHDL 4 11/11/2019   Lab Results  Component Value Date   VD25OH 23.4 (L) 09/19/2022   Lab Results  Component Value Date   WBC 7.6 09/19/2022   HGB 14.0 09/19/2022   HCT 42.3 09/19/2022   MCV 87  09/19/2022   PLT 273 09/19/2022   No results found for: "IRON", "TIBC", "FERRITIN"  Attestation Statements:   Reviewed by clinician on day of visit: allergies, medications, problem list, medical history, surgical history, family history, social history, and previous encounter notes.   Wilhemena Durie, am acting as transcriptionist for [employer], DO.   I have reviewed the above documentation for accuracy and completeness, and I agree with the above. Marjory Sneddon, D.O.  The [place] was signed into law in 2016 which includes the topic of electronic health records.  This provides immediate access to information in MyChart.  This includes consultation notes, operative notes, office notes, lab results and pathology reports.  If you have any questions about what you read please let us know at your next visit so we can discuss your concerns and take corrective action if need be.  We are right here with you.

## 2022-12-09 ENCOUNTER — Other Ambulatory Visit: Payer: Self-pay | Admitting: Adult Health

## 2022-12-09 DIAGNOSIS — N401 Enlarged prostate with lower urinary tract symptoms: Secondary | ICD-10-CM

## 2022-12-13 ENCOUNTER — Encounter (INDEPENDENT_AMBULATORY_CARE_PROVIDER_SITE_OTHER): Payer: Self-pay | Admitting: Family Medicine

## 2022-12-13 ENCOUNTER — Ambulatory Visit (INDEPENDENT_AMBULATORY_CARE_PROVIDER_SITE_OTHER): Payer: Medicare HMO | Admitting: Family Medicine

## 2022-12-13 VITALS — BP 144/84 | HR 49 | Temp 97.7°F | Ht 72.0 in | Wt 243.2 lb

## 2022-12-13 DIAGNOSIS — R7303 Prediabetes: Secondary | ICD-10-CM | POA: Diagnosis not present

## 2022-12-13 DIAGNOSIS — Z6833 Body mass index (BMI) 33.0-33.9, adult: Secondary | ICD-10-CM

## 2022-12-13 DIAGNOSIS — E559 Vitamin D deficiency, unspecified: Secondary | ICD-10-CM

## 2022-12-13 DIAGNOSIS — I10 Essential (primary) hypertension: Secondary | ICD-10-CM | POA: Diagnosis not present

## 2022-12-13 NOTE — Progress Notes (Signed)
Chief Complaint:   OBESITY Jacob Quinn is here to discuss his progress with his obesity treatment plan along with follow-up of his obesity related diagnoses. Jacob Quinn is on the Category 3 Plan and states he is following his eating plan approximately 80% of the time. [name] states he is going to gym 60 minutes 3 times per week.  Today's visit was #: 6 Starting weight: 276 lbs Starting date: 09/19/2022 Today's weight: 248 lbs Today's date: 11/30/2022 Total lbs lost to date: 28 lbs Total lbs lost since last in-office visit: 4  Interim History: AMIIR Quinn is here for a follow up office visit.  We reviewed his meal plan and all questions were answered.  Patient's food recall appears to be accurate and consistent with what is on plan when he is following it.   When eating on plan, his hunger and cravings are well controlled.   Lost over 3 lbs fat mass today verses last ov.  Subjective:   1. Essential hypertension 130-134/80s at home, checks 2 day a week.  Asymptomatic at concerns.   2. Vitamin D deficiency Vit D not at goal.  Improving energy level.  Tolerating well.  Assessment/Plan:    Medications Discontinued During This Encounter  Medication Reason   Vitamin D, Ergocalciferol, (DRISDOL) 1.25 MG (50000 UNIT) CAPS capsule Reorder     Meds ordered this encounter  Medications   Vitamin D, Ergocalciferol, (DRISDOL) 1.25 MG (50000 UNIT) CAPS capsule    Sig: Take 1 capsule (50,000 Units total) by mouth every 7 (seven) days.    Dispense:  4 capsule    Refill:  0     1. Essential hypertension Continue Norvasc, Tenormin and lasix.  Decrease salt, increase water.  Continue Prescribed Nutrition Plan and exercise to promote weight loss.   2. Vitamin D deficiency We will refill Vit D 50K IU once a week for 1 month with 0 refills.  Recheck labs in 3 months.  -Refill Vitamin D, Ergocalciferol, (DRISDOL) 1.25 MG (50000 UNIT) CAPS capsule; Take 1 capsule (50,000 Units total) by mouth  every 7 (seven) days.  Dispense: 4 capsule; Refill: 0  3. Obesity with current BMI 33.7 Jacob Quinn is currently in the action stage of change. As such, his goal is to continue with weight loss efforts. He has agreed to the Category 3 Plan with only 100 snack cal.   Exercise goals: As is.  Continue weight lifting as well.  Behavioral modification strategies: increasing lean protein intake, decreasing simple carbohydrates, and planning for success.  Jacob Quinn has agreed to follow-up with our clinic in 3 weeks. He was informed of the importance of frequent follow-up visits to maximize his success with intensive lifestyle modifications for his multiple health conditions.   Objective:   Blood pressure (!) 144/83, pulse (!) 57, temperature 97.9 F (36.6 C), height 6' (1.829 m), weight 248 lb 6.4 oz (112.7 kg), SpO2 96 %. Body mass index is 33.69 kg/m.  General: Cooperative, alert, well developed, in no acute distress. HEENT: Conjunctivae and lids unremarkable. Cardiovascular: Regular rhythm.  Lungs: Normal work of breathing. Neurologic: No focal deficits.   Lab Results  Component Value Date   CREATININE 1.20 09/19/2022   BUN 13 09/19/2022   NA 141 09/19/2022   K 4.4 09/19/2022   CL 101 09/19/2022   CO2 25 09/19/2022   Lab Results  Component Value Date   ALT 24 09/19/2022   AST 25 09/19/2022   ALKPHOS 98 09/19/2022   BILITOT 0.7 09/19/2022  Lab Results  Component Value Date   HGBA1C 6.1 (H) 09/19/2022   HGBA1C 5.7 (H) 04/22/2018   HGBA1C 5.6 01/30/2015   Lab Results  Component Value Date   INSULIN 26.5 (H) 09/19/2022   Lab Results  Component Value Date   TSH 1.790 09/19/2022   Lab Results  Component Value Date   CHOL 150 09/19/2022   HDL 34 (L) 09/19/2022   LDLCALC 78 09/19/2022   LDLDIRECT 110.4 02/19/2007   TRIG 230 (H) 09/19/2022   CHOLHDL 4 11/11/2019   Lab Results  Component Value Date   VD25OH 23.4 (L) 09/19/2022   Lab Results  Component Value Date   WBC  7.6 09/19/2022   HGB 14.0 09/19/2022   HCT 42.3 09/19/2022   MCV 87 09/19/2022   PLT 273 09/19/2022   No results found for: "IRON", "TIBC", "FERRITIN"  Attestation Statements:   Reviewed by clinician on day of visit: allergies, medications, problem list, medical history, surgical history, family history, social history, and previous encounter notes.  I, Brendell Tyus, CMA, am acting as transcriptionist for [employer], DO.   I have reviewed the above documentation for completeness, and I agree with the above. Marjory Sneddon, D.O.  The [place] was signed into law in 2016 which includes the topic of electronic health records.  This provides immediate access to information in MyChart.  This includes consultation notes, operative notes, office notes, lab results and pathology reports.  If you have any questions about what you read please let us know at your next visit so we can discuss your concerns and take corrective action if need be.  We are right here with you.

## 2022-12-14 ENCOUNTER — Ambulatory Visit (INDEPENDENT_AMBULATORY_CARE_PROVIDER_SITE_OTHER): Payer: Medicare HMO | Admitting: Family Medicine

## 2022-12-26 NOTE — Progress Notes (Unsigned)
Chief Complaint:   OBESITY Jacob Quinn is here to discuss his progress with his obesity treatment plan along with follow-up of his obesity related diagnoses. Jacob Quinn is on the Category 3 Plan and states he is following his eating plan approximately 80% of the time. Jacob Quinn states he is not exercising.  Today's visit was #: 7 Starting weight: 44 LBS Starting date: 09/19/2022 Today's weight: 243 LBS Today's date: 12/13/2022 Total lbs lost to date: 33 LBS Total lbs lost since last in-office visit: 5 LBS  Interim History: Jacob Quinn is here for a follow up office visit.  We reviewed his meal plan and all questions were answered.  Patient's food recall appears to be accurate and consistent with what is on plan when he is following it.   When eating on plan, his hunger and cravings are well controlled.       Subjective:   1. Vitamin D deficiency Jacob Quinn is tolerating medication(s) well without side effects.  Medication compliance is good as patient endorses taking it as prescribed.  Symptoms are stable and the patient denies additional concerns regarding this condition.  Patient is taking and ergocalciferol.  2. Essential hypertension At home blood pressures<140/90 on a regular basis.  Patient is asymptomatic with no concerns.  Patient is taking Norvasc, atenolol, and Lasix.  3. Prediabetes No issues with hunger or cravings.  Patient is not taking any medications.  Assessment/Plan:   Orders Placed This Encounter  Procedures   Comprehensive metabolic panel   Hemoglobin A1c   Insulin, random   Lipid Panel With LDL/HDL Ratio   VITAMIN D 25 Hydroxy (Vit-D Deficiency, Fractures)    There are no discontinued medications.   No orders of the defined types were placed in this encounter.    1. Vitamin D deficiency Patient declined need for refill today.  Continue ergocalciferol recheck vitamin D level at next office visit.  - VITAMIN D 25 Hydroxy (Vit-D Deficiency,  Fractures)  2. Essential hypertension Check labs at next office visit.  Continue medications.  Check blood pressure at home at least 2 to 3 days/week.  - Comprehensive metabolic panel - Lipid Panel With LDL/HDL Ratio  3. Prediabetes Continue PNP and increase exercise.  - Hemoglobin A1c - Insulin, random  4. BMI 33.0-33.9,adult-current bmi 33.0  5. Morbid obesity (HCC)-start bmi 37.43 Start 10-15 daily go to [facility] 3 days/week and lifting 30 minutes.  Jacob Quinn is currently in the action stage of change. As such, his goal is to continue with weight loss efforts. He has agreed to the Category 3 Plan with breakfast and lunch options.   Exercise goals: For substantial health benefits, adults should do at least 150 minutes (2 hours and 30 minutes) a week of moderate-intensity, or 75 minutes (1 hour and 15 minutes) a week of vigorous-intensity aerobic physical activity, or an equivalent combination of moderate- and vigorous-intensity aerobic activity. Aerobic activity should be performed in episodes of at least 10 minutes, and preferably, it should be spread throughout the week.  See above.  Behavioral modification strategies: increasing lean protein intake, decreasing simple carbohydrates, meal planning and cooking strategies, and planning for success.  Jacob Quinn has agreed to follow-up with our clinic in 3 weeks. He was informed of the importance of frequent follow-up visits to maximize his success with intensive lifestyle modifications for his multiple health conditions.   Objective:   Blood pressure (!) 144/84, pulse (!) 49, temperature 97.7 F (36.5 C), height 6' (1.829 m), weight 243  lb 3.2 oz (110.3 kg), SpO2 95 %. Body mass index is 32.98 kg/m.  General: Cooperative, alert, well developed, in no acute distress. HEENT: Conjunctivae and lids unremarkable. Cardiovascular: Regular rhythm.  Lungs: Normal work of breathing. Neurologic: No focal deficits.   Lab Results  Component Value  Date   CREATININE 1.20 09/19/2022   BUN 13 09/19/2022   NA 141 09/19/2022   K 4.4 09/19/2022   CL 101 09/19/2022   CO2 25 09/19/2022   Lab Results  Component Value Date   ALT 24 09/19/2022   AST 25 09/19/2022   ALKPHOS 98 09/19/2022   BILITOT 0.7 09/19/2022   Lab Results  Component Value Date   HGBA1C 6.1 (H) 09/19/2022   HGBA1C 5.7 (H) 04/22/2018   HGBA1C 5.6 01/30/2015   Lab Results  Component Value Date   INSULIN 26.5 (H) 09/19/2022   Lab Results  Component Value Date   TSH 1.790 09/19/2022   Lab Results  Component Value Date   CHOL 150 09/19/2022   HDL 34 (L) 09/19/2022   LDLCALC 78 09/19/2022   LDLDIRECT 110.4 02/19/2007   TRIG 230 (H) 09/19/2022   CHOLHDL 4 11/11/2019   Lab Results  Component Value Date   VD25OH 23.4 (L) 09/19/2022   Lab Results  Component Value Date   WBC 7.6 09/19/2022   HGB 14.0 09/19/2022   HCT 42.3 09/19/2022   MCV 87 09/19/2022   PLT 273 09/19/2022   No results found for: "IRON", "TIBC", "FERRITIN"  Attestation Statements:   Reviewed by clinician on day of visit: allergies, medications, problem list, medical history, surgical history, family history, social history, and previous encounter notes.  I, Davy Pique, RMA, am acting as Location manager for [employer], DO.  I have reviewed the above documentation for accuracy and completeness, and I agree with the above. Marjory Sneddon, D.O.  The [place] was signed into law in 2016 which includes the topic of electronic health records.  This provides immediate access to information in MyChart.  This includes consultation notes, operative notes, office notes, lab results and pathology reports.  If you have any questions about what you read please let us know at your next visit so we can discuss your concerns and take corrective action if need be.  We are right here with you.

## 2023-01-02 ENCOUNTER — Encounter (INDEPENDENT_AMBULATORY_CARE_PROVIDER_SITE_OTHER): Payer: Self-pay

## 2023-01-03 ENCOUNTER — Ambulatory Visit (INDEPENDENT_AMBULATORY_CARE_PROVIDER_SITE_OTHER): Payer: Medicare HMO | Admitting: Family Medicine

## 2023-01-03 NOTE — Progress Notes (Deleted)
  TeleHealth Visit:  This visit was completed with telemedicine (audio/video) technology. Yerick has verbally consented to this TeleHealth visit. The patient is located at home, the provider is located at home. The participants in this visit include the listed provider and patient. The visit was conducted today via MyChart video.  OBESITY Salome is here to discuss his progress with his obesity treatment plan along with follow-up of his obesity related diagnoses.   Today's visit was # 8 Starting weight: 276 lbs Starting date: 09/19/2022 Weight at last in office visit: 243 lbs on 12/13/22 Total weight loss: 33 lbs at last in office visit on 12/13/22. Today's reported weight: *** lbs No weight reported.  Nutrition Plan: the Category 3 plan and with breakfast and lunch options - ***% adherence.  Current exercise: {exercise types:16438} none  Interim History:  ***  Pharmacotherapy: Dirck is on {dwwpharmacotherapy:29109} Adverse side effects: {dwwse:29122} Hunger is {EWCONTROLASSESSMENT:24261}.  Cravings are {EWCONTROLASSESSMENT:24261}.  Assessment/Plan:  1. ***  2. ***  3. ***  {dwwmorbid:29108::"Morbid Obesity"}: Current BMI *** Pharmacotherapy Plan {dwwmed:29123}  {dwwpharmacotherapy:29109} [facility] {CHL AMB IS/IS NOT:210130109} currently in the action stage of change. As such, his goal is to {MWMwtloss#1:210800005}.  He has agreed to {dwwsldiets:29085}.  Exercise goals: {MWM EXERCISE RECS:23473}  Behavioral modification strategies: {dwwslwtlossstrategies:29088}.  Josua has agreed to follow-up with our clinic in {NUMBER 1-10:22536} weeks.   No orders of the defined types were placed in this encounter.   There are no discontinued medications.   No orders of the defined types were placed in this encounter.     Objective:   VITALS: Per patient if applicable, see vitals. GENERAL: Alert and in no acute distress. CARDIOPULMONARY: No increased WOB. Speaking in  clear sentences.  PSYCH: Pleasant and cooperative. Speech normal rate and rhythm. Affect is appropriate. Insight and judgement are appropriate. Attention is focused, linear, and appropriate.  NEURO: Oriented as arrived to appointment on time with no prompting.   Attestation Statements:   Reviewed by clinician on day of visit: allergies, medications, problem list, medical history, surgical history, family history, social history, and previous encounter notes.  ***(delete if time-based billing not used) Time spent on visit including the items listed below was *** minutes.  -preparing to see the patient (e.g., review of tests, history, previous notes) -obtaining and/or reviewing separately obtained history -counseling and educating the patient/family/caregiver -documenting clinical information in the electronic or other health record -ordering medications, tests, or procedures -independently interpreting results and communicating results to the patient/ family/caregiver -referring and communicating with other health care professionals  -care coordination   This was prepared with the assistance of Presenter, broadcasting.  Occasional wrong-word or sound-a-like substitutions may have occurred due to the inherent limitations of voice recognition software.

## 2023-01-04 ENCOUNTER — Telehealth (INDEPENDENT_AMBULATORY_CARE_PROVIDER_SITE_OTHER): Payer: Self-pay | Admitting: Family Medicine

## 2023-01-04 ENCOUNTER — Telehealth (INDEPENDENT_AMBULATORY_CARE_PROVIDER_SITE_OTHER): Payer: Medicare HMO | Admitting: Family Medicine

## 2023-01-04 ENCOUNTER — Encounter (INDEPENDENT_AMBULATORY_CARE_PROVIDER_SITE_OTHER): Payer: Self-pay

## 2023-01-04 DIAGNOSIS — Z6832 Body mass index (BMI) 32.0-32.9, adult: Secondary | ICD-10-CM

## 2023-01-04 DIAGNOSIS — E669 Obesity, unspecified: Secondary | ICD-10-CM

## 2023-01-04 NOTE — Telephone Encounter (Signed)
Spoke with patient on the phone.  He said he will have to reschedule because he was called unexpectedly to the [facility].

## 2023-01-25 ENCOUNTER — Encounter (INDEPENDENT_AMBULATORY_CARE_PROVIDER_SITE_OTHER): Payer: Self-pay | Admitting: Physician Assistant

## 2023-01-25 ENCOUNTER — Ambulatory Visit (INDEPENDENT_AMBULATORY_CARE_PROVIDER_SITE_OTHER): Payer: Medicare HMO | Admitting: Physician Assistant

## 2023-01-25 VITALS — BP 130/77 | HR 50 | Temp 97.8°F | Ht 72.0 in | Wt 236.0 lb

## 2023-01-25 DIAGNOSIS — R7303 Prediabetes: Secondary | ICD-10-CM

## 2023-01-25 DIAGNOSIS — I10 Essential (primary) hypertension: Secondary | ICD-10-CM

## 2023-01-25 DIAGNOSIS — E7849 Other hyperlipidemia: Secondary | ICD-10-CM

## 2023-01-25 DIAGNOSIS — Z6832 Body mass index (BMI) 32.0-32.9, adult: Secondary | ICD-10-CM | POA: Insufficient documentation

## 2023-01-25 DIAGNOSIS — G4762 Sleep related leg cramps: Secondary | ICD-10-CM | POA: Insufficient documentation

## 2023-01-25 DIAGNOSIS — E559 Vitamin D deficiency, unspecified: Secondary | ICD-10-CM | POA: Diagnosis not present

## 2023-01-25 MED ORDER — VITAMIN D (ERGOCALCIFEROL) 1.25 MG (50000 UNIT) PO CAPS: 50000.0000 [IU] | ORAL_CAPSULE | ORAL | 0 refills | Status: DC

## 2023-01-25 NOTE — Assessment & Plan Note (Signed)
Hypertension Hypertension reasonably well controlled and no significant medication side effects noted.  Medication(s): atenolol 25 mg daily    Amlodipine 10 mg daily   Lasix 20 mg daily  BP Readings from Last 3 Encounters:  01/25/23 130/77  12/13/22 (!) 144/84  11/30/22 (!) 144/83   Lab Results  Component Value Date   CREATININE 1.20 09/19/2022   CREATININE 1.89 (H) 12/29/2020   CREATININE 1.23 11/11/2019   Lab Results  Component Value Date   GFR 71.37 11/11/2019   GFR 81.56 04/30/2018   GFR 85.04 11/30/2017    Plan: Continue all antihypertensives at current dosages.Continue to work on nutrition plan to promote weight loss and improve BP control.  Check labs today.

## 2023-01-25 NOTE — Assessment & Plan Note (Addendum)
Vitamin D Deficiency Vitamin D is not at goal of 50.  Most recent vitamin D level was 23.4. He is on  prescription ergocalciferol 50,000 IU weekly. Lab Results  Component Value Date   VD25OH 23.4 (L) 09/19/2022    Plan: Refill prescription vitamin D 50,000 IU weekly. Recheck vit D level today.

## 2023-01-25 NOTE — Assessment & Plan Note (Addendum)
Prediabetes Last A1c was 6.1   Medication(s): None   He is working on nutrition plan to decrease simple carbohydrates, increase lean proteins and exercise to promote weight loss, improve glycemic control and prevent progression to Type 2 diabetes.  He gave up sodas when he started the program ( was drinking 4-5 sodas daily)  Lab Results  Component Value Date   HGBA1C 6.1 (H) 09/19/2022   HGBA1C 5.7 (H) 04/22/2018   HGBA1C 5.6 01/30/2015   Lab Results  Component Value Date   INSULIN 26.5 (H) 09/19/2022    Plan:  Continue working on nutrition plan to decrease simple carbohydrates, increase lean proteins and exercise to promote weight loss, improve glycemic control and prevent progression to Type 2 diabetes.  Recheck labs today.

## 2023-01-25 NOTE — Progress Notes (Signed)
Office: [phone]  /  Fax: [phone]  WEIGHT SUMMARY AND BIOMETRICS  Vitals Temp: 97.8 F (36.6 C) BP: 130/77 Pulse Rate: (!) 50 SpO2: 97 %   Anthropometric Measurements Height: 6' (1.829 m) Weight: 236 lb (107 kg) BMI (Calculated): 32 Weight at Last Visit: 243 lb Weight Lost Since Last Visit: 7 lb Weight Gained Since Last Visit: 0 lb Starting Weight: 276 lb Total Weight Loss (lbs): 41 lb (18.6 kg)   Body Composition  Body Fat %: 32.7 % Fat Mass (lbs): 77.4 lbs Muscle Mass (lbs): 151.4 lbs Total Body Water (lbs): 114.8 lbs Visceral Fat Rating : 19   Other Clinical Data Fasting: Yes Labs: Yes Today's Visit #: 8 Starting Date: 09/19/22     HPI  Chief Complaint: OBESITY  Jacob Quinn is here to discuss his progress with his obesity treatment plan. He is on the the Category 3 Plan and states he is following his eating plan approximately 75 % of the time. He states he is exercising/walking ? minutes 5 times per week.   Interval History:  Since last office visit he is down 7 lbs. /Total of 40 lbs.  He has not had a regular soda since starting the program in Nov. 2023! 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  Sleep is good and restorative for the most part, but notes frequent leg cramps which wake him from sleep at times.  Has family coming in for Easter.    Pharmacotherapy: None for weight loss.   PHYSICAL EXAM:  Blood pressure 130/77, pulse (!) 50, temperature 97.8 F (36.6 C), height 6' (1.829 m), weight 236 lb (107 kg), SpO2 97 %. Body mass index is 32.01 kg/m.  General: He is overweight, cooperative, alert, well developed, and in no acute distress. PSYCH: Has normal mood, affect and thought process.   Cardiovascular: regular rhythm Lungs: Normal breathing effort, no conversational dyspnea. Neuro: no focal deficits  DIAGNOSTIC DATA REVIEWED:  BMET     Component Value Date/Time   NA 141 09/19/2022 1141   K 4.4 09/19/2022 1141   CL 101 09/19/2022 1141   CO2 25 09/19/2022 1141   GLUCOSE 105 (H) 09/19/2022 1141   GLUCOSE 112 (H) 11/11/2019 0849   BUN 13 09/19/2022 1141   CREATININE 1.20 09/19/2022 1141   CALCIUM 9.7 09/19/2022 1141   GFRNONAA >60 04/23/2018 0544   GFRAA >60 04/23/2018 0544   Lab Results  Component Value Date   HGBA1C 6.1 (H) 09/19/2022   HGBA1C 5.6 01/30/2015   Lab Results  Component Value Date   INSULIN 26.5 (H) 09/19/2022   Lab Results  Component Value Date   TSH 1.790 09/19/2022   CBC    Component Value Date/Time   WBC 7.6 09/19/2022 1141   WBC 10.0 11/11/2019 0849   RBC 4.87 09/19/2022 1141   RBC 4.31 11/11/2019 0849   HGB 14.0 09/19/2022 1141   HCT 42.3 09/19/2022 1141   PLT 273 09/19/2022 1141   MCV 87 09/19/2022 1141   MCH 28.7 09/19/2022 1141   MCH 27.8 04/23/2018 0544   MCHC 33.1 09/19/2022 1141   MCHC 33.1 11/11/2019 0849   RDW 14.1 09/19/2022 1141   Iron Studies No results found for: "IRON", "TIBC", "FERRITIN", "IRONPCTSAT" Lipid Panel     Component Value Date/Time   CHOL 150 09/19/2022 1141   TRIG 230 (H) 09/19/2022 1141   HDL 34 (L) 09/19/2022  1141   CHOLHDL 4 11/11/2019 0849   VLDL 22.0 11/11/2019 0849   LDLCALC 78 09/19/2022 1141   LDLDIRECT 110.4 02/19/2007 0819   Hepatic Function Panel     Component Value Date/Time   PROT 7.4 09/19/2022 1141   ALBUMIN 4.3 09/19/2022 1141   AST 25 09/19/2022 1141   ALT 24 09/19/2022 1141   ALKPHOS 98 09/19/2022 1141   BILITOT 0.7 09/19/2022 1141   BILIDIR 0.1 02/06/2017 0808      Component Value Date/Time   TSH 1.790 09/19/2022 1141   Nutritional Lab Results  Component Value Date   VD25OH 23.4 (L) 09/19/2022    ASSOCIATED CONDITIONS ADDRESSED TODAY  ASSESSMENT AND PLAN  Problem List Items Addressed This Visit     Prediabetes - Primary    Prediabetes Last A1c was 6.1   Medication(s): None   He is working on  nutrition plan to decrease simple carbohydrates, increase lean proteins and exercise to promote weight loss, improve glycemic control and prevent progression to Type 2 diabetes.  He gave up sodas when he started the program ( was drinking 4-5 sodas daily)  Lab Results  Component Value Date   HGBA1C 6.1 (H) 09/19/2022   HGBA1C 5.7 (H) 04/22/2018   HGBA1C 5.6 01/30/2015   Lab Results  Component Value Date   INSULIN 26.5 (H) 09/19/2022   Plan:  Continue working on nutrition plan to decrease simple carbohydrates, increase lean proteins and exercise to promote weight loss, improve glycemic control and prevent progression to Type 2 diabetes.  Recheck labs today.        Relevant Orders   CMP14+EGFR   Hemoglobin A1c   Insulin, random   Other hyperlipidemia    Hyperlipidemia LDL is at goal. Medication(s): Lipitor 80 mg daily Cardiovascular risk factors: advanced age (older than 32 for men, 68 for women), dyslipidemia, hypertension, male gender, and obesity (BMI >= 30 kg/m2)  Lab Results  Component Value Date   CHOL 150 09/19/2022   HDL 34 (L) 09/19/2022   LDLCALC 78 09/19/2022   LDLDIRECT 110.4 02/19/2007   TRIG 230 (H) 09/19/2022   CHOLHDL 4 11/11/2019   CHOLHDL 3.1 10/10/2018   CHOLHDL 5.7 (H) 08/29/2017   Lab Results  Component Value Date   ALT 24 09/19/2022   AST 25 09/19/2022   ALKPHOS 98 09/19/2022   BILITOT 0.7 09/19/2022  The ASCVD Risk score (Arnett DK, et al., 2019) failed to calculate for the following reasons:   The patient has a prior MI or stroke diagnosis  Plan: Continue statin. Continue to follow nutrition plan and exercise to promote weight loss, improve lipids and reduce cardiovascular risks.          Relevant Orders   Lipid Panel With LDL/HDL Ratio   Vitamin D deficiency    Vitamin D Deficiency Vitamin D is not at goal of 50.  Most recent vitamin D level was 23.4. He is on  prescription ergocalciferol 50,000 IU weekly. Lab Results  Component  Value Date   VD25OH 23.4 (L) 09/19/2022   Plan: Refill prescription vitamin D 50,000 IU weekly. Recheck vit D level today.        Relevant Medications   Vitamin D, Ergocalciferol, (DRISDOL) 1.25 MG (50000 UNIT) CAPS capsule   Other Relevant Orders   VITAMIN D 25 Hydroxy (Vit-D Deficiency, Fractures)   Morbid obesity (HCC)-start bmi 37.43   Nocturnal leg cramps    Some history of restless legs, but reports mainly cramping at night. He sits up  on side of bed when occurs and improves over time, so could have vascular component as well.  Plan: Will also check magnesium level with other labs today.  Monitor       Relevant Orders   Magnesium   BMI 32.0-32.9,adult Current BMI 32.1   Essential hypertension (Chronic)    Hypertension Hypertension reasonably well controlled and no significant medication side effects noted.  Medication(s): atenolol 25 mg daily    Amlodipine 10 mg daily   Lasix 20 mg daily  BP Readings from Last 3 Encounters:  01/25/23 130/77  12/13/22 (!) 144/84  11/30/22 (!) 144/83   Lab Results  Component Value Date   CREATININE 1.20 09/19/2022   CREATININE 1.89 (H) 12/29/2020   CREATININE 1.23 11/11/2019   Lab Results  Component Value Date   GFR 71.37 11/11/2019   GFR 81.56 04/30/2018   GFR 85.04 11/30/2017   Plan: Continue all antihypertensives at current dosages.Continue to work on nutrition plan to promote weight loss and improve BP control.  Check labs today.           TREATMENT PLAN FOR OBESITY:  Recommended Dietary Goals  Jacob Quinn is currently in the action stage of change. As such, his goal is to continue weight management plan. He has agreed to the Category 3 Plan.  Behavioral Intervention  We discussed the following Behavioral Modification Strategies today: increasing lean protein intake, decreasing simple carbohydrates , increasing vegetables, increasing water intake, work on meal planning and easy cooking plans, planning for success,  and keeping healthy foods at home.  Additional resources provided today: NA  Recommended Physical Activity Goals  Jacob Quinn has been advised to work up to 150 minutes of moderate intensity aerobic activity a week and strengthening exercises 2-3 times per week for cardiovascular health, weight loss maintenance and preservation of muscle mass.   He has agreed to Continue current level of physical activity     Return in about 4 weeks (around 02/22/2023).Marland Kitchen He was informed of the importance of frequent follow up visits to maximize his success with intensive lifestyle modifications for his multiple health conditions.   ATTESTASTION STATEMENTS:  Reviewed by clinician on day of visit: allergies, medications, problem list, medical history, surgical history, family history, social history, and previous encounter notes.   I have personally spent 32 minutes total time today in preparation, patient care, nutritional counseling and documentation for this visit, including the following: review of clinical lab tests; review of medical tests/procedures/services.      Jacquiline Zurcher, PA-C

## 2023-01-25 NOTE — Assessment & Plan Note (Signed)
Some history of restless legs, but reports mainly cramping at night. He sits up on side of bed when occurs and improves over time, so could have vascular component as well.  Plan: Will also check magnesium level with other labs today.  Monitor

## 2023-01-25 NOTE — Assessment & Plan Note (Signed)
Hyperlipidemia LDL is at goal. Medication(s): Lipitor 80 mg daily Cardiovascular risk factors: advanced age (older than 70 for men, 59 for women), dyslipidemia, hypertension, male gender, and obesity (BMI >= 30 kg/m2)  Lab Results  Component Value Date   CHOL 150 09/19/2022   HDL 34 (L) 09/19/2022   LDLCALC 78 09/19/2022   LDLDIRECT 110.4 02/19/2007   TRIG 230 (H) 09/19/2022   CHOLHDL 4 11/11/2019   CHOLHDL 3.1 10/10/2018   CHOLHDL 5.7 (H) 08/29/2017   Lab Results  Component Value Date   ALT 24 09/19/2022   AST 25 09/19/2022   ALKPHOS 98 09/19/2022   BILITOT 0.7 09/19/2022   The ASCVD Risk score (Arnett DK, et al., 2019) failed to calculate for the following reasons:   The patient has a prior MI or stroke diagnosis  Plan: Continue statin. Continue to follow nutrition plan and exercise to promote weight loss, improve lipids and reduce cardiovascular risks.

## 2023-01-27 LAB — CMP14+EGFR
ALT: 14 IU/L (ref 0–44)
AST: 16 IU/L (ref 0–40)
Albumin/Globulin Ratio: 1.5 (ref 1.2–2.2)
Albumin: 4.3 g/dL (ref 3.9–4.9)
Alkaline Phosphatase: 97 IU/L (ref 44–121)
BUN/Creatinine Ratio: 10 (ref 10–24)
BUN: 11 mg/dL (ref 8–27)
Bilirubin Total: 0.7 mg/dL (ref 0.0–1.2)
CO2: 23 mmol/L (ref 20–29)
Calcium: 9.7 mg/dL (ref 8.6–10.2)
Chloride: 98 mmol/L (ref 96–106)
Creatinine, Ser: 1.13 mg/dL (ref 0.76–1.27)
Globulin, Total: 2.9 g/dL (ref 1.5–4.5)
Glucose: 91 mg/dL (ref 70–99)
Potassium: 4.5 mmol/L (ref 3.5–5.2)
Sodium: 136 mmol/L (ref 134–144)
Total Protein: 7.2 g/dL (ref 6.0–8.5)
eGFR: 71 mL/min/{1.73_m2} (ref >59)

## 2023-01-27 LAB — MAGNESIUM: Magnesium: 2 mg/dL (ref 1.6–2.3)

## 2023-01-27 LAB — INSULIN, RANDOM: INSULIN: 7.4 u[IU]/mL (ref 2.6–24.9)

## 2023-01-27 LAB — LIPID PANEL WITH LDL/HDL RATIO
Cholesterol, Total: 183 mg/dL (ref 100–199)
HDL: 44 mg/dL (ref >39)
LDL Chol Calc (NIH): 122 mg/dL — ABNORMAL HIGH (ref 0–99)
LDL/HDL Ratio: 2.8 ratio (ref 0.0–3.6)
Triglycerides: 93 mg/dL (ref 0–149)
VLDL Cholesterol Cal: 17 mg/dL (ref 5–40)

## 2023-01-27 LAB — HEMOGLOBIN A1C
Est. average glucose Bld gHb Est-mCnc: 114 mg/dL
Hgb A1c MFr Bld: 5.6 % (ref 4.8–5.6)

## 2023-01-27 LAB — VITAMIN D 25 HYDROXY (VIT D DEFICIENCY, FRACTURES): Vit D, 25-Hydroxy: 33.4 ng/mL (ref 30.0–100.0)

## 2023-02-07 ENCOUNTER — Encounter: Payer: Self-pay | Admitting: Gastroenterology

## 2023-02-22 ENCOUNTER — Ambulatory Visit (INDEPENDENT_AMBULATORY_CARE_PROVIDER_SITE_OTHER): Payer: Medicare HMO | Admitting: Family Medicine

## 2023-02-22 ENCOUNTER — Encounter (INDEPENDENT_AMBULATORY_CARE_PROVIDER_SITE_OTHER): Payer: Self-pay | Admitting: Family Medicine

## 2023-02-22 VITALS — BP 132/86 | HR 46 | Temp 97.6°F | Ht 72.0 in | Wt 237.0 lb

## 2023-02-22 DIAGNOSIS — Z6833 Body mass index (BMI) 33.0-33.9, adult: Secondary | ICD-10-CM

## 2023-02-22 DIAGNOSIS — Z6832 Body mass index (BMI) 32.0-32.9, adult: Secondary | ICD-10-CM

## 2023-02-22 DIAGNOSIS — R7303 Prediabetes: Secondary | ICD-10-CM

## 2023-02-22 DIAGNOSIS — E559 Vitamin D deficiency, unspecified: Secondary | ICD-10-CM

## 2023-02-22 MED ORDER — VITAMIN D (ERGOCALCIFEROL) 1.25 MG (50000 UNIT) PO CAPS: 50000.0000 [IU] | ORAL_CAPSULE | ORAL | 0 refills | Status: DC

## 2023-02-22 NOTE — Progress Notes (Signed)
[name], D.O.  ABFM, ABOM Specializing in Clinical Bariatric Medicine  Office located at: [address] [place], [place]  [address]     Assessment and Plan:   No orders of the defined types were placed in this encounter.   Medications Discontinued During This Encounter  Medication Reason   furosemide (LASIX) 20 MG tablet    Vitamin D, Ergocalciferol, (DRISDOL) 1.25 MG (50000 UNIT) CAPS capsule Reorder     Meds ordered this encounter  Medications   Vitamin D, Ergocalciferol, (DRISDOL) 1.25 MG (50000 UNIT) CAPS capsule    Sig: Take 1 capsule (50,000 Units total) by mouth every 7 (seven) days.    Dispense:  4 capsule    Refill:  0    Prediabetes Assessment: Condition is improving, but not optimized. Lab Results  Component Value Date   HGBA1C 5.6 01/25/2023   HGBA1C 6.1 (H) 09/19/2022   HGBA1C 5.7 (H) 04/22/2018   INSULIN 7.4 01/25/2023   INSULIN 26.5 (H) 09/19/2022  - Patient is not taking any prediabetes medicatino. This is currently diet controlled - His cravings and hunger are controlled when eating on the meal plan.   Plan:  - Continue to decrease simple carbs/ sugars; increase fiber and proteins -> follow his meal plan.   Seung Nidiffer will continue to work on weight loss, exercise, via their meal plan we devised to help decrease the risk of progressing to diabetes.  - We will recheck A1c and fasting insulin level in approximately 3 months from last check, or as deemed appropriate.     Vitamin D deficiency Assessment: Condition is improving, but not optimized.  Lab Results  Component Value Date   VD25OH 33.4 01/25/2023   VD25OH 23.4 (L) 09/19/2022  - No issues with Ergocalciferol 50K IU weekly. Denies any side effects.   Plan: - Continue with med as prescribed. Will refill this today.   - weight loss will likely improve availability of vitamin D, thus encouraged Steen to continue with meal plan and their weight loss efforts to further improve this  condition.   TREATMENT PLAN FOR OBESITY: BMI 33.0-33.9,adult-current bmi 32.14 Morbid obesity (HCC)-start bmi 37.43 Assessment: Condition is not optimized. Biometric data collected today, was reviewed with patient.  Fat mass has increased by 3.6 lb. Muscle mass has decreased by 2.4 lb. Total body water has increased by 2.6 lb.   Plan:  Abundio is currently in the action stage of change. As such, his goal is to continue weight management plan. Terrian will work on healthier eating habits and continue the Category 3 meal plan.  - I again reviewed the Category 3 meal plan with the patient and emphasized the importance of him increasing his lean protein intake.  - I reviewed  the protein content of food handout with the patient.  - I encouraged the patient to journal to increase awareness about his eating habits and daily protein consumption.  - Counseling was done on how to use food logging app.   Behavioral Intervention Additional resources provided today: category 3 meal plan information, protein content of food handout Evidence-based interventions for health behavior change were utilized today including the discussion of self monitoring techniques, problem-solving barriers and SMART goal setting techniques.   Regarding patient's less desirable eating habits and patterns, we employed the technique of small changes.  Pt will specifically work on: journaling intake and bringing food log for next visit.    Recommended Physical Activity Goals Dijuan has been advised to work  up to 150 minutes of moderate intensity aerobic activity a week and strengthening exercises 2-3 times per week for cardiovascular health, weight loss maintenance and preservation of muscle mass.  He has agreed to Continue current level of physical activity   FOLLOW UP: No follow-ups on file. He was informed of the importance of frequent follow up visits to maximize his success with intensive lifestyle modifications for his  multiple health conditions.  Subjective:   Chief complaint: Obesity Ahmon is here to discuss his progress with his obesity treatment plan. He is on the the Category 3 Plan and states he is following his eating plan approximately 50% of the time. He states he is walking  30-45 minutes 3 days per week.  Interval History:  QUINTAVIUS NIEBUHR is here for a follow up office visit.  Since last office visit: - He went to [place] for vacation and endorses eating higher caloric food during his time there.  - He does not measure his protein intake.  - He typically drinks 2 protein shakes daily. - When eating on plan, his hunger and cravings are well controlled.      Review of Systems:  Pertinent positives were addressed with patient today.  Weight Summary and Biometrics   No data recorded Weight Gained Since Last Visit: 1 lb    Vitals Temp: 97.6 F (36.4 C) BP: 132/86 Pulse Rate: (!) 46 SpO2: 97 %   Anthropometric Measurements Height: 6' (1.829 m) Weight: 237 lb (107.5 kg) BMI (Calculated): 32.14 Weight at Last Visit: 236 lb Weight Gained Since Last Visit: 1 lb Starting Weight: 276 lb Total Weight Loss (lbs): 40 lb (18.1 kg)   Body Composition  Body Fat %: 34.1 % Fat Mass (lbs): 81 lbs Muscle Mass (lbs): 149 lbs Total Body Water (lbs): 117.4 lbs Visceral Fat Rating : 20   Other Clinical Data Fasting: No Labs: No Today's Visit #: 9 Starting Date: 09/19/22   Objective:   PHYSICAL EXAM: Blood pressure 132/86, pulse (!) 46, temperature 97.6 F (36.4 C), height 6' (1.829 m), weight 237 lb (107.5 kg), SpO2 97 %. Body mass index is 32.14 kg/m.  General: Well Developed, well nourished, and in no acute distress.  HEENT: Normocephalic, atraumatic Skin: Warm and dry, cap RF less 2 sec, good turgor Chest:  Normal excursion, shape, no gross abn Respiratory: speaking in full sentences, no conversational dyspnea NeuroM-Sk: Ambulates w/o assistance, moves * 4 Psych: A  and O *3, insight good, mood-full  DIAGNOSTIC DATA REVIEWED:  BMET    Component Value Date/Time   NA 136 01/25/2023 1103   K 4.5 01/25/2023 1103   CL 98 01/25/2023 1103   CO2 23 01/25/2023 1103   GLUCOSE 91 01/25/2023 1103   GLUCOSE 112 (H) 11/11/2019 0849   BUN 11 01/25/2023 1103   CREATININE 1.13 01/25/2023 1103   CALCIUM 9.7 01/25/2023 1103   GFRNONAA >60 04/23/2018 0544   GFRAA >60 04/23/2018 0544   Lab Results  Component Value Date   HGBA1C 5.6 01/25/2023   HGBA1C 5.6 01/30/2015   Lab Results  Component Value Date   INSULIN 7.4 01/25/2023   INSULIN 26.5 (H) 09/19/2022   Lab Results  Component Value Date   TSH 1.790 09/19/2022   CBC    Component Value Date/Time   WBC 7.6 09/19/2022 1141   WBC 10.0 11/11/2019 0849   RBC 4.87 09/19/2022 1141   RBC 4.31 11/11/2019 0849   HGB 14.0 09/19/2022 1141   HCT 42.3 09/19/2022 1141  PLT 273 09/19/2022 1141   MCV 87 09/19/2022 1141   MCH 28.7 09/19/2022 1141   MCH 27.8 04/23/2018 0544   MCHC 33.1 09/19/2022 1141   MCHC 33.1 11/11/2019 0849   RDW 14.1 09/19/2022 1141   Iron Studies No results found for: "IRON", "TIBC", "FERRITIN", "IRONPCTSAT" Lipid Panel     Component Value Date/Time   CHOL 183 01/25/2023 1103   TRIG 93 01/25/2023 1103   HDL 44 01/25/2023 1103   CHOLHDL 4 11/11/2019 0849   VLDL 22.0 11/11/2019 0849   LDLCALC 122 (H) 01/25/2023 1103   LDLDIRECT 110.4 02/19/2007 0819   Hepatic Function Panel     Component Value Date/Time   PROT 7.2 01/25/2023 1103   ALBUMIN 4.3 01/25/2023 1103   AST 16 01/25/2023 1103   ALT 14 01/25/2023 1103   ALKPHOS 97 01/25/2023 1103   BILITOT 0.7 01/25/2023 1103   BILIDIR 0.1 02/06/2017 0808      Component Value Date/Time   TSH 1.790 09/19/2022 1141   Nutritional Lab Results  Component Value Date   VD25OH 33.4 01/25/2023   VD25OH 23.4 (L) 09/19/2022    Attestations:   Reviewed by clinician on day of visit: allergies, medications, problem list, medical  history, surgical history, family history, social history, and previous encounter notes.  I,Special Puri,acting as a Neurosurgeon for [employer], DO.,have documented all relevant documentation on the behalf of Thomasene Lot, DO,as directed by  Thomasene Lot, DO while in the presence of Thomasene Lot, DO.   I, Thomasene Lot, DO, have reviewed all documentation for this visit. The documentation on 02/22/23 for the exam, diagnosis, procedures, and orders are all accurate and complete.

## 2023-03-14 ENCOUNTER — Other Ambulatory Visit: Payer: Self-pay | Admitting: Adult Health

## 2023-03-14 DIAGNOSIS — N401 Enlarged prostate with lower urinary tract symptoms: Secondary | ICD-10-CM

## 2023-03-19 ENCOUNTER — Ambulatory Visit (INDEPENDENT_AMBULATORY_CARE_PROVIDER_SITE_OTHER): Payer: Medicare HMO | Admitting: Family Medicine

## 2023-03-19 ENCOUNTER — Encounter (INDEPENDENT_AMBULATORY_CARE_PROVIDER_SITE_OTHER): Payer: Self-pay | Admitting: Family Medicine

## 2023-03-19 VITALS — BP 154/95 | HR 54 | Temp 97.7°F | Ht 72.0 in | Wt 230.0 lb

## 2023-03-19 DIAGNOSIS — Z6832 Body mass index (BMI) 32.0-32.9, adult: Secondary | ICD-10-CM

## 2023-03-19 DIAGNOSIS — E559 Vitamin D deficiency, unspecified: Secondary | ICD-10-CM

## 2023-03-19 DIAGNOSIS — Z6831 Body mass index (BMI) 31.0-31.9, adult: Secondary | ICD-10-CM

## 2023-03-19 DIAGNOSIS — R7303 Prediabetes: Secondary | ICD-10-CM | POA: Diagnosis not present

## 2023-03-19 DIAGNOSIS — I10 Essential (primary) hypertension: Secondary | ICD-10-CM | POA: Diagnosis not present

## 2023-03-19 MED ORDER — VITAMIN D (ERGOCALCIFEROL) 1.25 MG (50000 UNIT) PO CAPS
50000.0000 [IU] | ORAL_CAPSULE | ORAL | 0 refills | Status: DC
Start: 2023-03-19 — End: 2023-04-23

## 2023-03-19 NOTE — Progress Notes (Signed)
Jacob Quinn, D.O.  ABFM, ABOM Specializing in Clinical Bariatric Medicine  Office located at: [address] [place], [place]  [address]     Assessment and Plan:   Medications Discontinued During This Encounter  Medication Reason   Vitamin D, Ergocalciferol, (DRISDOL) 1.25 MG (50000 UNIT) CAPS capsule Reorder     Meds ordered this encounter  Medications   Vitamin D, Ergocalciferol, (DRISDOL) 1.25 MG (50000 UNIT) CAPS capsule    Sig: Take 1 capsule (50,000 Units total) by mouth every 7 (seven) days.    Dispense:  4 capsule    Refill:  0     Vitamin D deficiency Assessment: Condition is improving, but not optimized. Lab Results  Component Value Date   VD25OH 33.4 01/25/2023   VD25OH 23.4 (L) 09/19/2022  - Reports good compliance and tolerance with Ergocalciferol 50K IU weekly. Denies any side effects.  Plan: - Continue with med. Will refill this today.   - Will continue to monitor levels regularly  to keep levels within normal limits and prevent over supplementation.   Prediabetes Assessment: Condition is improving, but not optimized. Lab Results  Component Value Date   HGBA1C 5.6 01/25/2023   HGBA1C 6.1 (H) 09/19/2022   HGBA1C 5.7 (H) 04/22/2018   INSULIN 7.4 01/25/2023   INSULIN 26.5 (H) 09/19/2022  - Not on any prediabetic medication. This is diet controlled - Apart from occasional sweet cravings, his hunger and cravings have been pretty well controlled.   Plan: - Korvin will continue to work on weight loss, exercise, via their meal plan we devised to help decrease the risk of progressing to diabetes.    Essential hypertension Assessment: Condition is not optimized. Last 3 blood pressure readings in our office are as follows: BP Readings from Last 3 Encounters:  03/19/23 (!) 154/95  02/22/23 132/86  01/25/23 130/77  - Patient endorses that in the last 2 days, he missed taking his Norvasc 10 mg because he was busy.  - Today he is asymptomatic,  no concerns.   Plan: -I highly encouraged Filipe to take his blood pressure medicine consistently to decrease his risk of future heart attacks/strokes. Pt has agreed to this.  - Continue with Prudent nutritional plan and low sodium diet, advance exercise as tolerated.   - We will continue to monitor symptoms as they relate to the his weight loss journey.    TREATMENT PLAN FOR OBESITY: BMI 32.0-32.9,adult Current BMI 31.19 Morbid obesity (HCC)-start bmi 37.43 Assessment:  [name] is here to discuss his progress with his obesity treatment plan along with follow-up of his obesity related diagnoses. See Medical Weight Management Flowsheet for complete bioelectrical impedance results.  Condition is improving, but not optimized. Biometric data collected today, was reviewed with patient.   Since last office visit on 02/22/23 patient's Fat mass has decreased by 6 lb. Muscle mass has decreased by 1.4 lb. Total body water has decreased by 6.8 lb.  Counseling done on how various foods will affect these numbers and how to maximize success  Total lbs lost to date: 46 Total weight loss percentage to date: 16.67   Plan:  - Caydan will work on healthier eating habits and continue Category 3 meal plan and if he desires he can journal his intake.   - Unless pre-existing renal or cardiopulmonary conditions exist which patient was told to limit their fluid intake by another provider, I recommended roughly one half of their weight in pounds, to be the approximate ounces  of non-caloric, non-caffeinated beverages they should drink per day; including more if they are engaging in exercise.   Behavioral Intervention Additional resources provided today: patient declined Evidence-based interventions for health behavior change were utilized today including the discussion of self monitoring techniques, problem-solving barriers and SMART goal setting techniques.   Regarding patient's less desirable eating  habits and patterns, we employed the technique of small changes.  Pt will specifically work on: lifting 2-3 days a week and taking blood pressure medicine consistently for next visit.    Recommended Physical Activity Goals  Jamorian has been advised to slowly work up to 150 minutes of moderate intensity aerobic activity a week and strengthening exercises 2-3 times per week for cardiovascular health, weight loss maintenance and preservation of muscle mass.   He has agreed to Continue current level of physical activity   FOLLOW UP: Return in about 4 weeks (around 04/16/2023). He was informed of the importance of frequent follow up visits to maximize his success with intensive lifestyle modifications for his multiple health conditions.  Subjective:   Chief complaint: Obesity [name] is here to discuss his progress with his obesity treatment plan. He is on the the Category 3 Plan  and states he is following his eating plan approximately 85% of the time. He states he is not exercising.   Interval History:  [name] is here for a follow up office visit.     Since last office visit:   - Has not been exercising because he hurt his leg from "coming down a step the wrong way".  - Patient has occasional cravings when he sees sweets, which his daughter brings.  - Typically snacks on cheese doodles.  - Did not start journaling.  - In general, his hunger and cravings are controlled.  - He does bowling 3 days a week.    Review of Systems:  Pertinent positives were addressed with patient today.  Weight Summary and Biometrics   Weight Lost Since Last Visit: 7 lb  Weight Gained Since Last Visit: 0    Vitals Temp: 97.7 F (36.5 C) BP: (!) 154/95 Pulse Rate: (!) 54 SpO2: 96 %   Anthropometric Measurements Height: 6' (1.829 m) Weight: 230 lb (104.3 kg) BMI (Calculated): 31.19 Weight at Last Visit: 237 lb Weight Lost Since Last Visit: 7 lb Weight Gained Since Last Visit:  0 Starting Weight: 276 lb Total Weight Loss (lbs): 47 lb (21.3 kg)   Body Composition  Body Fat %: 32.6 % Fat Mass (lbs): 75 lbs Muscle Mass (lbs): 147.6 lbs Total Body Water (lbs): 110.6 lbs Visceral Fat Rating : 19   Other Clinical Data Fasting: No Labs: No Today's Visit #: 10 Starting Date: 09/19/22   Objective:   PHYSICAL EXAM: Blood pressure (!) 154/95, pulse (!) 54, temperature 97.7 F (36.5 C), height 6' (1.829 m), weight 230 lb (104.3 kg), SpO2 96 %. Body mass index is 31.19 kg/m.  General: Well Developed, well nourished, and in no acute distress.  HEENT: Normocephalic, atraumatic Skin: Warm and dry, cap RF less 2 sec, good turgor Chest:  Normal excursion, shape, no gross abn Respiratory: speaking in full sentences, no conversational dyspnea NeuroM-Sk: Ambulates w/o assistance, moves * 4 Psych: A and O *3, insight good, mood-full  DIAGNOSTIC DATA REVIEWED:  BMET    Component Value Date/Time   NA 136 01/25/2023 1103   K 4.5 01/25/2023 1103   CL 98 01/25/2023 1103   CO2 23 01/25/2023 1103   GLUCOSE 91 01/25/2023  1103   GLUCOSE 112 (H) 11/11/2019 0849   BUN 11 01/25/2023 1103   CREATININE 1.13 01/25/2023 1103   CALCIUM 9.7 01/25/2023 1103   GFRNONAA >60 04/23/2018 0544   GFRAA >60 04/23/2018 0544   Lab Results  Component Value Date   HGBA1C 5.6 01/25/2023   HGBA1C 5.6 01/30/2015   Lab Results  Component Value Date   INSULIN 7.4 01/25/2023   INSULIN 26.5 (H) 09/19/2022   Lab Results  Component Value Date   TSH 1.790 09/19/2022   CBC    Component Value Date/Time   WBC 7.6 09/19/2022 1141   WBC 10.0 11/11/2019 0849   RBC 4.87 09/19/2022 1141   RBC 4.31 11/11/2019 0849   HGB 14.0 09/19/2022 1141   HCT 42.3 09/19/2022 1141   PLT 273 09/19/2022 1141   MCV 87 09/19/2022 1141   MCH 28.7 09/19/2022 1141   MCH 27.8 04/23/2018 0544   MCHC 33.1 09/19/2022 1141   MCHC 33.1 11/11/2019 0849   RDW 14.1 09/19/2022 1141   Iron Studies No results  found for: "IRON", "TIBC", "FERRITIN", "IRONPCTSAT" Lipid Panel     Component Value Date/Time   CHOL 183 01/25/2023 1103   TRIG 93 01/25/2023 1103   HDL 44 01/25/2023 1103   CHOLHDL 4 11/11/2019 0849   VLDL 22.0 11/11/2019 0849   LDLCALC 122 (H) 01/25/2023 1103   LDLDIRECT 110.4 02/19/2007 0819   Hepatic Function Panel     Component Value Date/Time   PROT 7.2 01/25/2023 1103   ALBUMIN 4.3 01/25/2023 1103   AST 16 01/25/2023 1103   ALT 14 01/25/2023 1103   ALKPHOS 97 01/25/2023 1103   BILITOT 0.7 01/25/2023 1103   BILIDIR 0.1 02/06/2017 0808      Component Value Date/Time   TSH 1.790 09/19/2022 1141   Nutritional Lab Results  Component Value Date   VD25OH 33.4 01/25/2023   VD25OH 23.4 (L) 09/19/2022    Attestations:   Reviewed by clinician on day of visit: allergies, medications, problem list, medical history, surgical history, family history, social history, and previous encounter notes.   I,Special Puri,acting as a Neurosurgeon for [employer], DO.,have documented all relevant documentation on the behalf of Thomasene Lot, DO,as directed by  Thomasene Lot, DO while in the presence of Thomasene Lot, DO.   I, Thomasene Lot, DO, have reviewed all documentation for this visit. The documentation on 03/19/23 for the exam, diagnosis, procedures, and orders are all accurate and complete.

## 2023-03-28 ENCOUNTER — Other Ambulatory Visit: Payer: Self-pay | Admitting: Family Medicine

## 2023-04-23 ENCOUNTER — Encounter (INDEPENDENT_AMBULATORY_CARE_PROVIDER_SITE_OTHER): Payer: Self-pay | Admitting: Family Medicine

## 2023-04-23 ENCOUNTER — Ambulatory Visit (INDEPENDENT_AMBULATORY_CARE_PROVIDER_SITE_OTHER): Payer: Medicare HMO | Admitting: Family Medicine

## 2023-04-23 VITALS — BP 112/72 | HR 59 | Temp 97.8°F | Ht 72.0 in | Wt 234.0 lb

## 2023-04-23 DIAGNOSIS — I10 Essential (primary) hypertension: Secondary | ICD-10-CM

## 2023-04-23 DIAGNOSIS — Z6831 Body mass index (BMI) 31.0-31.9, adult: Secondary | ICD-10-CM

## 2023-04-23 DIAGNOSIS — E65 Localized adiposity: Secondary | ICD-10-CM | POA: Diagnosis not present

## 2023-04-23 DIAGNOSIS — E559 Vitamin D deficiency, unspecified: Secondary | ICD-10-CM | POA: Diagnosis not present

## 2023-04-23 DIAGNOSIS — Z6832 Body mass index (BMI) 32.0-32.9, adult: Secondary | ICD-10-CM

## 2023-04-23 MED ORDER — VITAMIN D (ERGOCALCIFEROL) 1.25 MG (50000 UNIT) PO CAPS
50000.0000 [IU] | ORAL_CAPSULE | ORAL | 0 refills | Status: DC
Start: 2023-04-23 — End: 2023-06-25

## 2023-04-23 NOTE — Progress Notes (Signed)
Jacob Quinn, D.O.  ABFM, ABOM Specializing in Clinical Bariatric Medicine  Office located at: [address] [place], [place]  [address]     Assessment and Plan:   Medications Discontinued During This Encounter  Medication Reason   Vitamin D, Ergocalciferol, (DRISDOL) 1.25 MG (50000 UNIT) CAPS capsule Reorder     Meds ordered this encounter  Medications   Vitamin D, Ergocalciferol, (DRISDOL) 1.25 MG (50000 UNIT) CAPS capsule    Sig: Take 1 capsule (50,000 Units total) by mouth every 7 (seven) days.    Dispense:  4 capsule    Refill:  0     Essential hypertension Assessment: His blood pressure is stable. No concerns in this regard today. His HTN is being treated with Norvasc 10 mg daily. Denies any side effects.   Last 3 blood pressure readings in our office are as follows: BP Readings from Last 3 Encounters:  04/23/23 112/72  03/19/23 (!) 154/95  02/22/23 132/86   Plan: Continue with antihypertensive medication as prescribed by PCP.   Again reminded pt to avoid buying foods that are: processed, frozen, or prepackaged to avoid excess salt. We will continue to monitor closely alongside PCP/ specialists.  Pt reminded to also f/up with those individuals as instructed by them. We will continue to monitor symptoms as they relate to the his weight loss journey.    Vitamin D deficiency Assessment: Condition is improving, but not optimized. His Vitamin D defiency is being treated with Ergocalciferol 50K IU weekly and he is tolerating supplement well.   Lab Results  Component Value Date   VD25OH 33.4 01/25/2023   VD25OH 23.4 (L) 09/19/2022   Plan: Continue with ERGO at current dose. Will reorder this today.    Weight loss will likely improve availability of vitamin D, thus encouraged Kenry to continue with meal plan and their weight loss efforts to further improve this condition.  Thus, we will need to monitor levels regularly (every 3-4 mo on average) to keep  levels within normal limits and prevent over supplementation.   Visceral obesity Assessment: Condition is not optimized. Current visceral fat rating: 19.  The visceral fat rating should be < 12 in a male and < 10 in a male.    Plan: Counseling done on: Visceral adipose tissue is a hormonally active component of total body fat. This body composition phenotype is associated with medical disorders such as metabolic syndrome, cardiovascular disease and several malignancies including prostate, breast, and colorectal cancers. Goal: Lose 7-10% of weight via prudent nutritional plan and lifestyle changes.     TREATMENT PLAN FOR OBESITY: BMI 32.0-32.9,adult Current BMI 31.73 Morbid obesity (HCC)-start bmi 37.43 Assessment: Jacob WATERSON is here to discuss his progress with his obesity treatment plan along with follow-up of his obesity related diagnoses. See Medical Weight Management Flowsheet for complete bioelectrical impedance results.  Condition is not optimized. Biometric data collected today, was reviewed with patient.   Since last office visit on 03/19/23 patient's  Muscle mass has increased by 2.6 lb. Fat mass has increased by 1.2 lb. Total body water has increased by 2.4 lb.  Counseling done on how various foods will affect these numbers and how to maximize success  Total lbs lost to date: 42  Total weight loss percentage to date: 15.22   Plan: Continue Category 3 meal plan and journaling his intake, if he desires.   Behavioral Intervention Additional resources provided today: patient declined Evidence-based interventions for health behavior change were utilized  today including the discussion of self monitoring techniques, problem-solving barriers and SMART goal setting techniques.   Regarding patient's less desirable eating habits and patterns, we employed the technique of small changes.  Pt will specifically work on: lifting at the gym 3-4 days a wk and  doing cardio 45 minutes, 3  days a wk for next visit.    Recommended Physical Activity Goals  Jacob Quinn has been advised to slowly work up to 150 minutes of moderate intensity aerobic activity a week and strengthening exercises 2-3 times per week for cardiovascular health, weight loss maintenance and preservation of muscle mass.   He has agreed to Think about ways to increase daily physical activity and overcoming barriers to exercise  FOLLOW UP: Return in about 3 weeks (around 05/14/2023). He was informed of the importance of frequent follow up visits to maximize his success with intensive lifestyle modifications for his multiple health conditions.  Subjective:   Chief complaint: Obesity Jacob Quinn is here to discuss his progress with his obesity treatment plan. He is on the the Category 3 Plan and states he is following his eating plan approximately 30% of the time. He states he is walking 35 minutes 3  days per week.  Interval History:  Jacob Quinn is here for a follow up office visit.   Since last OV, Que has been doing well. He returned from a relaxing 2 wk vacation to [place] on Saturday. While on vacation, he endorses eating higher calorie foods/ alcoholic beverages. He endorses restarting his meal plan today and expresses interest in wanting to increase his physical activity.   Review of Systems:  Pertinent positives were addressed with patient today.  Reviewed by clinician on day of visit: allergies, medications, problem list, medical history, surgical history, family history, social history, and previous encounter notes.  Weight Summary and Biometrics   Weight Lost Since Last Visit: 0lb  Weight Gained Since Last Visit: 4lb    Vitals Temp: 97.8 F (36.6 C) BP: 112/72 Pulse Rate: (!) 59 SpO2: 94 %   Anthropometric Measurements Height: 6' (1.829 m) Weight: 234 lb (106.1 kg) BMI (Calculated): 31.73 Weight at Last Visit: 230lb Weight Lost Since Last Visit: 0lb Weight Gained Since Last Visit:  4lb Starting Weight: 276lb Total Weight Loss (lbs): 43 lb (19.5 kg)   Body Composition  Body Fat %: 32.5 % Fat Mass (lbs): 76.2 lbs Muscle Mass (lbs): 150.2 lbs Total Body Water (lbs): 113 lbs Visceral Fat Rating : 19   Other Clinical Data Fasting: no Labs: no Today's Visit #: 11 Starting Date: 09/19/22   Objective:   PHYSICAL EXAM: Blood pressure 112/72, pulse (!) 59, temperature 97.8 F (36.6 C), height 6' (1.829 m), weight 234 lb (106.1 kg), SpO2 94 %. Body mass index is 31.74 kg/m.  General: Well Developed, well nourished, and in no acute distress.  HEENT: Normocephalic, atraumatic Skin: Warm and dry, cap RF less 2 sec, good turgor Chest:  Normal excursion, shape, no gross abn Respiratory: speaking in full sentences, no conversational dyspnea NeuroM-Sk: Ambulates w/o assistance, moves * 4 Psych: A and O *3, insight good, mood-full  DIAGNOSTIC DATA REVIEWED:  BMET    Component Value Date/Time   NA 136 01/25/2023 1103   K 4.5 01/25/2023 1103   CL 98 01/25/2023 1103   CO2 23 01/25/2023 1103   GLUCOSE 91 01/25/2023 1103   GLUCOSE 112 (H) 11/11/2019 0849   BUN 11 01/25/2023 1103   CREATININE 1.13 01/25/2023 1103   CALCIUM 9.7 01/25/2023  1103   GFRNONAA >60 04/23/2018 0544   GFRAA >60 04/23/2018 0544   Lab Results  Component Value Date   HGBA1C 5.6 01/25/2023   HGBA1C 5.6 01/30/2015   Lab Results  Component Value Date   INSULIN 7.4 01/25/2023   INSULIN 26.5 (H) 09/19/2022   Lab Results  Component Value Date   TSH 1.790 09/19/2022   CBC    Component Value Date/Time   WBC 7.6 09/19/2022 1141   WBC 10.0 11/11/2019 0849   RBC 4.87 09/19/2022 1141   RBC 4.31 11/11/2019 0849   HGB 14.0 09/19/2022 1141   HCT 42.3 09/19/2022 1141   PLT 273 09/19/2022 1141   MCV 87 09/19/2022 1141   MCH 28.7 09/19/2022 1141   MCH 27.8 04/23/2018 0544   MCHC 33.1 09/19/2022 1141   MCHC 33.1 11/11/2019 0849   RDW 14.1 09/19/2022 1141   Iron Studies No results  found for: "IRON", "TIBC", "FERRITIN", "IRONPCTSAT" Lipid Panel     Component Value Date/Time   CHOL 183 01/25/2023 1103   TRIG 93 01/25/2023 1103   HDL 44 01/25/2023 1103   CHOLHDL 4 11/11/2019 0849   VLDL 22.0 11/11/2019 0849   LDLCALC 122 (H) 01/25/2023 1103   LDLDIRECT 110.4 02/19/2007 0819   Hepatic Function Panel     Component Value Date/Time   PROT 7.2 01/25/2023 1103   ALBUMIN 4.3 01/25/2023 1103   AST 16 01/25/2023 1103   ALT 14 01/25/2023 1103   ALKPHOS 97 01/25/2023 1103   BILITOT 0.7 01/25/2023 1103   BILIDIR 0.1 02/06/2017 0808      Component Value Date/Time   TSH 1.790 09/19/2022 1141   Nutritional Lab Results  Component Value Date   VD25OH 33.4 01/25/2023   VD25OH 23.4 (L) 09/19/2022    Attestations:   I, Special Puri, acting as a Stage manager for Thomasene Lot, DO., have compiled all relevant documentation for today's office visit on behalf of Thomasene Lot, DO, while in the presence of [employer], DO.  I have reviewed the above documentation for accuracy and completeness, and I agree with the above. Jacob Quinn, D.O.  The 21st Century Cures Act was signed into law in 2016 which includes the topic of electronic health records.  This provides immediate access to information in MyChart.  This includes consultation notes, operative notes, office notes, lab results and pathology reports.  If you have any questions about what you read please let us know at your next visit so we can discuss your concerns and take corrective action if need be.  We are right here with you.

## 2023-04-30 DIAGNOSIS — N401 Enlarged prostate with lower urinary tract symptoms: Secondary | ICD-10-CM | POA: Diagnosis not present

## 2023-04-30 DIAGNOSIS — R3912 Poor urinary stream: Secondary | ICD-10-CM | POA: Diagnosis not present

## 2023-04-30 DIAGNOSIS — N5201 Erectile dysfunction due to arterial insufficiency: Secondary | ICD-10-CM | POA: Diagnosis not present

## 2023-04-30 DIAGNOSIS — R351 Nocturia: Secondary | ICD-10-CM | POA: Diagnosis not present

## 2023-05-07 ENCOUNTER — Encounter (INDEPENDENT_AMBULATORY_CARE_PROVIDER_SITE_OTHER): Payer: Self-pay | Admitting: Family Medicine

## 2023-05-07 ENCOUNTER — Ambulatory Visit (INDEPENDENT_AMBULATORY_CARE_PROVIDER_SITE_OTHER): Payer: Medicare HMO | Admitting: Family Medicine

## 2023-05-07 VITALS — BP 143/81 | HR 64 | Temp 97.9°F | Ht 72.0 in | Wt 232.0 lb

## 2023-05-07 DIAGNOSIS — Z6831 Body mass index (BMI) 31.0-31.9, adult: Secondary | ICD-10-CM | POA: Diagnosis not present

## 2023-05-07 DIAGNOSIS — I10 Essential (primary) hypertension: Secondary | ICD-10-CM | POA: Diagnosis not present

## 2023-05-07 DIAGNOSIS — E559 Vitamin D deficiency, unspecified: Secondary | ICD-10-CM

## 2023-05-07 DIAGNOSIS — R001 Bradycardia, unspecified: Secondary | ICD-10-CM

## 2023-05-07 NOTE — Progress Notes (Signed)
[name], D.O.  ABFM, ABOM Specializing in Clinical Bariatric Medicine  Office located at: [address] [place], [place]  [address]     Assessment and Plan:   Vitamin D deficiency Assessment: Condition is Not at goal.. His vitamin D levels are not within the range of 50-80. He continues to take Ergocalciferol 50K IU weekly.   Lab Results  Component Value Date   VD25OH 33.4 01/25/2023   VD25OH 23.4 (L) 09/19/2022   Plan: He will continue ERGO as directed.  - weight loss will likely improve availability of vitamin D, thus encouraged Jacob Quinn to continue with meal plan and their weight loss efforts to further improve this condition.  Thus, we will need to monitor levels regularly (every 3-4 mo on average) to keep levels within normal limits and prevent over supplementation.   Symptomatic bradycardia Assessment: Condition is stable.  Since last OV patient endorses that he passed out and was sent to the hospital on 05/03/2023. Prior to this episode of passing out he was asymptomatic and denies having any irregular heart rate or chest pain. He is also asymptomatic today.  He currently has a small external heart monitor for the next 2 weeks per cardiology.   Upon review of the chart on 05/04/2023 hemoglobin was 12.6, hematocrit 38.8, and his serum creatinine is 1.2, BUN 16, glucose 107, sodium 139, potassium 3.9, calcium 9.0, gfr 66.   Plan: He will follow-up with a cardiologist and PCP after his heart monitoring is completed. I advised that he avoid any OTC medication, keep a symptom journal and bring it to his next follow-up appointments, continue to eat 3 meals a day and drink adequate water (at least half his weight in ounces). Continue activity level per [place] doctors.    Essential hypertension Assessment: Condition is slightly elevated at 143/81. He is completely asymptomatic. Denies any chest pain, shortness of breath, and palpitations. He endorses that his BP at home  has been stable in the 120's-130's at home. He is compliant and tolerant with Norvasc 10mg  once daily. He also reports not taking atenolol 25mg  daily for the past 2 months and has decided to stop the medication on his own( he did inform his PCP/specialist)  Last 3 blood pressure readings in our office are as follows: BP Readings from Last 3 Encounters:  05/07/23 (!) 143/81  04/23/23 112/72  03/19/23 (!) 154/95   Plan: Continue with BP medication as prescribed by PCP.    - Unless pre-existing renal or cardiopulmonary conditions exist which patient was told to limit their fluid intake by another provider, I recommended roughly one half of their weight in pounds, to be the approximate ounces of non-caloric, non-caffeinated beverages they should drink per day; including more if they are engaging in exercise.  Ambulatory blood pressure monitoring encouraged.  Reminded patient that if they ever feel poorly in any way, to check their blood pressure and pulse as well. We will continue to monitor closely alongside PCP/ specialists.  Pt reminded to also f/up with those individuals as instructed by them. We will continue to monitor symptoms as they relate to the his weight loss journey.   TREATMENT PLAN FOR OBESITY: Morbid obesity (HCC)-start bmi 37.43 BMI 31.0-31.9,adult - current BMI 31.46 Assessment:  Jacob Quinn is here to discuss his progress with his obesity treatment plan along with follow-up of his obesity related diagnoses. See Medical Weight Management Flowsheet for complete bioelectrical impedance results.  Condition is docourse: not optimized.  Since last office visit on 04/23/2023 patient's  Muscle mass has decreased by 2lb. Fat mass has increased by 0.4lb. Total body water has not changed.  Counseling done on how various foods will affect these numbers and how to maximize success  Total lbs lost to date: 44 lbs Total weight loss percentage to date: -15.94%   Plan: Continue  Category 3 meal plan and journaling his intake, if he desires.   Behavioral Intervention Additional resources provided today: Patient declined.  Evidence-based interventions for health behavior change were utilized today including the discussion of self monitoring techniques, problem-solving barriers and SMART goal setting techniques.   Regarding patient's less desirable eating habits and patterns, we employed the technique of small changes.  Pt will specifically work on: Drinking more water and eating more lean meats for next visit.    Recommended Physical Activity Goals  Jacob Quinn has been advised to slowly work up to 150 minutes of moderate intensity aerobic activity a week and strengthening exercises 2-3 times per week for cardiovascular health, weight loss maintenance and preservation of muscle mass.   He has agreed to Think about ways to increase daily physical activity and overcoming barriers to exercise  FOLLOW UP: Return in about 3 weeks (around 05/28/2023). He was informed of the importance of frequent follow up visits to maximize his success with intensive lifestyle modifications for his multiple health conditions.  Subjective:   Chief complaint: Obesity Jacob Quinn is here to discuss his progress with his obesity treatment plan. He is on the the Category 3 Plan and states he is following his eating plan approximately 50% of the time. He states he is walking 30-60 minutes 2 days per week.  Interval History:  Jacob Quinn is here for a follow up office visit. Since last OV patient informed me that he passed out and was sent to the hospital on 05/03/2023. He currently has a heart monitor for the next 2 weeks. He was referred to a cardiologist and will follow up with them later. He states that he has decreased his water intake before his hospital admission but has began drinking more. Since his episode he endorses following the category 3 meal plan and has been getting the recommended oz  of protein for breakfast, lunch and dinner.   Review of Systems:  Pertinent positives were addressed with patient today.  Reviewed by clinician on day of visit: allergies, medications, problem list, medical history, surgical history, family history, social history, and previous encounter notes.  Weight Summary and Biometrics   Weight Lost Since Last Visit: 2lb  Weight Gained Since Last Visit: 0lb   Vitals Temp: 97.9 F (36.6 C) BP: (!) 143/81 Pulse Rate: 64 SpO2: 99 %   Anthropometric Measurements Height: 6' (1.829 m) Weight: 232 lb (105.2 kg) BMI (Calculated): 31.46 Weight at Last Visit: 234lb Weight Lost Since Last Visit: 2lb Weight Gained Since Last Visit: 0lb Starting Weight: 276lb Total Weight Loss (lbs): 45 lb (20.4 kg)   Body Composition  Body Fat %: 33 % Fat Mass (lbs): 76.8 lbs Muscle Mass (lbs): 148.2 lbs Total Body Water (lbs): 113 lbs Visceral Fat Rating : 19   Other Clinical Data Fasting: no Labs: no Today's Visit #: 12 Starting Date: 09/19/22     Objective:   PHYSICAL EXAM: Blood pressure (!) 143/81, pulse 64, temperature 97.9 F (36.6 C), height 6' (1.829 m), weight 232 lb (105.2 kg), SpO2 99 %. Body mass index is 31.46 kg/m.  General: Well Developed, well nourished, and  in no acute distress.  HEENT: Normocephalic, atraumatic Skin: Warm and dry, cap RF less 2 sec, good turgor Chest:  Normal excursion, shape, no gross abn Respiratory: speaking in full sentences, no conversational dyspnea NeuroM-Sk: Ambulates w/o assistance, moves * 4 Psych: A and O *3, insight good, mood-full  DIAGNOSTIC DATA REVIEWED:  BMET    Component Value Date/Time   NA 136 01/25/2023 1103   K 4.5 01/25/2023 1103   CL 98 01/25/2023 1103   CO2 23 01/25/2023 1103   GLUCOSE 91 01/25/2023 1103   GLUCOSE 112 (H) 11/11/2019 0849   BUN 11 01/25/2023 1103   CREATININE 1.13 01/25/2023 1103   CALCIUM 9.7 01/25/2023 1103   GFRNONAA >60 04/23/2018 0544   GFRAA  >60 04/23/2018 0544   Lab Results  Component Value Date   HGBA1C 5.6 01/25/2023   HGBA1C 5.6 01/30/2015   Lab Results  Component Value Date   INSULIN 7.4 01/25/2023   INSULIN 26.5 (H) 09/19/2022   Lab Results  Component Value Date   TSH 1.790 09/19/2022   CBC    Component Value Date/Time   WBC 7.6 09/19/2022 1141   WBC 10.0 11/11/2019 0849   RBC 4.87 09/19/2022 1141   RBC 4.31 11/11/2019 0849   HGB 14.0 09/19/2022 1141   HCT 42.3 09/19/2022 1141   PLT 273 09/19/2022 1141   MCV 87 09/19/2022 1141   MCH 28.7 09/19/2022 1141   MCH 27.8 04/23/2018 0544   MCHC 33.1 09/19/2022 1141   MCHC 33.1 11/11/2019 0849   RDW 14.1 09/19/2022 1141   Iron Studies No results found for: "IRON", "TIBC", "FERRITIN", "IRONPCTSAT" Lipid Panel     Component Value Date/Time   CHOL 183 01/25/2023 1103   TRIG 93 01/25/2023 1103   HDL 44 01/25/2023 1103   CHOLHDL 4 11/11/2019 0849   VLDL 22.0 11/11/2019 0849   LDLCALC 122 (H) 01/25/2023 1103   LDLDIRECT 110.4 02/19/2007 0819   Hepatic Function Panel     Component Value Date/Time   PROT 7.2 01/25/2023 1103   ALBUMIN 4.3 01/25/2023 1103   AST 16 01/25/2023 1103   ALT 14 01/25/2023 1103   ALKPHOS 97 01/25/2023 1103   BILITOT 0.7 01/25/2023 1103   BILIDIR 0.1 02/06/2017 0808      Component Value Date/Time   TSH 1.790 09/19/2022 1141   Nutritional Lab Results  Component Value Date   VD25OH 33.4 01/25/2023   VD25OH 23.4 (L) 09/19/2022    Attestations:   This encounter took 40 total minutes of time including any pre-visit and post-visit time spent on this date of service, including taking a thorough history, reviewing any labs and/or imaging, reviewing prior notes, as well as documenting in the electronic health record on the date of service. Over 50% of that time was in direct face-to-face counseling and coordinating care for the patient today  I, Special Randolm Idol, acting as a Stage manager for Thomasene Lot, DO., have compiled all  relevant documentation for today's office visit on behalf of Thomasene Lot, DO, while in the presence of [employer], DO.  I have reviewed the above documentation for accuracy and completeness, and I agree with the above. [name], D.O.  The 21st Century Cures Act was signed into law in 2016 which includes the topic of electronic health records.  This provides immediate access to information in MyChart.  This includes consultation notes, operative notes, office notes, lab results and pathology reports.  If you have any questions about what you read  please let us know at your next visit so we can discuss your concerns and take corrective action if need be.  We are right here with you.

## 2023-05-28 ENCOUNTER — Encounter (INDEPENDENT_AMBULATORY_CARE_PROVIDER_SITE_OTHER): Payer: Self-pay

## 2023-05-28 ENCOUNTER — Ambulatory Visit (INDEPENDENT_AMBULATORY_CARE_PROVIDER_SITE_OTHER): Payer: Medicare HMO | Admitting: Family Medicine

## 2023-06-10 ENCOUNTER — Other Ambulatory Visit: Payer: Self-pay | Admitting: Adult Health

## 2023-06-10 DIAGNOSIS — N401 Enlarged prostate with lower urinary tract symptoms: Secondary | ICD-10-CM

## 2023-06-25 ENCOUNTER — Ambulatory Visit (INDEPENDENT_AMBULATORY_CARE_PROVIDER_SITE_OTHER): Payer: Medicare HMO | Admitting: Family Medicine

## 2023-06-25 ENCOUNTER — Encounter (INDEPENDENT_AMBULATORY_CARE_PROVIDER_SITE_OTHER): Payer: Self-pay | Admitting: Family Medicine

## 2023-06-25 VITALS — BP 131/80 | HR 62 | Temp 98.1°F | Ht 72.0 in | Wt 232.0 lb

## 2023-06-25 DIAGNOSIS — R7303 Prediabetes: Secondary | ICD-10-CM | POA: Diagnosis not present

## 2023-06-25 DIAGNOSIS — E559 Vitamin D deficiency, unspecified: Secondary | ICD-10-CM

## 2023-06-25 DIAGNOSIS — Z6831 Body mass index (BMI) 31.0-31.9, adult: Secondary | ICD-10-CM

## 2023-06-25 DIAGNOSIS — I10 Essential (primary) hypertension: Secondary | ICD-10-CM | POA: Diagnosis not present

## 2023-06-25 MED ORDER — VITAMIN D (ERGOCALCIFEROL) 1.25 MG (50000 UNIT) PO CAPS
50000.0000 [IU] | ORAL_CAPSULE | ORAL | 0 refills | Status: DC
Start: 2023-06-25 — End: 2023-08-28

## 2023-06-25 NOTE — Progress Notes (Signed)
[name], D.O.  ABFM, ABOM Specializing in Clinical Bariatric Medicine  Office located at: [address] [place], [place]  [address]     Assessment and Plan:  Review labs with pt next OV.  Orders Placed This Encounter  Procedures   BASIC METABOLIC PANEL WITH GFR   VITAMIN D 25 Hydroxy (Vit-D Deficiency, Fractures)   Hemoglobin A1c   Medications Discontinued During This Encounter  Medication Reason   Vitamin D, Ergocalciferol, (DRISDOL) 1.25 MG (50000 UNIT) CAPS capsule Reorder   Meds ordered this encounter  Medications   Vitamin D, Ergocalciferol, (DRISDOL) 1.25 MG (50000 UNIT) CAPS capsule    Sig: Take 1 capsule (50,000 Units total) by mouth every 7 (seven) days.    Dispense:  4 capsule    Refill:  0    Vitamin D deficiency Assessment: Condition is Not at goal. His vitamin D level is sub-optimal. He endorses taking his vitamin D supplement consistently for the last 2 months.  Lab Results  Component Value Date   VD25OH 33.4 01/25/2023   VD25OH 23.4 (L) 09/19/2022   Plan:  Continue with Ergocalciferol 50K IU weekly. I will refill today.   - ideal vitamin D levels reviewed with patient    - I informed pt that weight loss will likely improve availability of vitamin D, thus encouraged Jacob Quinn to continue with meal plan and their weight loss efforts to further improve this condition. Thus, we will recheck his vitamin D level today and review next OV.    Prediabetes Assessment: Condition is Not at goal. This is diet/exercise controlled. He endorses having hunger and cravings, especially sweet cravings at night. Pt notes that he is not meeting his protein goals.  Lab Results  Component Value Date   HGBA1C 5.6 01/25/2023   HGBA1C 6.1 (H) 09/19/2022   HGBA1C 5.7 (H) 04/22/2018   INSULIN 7.4 01/25/2023   INSULIN 26.5 (H) 09/19/2022    Plan: - Pt declines wanting to start any weight loss medication at this time.    - I explained role of simple carbs and insulin  levels on hunger and cravings. He will continue to increase his protein intake to help curb his hunger cravings.   - Anticipatory guidance given.    Jacob Quinn will continue to work on weight loss, exercise, via their meal plan we devised to help decrease the risk of progressing to diabetes.   - We will recheck his levels today and review next OV.    Essential hypertension Assessment: Condition is Controlled. Pt BP is improved since last OV as his level last time was elevated. This is controlled with Norvasc. Pt tolerates this well and denies any adverse side effects. He has discontinue Atenolol about 3 months ago.  Last 3 blood pressure readings in our office are as follows: BP Readings from Last 3 Encounters:  06/25/23 131/80  05/07/23 (!) 143/81  04/23/23 112/72    Plan: - Continue Norvasc as directed.   Jacob Quinn BP is 131/80 at goal today.   - Lifestyle changes such as following our low salt, heart healthy meal plan and engaging in a regular exercise program discussed   - Ambulatory blood pressure monitoring encouraged.    - We will continue to monitor closely alongside PCP/ specialists as they relate to the his weight loss journey.  Pt reminded to also f/up with those individuals as instructed by them.    TREATMENT PLAN FOR OBESITY: BMI 31.0-31.9,adult - current BMI 31.46 Morbid  obesity (HCC)-start bmi 37.43 Assessment:  Jacob Quinn is here to discuss his progress with his obesity treatment plan along with follow-up of his obesity related diagnoses. See Medical Weight Management Flowsheet for complete bioelectrical impedance results.  Condition is not optimized. Biometric data collected today, was reviewed with patient.   Since last office visit on 05/07/2023 patient's  Muscle mass is the same. Fat mass has increased by 0.2lb. Total body water has decreased by 0.2lb.  Counseling done on how various foods will affect these numbers and how to maximize  success  Total lbs lost to date: 44 Total weight loss percentage to date: 15.94%  Plan: - Kwamaine will work on healthier eating habits and to Continue Category 3 meal plan  best they can.    Behavioral Intervention Additional resources provided today: category 3 meal plan information and Provided hand out on proper plate portions, healthy complex carbs and sources of protein as well as generaly healthy eating concepts.  Evidence-based interventions for health behavior change were utilized today including the discussion of self monitoring techniques, problem-solving barriers and SMART goal setting techniques.   Regarding patient's less desirable eating habits and patterns, we employed the technique of small changes.  Pt will specifically work on: Measuring his protein and eating his proteins every day for next visit.     He has agreed to Think about ways to increase daily physical activity and overcoming barriers to exercise   FOLLOW UP: Return in about 4 weeks (around 07/23/2023).  He was informed of the importance of frequent follow up visits to maximize his success with intensive lifestyle modifications for his multiple health conditions.Jacob Quinn is aware that we will review all of his lab results at our next visit together in person.  He is aware that if anything is critical/ life threatening with the results, we will be contacting him via MyChart or by my CMA will be calling them prior to the office visit to discuss acute management.    Subjective:   Chief complaint: Obesity Jacob Quinn is here to discuss his progress with his obesity treatment plan. He is on the the Category 3 Plan and states he is following his eating plan approximately 25% of the time. He states he is not exercising.  Interval History:  Jacob Quinn is here for a follow up office visit.     Since last office visit he has been well but has had back pain that puts him in the bed. He recently had a spinal  injection. He endorses that it is hard to stay away from sweets while on his prescribed meal plan. He feels as he might not be hitting his protein goals. He drinks about 6-7 bottles of water daily.    Review of Systems:  Pertinent positives were addressed with patient today.  Reviewed by clinician on day of visit: allergies, medications, problem list, medical history, surgical history, family history, social history, and previous encounter notes.  Weight Summary and Biometrics   Weight Lost Since Last Visit: 0lb  Weight Gained Since Last Visit: 0lb   Vitals Temp: 98.1 F (36.7 C) BP: 131/80 Pulse Rate: 62 SpO2: 97 %   Anthropometric Measurements Height: 6' (1.829 m) Weight: 232 lb (105.2 kg) BMI (Calculated): 31.46 Weight at Last Visit: 232lb Weight Lost Since Last Visit: 0lb Weight Gained Since Last Visit: 0lb Starting Weight: 276lb Total Weight Loss (lbs): 44 lb (20 kg)   Body Composition  Body Fat %: 33.1 % Fat Mass (  lbs): 77 lbs Muscle Mass (lbs): 148.2 lbs Total Body Water (lbs): 112.8 lbs Visceral Fat Rating : 19   Other Clinical Data Fasting: no Labs: no Today's Visit #: 13 Starting Date: 09/19/22     Objective:   PHYSICAL EXAM: Blood pressure 131/80, pulse 62, temperature 98.1 F (36.7 C), height 6' (1.829 m), weight 232 lb (105.2 kg), SpO2 97%. Body mass index is 31.46 kg/m.  General: Well Developed, well nourished, and in no acute distress.  HEENT: Normocephalic, atraumatic Skin: Warm and dry, cap RF less 2 sec, good turgor Chest:  Normal excursion, shape, no gross abn Respiratory: speaking in full sentences, no conversational dyspnea NeuroM-Sk: Ambulates w/o assistance, moves * 4 Psych: A and O *3, insight good, mood-full  DIAGNOSTIC DATA REVIEWED:  BMET    Component Value Date/Time   NA 136 01/25/2023 1103   K 4.5 01/25/2023 1103   CL 98 01/25/2023 1103   CO2 23 01/25/2023 1103   GLUCOSE 91 01/25/2023 1103   GLUCOSE 112 (H)  11/11/2019 0849   BUN 11 01/25/2023 1103   CREATININE 1.13 01/25/2023 1103   CALCIUM 9.7 01/25/2023 1103   GFRNONAA >60 04/23/2018 0544   GFRAA >60 04/23/2018 0544   Lab Results  Component Value Date   HGBA1C 5.6 01/25/2023   HGBA1C 5.6 01/30/2015   Lab Results  Component Value Date   INSULIN 7.4 01/25/2023   INSULIN 26.5 (H) 09/19/2022   Lab Results  Component Value Date   TSH 1.790 09/19/2022   CBC    Component Value Date/Time   WBC 7.6 09/19/2022 1141   WBC 10.0 11/11/2019 0849   RBC 4.87 09/19/2022 1141   RBC 4.31 11/11/2019 0849   HGB 14.0 09/19/2022 1141   HCT 42.3 09/19/2022 1141   PLT 273 09/19/2022 1141   MCV 87 09/19/2022 1141   MCH 28.7 09/19/2022 1141   MCH 27.8 04/23/2018 0544   MCHC 33.1 09/19/2022 1141   MCHC 33.1 11/11/2019 0849   RDW 14.1 09/19/2022 1141   Iron Studies No results found for: "IRON", "TIBC", "FERRITIN", "IRONPCTSAT" Lipid Panel     Component Value Date/Time   CHOL 183 01/25/2023 1103   TRIG 93 01/25/2023 1103   HDL 44 01/25/2023 1103   CHOLHDL 4 11/11/2019 0849   VLDL 22.0 11/11/2019 0849   LDLCALC 122 (H) 01/25/2023 1103   LDLDIRECT 110.4 02/19/2007 0819   Hepatic Function Panel     Component Value Date/Time   PROT 7.2 01/25/2023 1103   ALBUMIN 4.3 01/25/2023 1103   AST 16 01/25/2023 1103   ALT 14 01/25/2023 1103   ALKPHOS 97 01/25/2023 1103   BILITOT 0.7 01/25/2023 1103   BILIDIR 0.1 02/06/2017 0808      Component Value Date/Time   TSH 1.790 09/19/2022 1141   Nutritional Lab Results  Component Value Date   VD25OH 33.4 01/25/2023   VD25OH 23.4 (L) 09/19/2022    Attestations:   I, Clinical biochemist, acting as a Stage manager for [employer], DO., have compiled all relevant documentation for today's office visit on behalf of Thomasene Lot, DO, while in the presence of [employer], DO.  I have reviewed the above documentation for accuracy and completeness, and I agree with the above. [name], D.O.  The 21st Century Cures Act was signed into law in 2016 which includes the topic of electronic health records.  This provides immediate access to information in MyChart.  This includes consultation notes, operative notes, office notes, lab results  and pathology reports.  If you have any questions about what you read please let us know at your next visit so we can discuss your concerns and take corrective action if need be.  We are right here with you.

## 2023-06-26 LAB — HEMOGLOBIN A1C
Est. average glucose Bld gHb Est-mCnc: 114 mg/dL
Hgb A1c MFr Bld: 5.6 % (ref 4.8–5.6)

## 2023-06-26 LAB — VITAMIN D 25 HYDROXY (VIT D DEFICIENCY, FRACTURES): Vit D, 25-Hydroxy: 26.6 ng/mL — ABNORMAL LOW (ref 30.0–100.0)

## 2023-07-12 ENCOUNTER — Other Ambulatory Visit: Payer: Self-pay | Admitting: Adult Health

## 2023-07-23 ENCOUNTER — Ambulatory Visit (INDEPENDENT_AMBULATORY_CARE_PROVIDER_SITE_OTHER): Payer: Medicare HMO | Admitting: Family Medicine

## 2023-08-28 ENCOUNTER — Encounter (INDEPENDENT_AMBULATORY_CARE_PROVIDER_SITE_OTHER): Payer: Self-pay | Admitting: Family Medicine

## 2023-08-28 ENCOUNTER — Ambulatory Visit (INDEPENDENT_AMBULATORY_CARE_PROVIDER_SITE_OTHER): Payer: Medicare HMO | Admitting: Family Medicine

## 2023-08-28 VITALS — BP 122/74 | HR 57 | Temp 98.1°F | Ht 72.0 in | Wt 241.0 lb

## 2023-08-28 DIAGNOSIS — E559 Vitamin D deficiency, unspecified: Secondary | ICD-10-CM | POA: Diagnosis not present

## 2023-08-28 DIAGNOSIS — R7303 Prediabetes: Secondary | ICD-10-CM

## 2023-08-28 DIAGNOSIS — E538 Deficiency of other specified B group vitamins: Secondary | ICD-10-CM

## 2023-08-28 DIAGNOSIS — Z6831 Body mass index (BMI) 31.0-31.9, adult: Secondary | ICD-10-CM

## 2023-08-28 DIAGNOSIS — E65 Localized adiposity: Secondary | ICD-10-CM | POA: Diagnosis not present

## 2023-08-28 DIAGNOSIS — Z6832 Body mass index (BMI) 32.0-32.9, adult: Secondary | ICD-10-CM | POA: Diagnosis not present

## 2023-08-28 MED ORDER — CYANOCOBALAMIN 500 MCG PO TABS: 500.0000 ug | ORAL_TABLET | Freq: Every day | ORAL | Status: DC

## 2023-08-28 MED ORDER — METFORMIN HCL 500 MG PO TABS: ORAL_TABLET | ORAL | 0 refills | Status: DC

## 2023-08-28 MED ORDER — VITAMIN D (ERGOCALCIFEROL) 1.25 MG (50000 UNIT) PO CAPS
50000.0000 [IU] | ORAL_CAPSULE | ORAL | 0 refills | Status: DC
Start: 2023-08-28 — End: 2023-11-20

## 2023-08-28 NOTE — Progress Notes (Signed)
Jacob Quinn, D.O.  ABFM, ABOM Specializing in Clinical Bariatric Medicine  Office located at: [address] [place], [place]  [address]     Assessment and Plan:   Medications Discontinued During This Encounter  Medication Reason   Vitamin D, Ergocalciferol, (DRISDOL) 1.25 MG (50000 UNIT) CAPS capsule Reorder    Meds ordered this encounter  Medications   Vitamin D, Ergocalciferol, (DRISDOL) 1.25 MG (50000 UNIT) CAPS capsule    Sig: Take 1 capsule (50,000 Units total) by mouth every 7 (seven) days.    Dispense:  4 capsule    Refill:  0   cyanocobalamin (VITAMIN B12) 500 MCG tablet    Sig: Take 1 tablet (500 mcg total) by mouth daily.   metFORMIN (GLUCOPHAGE) 500 MG tablet    Sig: 1 po with lunch daily    Dispense:  30 tablet    Refill:  0    30 d supply;  ** OV for RF **   Do not send RF request    Vitamin D deficiency Assessment: Condition is Worsening.. Pt endorses not taking this regularly and skipping doses. This has allowed his vitamin D level to drop from 33.4 to 26.6 as of 06/25/2023. Lab Results  Component Value Date   VD25OH 26.6 (L) 06/25/2023   VD25OH 33.4 01/25/2023   VD25OH 23.4 (L) 09/19/2022   Plan: - Continue with Ergocalciferol 50K IU weekly more consistently. I will refill.   - Informed patient this may be a lifelong thing, and he was encouraged to continue to take the medicine until told otherwise.     - weight loss will likely improve availability of vitamin D, thus encouraged Jacob Quinn to continue with meal plan and their weight loss efforts to further improve this condition.  With winter approaching his vitamin D levels are prone to drop so we will need to monitor levels regularly to keep levels within normal limits and prevent over supplementation.  Labs were reviewed with patient today and education provided on them. We discussed how the foods patient eats may influence these laboratory findings.  All of the patient's questions about them were  answered    Vitamin B 12 deficiency Assessment: Condition is Not optimized. Pt has a hx of B12 deficiency and does not take vitamin B12 supplements.  Lab Results  Component Value Date   VITAMINB12 511 09/19/2022   Plan: - I informed pt that Metformin can cause his b12  level to go lower and to Begin taking OTC vitamin B-12 supplement once daily.   Labs were reviewed with patient today and education provided on them. We discussed how the foods patient eats may influence these laboratory findings.  All of the patient's questions about them were answered    Prediabetes Assessment: Condition is Not at goal.. This is diet/exercise controlled. Pt A1c has been stable at 5.6. His insulin was not rechecked. Pt endorses having a lot of hunger and cravings since last OV.  Lab Results  Component Value Date   HGBA1C 5.6 06/25/2023   HGBA1C 5.6 01/25/2023   HGBA1C 6.1 (H) 09/19/2022   INSULIN 7.4 01/25/2023   INSULIN 26.5 (H) 09/19/2022   Lab Results  Component Value Date   CREATININE 1.13 01/25/2023   BUN 11 01/25/2023   NA 136 01/25/2023   K 4.5 01/25/2023   CL 98 01/25/2023   CO2 23 01/25/2023      Component Value Date/Time   PROT 7.2 01/25/2023 1103   ALBUMIN 4.3 01/25/2023 1103  AST 16 01/25/2023 1103   ALT 14 01/25/2023 1103   ALKPHOS 97 01/25/2023 1103   BILITOT 0.7 01/25/2023 1103   BILIDIR 0.1 02/06/2017 0808    Plan: - Begin Metformin 500mg  once daily with a meal.    - In addition, we discussed the risks and benefits of various medication options which can help [place] in the management of this disease process as well as with weight loss.  Will consider starting one of these meds in future as we will focus on prudent nutritional plan at this time.   - Reminded pt to not take Advil or Aleve due to harm in his kidney functions and for him to replace this with Tylenol.   - In the furture if he still has cravings we wil consider adding Topiramate to his regiment.   - Continue  to decrease simple carbs/ sugars; increase fiber and proteins -> follow his meal plan.    - Handouts provided at pt's request after education provided.  All concerns/questions addressed.    - Anticipatory guidance given.    - We will recheck kidney function in approximately 6-8 weeks or as deemed appropriate.   Labs were reviewed with patient today and education provided on them. We discussed how the foods patient eats may influence these laboratory findings.  All of the patient's questions about them were answered    TREATMENT PLAN FOR OBESITY: BMI 31.0-31.9,adult - current BMI 32.68 Morbid obesity (HCC)-start bmi 37.43 Assessment:  Jacob Quinn is here to discuss his progress with his obesity treatment plan along with follow-up of his obesity related diagnoses. See Medical Weight Management Flowsheet for complete bioelectrical impedance results.  Condition is not optimized. Biometric data collected today, was reviewed with patient.   Since last office visit on 06/25/2023 patient's  Muscle mass has increased by 3lb. Fat mass has increased by 5.2lb. Total body water has increased by 8.8lb.  Counseling done on how various foods will affect these numbers and how to maximize success  Total lbs lost to date: 35 Total weight loss percentage to date: 12.68%   Plan: - Continue to adhere to the category 3 nutritional plan.   Behavioral Intervention Additional resources provided today:  Metformin handout Evidence-based interventions for health behavior change were utilized today including the discussion of self monitoring techniques, problem-solving barriers and SMART goal setting techniques.   Regarding patient's less desirable eating habits and patterns, we employed the technique of small changes.  Pt will specifically work on: Get back on plan for next visit.     He has agreed to Think about enjoyable ways to increase daily physical activity and overcoming barriers to exercise and  Increase physical activity in their day and reduce sedentary time (increase NEAT).   FOLLOW UP: Return in about 4 weeks (around 09/25/2023).  He was informed of the importance of frequent follow up visits to maximize his success with intensive lifestyle modifications for his multiple health conditions.  Subjective:   Chief complaint: Obesity Jacob Quinn is here to discuss his progress with his obesity treatment plan. He is on the the Category 3 Plan and states he is following his eating plan approximately 25% of the time. He states he is not exercising.  Interval History:  Jacob Quinn is here for a follow up office visit.     Since last office visit:  Pt was last seen in August and informed me that he had a flare up with a pinched nerve in his back. He  received injections for this and was placed on bed rest. He notes eating off plan at home such as high fat saturated meats and foods and is not meeting his protein goals.   We reviewed his meal plan and all questions were answered.   Review of Systems:  Pertinent positives were addressed with patient today.  Reviewed by clinician on day of visit: allergies, medications, problem list, medical history, surgical history, family history, social history, and previous encounter notes.  Weight Summary and Biometrics   Weight Lost Since Last Visit: 0lb  Weight Gained Since Last Visit: 9lb   Vitals Temp: 98.1 F (36.7 C) BP: 122/74 Pulse Rate: (!) 57 SpO2: 97 %   Anthropometric Measurements Height: 6' (1.829 m) Weight: 241 lb (109.3 kg) BMI (Calculated): 32.68 Weight at Last Visit: 232lb Weight Lost Since Last Visit: 0lb Weight Gained Since Last Visit: 9lb Starting Weight: 276lb Total Weight Loss (lbs): 35 lb (15.9 kg)   Body Composition  Body Fat %: 34.1 % Fat Mass (lbs): 82.2 lbs Muscle Mass (lbs): 151.2 lbs Total Body Water (lbs): 121.6 lbs Visceral Fat Rating : 20   Other Clinical Data Fasting: no Labs: no Today's  Visit #: 14 Starting Date: 09/19/22     Objective:   PHYSICAL EXAM: Blood pressure 122/74, pulse (!) 57, temperature 98.1 F (36.7 C), height 6' (1.829 m), weight 241 lb (109.3 kg), SpO2 97%. Body mass index is 32.69 kg/m.  General: Well Developed, well nourished, and in no acute distress.  HEENT: Normocephalic, atraumatic Skin: Warm and dry, cap RF less 2 sec, good turgor Chest:  Normal excursion, shape, no gross abn Respiratory: speaking in full sentences, no conversational dyspnea NeuroM-Sk: Ambulates w/o assistance, moves * 4 Psych: A and O *3, insight good, mood-full  DIAGNOSTIC DATA REVIEWED:  BMET    Component Value Date/Time   NA 136 01/25/2023 1103   K 4.5 01/25/2023 1103   CL 98 01/25/2023 1103   CO2 23 01/25/2023 1103   GLUCOSE 91 01/25/2023 1103   GLUCOSE 112 (H) 11/11/2019 0849   BUN 11 01/25/2023 1103   CREATININE 1.13 01/25/2023 1103   CALCIUM 9.7 01/25/2023 1103   GFRNONAA >60 04/23/2018 0544   GFRAA >60 04/23/2018 0544   Lab Results  Component Value Date   HGBA1C 5.6 06/25/2023   HGBA1C 5.6 01/30/2015   Lab Results  Component Value Date   INSULIN 7.4 01/25/2023   INSULIN 26.5 (H) 09/19/2022   Lab Results  Component Value Date   TSH 1.790 09/19/2022   CBC    Component Value Date/Time   WBC 7.6 09/19/2022 1141   WBC 10.0 11/11/2019 0849   RBC 4.87 09/19/2022 1141   RBC 4.31 11/11/2019 0849   HGB 14.0 09/19/2022 1141   HCT 42.3 09/19/2022 1141   PLT 273 09/19/2022 1141   MCV 87 09/19/2022 1141   MCH 28.7 09/19/2022 1141   MCH 27.8 04/23/2018 0544   MCHC 33.1 09/19/2022 1141   MCHC 33.1 11/11/2019 0849   RDW 14.1 09/19/2022 1141   Iron Studies No results found for: "IRON", "TIBC", "FERRITIN", "IRONPCTSAT" Lipid Panel     Component Value Date/Time   CHOL 183 01/25/2023 1103   TRIG 93 01/25/2023 1103   HDL 44 01/25/2023 1103   CHOLHDL 4 11/11/2019 0849   VLDL 22.0 11/11/2019 0849   LDLCALC 122 (H) 01/25/2023 1103   LDLDIRECT  110.4 02/19/2007 0819   Hepatic Function Panel     Component Value Date/Time   PROT  7.2 01/25/2023 1103   ALBUMIN 4.3 01/25/2023 1103   AST 16 01/25/2023 1103   ALT 14 01/25/2023 1103   ALKPHOS 97 01/25/2023 1103   BILITOT 0.7 01/25/2023 1103   BILIDIR 0.1 02/06/2017 0808      Component Value Date/Time   TSH 1.790 09/19/2022 1141   Nutritional Lab Results  Component Value Date   VD25OH 26.6 (L) 06/25/2023   VD25OH 33.4 01/25/2023   VD25OH 23.4 (L) 09/19/2022    Attestations:   I, Clinical biochemist, acting as a Stage manager for [employer], DO., have compiled all relevant documentation for today's office visit on behalf of Thomasene Lot, DO, while in the presence of [employer], DO.  I have reviewed the above documentation for accuracy and completeness, and I agree with the above. Jacob Quinn, D.O.  The 21st Century Cures Act was signed into law in 2016 which includes the topic of electronic health records.  This provides immediate access to information in MyChart.  This includes consultation notes, operative notes, office notes, lab results and pathology reports.  If you have any questions about what you read please let us know at your next visit so we can discuss your concerns and take corrective action if need be.  We are right here with you.

## 2023-09-14 ENCOUNTER — Ambulatory Visit (INDEPENDENT_AMBULATORY_CARE_PROVIDER_SITE_OTHER): Payer: Medicare HMO | Admitting: Physical Medicine and Rehabilitation

## 2023-09-14 ENCOUNTER — Encounter: Payer: Self-pay | Admitting: Physical Medicine and Rehabilitation

## 2023-09-14 VITALS — BP 132/80 | HR 66

## 2023-09-14 DIAGNOSIS — M5416 Radiculopathy, lumbar region: Secondary | ICD-10-CM | POA: Diagnosis not present

## 2023-09-14 DIAGNOSIS — M5442 Lumbago with sciatica, left side: Secondary | ICD-10-CM

## 2023-09-14 DIAGNOSIS — G8929 Other chronic pain: Secondary | ICD-10-CM

## 2023-09-14 DIAGNOSIS — M48062 Spinal stenosis, lumbar region with neurogenic claudication: Secondary | ICD-10-CM

## 2023-09-14 NOTE — Progress Notes (Unsigned)
Functional Pain Scale - descriptive words and definitions  Intense (8)    Cannot complete any ADLs without much assistance/cannot concentrate/conversation is difficult/unable to sleep and unable to use distraction. Severe range order  Average Pain 8  Patient having LBP, it doesn't radiate to legs. Pain can get severe.

## 2023-09-14 NOTE — Progress Notes (Unsigned)
Jacob Quinn - 67 y.o. male MRN [id number]  Date of birth: [birth year]  Office Visit Note: Visit Date: 09/14/2023 PCP: Shirline Frees, NP Referred by: Shirline Frees, NP  Subjective: Chief Complaint  Patient presents with   Lower Back - Pain   HPI: Jacob Quinn is a 67 y.o. male who comes in today per the request of Dr. Dorene Grebe for evaluation of chronic, worsening and severe left sided lower back pain radiating to buttock. Pain ongoing for several years, worsened over the past few weeks. His pain worsens with movement and activity. Severe pain with walking. He describes pain as sore and aching sensation, currently rates as 5 out of 10. Some relief pain of pain with home exercise regimen, rest and use of medications. Lumbar MRI imaging from 2016 exhibits moderate multi factorial spinal canal stenosis at L3-L4 and posterior disc herniation on the left at L4-L5. Patient underwent left L3 and L4 transforaminal epidural steroid injection in our office on 12/06/2021, he reports greater than 80% relief of pain with this procedure for 4 months. Also reports increased functional ability post injection. Patient is managed from orthopedic standpoint by Dr. Dorene Grebe. He denies focal weakness, numbness and tingling. No recent trauma or falls.      Review of Systems  Musculoskeletal:  Positive for back pain.  Neurological:  Negative for tingling, sensory change, focal weakness and weakness.  All other systems reviewed and are negative.  Otherwise per HPI.  Assessment & Plan: Visit Diagnoses:    ICD-10-CM   1. Chronic left-sided low back pain with left-sided sciatica  M54.42    G89.29     2. Lumbar radiculopathy  M54.16     3. Spinal stenosis of lumbar region with neurogenic claudication  M48.062        Plan: Findings:  Chronic, worsening and severe left sided lower back pain radiating to buttock. Patient continues to have severe pain despite good conservative therapies such as  formal physical therapy, home exercise regimen, rest and use of medications. Patients clinical presentation and exam are consistent with neurogenic claudication as a result of spinal canal stenosis. MRI imaging from 2016 does show moderate spinal canal stenosis at level of L3-L4, there is some concern central narrowing has worsened over the years. We discussed treatment plan in detail today, next step is to perform diagnostic and hopefully therapeutic left L3 and L4 transforaminal epidural steroid injection under fluoroscopic guidance. If good relief of pain with injection we can repeat this procedure infrequently as needed. Should his pain persist would recommend obtaining new lumbar MRI imaging. Patient has no questions at this time. We will get him back as soon as possible for injection. No red flag symptoms noted upon exam today.     Meds & Orders: No orders of the defined types were placed in this encounter.  No orders of the defined types were placed in this encounter.   Follow-up: Return for Left L3 and L4 transforaminal epidural steroid injection.   Procedures: No procedures performed      Clinical History: INDICATION: low back pain , eval for hnp or spinal stenosis . Tingling and weakness for 5 months.  STUDY: MRI of the lumbar spine without intravenous contrast performed on 06/03/2015 6:30 PM. No comparison available.  TECHNIQUE: Multiplanar, multisequence MR imaging obtained through the lumbar spine without contrast on 06/03/2015 6:30 PM.  FINDINGS: #  Osseous structures: Endplate degenerative changes with anterior lateral osteophytosis at L2-L3 to L4-L5. There is reactive  marrow edema at L3-L4 and more so at L4-L5. Vertebral body heights are well-maintained without evidence of an acute fracture. #  Alignment:Alignment maintained. #  Conus medullaris/cauda equina: Spinal cord signal is normal and terminates at L1.  #  Lower thoracic spine: Mild degenerative disc disease without any  significant spinal canal or neural foraminal compromise. This is viewed on sagittal images only.  #  T12-L1: Mild degenerative disc disease without any significant disc herniation, spinal canal or neural foraminal compromise. This is viewed on the sagittal images only. #  L1-L2: Mild degenerative disc disease with a minimal disc bulge. Spinal canal is patent. Foramen are patent. This is viewed on the sagittal images only. #  L2-L3: Degenerative disc disease with mild loss of disc height and a disc bulge. Vacuum disc phenomenon appreciated. There is ligamentum flavum flavum redundancy with mild facet hypertrophy on the right. This produces mild spinal canal and lateral recess stenosis. There is mild bilateral neural foraminal compromise. #  L3-L4: Degenerative disc disease with moderate loss of disc height and a disc bulge. Vacuum disc phenomena. There is bilateral facet hypertrophy with ligamentum flavum redundancy. This produces moderate spinal canal and lateral recess stenosis. There is moderate left and mild right neural foraminal stenosis. #  L4-L5: Degenerative disc disease with mild loss of disc height. Vacuum disc phenomena. There is a posterior disc herniation that is more pronounced on the left. Bilateral facet hypertrophy with ligamentum flavum redundancy. There is a small left facet  joint effusion. Mild spinal canal and lateral recess stenosis. There is severe left and mild right neural foraminal stenosis. #  L5-S1: Degenerative disc disease with bilateral facet hypertrophy. No significant disc herniation, spinal canal or neural foraminal compromise.  #  Paraspinal tissues: Unremarkable.  #  Additional comments: None.   IMPRESSION: 1.  Degenerative disc disease and facet arthrosis as described above. This is most notable for severe left and mild spinal canal, lateral recess and right neural foraminal stenosis at L4-L5. 2.  Moderate spinal canal and lateral recess stenosis with moderate  left and mild right neural foraminal stenosis at L3-L4.  Electronically Signed by Ardyth Man Exam End: -  Specimen Collected: 06/03/15 17:30 Last Resulted: 06/04/15 08:43   He reports that he has never smoked. He has never used smokeless tobacco.  Recent Labs    09/19/22 1141 01/25/23 1103 06/25/23 1228  HGBA1C 6.1* 5.6 5.6    Objective:  VS:  HT:    WT:   BMI:     BP:132/80  HR:66bpm  TEMP: ( )  RESP:  Physical Exam Vitals and nursing note reviewed.  HENT:     Head: Normocephalic and atraumatic.     Right Ear: External ear normal.     Left Ear: External ear normal.     Nose: Nose normal.     Mouth/Throat:     Mouth: Mucous membranes are moist.  Eyes:     Extraocular Movements: Extraocular movements intact.  Cardiovascular:     Rate and Rhythm: Normal rate.     Pulses: Normal pulses.  Pulmonary:     Effort: Pulmonary effort is normal.  Abdominal:     General: Abdomen is flat. There is no distension.  Musculoskeletal:        General: Tenderness present.     Cervical back: Normal range of motion.     Comments: Patient is slow to rise from seated position to standing. Mild pain noted with lumbar extension. 5/5 strength noted with bilateral  hip flexion, knee flexion/extension, ankle dorsiflexion/plantarflexion and EHL. No clonus noted bilaterally. No pain upon palpation of greater trochanters. No pain with internal/external rotation of bilateral hips. Sensation intact bilaterally. Negative slump test bilaterally. Ambulates without aid, gait steady.     Skin:    General: Skin is warm and dry.     Capillary Refill: Capillary refill takes less than 2 seconds.  Neurological:     General: No focal deficit present.     Mental Status: He is alert and oriented to person, place, and time.  Psychiatric:        Mood and Affect: Mood normal.        Behavior: Behavior normal.     Ortho Exam  Imaging: No results found.  Past Medical/Family/Surgical/Social  History: Medications & Allergies reviewed per EMR, new medications updated. Patient Active Problem List   Diagnosis Date Noted   Nocturnal leg cramps 01/25/2023   BMI 32.0-32.9,adult Current BMI 32.1 01/25/2023   Morbid obesity (HCC)-start bmi 37.43 12/13/2022   BMI 33.0-33.9,adult-current bmi 33.0 12/13/2022   Prediabetes 10/03/2022   Other hyperlipidemia 10/03/2022   Vitamin D deficiency 10/03/2022   Other fatigue 09/19/2022   SOB (shortness of breath) 09/19/2022   Generalized obesity 09/19/2022   CAD (coronary artery disease) 12/07/2021   Syncope and collapse 05/01/2018   Primary osteoarthritis of right knee 04/22/2018   Pain due to unicompartmental arthroplasty of knee (HCC) 04/19/2018   Post op infection 11/22/2017   Primary osteoarthritis of left knee 11/19/2017   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 NSTEMI 07/2016 -  LHC 08/07/16 showing 70% D1, 20% mLAD, segmental 95% then 75% OM stenosis, 85% acute marg, normal LVEDP - received PTCA/DES to OM.   Encephalitis, viral    GERD (gastroesophageal reflux disease)    Hyperlipidemia 08/07/2016   Hypertension    Ischemic cardiomyopathy    a. LHC 07/2016 - mild mid anterolateral focal hypocontractility felt to be due to the circumflex stenosis, EF 50%.   Joint pain    Myocardial infarction (HCC) 2017   OA (osteoarthritis)    Obesity (BMI 30-39.9) 08/07/2016   Renal  insufficiency    a. 2016 r/t dehydration/ACEI use.   Restless leg syndrome 09/16/2010   Rheumatic fever    Syncope 2009   associated with viral URI   Family History  Problem Relation Age of Onset   Hypertension Mother    Heart disease Mother    Obesity Mother    Prostate cancer Father    Dementia Father    Depression Brother    Hypertension Brother    Hypertension Brother    Stroke Brother    Colon cancer Neg Hx    Colon polyps Neg Hx    Esophageal cancer Neg Hx    Rectal cancer Neg Hx    Stomach cancer Neg Hx    Past Surgical History:  Procedure Laterality Date  CARDIAC CATHETERIZATION N/A 08/07/2016   Procedure: Left Heart Cath and Coronary Angiography;  Surgeon: Lennette Bihari, MD;  Location: [facility] INVASIVE CV LAB;  Service: Cardiovascular;  Laterality: N/A;   CARDIAC CATHETERIZATION N/A 08/07/2016   Procedure: Coronary Stent Intervention;  Surgeon: Lennette Bihari, MD;  Location: [facility] INVASIVE CV LAB;  Service: Cardiovascular;  Laterality: N/A;   COLONOSCOPY  05/21/2012   COLONOSCOPY  12/29/2019   POLYPECTOMY     REPLACEMENT TOTAL KNEE     left and right-  5 total    TONSILLECTOMY     TOTAL KNEE REVISION Left 11/19/2017   Procedure: LEFT TOTAL KNEE REVISION;  Surgeon: Gean Birchwood, MD;  Location: [facility] OR;  Service: Orthopedics;  Laterality: Left;   TOTAL KNEE REVISION Right 04/22/2018   Procedure: RIGHT TOTAL KNEE REVISION;  Surgeon: Gean Birchwood, MD;  Location: [facility] ORS;  Service: Orthopedics;  Laterality: Right;  block   Social History   Occupational History   Occupation: Public affairs consultant: [employer]    Comment: Ronette Deter  Tobacco Use   Smoking status: Never   Smokeless tobacco: Never  Vaping Use   Vaping status: Never Used  Substance and Sexual Activity   Alcohol use: No   Drug use: No   Sexual activity: Yes

## 2023-09-20 ENCOUNTER — Other Ambulatory Visit (INDEPENDENT_AMBULATORY_CARE_PROVIDER_SITE_OTHER): Payer: Self-pay | Admitting: Family Medicine

## 2023-09-20 DIAGNOSIS — R7303 Prediabetes: Secondary | ICD-10-CM

## 2023-10-02 ENCOUNTER — Ambulatory Visit (INDEPENDENT_AMBULATORY_CARE_PROVIDER_SITE_OTHER): Payer: Medicare HMO | Admitting: Family Medicine

## 2023-10-15 ENCOUNTER — Ambulatory Visit (INDEPENDENT_AMBULATORY_CARE_PROVIDER_SITE_OTHER): Payer: Medicare HMO | Admitting: Family Medicine

## 2023-10-25 ENCOUNTER — Other Ambulatory Visit: Payer: Self-pay | Admitting: Adult Health

## 2023-10-25 DIAGNOSIS — N401 Enlarged prostate with lower urinary tract symptoms: Secondary | ICD-10-CM

## 2023-11-20 ENCOUNTER — Encounter (INDEPENDENT_AMBULATORY_CARE_PROVIDER_SITE_OTHER): Payer: Self-pay | Admitting: Family Medicine

## 2023-11-20 ENCOUNTER — Ambulatory Visit (INDEPENDENT_AMBULATORY_CARE_PROVIDER_SITE_OTHER): Payer: Medicare HMO | Admitting: Family Medicine

## 2023-11-20 VITALS — BP 138/80 | HR 74 | Temp 98.4°F | Ht 72.0 in | Wt 243.0 lb

## 2023-11-20 DIAGNOSIS — E559 Vitamin D deficiency, unspecified: Secondary | ICD-10-CM | POA: Diagnosis not present

## 2023-11-20 DIAGNOSIS — I10 Essential (primary) hypertension: Secondary | ICD-10-CM | POA: Diagnosis not present

## 2023-11-20 DIAGNOSIS — E7841 Elevated Lipoprotein(a): Secondary | ICD-10-CM

## 2023-11-20 DIAGNOSIS — E538 Deficiency of other specified B group vitamins: Secondary | ICD-10-CM | POA: Diagnosis not present

## 2023-11-20 DIAGNOSIS — Z6831 Body mass index (BMI) 31.0-31.9, adult: Secondary | ICD-10-CM

## 2023-11-20 DIAGNOSIS — Z6832 Body mass index (BMI) 32.0-32.9, adult: Secondary | ICD-10-CM | POA: Diagnosis not present

## 2023-11-20 DIAGNOSIS — R7303 Prediabetes: Secondary | ICD-10-CM

## 2023-11-20 MED ORDER — VITAMIN D (ERGOCALCIFEROL) 1.25 MG (50000 UNIT) PO CAPS: 50000.0000 [IU] | ORAL_CAPSULE | ORAL | 0 refills | Status: DC

## 2023-11-20 MED ORDER — METFORMIN HCL 500 MG PO TABS: ORAL_TABLET | ORAL | 0 refills | Status: DC

## 2023-11-20 NOTE — Progress Notes (Signed)
[name], D.O.  ABFM, ABOM Specializing in Clinical Bariatric Medicine  Office located at: [address] [place], [place]  [address]   Assessment and Plan:   Review labs obtained today with pt at his next OV (CBC, CMP, A1C, FLP, vit D, and vit B12)  FOR THE DISEASE OF OBESITY: BMI 31.0-31.9,adult - current BMI 32.68 Morbid obesity (HCC)-start bmi 37.43 Assessment & Plan: Since last office visit on 08/28/23 patient's muscle mass has no change. Fat mass has increased by 2.6lb. Total body water has decreased by 5.4lb.  Counseling done on how various foods will affect these numbers and how to maximize success  Total lbs lost to date: 33 lbs Total weight loss percentage to date: -11.96%    Recommended Dietary Goals Jacob Quinn is currently in the action stage of change. As such, his goal is to continue weight management plan. I recommend he eat 30g of protein at breakfast, 40g at lunch, and 50 g at dinner.    He has agreed to: continue current plan   Behavioral Intervention We discussed the following today: increasing lean protein intake to established goals, increasing water intake , continue to work on maintaining a reduced calorie state, getting the recommended amount of protein, incorporating whole foods, making healthy choices, staying well hydrated and practicing mindfulness when eating., and using GPT or another AI platform for recipe ideas- searching "low calorie, low carb, high protein chicken recipes" etc  Additional resources provided today: Handout on healthy eating and balanced plate, Handout on FDA approved anti-obesity medications and adverse effects, Handout on complex carbohydrates and lean sources of protein, handout on protein content of various foods, and Recipes  Evidence-based interventions for health behavior change were utilized today including the discussion of self monitoring techniques, problem-solving barriers and SMART goal setting techniques.    Regarding patient's less desirable eating habits and patterns, we employed the technique of small changes.   Pt will specifically work on: Start walking (as able, given his back pain) for next visit.    Recommended Physical Activity Goals Jacob Quinn has been advised to work up to 150 minutes of moderate intensity aerobic activity a week as he is able for cardiovascular health, weight loss maintenance and preservation of muscle mass.   He has agreed to :  Think about enjoyable ways to increase daily physical activity and overcoming barriers to exercise and Increase physical activity in their day and reduce sedentary time (increase NEAT).   Pharmacotherapy We discussed various medication options to help Jacob Quinn and we both agreed to:  continue with nutritional and behavioral strategies and start anti-obesity medication (Metformin)   FOR ASSOCIATED CONDITIONS ADDRESSED TODAY: Prediabetes Assessment & Plan: Lab Results  Component Value Date   HGBA1C 5.6 06/25/2023   HGBA1C 5.6 01/25/2023   HGBA1C 6.1 (H) 09/19/2022   INSULIN 7.4 01/25/2023   INSULIN 26.5 (H) 09/19/2022   Last A1C is stable. Pt has been off metformin for about 1-2 months. Endorses some cravings and hunger. Mostly snacking in the afternoons. States he has been able to eat all his protein at meal times.   I recommend he resume Metformin and reviewed benefits with pt. Reviewed possible side effects of GI upset, nausea, or loose stools on Metformin. After taking 500 mg Metformin for 1 week at lunch with no intolerances, increase to 500 mg BID to help improve cravings. Will recheck CMP to check kidney function and electrolytes prior to starting Metformin. Work on getting  back on track with his meal plan and starting to exercise as he is able given his limited mobility from back pain. Labs obtained today will be reviewed at his next OV.   Orders: - Resume Metformin - 500 mg once daily for 1 week then  increase to BID.  - Recheck A1C - Recheck CBC and CMP   Vitamin D deficiency Assessment & Plan: Lab Results  Component Value Date   VD25OH 26.6 (L) 06/25/2023   VD25OH 33.4 01/25/2023   VD25OH 23.4 (L) 09/19/2022   Last vitamin D as of 06/01/23 was below goal at 26.6. Pt has not been taking ERGO for about 1-2 months, needs refill.   Reviewed ideal vitamin D levels with pt. Encouraged pt to take ERGO once weekly. Benefits of vitamin D such as improved energy, bone strength, and improved moods were reviewed with pt. Continue with current supplementation on ERGO. Will recheck vitamin D today and review at his next OV.   Orders: - Refill ERGO - Recheck vitamin D today.    Elevated lipoprotein(a) Assessment & Plan: Lab Results  Component Value Date   CHOL 183 01/25/2023   HDL 44 01/25/2023   LDLCALC 122 (H) 01/25/2023   LDLDIRECT 110.4 02/19/2007   TRIG 93 01/25/2023   CHOLHDL 4 11/11/2019   Last obtained lipid panel revealed an elevated LDL of 122. Pt is on Lipitor 80 mg once daily. Tolerating well, no SE. Continue with statin therapy as prescribed. Will obtain a lipid panel today and review with pt at his next OV.   Orders: - Recheck lipid panel labs today   Essential hypertension Assessment & Plan: BP Readings from Last 3 Encounters:  11/20/23 138/80  09/14/23 132/80  08/28/23 122/74   BP is slightly elevated at 130/80. Pt is on Amlodipine 10 mg once daily and Atenolol 25 mg once daily. Tolerating well, no side effects reported. Reviewed BP goal of less than 130/80, pt verbalized understanding. Continue with antihypertensive treatments, no changes today. Will continue to monitor condition alongside PCP/specialists.    Vitamin B 12 deficiency Assessment & Plan: Lab Results  Component Value Date   VITAMINB12 511 09/19/2022   Jacob Quinn has a hx of B12 deficiency. Last obtained B12 was at goal as of 09/19/22. Pt not taking B12 currently. No acute concerns today.  Reviewed  B12 goals of 500 with pt. We will recheck his B12 levels today and review at his next office visit.   Orders: - Recheck B12 today   Follow up:   Return in about 3 weeks (around 12/11/2023). He was informed of the importance of frequent follow up visits to maximize his success with intensive lifestyle modifications for his multiple health conditions.  Subjective:   Chief complaint: Obesity Jacob Quinn is here to discuss his progress with his obesity treatment plan. He is on the Category 3 Plan and states he is following his eating plan approximately 0% of the time. He states he is not exercising.  Interval History:  Jacob Quinn is here for a follow up office visit. Since last OV, he is up 2 lbs. He endorses back pains that limited his mobility over the holidays. He was recently seen at [facility]. Received a steroid injection with reports good relief and is now able to be more active. He has been off his plan due to his back pain but since improvement with sx he is trying to get back on plan.   Barriers identified: low volume of physical activity at  present , orthopedic problems, medical conditions or chronic pain affecting mobility, and medical comorbidities.   Pharmacotherapy for weight loss: He is currently taking no anti-obesity medication.   Review of Systems:  Pertinent positives were addressed with patient today.  Reviewed by clinician on day of visit: allergies, medications, problem list, medical history, surgical history, family history, social history, and previous encounter notes.  Weight Summary and Biometrics   Weight Lost Since Last Visit: 0lb  Weight Gained Since Last Visit: 2lb    Vitals Temp: 98.4 F (36.9 C) BP: 138/80 Pulse Rate: 74 SpO2: 98 %   Anthropometric Measurements Height: 6' (1.829 m) Weight: 243 lb (110.2 kg) BMI (Calculated): 32.95 Weight at Last Visit: 241lb Weight Lost Since Last Visit: 0lb Weight Gained Since Last Visit:  2lb Starting Weight: 276lb Total Weight Loss (lbs): 33 lb (15 kg)   Body Composition  Body Fat %: 34.8 % Fat Mass (lbs): 84.8 lbs Muscle Mass (lbs): 151.2 lbs Total Body Water (lbs): 116.2 lbs Visceral Fat Rating : 21   Other Clinical Data Fasting: Yes Labs: No Today's Visit #: 15 Starting Date: 09/19/22    Objective:   PHYSICAL EXAM: Blood pressure 138/80, pulse 74, temperature 98.4 F (36.9 C), height 6' (1.829 m), weight 243 lb (110.2 kg), SpO2 98%. Body mass index is 32.96 kg/m.  General: he is overweight, cooperative and in no acute distress. PSYCH: Has normal mood, affect and thought process.   HEENT: EOMI, sclerae are anicteric. Lungs: Normal breathing effort, no conversational dyspnea. Extremities: Moves * 4 Neurologic: A and O * 3, good insight  DIAGNOSTIC DATA REVIEWED: BMET    Component Value Date/Time   NA 136 01/25/2023 1103   K 4.5 01/25/2023 1103   CL 98 01/25/2023 1103   CO2 23 01/25/2023 1103   GLUCOSE 91 01/25/2023 1103   GLUCOSE 112 (H) 11/11/2019 0849   BUN 11 01/25/2023 1103   CREATININE 1.13 01/25/2023 1103   CALCIUM 9.7 01/25/2023 1103   GFRNONAA >60 04/23/2018 0544   GFRAA >60 04/23/2018 0544   Lab Results  Component Value Date   HGBA1C 5.6 06/25/2023   HGBA1C 5.6 01/30/2015   Lab Results  Component Value Date   INSULIN 7.4 01/25/2023   INSULIN 26.5 (H) 09/19/2022   Lab Results  Component Value Date   TSH 1.790 09/19/2022   CBC    Component Value Date/Time   WBC 7.6 09/19/2022 1141   WBC 10.0 11/11/2019 0849   RBC 4.87 09/19/2022 1141   RBC 4.31 11/11/2019 0849   HGB 14.0 09/19/2022 1141   HCT 42.3 09/19/2022 1141   PLT 273 09/19/2022 1141   MCV 87 09/19/2022 1141   MCH 28.7 09/19/2022 1141   MCH 27.8 04/23/2018 0544   MCHC 33.1 09/19/2022 1141   MCHC 33.1 11/11/2019 0849   RDW 14.1 09/19/2022 1141   Iron Studies No results found for: "IRON", "TIBC", "FERRITIN", "IRONPCTSAT" Lipid Panel     Component Value  Date/Time   CHOL 183 01/25/2023 1103   TRIG 93 01/25/2023 1103   HDL 44 01/25/2023 1103   CHOLHDL 4 11/11/2019 0849   VLDL 22.0 11/11/2019 0849   LDLCALC 122 (H) 01/25/2023 1103   LDLDIRECT 110.4 02/19/2007 0819   Hepatic Function Panel     Component Value Date/Time   PROT 7.2 01/25/2023 1103   ALBUMIN 4.3 01/25/2023 1103   AST 16 01/25/2023 1103   ALT 14 01/25/2023 1103   ALKPHOS 97 01/25/2023 1103   BILITOT 0.7 01/25/2023  1103   BILIDIR 0.1 02/06/2017 0808      Component Value Date/Time   TSH 1.790 09/19/2022 1141   Nutritional Lab Results  Component Value Date   VD25OH 26.6 (L) 06/25/2023   VD25OH 33.4 01/25/2023   VD25OH 23.4 (L) 09/19/2022    Attestations:   Burnett Sheng, acting as a medical scribe for Thomasene Lot, DO., have compiled all relevant documentation for today's office visit on behalf of Thomasene Lot, DO, while in the presence of [employer], DO.  Reviewed by clinician on day of visit: allergies, medications, problem list, medical history, surgical history, family history, social history, and previous encounter notes pertinent to patient's obesity diagnosis.  I have reviewed the above documentation for accuracy and completeness, and I agree with the above. [name], D.O.  The 21st Century Cures Act was signed into law in 2016 which includes the topic of electronic health records.  This provides immediate access to information in MyChart.  This includes consultation notes, operative notes, office notes, lab results and pathology reports.  If you have any questions about what you read please let us know at your next visit so we can discuss your concerns and take corrective action if need be.  We are right here with you.

## 2023-11-21 LAB — CBC WITH DIFFERENTIAL/PLATELET
Basophils Absolute: 0.1 10*3/uL (ref 0.0–0.2)
Basos: 1 %
EOS (ABSOLUTE): 0.1 10*3/uL (ref 0.0–0.4)
Eos: 1 %
Hematocrit: 44.9 % (ref 37.5–51.0)
Hemoglobin: 14.7 g/dL (ref 13.0–17.7)
Immature Grans (Abs): 0 10*3/uL (ref 0.0–0.1)
Immature Granulocytes: 0 %
Lymphocytes Absolute: 1.7 10*3/uL (ref 0.7–3.1)
Lymphs: 21 %
MCH: 29.6 pg (ref 26.6–33.0)
MCHC: 32.7 g/dL (ref 31.5–35.7)
MCV: 90 fL (ref 79–97)
Monocytes Absolute: 0.6 10*3/uL (ref 0.1–0.9)
Monocytes: 7 %
Neutrophils Absolute: 5.5 10*3/uL (ref 1.4–7.0)
Neutrophils: 70 %
Platelets: 289 10*3/uL (ref 150–450)
RBC: 4.97 x10E6/uL (ref 4.14–5.80)
RDW: 13.9 % (ref 11.6–15.4)
WBC: 7.9 10*3/uL (ref 3.4–10.8)

## 2023-11-21 LAB — COMPREHENSIVE METABOLIC PANEL
ALT: 11 [IU]/L (ref 0–44)
AST: 19 [IU]/L (ref 0–40)
Albumin: 4.4 g/dL (ref 3.9–4.9)
Alkaline Phosphatase: 99 [IU]/L (ref 44–121)
BUN/Creatinine Ratio: 8 — ABNORMAL LOW (ref 10–24)
BUN: 9 mg/dL (ref 8–27)
Bilirubin Total: 0.5 mg/dL (ref 0.0–1.2)
CO2: 24 mmol/L (ref 20–29)
Calcium: 9.9 mg/dL (ref 8.6–10.2)
Chloride: 100 mmol/L (ref 96–106)
Creatinine, Ser: 1.13 mg/dL (ref 0.76–1.27)
Globulin, Total: 3 g/dL (ref 1.5–4.5)
Glucose: 89 mg/dL (ref 70–99)
Potassium: 4.4 mmol/L (ref 3.5–5.2)
Sodium: 140 mmol/L (ref 134–144)
Total Protein: 7.4 g/dL (ref 6.0–8.5)
eGFR: 70 mL/min/{1.73_m2} (ref >59)

## 2023-11-21 LAB — LIPID PANEL
Chol/HDL Ratio: 3.7 {ratio} (ref 0.0–5.0)
Cholesterol, Total: 176 mg/dL (ref 100–199)
HDL: 47 mg/dL (ref >39)
LDL Chol Calc (NIH): 108 mg/dL — ABNORMAL HIGH (ref 0–99)
Triglycerides: 117 mg/dL (ref 0–149)
VLDL Cholesterol Cal: 21 mg/dL (ref 5–40)

## 2023-11-21 LAB — HEMOGLOBIN A1C
Est. average glucose Bld gHb Est-mCnc: 117 mg/dL
Hgb A1c MFr Bld: 5.7 % — ABNORMAL HIGH (ref 4.8–5.6)

## 2023-11-21 LAB — VITAMIN B12: Vitamin B-12: 497 pg/mL (ref 232–1245)

## 2023-11-21 LAB — VITAMIN D 25 HYDROXY (VIT D DEFICIENCY, FRACTURES): Vit D, 25-Hydroxy: 25 ng/mL — ABNORMAL LOW (ref 30.0–100.0)

## 2023-12-17 ENCOUNTER — Other Ambulatory Visit (INDEPENDENT_AMBULATORY_CARE_PROVIDER_SITE_OTHER): Payer: Self-pay | Admitting: Family Medicine

## 2023-12-17 DIAGNOSIS — R7303 Prediabetes: Secondary | ICD-10-CM

## 2023-12-18 ENCOUNTER — Ambulatory Visit (INDEPENDENT_AMBULATORY_CARE_PROVIDER_SITE_OTHER): Payer: Medicaid Other | Admitting: Family Medicine

## 2023-12-18 DIAGNOSIS — Z0000 Encounter for general adult medical examination without abnormal findings: Secondary | ICD-10-CM

## 2023-12-18 NOTE — Patient Instructions (Addendum)
I really enjoyed getting to talk with you today! I am available on Tuesdays and Thursdays for virtual visits if you have any questions or concerns, or if I can be of any further assistance.   CHECKLIST FROM ANNUAL WELLNESS VISIT:  -Follow up (please call to schedule if not scheduled after visit):   -schedule in office visit with Kandee Keen at least once yearly   -yearly for annual wellness visit with primary care office  Here is a list of your preventive care/health maintenance measures and the plan for each if any are due:  PLAN For any measures below that may be due:  -call your gastroenterologist tomorrow to schedule your colonoscopy!   Health Maintenance  Topic Date Due   Colonoscopy  12/29/2022   COVID-19 Vaccine (7 - 2024-25 season) 11/13/2023   DTaP/Tdap/Td (2 - Td or Tdap) 03/24/2024   Medicare Annual Wellness (AWV)  12/17/2024   Pneumonia Vaccine 22+ Years old  Completed   INFLUENZA VACCINE  Completed   Hepatitis C Screening  Completed   Zoster Vaccines- Shingrix  Completed   HPV VACCINES  Aged Out    -See a dentist at least yearly  -Get your eyes checked and then per your eye specialist's recommendations  -Other issues addressed today:   -I have included below further information regarding a healthy whole foods based diet, physical activity guidelines for adults, stress management and opportunities for social connections. I hope you find this information useful.   -----------------------------------------------------------------------------------------------------------------------------------------------------------------------------------------------------------------------------------------------------------    NUTRITION: -eat real food: lots of colorful vegetables (half the plate) and fruits -5-7 servings of vegetables and fruits per day (fresh or steamed is best), exp. 2 servings of vegetables with lunch and dinner and 2 servings of fruit per day. Berries and  greens such as kale and collards are great choices.  -consume on a regular basis:  fresh fruits, fresh veggies, fish, nuts, seeds, healthy oils (such as olive oil, avocado oil), whole grains (make sure first ingredient on label contains the word "whole"), -can eat small amounts of dairy and lean meat (no larger than the palm of your hand), but avoid processed meats such as ham, bacon, lunch meat, etc. -drink water -try to avoid fast food and pre-packaged foods, processed meat, ultra processed foods (donuts, candy, etc.) -most experts advise limiting sodium to < 2300mg  per day, should limit further is any chronic conditions such as high blood pressure, heart disease, diabetes, etc. The American Heart Association advised that < 1500mg  is is ideal -try to avoid foods that contain any ingredients with names you do not recognize  -try to avoid foods with added sugar or sweeteners/sweets  -try to avoid sweet drinks -try to avoid white rice, white bread, pasta (unless whole grain)  EXERCISE GUIDELINES FOR ADULTS: -if you wish to increase your physical activity, do so gradually and with the approval of your doctor -STOP and seek medical care immediately if you have any chest pain, chest discomfort or trouble breathing when starting or increasing exercise  -move and stretch your body, legs, feet and arms when sitting for long periods -Physical activity guidelines for optimal health in adults: -get at least 150 minutes per week of moderate exercise (can talk, but not sing); this is about 20-30 minutes of sustained activity 5-7 days per week or two 10-15 minute episodes of sustained activity 5-7 days per week -do some muscle building/resistance training at least 2 days per week  -balance exercises 3+ days per week:   Stand somewhere where you  have something sturdy to hold onto if you lose balance.    1) lift up on toes, start with 5x per day and work up to 20x   2) stand and lift on leg straight out to the  side so that foot is a few inches of the floor, start with 5x each side and work up to 20x each side   3) stand on one foot, start with 5 seconds each side and work up to 20 seconds on each side  If you need ideas or help with getting more active:  -Silver sneakers https://tools.silversneakers.com  -Walk with a Doc: http://www.duncan-Dede.com/  -try to include resistance (weight lifting/strength building) and balance exercises twice per week: or the following link for ideas: http://castillo-powell.com/  BuyDucts.dk  STRESS MANAGEMENT: -can try meditating, or just sitting quietly with deep breathing while intentionally relaxing all parts of your body for 5 minutes daily -if you need further help with stress, anxiety or depression please follow up with your primary doctor or contact the wonderful folks at [facility]: [phone]  SOCIAL CONNECTIONS: -options in [place] if you wish to engage in more social and exercise related activities:  -Silver sneakers https://tools.silversneakers.com  -Walk with a Doc: http://www.duncan-Bussiere.com/  -Check out the [facility] Active Adults 50+ section on the [place] [employer] (hiking clubs, book clubs, cards and games, chess, exercise classes, aquatic classes and much more) - see the website for details: https://www.[place].gov/departments/parks-recreation/active-adults50  -YouTube has lots of exercise videos for different ages and abilities as well  -[facility] (a variety of indoor and outdoor inperson activities for adults). [phone]. [address]  -Virtual Online Classes (a variety of topics): see seniorplanet.org or call [phone]  -consider volunteering at a school, hospice center, church, senior center or elsewhere          ADVANCED HEALTHCARE DIRECTIVES:  [place] Advanced  Directives assistance:   ExpressWeek.com.cy  Everyone should have advanced health care directives in place. This is so that you get the care you want, should you ever be in a situation where you are unable to make your own medical decisions.   From the [place] Advanced Directive Website: "Advance Health Care Directives are legal documents in which you give written instructions about your health care if, in the future, you cannot speak for yourself.   A health care power of attorney allows you to name a person you trust to make your health care decisions if you cannot make them yourself. A declaration of a desire for a natural death (or living will) is document, which states that you desire not to have your life prolonged by extraordinary measures if you have a terminal or incurable illness or if you are in a vegetative state. An advance instruction for mental health treatment makes a declaration of instructions, information and preferences regarding your mental health treatment. It also states that you are aware that the advance instruction authorizes a mental health treatment provider to act according to your wishes. It may also outline your consent or refusal of mental health treatment. A declaration of an anatomical gift allows anyone over the age of 75 to make a gift by will, organ donor card or other document."   Please see the following website or an elder law attorney for forms, FAQs and for completion of advanced directives: [place] Health Care Directives Advance Health Care Directives (http://guzman.com/)  Or copy and paste the following to your web browser: PoshChat.fi

## 2023-12-18 NOTE — Progress Notes (Signed)
PATIENT CHECK-IN and HEALTH RISK ASSESSMENT QUESTIONNAIRE:  -completed by phone/video for upcoming Medicare Preventive Visit  Pre-Visit Check-in: 1)Vitals (height, wt, BP, etc) - record in vitals section for visit on day of visit Request home vitals (wt, BP, etc.) and enter into vitals, THEN update Vital Signs SmartPhrase below at the top of the HPI. See below.  2)Review and Update Medications, Allergies PMH, Surgeries, Social history in Epic 3)Hospitalizations in the last year with date/reason? n  4)Review and Update Care Team (patient's specialists) in Epic 5) Complete PHQ9 in Epic  6) Complete Fall Screening in Epic 7)Review all Health Maintenance Due and order under PCP if not done.  Medicare Wellness Patient Questionnaire:  Answer theses question about your habits: How often do you have a drink containing alcohol?n Have you ever smoked?n Do you use an illicit drugs? n On average, how many days per week do you engage in moderate to strenuous exercise (like a brisk walk)? No, works 5 days a week and is pretty active Are you sexually active? no Diet: has been going to a weight loss clinic - has lost about 40lbs with cutting out sweets and the starches  Answer theses question about your everyday activities: Can you perform most household chores? y Are you deaf or have significant trouble hearing? no Do you feel that you have a problem with memory? no Do you feel safe at home?y Last dentist visit? dentures 8. Do you have any difficulty performing your everyday activities?n Are you having any difficulty walking, taking medications on your own, and or difficulty managing daily home needs?n Do you have difficulty walking or climbing stairs?n Do you have difficulty dressing or bathing?n Do you have difficulty doing errands alone such as visiting a doctor's office or shopping?n Do you currently have any difficulty preparing food and eating?n Do you currently have any difficulty using  the toilet?n Do you have any difficulty managing your finances?n Do you have any difficulties with housekeeping of managing your housekeeping?n   Do you have Advanced Directives in place (Living Will, Healthcare Power or Attorney)? no   Last eye Exam and location? [facility] 1 [facility] [place]   Do you currently use prescribed or non-prescribed narcotic or opioid pain medications?no    ----------------------------------------------------------------------------------------------------------------------------------------------------------------------------------------------------------------------  Because this visit was a virtual/telehealth visit, some criteria may be missing or patient reported. Any vitals not documented were not able to be obtained and vitals that have been documented are patient reported.    MEDICARE ANNUAL PREVENTIVE CARE VISIT WITH PROVIDER (Welcome to Medicare, initial annual wellness or annual wellness exam)  Virtual Visit via Phone Note  I connected with Jacob Quinn on 12/18/23  by phone and verified that I am speaking with the correct person using two identifiers. He prefers to talk by phone and declines video visit.   Location patient: home Location provider:work or home office Persons participating in the virtual visit: patient, provider  Concerns and/or follow up today: stable, no concerns   See HM section in Epic for other details of completed HM.    ROS: negative for report of fevers, unintentional weight loss, vision changes, vision loss, hearing loss or change, chest pain, sob, hemoptysis, melena, hematochezia, hematuria, falls, bleeding or bruising, thoughts of suicide or self harm, memory loss  Patient-completed extensive health risk assessment - reviewed and discussed with the patient: See Health Risk Assessment completed with patient prior to the visit either above or in recent phone note.  This was reviewed in detailed with  the patient today and appropriate recommendations, orders and referrals were placed as needed per Summary below and patient instructions.   Review of Medical History: -PMH, PSH, Family History and current specialty and care providers reviewed and updated and listed below   Patient Care Team: Shirline Frees, NP as PCP - General (Family Medicine) Lennette Bihari, MD as PCP - Cardiology (Cardiology)   Past Medical History:  Diagnosis Date   1st degree AV block    Back pain    BPH associated with nocturia    CAD in native artery    a. NSTEMI 07/2016 -  LHC 08/07/16 showing 70% D1, 20% mLAD, segmental 95% then 75% OM stenosis, 85% acute marg, normal LVEDP - received PTCA/DES to OM.   Encephalitis, viral    GERD (gastroesophageal reflux disease)    Hyperlipidemia 08/07/2016   Hypertension    Ischemic cardiomyopathy    a. LHC 07/2016 - mild mid anterolateral focal hypocontractility felt to be due to the circumflex stenosis, EF 50%.   Joint pain    Myocardial infarction (HCC) 2017   OA (osteoarthritis)    Obesity (BMI 30-39.9) 08/07/2016   Renal insufficiency    a. 2016 r/t dehydration/ACEI use.   Restless leg syndrome 09/16/2010   Rheumatic fever    Syncope 2009   associated with viral URI    Past Surgical History:  Procedure Laterality Date   CARDIAC CATHETERIZATION N/A 08/07/2016   Procedure: Left Heart Cath and Coronary Angiography;  Surgeon: Lennette Bihari, MD;  Location: [facility];  Service: Cardiovascular;  Laterality: N/A;   CARDIAC CATHETERIZATION N/A 08/07/2016   Procedure: Coronary Stent Intervention;  Surgeon: Lennette Bihari, MD;  Location: [facility];  Service: Cardiovascular;  Laterality: N/A;   COLONOSCOPY  05/21/2012   COLONOSCOPY  12/29/2019   POLYPECTOMY     REPLACEMENT TOTAL KNEE     left and right-  5 total    TONSILLECTOMY     TOTAL KNEE REVISION Left 11/19/2017   Procedure: LEFT TOTAL KNEE REVISION;  Surgeon: Gean Birchwood, MD;  Location: [facility]  OR;  Service: Orthopedics;  Laterality: Left;   TOTAL KNEE REVISION Right 04/22/2018   Procedure: RIGHT TOTAL KNEE REVISION;  Surgeon: Gean Birchwood, MD;  Location: [facility] ORS;  Service: Orthopedics;  Laterality: Right;  block    Social History   Socioeconomic History   Marital status: Single    Spouse name: Not on file   Number of children: 3   Years of education: Not on file   Highest education level: Some college, no degree  Occupational History   Occupation: Public affairs consultant: [employer]    Comment: Ronette [employer]  Tobacco Use   Smoking status: Never   Smokeless tobacco: Never  Vaping Use   Vaping status: Never Used  Substance and Sexual Activity   Alcohol use: No   Drug use: No   Sexual activity: Yes  Other Topics Concern   Not on file  Social History Narrative   Lives with daughter   Is retired -    Social Drivers of Corporate investment banker Strain: Low Risk  (12/18/2023)   Overall Financial Resource Strain (CARDIA)    Difficulty of Paying Living Expenses: Not very hard  Food Insecurity: Food Insecurity Present (12/18/2023)   Hunger Vital Sign    Worried About Running Out of Food in the Last Year: Never true  Ran Out of Food in the Last Year: Sometimes true  Transportation Needs: No Transportation Needs (12/18/2023)   PRAPARE - Administrator, Civil Service (Medical): No    Lack of Transportation (Non-Medical): No  Physical Activity: Insufficiently Active (12/18/2023)   Exercise Vital Sign    Days of Exercise per Week: 3 days    Minutes of Exercise per Session: 30 min  Stress: Patient Declined (12/18/2023)   [employer] of Occupational Health - Occupational Stress Questionnaire    Feeling of Stress : Patient declined  Social Connections: Moderately Isolated (12/18/2023)   Social Connection and Isolation Panel [NHANES]    Frequency of Communication with Friends and Family: More than three times a week    Frequency of Social Gatherings  with Friends and Family: Three times a week    Attends Religious Services: 1 to 4 times per year    Active Member of Clubs or Organizations: No    Attends Banker Meetings: Not on file    Marital Status: Divorced  Intimate Partner Violence: Not At Risk (05/22/2022)   Humiliation, Afraid, Rape, and Kick questionnaire    Fear of Current or Ex-Partner: No    Emotionally Abused: No    Physically Abused: No    Sexually Abused: No    Family History  Problem Relation Age of Onset   Hypertension Mother    Heart disease Mother    Obesity Mother    Prostate cancer Father    Dementia Father    Depression Brother    Hypertension Brother    Hypertension Brother    Stroke Brother    Colon cancer Neg Hx    Colon polyps Neg Hx    Esophageal cancer Neg Hx    Rectal cancer Neg Hx    Stomach cancer Neg Hx     Current Outpatient Medications on File Prior to Visit  Medication Sig Dispense Refill   amLODipine (NORVASC) 10 MG tablet TAKE 1 TABLET BY MOUTH EVERY DAY 90 tablet 1   aspirin 81 MG tablet Take 81 mg by mouth daily.     atenolol (TENORMIN) 25 MG tablet Take 25 mg by mouth daily.     atorvastatin (LIPITOR) 80 MG tablet Take by mouth.     cyanocobalamin (VITAMIN B12) 500 MCG tablet Take 1 tablet (500 mcg total) by mouth daily.     metFORMIN (GLUCOPHAGE) 500 MG tablet 1 po with lunch daily, after 1 wk, inc to 1 tab with lunch and 1 with dinner 60 tablet 0   Multiple Vitamin (MULTI VITAMIN DAILY) TABS Take 1 tablet by mouth daily.     nitroGLYCERIN (NITROSTAT) 0.4 MG SL tablet Place 1 tablet (0.4 mg total) under the tongue every 5 (five) minutes as needed for chest pain. Up to 3 doses. 25 tablet 3   tamsulosin (FLOMAX) 0.4 MG CAPS capsule Take 1 capsule (0.4 mg total) by mouth daily after breakfast. 90 capsule 0   Vitamin D, Ergocalciferol, (DRISDOL) 1.25 MG (50000 UNIT) CAPS capsule Take 1 capsule (50,000 Units total) by mouth every 7 (seven) days. 4 capsule 0   No current  facility-administered medications on file prior to visit.    Allergies  Allergen Reactions   Penicillins Anaphylaxis    *Has tolerated cephalosporins in the past*  Has patient had a PCN reaction causing immediate rash, facial/tongue/throat swelling, SOB or lightheadedness with hypotension: Yes Has patient had a PCN reaction causing severe rash involving mucus membranes or skin necrosis: No  Has patient had a PCN reaction that required hospitalization: No Has patient had a PCN reaction occurring within the last 10 years: No If all of the above answers are "NO", then may proceed with Cephalosporin use.    Lisinopril     Cramp    Tizanidine Other (See Comments)    High fevers, shaking   Latex Rash       Physical Exam Vitals requested from patient and listed below if patient had equipment and was able to obtain at home for this virtual visit: There were no vitals filed for this visit. Estimated body mass index is 32.96 kg/m as calculated from the following:   Height as of 11/20/23: 6' (1.829 m).   Weight as of 11/20/23: 243 lb (110.2 kg).  EKG (optional): deferred due to virtual visit  GENERAL: alert, oriented, no acute distress detected; full vision exam deferred due to pandemic and/or virtual encounter  PSYCH/NEURO: pleasant and cooperative, no obvious depression or anxiety, speech and thought processing grossly intact, Cognitive function grossly intact  Flowsheet Row Office Visit from 11/02/2022 in [facility] [place]  PHQ-9 Total Score 1           12/18/2023    5:46 PM 11/02/2022    9:56 AM 09/15/2022   10:42 AM 05/22/2022    9:23 AM 05/12/2022    9:59 AM  Depression screen PHQ 2/9  Decreased Interest 0 0 0 0 0  Down, Depressed, Hopeless 0 0  0 0  PHQ - 2 Score 0 0 0 0 0  Altered sleeping  0 0    Tired, decreased energy  1 0    Change in appetite  0 1    Feeling bad or failure about yourself   0 0    Trouble concentrating  0 0    Moving  slowly or fidgety/restless  0 0    Suicidal thoughts  0 0    PHQ-9 Score  1 1    Difficult doing work/chores  Not difficult at all Not difficult at all         05/12/2022    9:57 AM 05/22/2022    9:27 AM 11/02/2022    9:56 AM 12/18/2023    7:09 AM 12/18/2023    5:46 PM  Fall Risk  Falls in the past year? 0 0 0 1 0  Was there an injury with Fall? 0 0 0 0 0  Fall Risk Category Calculator 0 0 0 1  0  Fall Risk Category (Retired) Low Low Low    (RETIRED) Patient Fall Risk Level Low fall risk Low fall risk Low fall risk    Patient at Risk for Falls Due to No Fall Risks No Fall Risks No Fall Risks    Fall risk Follow up Falls evaluation completed  Falls evaluation completed       Patient-reported     SUMMARY AND PLAN:  Encounter for Medicare annual wellness exam   Discussed applicable health maintenance/preventive health measures and advised and referred or ordered per patient preferences: -discussed colonoscopy being due and he wants to call himself to schedule - provided him with number to call and date due -advised of covid immunization recommendations Health Maintenance  Topic Date Due   Colonoscopy  12/29/2022   COVID-19 Vaccine (7 - 2024-25 season) 11/13/2023   DTaP/Tdap/Td (2 - Td or Tdap) 03/24/2024   Medicare Annual Wellness (AWV)  12/17/2024   Pneumonia Vaccine 12+ Years old  Completed  INFLUENZA VACCINE  Completed   Hepatitis C Screening  Completed   Zoster Vaccines- Shingrix  Completed   HPV VACCINES  Aged [employer] and counseling on the following was provided based on the above review of health and a plan/checklist for the patient, along with additional information discussed, was provided for the patient in the patient instructions :  -Advised on importance of completing advanced directives, discussed options for completing and provided information in patient instructions as well -Advised and counseled on a healthy lifestyle - including the importance of a  healthy diet, regular physical activity -Reviewed patient's current diet. Advised and counseled on a whole foods based healthy diet. A summary of a healthy diet was provided in the Patient Instructions. Congratulated on changes he has made and success. Encouraged to continued. -reviewed patient's current physical activity level and discussed exercise guidelines for adults. Discussed community resources and ideas for safe exercise at home to assist in meeting exercise guideline recommendations in a safe and healthy way. He has silver sneakers card. It sounds like after our discussion that he is considering adding some exercise on the weekend as he does not have much time during the week due to work schedule.  -Advise yearly dental visits at minimum and regular eye exams   Follow up: see patient instructions   Patient Instructions  I really enjoyed getting to talk with you today! I am available on Tuesdays and Thursdays for virtual visits if you have any questions or concerns, or if I can be of any further assistance.   CHECKLIST FROM ANNUAL WELLNESS VISIT:  -Follow up (please call to schedule if not scheduled after visit):   -schedule in office visit with Kandee Keen at least once yearly   -yearly for annual wellness visit with primary care office  Here is a list of your preventive care/health maintenance measures and the plan for each if any are due:  PLAN For any measures below that may be due:  -call your gastroenterologist tomorrow to schedule your colonoscopy!   Health Maintenance  Topic Date Due   Colonoscopy  12/29/2022   COVID-19 Vaccine (7 - 2024-25 season) 11/13/2023   DTaP/Tdap/Td (2 - Td or Tdap) 03/24/2024   Medicare Annual Wellness (AWV)  12/17/2024   Pneumonia Vaccine 49+ Years old  Completed   INFLUENZA VACCINE  Completed   Hepatitis C Screening  Completed   Zoster Vaccines- Shingrix  Completed   HPV VACCINES  Aged Out    -See a dentist at least yearly  -Get your eyes  checked and then per your eye specialist's recommendations  -Other issues addressed today:   -I have included below further information regarding a healthy whole foods based diet, physical activity guidelines for adults, stress management and opportunities for social connections. I hope you find this information useful.   -----------------------------------------------------------------------------------------------------------------------------------------------------------------------------------------------------------------------------------------------------------    NUTRITION: -eat real food: lots of colorful vegetables (half the plate) and fruits -5-7 servings of vegetables and fruits per day (fresh or steamed is best), exp. 2 servings of vegetables with lunch and dinner and 2 servings of fruit per day. Berries and greens such as kale and collards are great choices.  -consume on a regular basis:  fresh fruits, fresh veggies, fish, nuts, seeds, healthy oils (such as olive oil, avocado oil), whole grains (make sure first ingredient on label contains the word "whole"), -can eat small amounts of dairy and lean meat (no larger than the palm of your hand), but avoid processed meats  such as ham, bacon, lunch meat, etc. -drink water -try to avoid fast food and pre-packaged foods, processed meat, ultra processed foods (donuts, candy, etc.) -most experts advise limiting sodium to < 2300mg  per day, should limit further is any chronic conditions such as high blood pressure, heart disease, diabetes, etc. The American Heart Association advised that < 1500mg  is is ideal -try to avoid foods that contain any ingredients with names you do not recognize  -try to avoid foods with added sugar or sweeteners/sweets  -try to avoid sweet drinks -try to avoid white rice, white bread, pasta (unless whole grain)  EXERCISE GUIDELINES FOR ADULTS: -if you wish to increase your physical activity, do so gradually and  with the approval of your doctor -STOP and seek medical care immediately if you have any chest pain, chest discomfort or trouble breathing when starting or increasing exercise  -move and stretch your body, legs, feet and arms when sitting for long periods -Physical activity guidelines for optimal health in adults: -get at least 150 minutes per week of moderate exercise (can talk, but not sing); this is about 20-30 minutes of sustained activity 5-7 days per week or two 10-15 minute episodes of sustained activity 5-7 days per week -do some muscle building/resistance training at least 2 days per week  -balance exercises 3+ days per week:   Stand somewhere where you have something sturdy to hold onto if you lose balance.    1) lift up on toes, start with 5x per day and work up to 20x   2) stand and lift on leg straight out to the side so that foot is a few inches of the floor, start with 5x each side and work up to 20x each side   3) stand on one foot, start with 5 seconds each side and work up to 20 seconds on each side  If you need ideas or help with getting more active:  -Silver sneakers https://tools.silversneakers.com  -Walk with a Doc: http://www.duncan-Lewis.com/  -try to include resistance (weight lifting/strength building) and balance exercises twice per week: or the following link for ideas: http://castillo-powell.com/  BuyDucts.dk  STRESS MANAGEMENT: -can try meditating, or just sitting quietly with deep breathing while intentionally relaxing all parts of your body for 5 minutes daily -if you need further help with stress, anxiety or depression please follow up with your primary doctor or contact the wonderful folks at [facility]: [phone]  SOCIAL CONNECTIONS: -options in [place] if you wish to engage in more social and exercise related activities:  -Silver  sneakers https://tools.silversneakers.com  -Walk with a Doc: http://www.duncan-Kerce.com/  -Check out the [facility] Active Adults 50+ section on the [place] of [employer] (hiking clubs, book clubs, cards and games, chess, exercise classes, aquatic classes and much more) - see the website for details: https://www.[place].gov/departments/parks-recreation/active-adults50  -YouTube has lots of exercise videos for different ages and abilities as well  -[facility] (a variety of indoor and outdoor inperson activities for adults). [phone]. [address]  -Virtual Online Classes (a variety of topics): see seniorplanet.org or call [phone]  -consider volunteering at a school, hospice center, church, senior center or elsewhere          ADVANCED HEALTHCARE DIRECTIVES:  [place] Advanced Directives assistance:   ExpressWeek.com.cy  Everyone should have advanced health care directives in place. This is so that you get the care you want, should you ever be in a situation where you are unable to make your own medical decisions.  From the [place] Advanced Directive Website: "Advance Health Care Directives are legal documents in which you give written instructions about your health care if, in the future, you cannot speak for yourself.   A health care power of attorney allows you to name a person you trust to make your health care decisions if you cannot make them yourself. A declaration of a desire for a natural death (or living will) is document, which states that you desire not to have your life prolonged by extraordinary measures if you have a terminal or incurable illness or if you are in a vegetative state. An advance instruction for mental health treatment makes a declaration of instructions, information and preferences regarding your mental health treatment. It also states that you are aware that the  advance instruction authorizes a mental health treatment provider to act according to your wishes. It may also outline your consent or refusal of mental health treatment. A declaration of an anatomical gift allows anyone over the age of 70 to make a gift by will, organ donor card or other document."   Please see the following website or an elder law attorney for forms, FAQs and for completion of advanced directives: [place] TEFL teacher Health Care Directives Advance Health Care Directives (http://guzman.com/)  Or copy and paste the following to your web browser: PoshChat.fi    Terressa Koyanagi, DO

## 2023-12-20 ENCOUNTER — Telehealth: Payer: Self-pay | Admitting: Adult Health

## 2023-12-20 NOTE — Telephone Encounter (Signed)
Called pt to sch annual physical per 2/18 notes from Dr. Selena Batten. No answer and no vm set up.

## 2023-12-25 ENCOUNTER — Other Ambulatory Visit (INDEPENDENT_AMBULATORY_CARE_PROVIDER_SITE_OTHER): Payer: Self-pay | Admitting: Family Medicine

## 2023-12-25 DIAGNOSIS — R7303 Prediabetes: Secondary | ICD-10-CM

## 2023-12-26 ENCOUNTER — Other Ambulatory Visit (INDEPENDENT_AMBULATORY_CARE_PROVIDER_SITE_OTHER): Payer: Self-pay | Admitting: Family Medicine

## 2023-12-26 DIAGNOSIS — R7303 Prediabetes: Secondary | ICD-10-CM

## 2024-01-01 ENCOUNTER — Ambulatory Visit (INDEPENDENT_AMBULATORY_CARE_PROVIDER_SITE_OTHER): Payer: Medicare HMO | Admitting: Family Medicine

## 2024-01-01 ENCOUNTER — Encounter (INDEPENDENT_AMBULATORY_CARE_PROVIDER_SITE_OTHER): Payer: Self-pay | Admitting: Family Medicine

## 2024-01-01 VITALS — BP 162/83 | HR 52 | Temp 98.1°F | Ht 72.0 in | Wt 251.0 lb

## 2024-01-01 DIAGNOSIS — I10 Essential (primary) hypertension: Secondary | ICD-10-CM | POA: Diagnosis not present

## 2024-01-01 DIAGNOSIS — I2510 Atherosclerotic heart disease of native coronary artery without angina pectoris: Secondary | ICD-10-CM | POA: Diagnosis not present

## 2024-01-01 DIAGNOSIS — Z6834 Body mass index (BMI) 34.0-34.9, adult: Secondary | ICD-10-CM | POA: Diagnosis not present

## 2024-01-01 DIAGNOSIS — E559 Vitamin D deficiency, unspecified: Secondary | ICD-10-CM

## 2024-01-01 DIAGNOSIS — R7303 Prediabetes: Secondary | ICD-10-CM

## 2024-01-01 DIAGNOSIS — E538 Deficiency of other specified B group vitamins: Secondary | ICD-10-CM | POA: Diagnosis not present

## 2024-01-01 DIAGNOSIS — E7841 Elevated Lipoprotein(a): Secondary | ICD-10-CM | POA: Diagnosis not present

## 2024-01-01 DIAGNOSIS — Z6831 Body mass index (BMI) 31.0-31.9, adult: Secondary | ICD-10-CM

## 2024-01-01 MED ORDER — METFORMIN HCL 500 MG PO TABS: ORAL_TABLET | ORAL | 1 refills | Status: DC

## 2024-01-01 MED ORDER — CYANOCOBALAMIN 500 MCG PO TABS: 500.0000 ug | ORAL_TABLET | Freq: Every day | ORAL | Status: DC

## 2024-01-01 MED ORDER — VITAMIN D (ERGOCALCIFEROL) 1.25 MG (50000 UNIT) PO CAPS
ORAL_CAPSULE | ORAL | 1 refills | Status: DC
Start: 2024-01-01 — End: 2024-02-12

## 2024-01-01 NOTE — Progress Notes (Signed)
 Jacob Quinn, D.O.  ABFM, ABOM Specializing in Clinical Bariatric Medicine  Office located at: [address] [place], [place]  [address]   Assessment and Plan:   FOR THE DISEASE OF OBESITY:  BMI 31.0-31.9,adult - current BMI 34.03 Morbid obesity (HCC)-start bmi 37.43 Assessment & Plan: Since last office visit on 11/20/2023 patient's muscle mass has increased by 2.4 lb. Fat mass has increased by 5 lb. Total body water has increased by 5.4 lb.  Counseling done on how various foods will affect these numbers and how to maximize success  Total lbs lost to date: 25 lbs  Total weight loss percentage to date: 9.06%    Recommended Dietary Goals [name] is currently in the action stage of change. As such, his goal is to continue weight management plan.  He has agreed to: continue current plan   Behavioral Intervention We discussed the following today: increasing lean protein intake to established goals and decreasing simple carbohydrates   Additional resources provided today: {DOhandouts:31655::"None"}  Evidence-based interventions for health behavior change were utilized today including the discussion of self monitoring techniques, problem-solving barriers and SMART goal setting techniques.   Regarding patient's less desirable eating habits and patterns, we employed the technique of small changes.   Pt will specifically work on: n/a   Recommended Physical Activity Goals [name] has been advised to work up to 150 minutes of moderate intensity aerobic activity a week and strengthening exercises 2-3 times per week for cardiovascular health, weight loss maintenance and preservation of muscle mass.   He has agreed to : continue walking regimen and consider adding 2 days a week of resistance training.    Pharmacotherapy We both agreed to : {EMagreedrx:29170}   FOR ASSOCIATED CONDITIONS ADDRESSED TODAY:  Essential hypertension Assessment & Plan: Last 3 blood pressure  readings in our office are as follows: BP Readings from Last 3 Encounters:  01/01/24 (!) 162/83  11/20/23 138/80  09/14/23 132/80   BP above target - pt completely asymptomatic. He endorses not taking his blood pressure medications toda running late to work.     Above goal BP  Not checking BP at work  Did not take meds today for blood pressure because was running late to work.  Check bP 2-3 times a week at home.     Prediabetes Assessment & Plan: Lab Results  Component Value Date   HGBA1C 5.7 (H) 11/20/2023   HGBA1C 5.6 06/25/2023   HGBA1C 5.6 01/25/2023   Lab Results  Component Value Date   CREATININE 1.13 11/20/2023   BUN 9 11/20/2023   NA 140 11/20/2023   K 4.4 11/20/2023   CL 100 11/20/2023   CO2 24 11/20/2023      Component Value Date/Time   PROT 7.4 11/20/2023 0956   ALBUMIN 4.4 11/20/2023 0956   AST 19 11/20/2023 0956   ALT 11 11/20/2023 0956   ALKPHOS 99 11/20/2023 0956   BILITOT 0.5 11/20/2023 0956   BILIDIR 0.1 02/06/2017 0808   Lab Results  Component Value Date   WBC 7.9 11/20/2023   HGB 14.7 11/20/2023   HCT 44.9 11/20/2023   MCV 90 11/20/2023   PLT 289 11/20/2023   Liver function. Kideny, electrolytes, cbc are WNL.     Slightly worsened A1c    Not taking metformin - ran out. When he ran out and stopped tking felt more hunger. Was taking twice daily w/ no problems b4 running out    CAD, multiple vessel- 2 stents Elevated lipoprotein(a)  Assessment & Plan: Lab Results  Component Value Date   CHOL 176 11/20/2023   HDL 47 11/20/2023   LDLCALC 108 (H) 11/20/2023   LDLDIRECT 110.4 02/19/2007   TRIG 117 11/20/2023   CHOLHDL 3.7 11/20/2023   Every within  LDL improved from 122 to 107. LDL goal should be less than 70.  Taking lipitor on most days - take every night is plan    existiing coornary artery disease. Hx of two stents.  Last cardaic cathc 07/2016. ECHO on 2021 looked great.     showed 70% stenosis.      Still seeing  cardiology  Go to [facility] and read about cholesterol for extra knowledge   Pt advised to reduce saturated and trans fats in diet.   Read met no more than 2x per week   Vitamin D deficiency Assessment & Plan: Lab Results  Component Value Date   VD25OH 25.0 (L) 11/20/2023   VD25OH 26.6 (L) 06/25/2023   VD25OH 33.4 01/25/2023       Vitamin D was worse. Reviewd importance of vit D.  Prior to labs was taking regularly and is currently taking regularly.  Increase to twice weekly.     Vitamin B 12 deficiency Assessment & Plan: Lab Results  Component Value Date   VITAMINB12 497 11/20/2023     Ackenolwedes sometimes skipping a few days of B12 B12 is stable Take everyday.      Follow up:   No follow-ups on file. He was informed of the importance of frequent follow up visits to maximize his success with intensive lifestyle modifications for his multiple health conditions  Subjective:   Chief complaint: Obesity Jacob Quinn is here to discuss his progress with his obesity treatment plan. He is on the Category 3 Plan and states he is following his eating plan approximately 10% of the time. He states he is walking 30-60 minutes 3 days per week.  Interval History:  Jacob Quinn is here for a follow up office visit. Since last OV on 11/20/2023, [name] is up 8 lbs.    Has been drinnking 2-3 diet sodas Admiting to deviating to meal plan. Difficult ttime getting in proteins       Review of Systems:  Pertinent positives were addressed with patient today.  Reviewed by clinician on day of visit: allergies, medications, problem list, medical history, surgical history, family history, social history, and previous encounter notes.  Weight Summary and Biometrics   Weight Lost Since Last Visit: 0  Weight Gained Since Last Visit: 8 lb   Vitals Temp: 98.1 F (36.7 C) BP: (!) 162/83 Pulse Rate: (!) 52 SpO2: 97 %   Anthropometric Measurements Height: 6' (1.829  m) Weight: 251 lb (113.9 kg) BMI (Calculated): 34.03 Weight at Last Visit: 243 lb Weight Lost Since Last Visit: 0 Weight Gained Since Last Visit: 8 lb Starting Weight: 276 lb Total Weight Loss (lbs): 25 lb (11.3 kg)   Body Composition  Body Fat %: 35.7 % Fat Mass (lbs): 89.8 lbs Muscle Mass (lbs): 153.6 lbs Total Body Water (lbs): 121.6 lbs Visceral Fat Rating : 22   Other Clinical Data Today's Visit #: 16 Starting Date: 09/19/22   Objective:   PHYSICAL EXAM: Blood pressure (!) 162/83, pulse (!) 52, temperature 98.1 F (36.7 C), height 6' (1.829 m), weight 251 lb (113.9 kg), SpO2 97%. Body mass index is 34.04 kg/m.  General: he is overweight, cooperative and in no acute distress. PSYCH: Has normal mood, affect and thought process.  HEENT: EOMI, sclerae are anicteric. Lungs: Normal breathing effort, no conversational dyspnea. Extremities: Moves * 4 Neurologic: A and O * 3, good insight  DIAGNOSTIC DATA REVIEWED: BMET    Component Value Date/Time   NA 140 11/20/2023 0956   K 4.4 11/20/2023 0956   CL 100 11/20/2023 0956   CO2 24 11/20/2023 0956   GLUCOSE 89 11/20/2023 0956   GLUCOSE 112 (H) 11/11/2019 0849   BUN 9 11/20/2023 0956   CREATININE 1.13 11/20/2023 0956   CALCIUM 9.9 11/20/2023 0956   GFRNONAA >60 04/23/2018 0544   GFRAA >60 04/23/2018 0544   Lab Results  Component Value Date   HGBA1C 5.7 (H) 11/20/2023   HGBA1C 5.6 01/30/2015   Lab Results  Component Value Date   INSULIN 7.4 01/25/2023   INSULIN 26.5 (H) 09/19/2022   Lab Results  Component Value Date   TSH 1.790 09/19/2022   CBC    Component Value Date/Time   WBC 7.9 11/20/2023 0956   WBC 10.0 11/11/2019 0849   RBC 4.97 11/20/2023 0956   RBC 4.31 11/11/2019 0849   HGB 14.7 11/20/2023 0956   HCT 44.9 11/20/2023 0956   PLT 289 11/20/2023 0956   MCV 90 11/20/2023 0956   MCH 29.6 11/20/2023 0956   MCH 27.8 04/23/2018 0544   MCHC 32.7 11/20/2023 0956   MCHC 33.1 11/11/2019 0849    RDW 13.9 11/20/2023 0956   Iron Studies No results found for: "IRON", "TIBC", "FERRITIN", "IRONPCTSAT" Lipid Panel     Component Value Date/Time   CHOL 176 11/20/2023 0956   TRIG 117 11/20/2023 0956   HDL 47 11/20/2023 0956   CHOLHDL 3.7 11/20/2023 0956   CHOLHDL 4 11/11/2019 0849   VLDL 22.0 11/11/2019 0849   LDLCALC 108 (H) 11/20/2023 0956   LDLDIRECT 110.4 02/19/2007 0819   Hepatic Function Panel     Component Value Date/Time   PROT 7.4 11/20/2023 0956   ALBUMIN 4.4 11/20/2023 0956   AST 19 11/20/2023 0956   ALT 11 11/20/2023 0956   ALKPHOS 99 11/20/2023 0956   BILITOT 0.5 11/20/2023 0956   BILIDIR 0.1 02/06/2017 0808      Component Value Date/Time   TSH 1.790 09/19/2022 1141   Nutritional Lab Results  Component Value Date   VD25OH 25.0 (L) 11/20/2023   VD25OH 26.6 (L) 06/25/2023   VD25OH 33.4 01/25/2023    Attestations:   I, Special Puri, acting as a Stage manager for Thomasene Lot, DO., have compiled all relevant documentation for today's office visit on behalf of Thomasene Lot, DO, while in the presence of [employer], DO.  Reviewed by clinician on day of visit: allergies, medications, problem list, medical history, surgical history, family history, social history, and previous encounter notes pertinent to patient's obesity diagnosis. I have spent 40 minutes in the care of the patient today including: preparing to see patient (e.g. review and interpretation of tests, old notes ), obtaining and/or reviewing separately obtained history, performing a medically appropriate examination or evaluation, counseling and educating the patient, ordering medications, test or procedures, documenting clinical information in the electronic or other health care record, and independently interpreting results and communicating results to the patient, family, or caregiver   I have reviewed the above documentation for accuracy and completeness, and I agree with the above.  Jacob Quinn, D.O.  The 21st Century Cures Act was signed into law in 2016 which includes the topic of electronic health records.  This provides immediate access to information in MyChart.  This includes consultation notes, operative notes, office notes, lab results and pathology reports.  If you have any questions about what you read please let us know at your next visit so we can discuss your concerns and take corrective action if need be.  We are right here with you.

## 2024-01-06 ENCOUNTER — Other Ambulatory Visit: Payer: Self-pay | Admitting: Adult Health

## 2024-01-22 ENCOUNTER — Other Ambulatory Visit (INDEPENDENT_AMBULATORY_CARE_PROVIDER_SITE_OTHER): Payer: Self-pay | Admitting: Family Medicine

## 2024-01-22 DIAGNOSIS — E559 Vitamin D deficiency, unspecified: Secondary | ICD-10-CM

## 2024-01-24 ENCOUNTER — Other Ambulatory Visit (INDEPENDENT_AMBULATORY_CARE_PROVIDER_SITE_OTHER): Payer: Self-pay | Admitting: Family Medicine

## 2024-01-24 DIAGNOSIS — R7303 Prediabetes: Secondary | ICD-10-CM

## 2024-02-05 ENCOUNTER — Ambulatory Visit (INDEPENDENT_AMBULATORY_CARE_PROVIDER_SITE_OTHER): Payer: Medicare HMO | Admitting: Adult Health

## 2024-02-05 ENCOUNTER — Encounter: Payer: Self-pay | Admitting: Adult Health

## 2024-02-05 VITALS — BP 160/100 | HR 74 | Temp 97.6°F | Ht 71.5 in | Wt 258.0 lb

## 2024-02-05 DIAGNOSIS — I252 Old myocardial infarction: Secondary | ICD-10-CM

## 2024-02-05 DIAGNOSIS — E66812 Obesity, class 2: Secondary | ICD-10-CM

## 2024-02-05 DIAGNOSIS — I2510 Atherosclerotic heart disease of native coronary artery without angina pectoris: Secondary | ICD-10-CM

## 2024-02-05 DIAGNOSIS — Z0000 Encounter for general adult medical examination without abnormal findings: Secondary | ICD-10-CM | POA: Diagnosis not present

## 2024-02-05 DIAGNOSIS — R7303 Prediabetes: Secondary | ICD-10-CM | POA: Diagnosis not present

## 2024-02-05 DIAGNOSIS — I10 Essential (primary) hypertension: Secondary | ICD-10-CM | POA: Diagnosis not present

## 2024-02-05 DIAGNOSIS — E7849 Other hyperlipidemia: Secondary | ICD-10-CM | POA: Diagnosis not present

## 2024-02-05 DIAGNOSIS — R351 Nocturia: Secondary | ICD-10-CM

## 2024-02-05 DIAGNOSIS — N401 Enlarged prostate with lower urinary tract symptoms: Secondary | ICD-10-CM | POA: Diagnosis not present

## 2024-02-05 LAB — COMPREHENSIVE METABOLIC PANEL WITH GFR
ALT: 15 U/L (ref 0–53)
AST: 19 U/L (ref 0–37)
Albumin: 4.5 g/dL (ref 3.5–5.2)
Alkaline Phosphatase: 80 U/L (ref 39–117)
BUN: 13 mg/dL (ref 6–23)
CO2: 29 meq/L (ref 19–32)
Calcium: 9.8 mg/dL (ref 8.4–10.5)
Chloride: 98 meq/L (ref 96–112)
Creatinine, Ser: 1.15 mg/dL (ref 0.40–1.50)
GFR: 64.89 mL/min (ref >60.00)
Glucose, Bld: 106 mg/dL — ABNORMAL HIGH (ref 70–99)
Potassium: 4.1 meq/L (ref 3.5–5.1)
Sodium: 135 meq/L (ref 135–145)
Total Bilirubin: 0.9 mg/dL (ref 0.2–1.2)
Total Protein: 7.6 g/dL (ref 6.0–8.3)

## 2024-02-05 LAB — CBC
HCT: 42.7 % (ref 39.0–52.0)
Hemoglobin: 14.1 g/dL (ref 13.0–17.0)
MCHC: 33.1 g/dL (ref 30.0–36.0)
MCV: 89.6 fl (ref 78.0–100.0)
Platelets: 264 10*3/uL (ref 150.0–400.0)
RBC: 4.76 Mil/uL (ref 4.22–5.81)
RDW: 14 % (ref 11.5–15.5)
WBC: 8.1 10*3/uL (ref 4.0–10.5)

## 2024-02-05 LAB — LIPID PANEL
Cholesterol: 197 mg/dL (ref 0–200)
HDL: 42.1 mg/dL (ref >39.00)
LDL Cholesterol: 124 mg/dL — ABNORMAL HIGH (ref 0–99)
NonHDL: 154.7
Total CHOL/HDL Ratio: 5
Triglycerides: 152 mg/dL — ABNORMAL HIGH (ref 0.0–149.0)
VLDL: 30.4 mg/dL (ref 0.0–40.0)

## 2024-02-05 LAB — HEMOGLOBIN A1C: Hgb A1c MFr Bld: 5.8 % (ref 4.6–6.5)

## 2024-02-05 LAB — PSA: PSA: 3.46 ng/mL (ref 0.10–4.00)

## 2024-02-05 LAB — TSH: TSH: 1.81 u[IU]/mL (ref 0.35–5.50)

## 2024-02-05 NOTE — Progress Notes (Signed)
 Subjective:    Patient ID: Jacob Quinn, male    DOB: [birth year], 68 y.o.   MRN: [id number]  HPI Patient presents for yearly preventative medicine examination. He is a pleasant 68 year old male who  has a past medical history of 1st degree AV block, Back pain, BPH associated with nocturia, CAD in native artery, Encephalitis, viral, GERD (gastroesophageal reflux disease), Hyperlipidemia (08/07/2016), Hypertension, Ischemic cardiomyopathy, Joint pain, Myocardial infarction (HCC) (2017), OA (osteoarthritis), Obesity (BMI 30-39.9) (08/07/2016), Renal insufficiency, Restless leg syndrome (09/16/2010), Rheumatic fever, and Syncope (2009).  He also gets care at the [facility]  Obesity -is being seen at healthy weight wellness clinic.  Since starting this journey back in November 2023 he has lost roughly 20 pounds since started. He is feeling better overall. He is back to working out, he injured his back and had to get injections which caused him to stop going to the gym Wt Readings from Last 3 Encounters:  02/05/24 258 lb (117 kg)  01/01/24 251 lb (113.9 kg)  11/20/23 243 lb (110.2 kg)    BPH - managed with Flomax 0.4 mg - feels as though this medication works well to alleviate his symptoms. He missed the call for Urology and would like to have another referral to see them.   Pre diabetes - managed with Metformin 500 mg BID- he has not taken in the last month.  Lab Results  Component Value Date   HGBA1C 5.7 (H) 11/20/2023   HGBA1C 5.6 06/25/2023   HGBA1C 5.6 01/25/2023    Essential Hypertension -controlled with Norvasc 10mg  daily, and atenolol 25 mg daily.  He denies dizziness, lightheadedness, chest pain, or shortness of breath. He did not take his medication this morning. He does check periodically at home with readings in the 130/80's.   BP Readings from Last 3 Encounters:  02/05/24 (!) 160/100  01/01/24 (!) 162/83  11/20/23 138/80   Hyperlipidemia -managed with Lipitor 80 mg daily.  He  denies myalgia or fatigue. He has not taken his medication in the last month.  Lab Results  Component Value Date   CHOL 176 11/20/2023   HDL 47 11/20/2023   LDLCALC 108 (H) 11/20/2023   LDLDIRECT 110.4 02/19/2007   TRIG 117 11/20/2023   CHOLHDL 3.7 11/20/2023    H/o NSTEMI in 2017 -had stent placed in the left circumflex artery in October 2017.  He denies symptoms such as chest pain, palpitations, lower extremity edema, or DOE   All immunizations and health maintenance protocols were reviewed with the patient and needed orders were placed.  Appropriate screening laboratory values were ordered for the patient including screening of hyperlipidemia, renal function and hepatic function. If indicated by BPH, a PSA was ordered.  Medication reconciliation,  past medical history, social history, problem list and allergies were reviewed in detail with the patient  Goals were established with regard to weight loss, exercise, and  diet in compliance with medications  He is overdue for 3 year follow up colonoscopy.    Review of Systems  Constitutional: Negative.   HENT: Negative.    Eyes: Negative.   Respiratory: Negative.    Cardiovascular: Negative.   Gastrointestinal: Negative.   Endocrine: Negative.   Genitourinary: Negative.   Musculoskeletal:  Positive for arthralgias and back pain.  Skin: Negative.   Allergic/Immunologic: Negative.   Neurological: Negative.   Hematological: Negative.   Psychiatric/Behavioral: Negative.    All other systems reviewed and are negative.  Past Medical History:  Diagnosis Date  1st degree AV block    Back pain    BPH associated with nocturia    CAD in native artery    a. NSTEMI 07/2016 -  LHC 08/07/16 showing 70% D1, 20% mLAD, segmental 95% then 75% OM stenosis, 85% acute marg, normal LVEDP - received PTCA/DES to OM.   Encephalitis, viral    GERD (gastroesophageal reflux disease)    Hyperlipidemia 08/07/2016   Hypertension    Ischemic  cardiomyopathy    a. LHC 07/2016 - mild mid anterolateral focal hypocontractility felt to be due to the circumflex stenosis, EF 50%.   Joint pain    Myocardial infarction (HCC) 2017   OA (osteoarthritis)    Obesity (BMI 30-39.9) 08/07/2016   Renal insufficiency    a. 2016 r/t dehydration/ACEI use.   Restless leg syndrome 09/16/2010   Rheumatic fever    Syncope 2009   associated with viral URI    Social History   Socioeconomic History   Marital status: Single    Spouse name: Not on file   Number of children: 3   Years of education: Not on file   Highest education level: Some college, no degree  Occupational History   Occupation: Public affairs consultant: [employer]    Comment: Ronette [employer]  Tobacco Use   Smoking status: Never   Smokeless tobacco: Never  Vaping Use   Vaping status: Never Used  Substance and Sexual Activity   Alcohol use: No   Drug use: No   Sexual activity: Yes  Other Topics Concern   Not on file  Social History Narrative   Lives with daughter   Is retired -    Social Drivers of Corporate investment banker Strain: Low Risk  (12/18/2023)   Overall Financial Resource Strain (CARDIA)    Difficulty of Paying Living Expenses: Not very hard  Food Insecurity: Food Insecurity Present (12/18/2023)   Hunger Vital Sign    Worried About Running Out of Food in the Last Year: Never true    Ran Out of Food in the Last Year: Sometimes true  Transportation Needs: No Transportation Needs (12/18/2023)   PRAPARE - Administrator, Civil Service (Medical): No    Lack of Transportation (Non-Medical): No  Physical Activity: Insufficiently Active (12/18/2023)   Exercise Vital Sign    Days of Exercise per Week: 3 days    Minutes of Exercise per Session: 30 min  Stress: Patient Declined (12/18/2023)   [employer] of Occupational Health - Occupational Stress Questionnaire    Feeling of Stress : Patient declined  Social Connections: Moderately  Isolated (12/18/2023)   Social Connection and Isolation Panel [NHANES]    Frequency of Communication with Friends and Family: More than three times a week    Frequency of Social Gatherings with Friends and Family: Three times a week    Attends Religious Services: 1 to 4 times per year    Active Member of Clubs or Organizations: No    Attends Banker Meetings: Not on file    Marital Status: Divorced  Intimate Partner Violence: Not At Risk (05/22/2022)   Humiliation, Afraid, Rape, and Kick questionnaire    Fear of Current or Ex-Partner: No    Emotionally Abused: No    Physically Abused: No    Sexually Abused: No    Past Surgical History:  Procedure Laterality Date   CARDIAC CATHETERIZATION N/A 08/07/2016   Procedure: Left Heart Cath and Coronary Angiography;  Surgeon:  Lennette Bihari, MD;  Location: [facility] INVASIVE CV [facility];  Service: Cardiovascular;  Laterality: N/A;   CARDIAC CATHETERIZATION N/A 08/07/2016   Procedure: Coronary Stent Intervention;  Surgeon: Lennette Bihari, MD;  Location: [facility] INVASIVE CV [facility];  Service: Cardiovascular;  Laterality: N/A;   COLONOSCOPY  05/21/2012   COLONOSCOPY  12/29/2019   POLYPECTOMY     REPLACEMENT TOTAL KNEE     left and right-  5 total    TONSILLECTOMY     TOTAL KNEE REVISION Left 11/19/2017   Procedure: LEFT TOTAL KNEE REVISION;  Surgeon: Gean Birchwood, MD;  Location: [facility] OR;  Service: Orthopedics;  Laterality: Left;   TOTAL KNEE REVISION Right 04/22/2018   Procedure: RIGHT TOTAL KNEE REVISION;  Surgeon: Gean Birchwood, MD;  Location: [facility] ORS;  Service: Orthopedics;  Laterality: Right;  block    Family History  Problem Relation Age of Onset   Hypertension Mother    Heart disease Mother    Obesity Mother    Prostate cancer Father    Dementia Father    Depression Brother    Hypertension Brother    Hypertension Brother    Stroke Brother    Colon cancer Neg Hx    Colon polyps Neg Hx    Esophageal cancer Neg Hx    Rectal cancer Neg Hx     Stomach cancer Neg Hx     Allergies  Allergen Reactions   Penicillins Anaphylaxis    *Has tolerated cephalosporins in the past*  Has patient had a PCN reaction causing immediate rash, facial/tongue/throat swelling, SOB or lightheadedness with hypotension: Yes Has patient had a PCN reaction causing severe rash involving mucus membranes or skin necrosis: No Has patient had a PCN reaction that required hospitalization: No Has patient had a PCN reaction occurring within the last 10 years: No If all of the above answers are "NO", then may proceed with Cephalosporin use.    Lisinopril     Cramp    Tizanidine Other (See Comments)    High fevers, shaking   Latex Rash    Current Outpatient Medications on File Prior to Visit  Medication Sig Dispense Refill   amLODipine (NORVASC) 10 MG tablet TAKE 1 TABLET BY MOUTH EVERY DAY 90 tablet 0   aspirin 81 MG tablet Take 81 mg by mouth daily.     atenolol (TENORMIN) 25 MG tablet Take 25 mg by mouth daily.     atorvastatin (LIPITOR) 80 MG tablet Take by mouth.     cyanocobalamin (VITAMIN B12) 500 MCG tablet Take 1 tablet (500 mcg total) by mouth daily.     metFORMIN (GLUCOPHAGE) 500 MG tablet 1 tab with lunch and 1 with dinner 60 tablet 1   Multiple Vitamin (MULTI VITAMIN DAILY) TABS Take 1 tablet by mouth daily.     nitroGLYCERIN (NITROSTAT) 0.4 MG SL tablet Place 1 tablet (0.4 mg total) under the tongue every 5 (five) minutes as needed for chest pain. Up to 3 doses. 25 tablet 3   tamsulosin (FLOMAX) 0.4 MG CAPS capsule Take 1 capsule (0.4 mg total) by mouth daily after breakfast. 90 capsule 0   Vitamin D, Ergocalciferol, (DRISDOL) 1.25 MG (50000 UNIT) CAPS capsule 1 tab twice wkly 8 capsule 1   No current facility-administered medications on file prior to visit.    BP (!) 160/100   Pulse 74   Temp 97.6 F (36.4 C) (Oral)   Ht 5' 11.5" (1.816 m)   Wt 258 lb (117 kg)   SpO2  95%   BMI 35.48 kg/m       Objective:   Physical Exam Vitals  and nursing note reviewed.  Constitutional:      General: He is not in acute distress.    Appearance: Normal appearance. He is obese. He is not ill-appearing.  HENT:     Head: Normocephalic and atraumatic.     Right Ear: Tympanic membrane, ear canal and external ear normal. There is no impacted cerumen.     Left Ear: Tympanic membrane, ear canal and external ear normal. There is no impacted cerumen.     Nose: Nose normal. No congestion or rhinorrhea.     Mouth/Throat:     Mouth: Mucous membranes are moist.     Pharynx: Oropharynx is clear.  Eyes:     Extraocular Movements: Extraocular movements intact.     Conjunctiva/sclera: Conjunctivae normal.     Pupils: Pupils are equal, round, and reactive to light.  Neck:     Vascular: No carotid bruit.  Cardiovascular:     Rate and Rhythm: Normal rate and regular rhythm.     Pulses: Normal pulses.     Heart sounds: No murmur heard.    No friction rub. No gallop.  Pulmonary:     Effort: Pulmonary effort is normal.     Breath sounds: Normal breath sounds.  Abdominal:     General: Abdomen is flat. Bowel sounds are normal. There is no distension.     Palpations: Abdomen is soft. There is no mass.     Tenderness: There is no abdominal tenderness. There is no guarding or rebound.     Hernia: No hernia is present.  Musculoskeletal:        General: Normal range of motion.     Cervical back: Normal range of motion and neck supple.  Lymphadenopathy:     Cervical: No cervical adenopathy.  Skin:    General: Skin is warm and dry.     Capillary Refill: Capillary refill takes less than 2 seconds.  Neurological:     General: No focal deficit present.     Mental Status: He is alert and oriented to person, place, and time.  Psychiatric:        Mood and Affect: Mood normal.        Behavior: Behavior normal.        Thought Content: Thought content normal.        Judgment: Judgment normal.       Assessment & Plan:  1. Routine general medical  examination at a health care facility (Primary) Today patient counseled on age appropriate routine health concerns for screening and prevention, each reviewed and up to date or declined. Immunizations reviewed and up to date or declined. Labs ordered and reviewed. Risk factors for depression reviewed and negative. Hearing function and visual acuity are intact. ADLs screened and addressed as needed. Functional ability and level of safety reviewed and appropriate. Education, counseling and referrals performed based on assessed risks today. Patient provided with a copy of personalized plan for preventive services. - Encouraged to call and schedule his colonoscopy  2. Class 2 obesity - Follow up with weight loss clinic  - Continue to work out and eat healthy  - Lipid panel; Future - TSH; Future - CBC; Future - Comprehensive metabolic panel with GFR; Future - Hemoglobin A1c; Future  3. BPH associated with nocturia - Continue with Flomax  - PSA; Future  4. Prediabetes - Restart Metformin  - Lipid panel; Future -  TSH; Future - CBC; Future - Comprehensive metabolic panel with GFR; Future - Hemoglobin A1c; Future  5. Essential hypertension - elevated in the office d/t not taking his medication yet today  - Lipid panel; Future - TSH; Future - CBC; Future - Comprehensive metabolic panel with GFR; Future  6. Other hyperlipidemia - Restart statin  - Lipid panel; Future - TSH; Future - CBC; Future - Comprehensive metabolic panel with GFR; Future  7. CAD, multiple vessel- 2 stents - Restart statin. Continue with ASA - Lipid panel; Future - TSH; Future - CBC; Future - Comprehensive metabolic panel with GFR; Future  8. H/o NSTEMI back in 2017 - Restart statin. Continue with ASA - Lipid panel; Future - TSH; Future - CBC; Future - Comprehensive metabolic panel with GFR; Future  Shirline Frees, NP

## 2024-02-12 ENCOUNTER — Encounter (INDEPENDENT_AMBULATORY_CARE_PROVIDER_SITE_OTHER): Payer: Self-pay | Admitting: Family Medicine

## 2024-02-12 ENCOUNTER — Ambulatory Visit (INDEPENDENT_AMBULATORY_CARE_PROVIDER_SITE_OTHER): Admitting: Family Medicine

## 2024-02-12 VITALS — BP 120/76 | HR 47 | Temp 97.6°F | Ht 72.0 in | Wt 247.0 lb

## 2024-02-12 DIAGNOSIS — Z6833 Body mass index (BMI) 33.0-33.9, adult: Secondary | ICD-10-CM | POA: Diagnosis not present

## 2024-02-12 DIAGNOSIS — E559 Vitamin D deficiency, unspecified: Secondary | ICD-10-CM

## 2024-02-12 DIAGNOSIS — R7303 Prediabetes: Secondary | ICD-10-CM

## 2024-02-12 DIAGNOSIS — E538 Deficiency of other specified B group vitamins: Secondary | ICD-10-CM

## 2024-02-12 DIAGNOSIS — I10 Essential (primary) hypertension: Secondary | ICD-10-CM

## 2024-02-12 DIAGNOSIS — Z6831 Body mass index (BMI) 31.0-31.9, adult: Secondary | ICD-10-CM

## 2024-02-12 MED ORDER — VITAMIN D (ERGOCALCIFEROL) 1.25 MG (50000 UNIT) PO CAPS: ORAL_CAPSULE | ORAL | 1 refills | Status: DC

## 2024-02-12 MED ORDER — METFORMIN HCL 500 MG PO TABS: ORAL_TABLET | ORAL | 1 refills | Status: DC

## 2024-02-12 MED ORDER — CYANOCOBALAMIN 500 MCG PO TABS: 500.0000 ug | ORAL_TABLET | Freq: Every day | ORAL | Status: DC

## 2024-02-12 NOTE — Progress Notes (Signed)
 Rae Bugler, D.O.  ABFM, ABOM Specializing in Clinical Bariatric Medicine  Office located at: [address] [place], [place]  [address]   Assessment and Plan:   Medications Discontinued During This Encounter  Medication Reason   metFORMIN  (GLUCOPHAGE ) 500 MG tablet Reorder   Vitamin D , Ergocalciferol , (DRISDOL ) 1.25 MG (50000 UNIT) CAPS capsule Reorder   cyanocobalamin  (VITAMIN B12) 500 MCG tablet Reorder     Meds ordered this encounter  Medications   metFORMIN  (GLUCOPHAGE ) 500 MG tablet    Sig: 1 tab with lunch and 1 with dinner    Dispense:  60 tablet    Refill:  1    30 d supply;  ** OV for RF **   Do not send RF request   Vitamin D , Ergocalciferol , (DRISDOL ) 1.25 MG (50000 UNIT) CAPS capsule    Sig: 1 tab twice wkly    Dispense:  8 capsule    Refill:  1   cyanocobalamin  (VITAMIN B12) 500 MCG tablet    Sig: Take 1 tablet (500 mcg total) by mouth daily.    FOR THE DISEASE OF OBESITY:  BMI 31.0-31.9,adult - current BMI 33.49 Morbid obesity (HCC)-start bmi 37.43 Assessment & Plan: Since last office visit on 01/01/2024 patient's  Muscle mass has increased by 1.6 lb. Fat mass has decreased by 5.8 lb. Total body water  has decreased by 6.8 lb.  Counseling done on how various foods will affect these numbers and how to maximize success  Total lbs lost to date: 29 lbs  Total weight loss percentage to date: 10.51%    Recommended Dietary Goals Ulrich is currently in the action stage of change. As such, his goal is to continue weight management plan.  He has agreed to: continue current plan   Behavioral Intervention We discussed the following today: continue to work on maintaining a reduced calorie state, getting the recommended amount of protein, incorporating whole foods, making healthy choices, staying well hydrated and practicing mindfulness when eating.  Additional resources provided today: Handout on Examples of Low Glycemic Index and Low Calorie Fruits &  Vegetables and Handout on FDA approved anti-obesity medications and adverse effects  Evidence-based interventions for health behavior change were utilized today including the discussion of self monitoring techniques, problem-solving barriers and SMART goal setting techniques.   Regarding patient's less desirable eating habits and patterns, we employed the technique of small changes.   Pt will specifically work on: n/a   Recommended Physical Activity Goals Mc has been advised to work up to 150 minutes of moderate intensity aerobic activity a week and strengthening exercises 2-3 times per week for cardiovascular health, weight loss maintenance and preservation of muscle mass.   He has agreed to : continue walking regimen and add 2-3 days a week of resistance training.   Pharmacotherapy Pt endorses having increased hunger and sweet cravings. Had a long discussion about the anticipated benefits and potential risks of weight loss injectables - pt will think about these medications further and inform us  of his decision next OV or so.    FOR ASSOCIATED CONDITIONS ADDRESSED TODAY:  Essential hypertension Assessment & Plan: Last 3 blood pressure readings in our office are as follows: BP Readings from Last 3 Encounters:  02/12/24 120/76  02/05/24 (!) 160/100  01/01/24 (!) 162/83   The ASCVD Risk score (Arnett DK, et al., 2019) failed to calculate for the following reasons:   Risk score cannot be calculated because patient has a medical history suggesting prior/existing ASCVD  Lab Results  Component Value Date   CREATININE 1.15 02/05/2024   Blood pressure is well-controlled today on Amlodipine  and Atenolol. At home his BP averages: 120s-130s/70s-80s.Continue adherence to medications and low sodium diet. Advance exercise as able; add resistance training 2-3 days a week.    Vitamin B 12 deficiency Assessment & Plan: Lab Results  Component Value Date   VITAMINB12 497 11/20/2023    Most recent Vitamin B12 above. States that he not been taking his OTC cyanocobalamin  500 mcg daily. Encouraged pt to take it consistently. Recheck - future.    Prediabetes Assessment & Plan: Lab Results  Component Value Date   HGBA1C 5.8 02/05/2024   HGBA1C 5.7 (H) 11/20/2023   HGBA1C 5.6 06/25/2023   INSULIN  7.4 01/25/2023   INSULIN  26.5 (H) 09/19/2022    Most recent Hemoglobin A1c and fasting insulin  as above. Pt is on a regimen of Metformin  500 mg twice daily. Denies GI issues. Reports still experiencing sweet cravings. Pt will work on increasing protein intake for craving suppression and reducing simple and added sugars in his diet. Continue Metformin  therapy - consider starting AOM in the future.    Vitamin D  deficiency Assessment & Plan: Lab Results  Component Value Date   VD25OH 25.0 (L) 11/20/2023   VD25OH 26.6 (L) 06/25/2023   VD25OH 33.4 01/25/2023   Most recent Vitamin D  as above. Reports compliance with high dose vitamin D  twice weekly. Continue regimen - recheck periodically.   Follow up:   Return 03/18/2024 with Dr.U. He was informed of the importance of frequent follow up visits to maximize his success with intensive lifestyle modifications for his multiple health conditions.  Subjective:   Chief complaint: Obesity Jarl is here to discuss his progress with his obesity treatment plan. He is on the Category 3 Plan and states he is following his eating plan approximately 50% of the time. He states he is walking 30-60 minutes 4 days per week.  Interval History:  DORRANCE SELLICK is here for a follow up office visit. Since last OV on 01/01/2024, Mr.Schlechter is down 4 lbs. Reports increased adherence to his meal plan compared to LOV. States he's still experiencing hunger and sweet cravings. He expresses some interest in weight loss injectables.   Pharmacotherapy that aid w/ wt loss: He is currently taking  Metformin  500 mg twice daily .   Review of Systems:   Pertinent positives were addressed with patient today.  Reviewed by clinician on day of visit: allergies, medications, problem list, medical history, surgical history, family history, social history, and previous encounter notes.  Weight Summary and Biometrics   Weight Lost Since Last Visit: 4lb  Weight Gained Since Last Visit: 0   Vitals Temp: 97.6 F (36.4 C) BP: 120/76 Pulse Rate: (!) 47 SpO2: 96 %   Anthropometric Measurements Height: 6' (1.829 m) Weight: 247 lb (112 kg) BMI (Calculated): 33.49 Weight at Last Visit: 251lb Weight Lost Since Last Visit: 4lb Weight Gained Since Last Visit: 0 Starting Weight: 276lb Total Weight Loss (lbs): 29 lb (13.2 kg)   Body Composition  Body Fat %: 34 % Fat Mass (lbs): 84 lbs Muscle Mass (lbs): 155.2 lbs Total Body Water  (lbs): 114.8 lbs Visceral Fat Rating : 21   Other Clinical Data Fasting: yes Labs: no Today's Visit #: 17 Starting Date: 09/19/22   Objective:   PHYSICAL EXAM: Blood pressure 120/76, pulse (!) 47, temperature 97.6 F (36.4 C), height 6' (1.829 m), weight 247 lb (112 kg), SpO2 96%. Body  mass index is 33.5 kg/m.  General: he is overweight, cooperative and in no acute distress. PSYCH: Has normal mood, affect and thought process.   HEENT: EOMI, sclerae are anicteric. Lungs: Normal breathing effort, no conversational dyspnea. Extremities: Moves * 4 Neurologic: A and O * 3, good insight  DIAGNOSTIC DATA REVIEWED: BMET    Component Value Date/Time   NA 135 02/05/2024 1116   NA 140 11/20/2023 0956   K 4.1 02/05/2024 1116   CL 98 02/05/2024 1116   CO2 29 02/05/2024 1116   GLUCOSE 106 (H) 02/05/2024 1116   BUN 13 02/05/2024 1116   BUN 9 11/20/2023 0956   CREATININE 1.15 02/05/2024 1116   CALCIUM  9.8 02/05/2024 1116   GFRNONAA >60 04/23/2018 0544   GFRAA >60 04/23/2018 0544   Lab Results  Component Value Date   HGBA1C 5.8 02/05/2024   HGBA1C 5.6 01/30/2015   Lab Results  Component Value  Date   INSULIN  7.4 01/25/2023   INSULIN  26.5 (H) 09/19/2022   Lab Results  Component Value Date   TSH 1.81 02/05/2024   CBC    Component Value Date/Time   WBC 8.1 02/05/2024 1116   RBC 4.76 02/05/2024 1116   HGB 14.1 02/05/2024 1116   HGB 14.7 11/20/2023 0956   HCT 42.7 02/05/2024 1116   HCT 44.9 11/20/2023 0956   PLT 264.0 02/05/2024 1116   PLT 289 11/20/2023 0956   MCV 89.6 02/05/2024 1116   MCV 90 11/20/2023 0956   MCH 29.6 11/20/2023 0956   MCH 27.8 04/23/2018 0544   MCHC 33.1 02/05/2024 1116   RDW 14.0 02/05/2024 1116   RDW 13.9 11/20/2023 0956   Iron Studies No results found for: "IRON", "TIBC", "FERRITIN", "IRONPCTSAT" Lipid Panel     Component Value Date/Time   CHOL 197 02/05/2024 1116   CHOL 176 11/20/2023 0956   TRIG 152.0 (H) 02/05/2024 1116   HDL 42.10 02/05/2024 1116   HDL 47 11/20/2023 0956   CHOLHDL 5 02/05/2024 1116   VLDL 30.4 02/05/2024 1116   LDLCALC 124 (H) 02/05/2024 1116   LDLCALC 108 (H) 11/20/2023 0956   LDLDIRECT 110.4 02/19/2007 0819   Hepatic Function Panel     Component Value Date/Time   PROT 7.6 02/05/2024 1116   PROT 7.4 11/20/2023 0956   ALBUMIN 4.5 02/05/2024 1116   ALBUMIN 4.4 11/20/2023 0956   AST 19 02/05/2024 1116   ALT 15 02/05/2024 1116   ALKPHOS 80 02/05/2024 1116   BILITOT 0.9 02/05/2024 1116   BILITOT 0.5 11/20/2023 0956   BILIDIR 0.1 02/06/2017 0808      Component Value Date/Time   TSH 1.81 02/05/2024 1116   Nutritional Lab Results  Component Value Date   VD25OH 25.0 (L) 11/20/2023   VD25OH 26.6 (L) 06/25/2023   VD25OH 33.4 01/25/2023    Attestations:   I, Special Puri, acting as a Stage manager for Marceil Sensor, DO., have compiled all relevant documentation for today's office visit on behalf of Marceil Sensor, DO, while in the presence of [employer], DO.  Reviewed by clinician on day of visit: allergies, medications, problem list, medical history, surgical history, family history, social  history, and previous encounter notes pertinent to patient's obesity diagnosis.   I have reviewed the above documentation for accuracy and completeness, and I agree with the above. Rae Bugler, D.O.  The 21st Century Cures Act was signed into law in 2016 which includes the topic of electronic health records.  This provides immediate access to information  in MyChart.  This includes consultation notes, operative notes, office notes, lab results and pathology reports.  If you have any questions about what you read please let us  know at your next visit so we can discuss your concerns and take corrective action if need be.  We are right here with you.

## 2024-02-22 ENCOUNTER — Ambulatory Visit: Admitting: Physical Medicine and Rehabilitation

## 2024-02-22 ENCOUNTER — Encounter: Payer: Self-pay | Admitting: Physical Medicine and Rehabilitation

## 2024-02-22 VITALS — BP 150/85 | HR 60

## 2024-02-22 DIAGNOSIS — M47819 Spondylosis without myelopathy or radiculopathy, site unspecified: Secondary | ICD-10-CM | POA: Diagnosis not present

## 2024-02-22 DIAGNOSIS — M47816 Spondylosis without myelopathy or radiculopathy, lumbar region: Secondary | ICD-10-CM

## 2024-02-22 DIAGNOSIS — G8929 Other chronic pain: Secondary | ICD-10-CM | POA: Diagnosis not present

## 2024-02-22 DIAGNOSIS — M545 Low back pain, unspecified: Secondary | ICD-10-CM

## 2024-02-22 MED ORDER — TRAMADOL HCL 50 MG PO TABS: 50.0000 mg | ORAL_TABLET | Freq: Three times a day (TID) | ORAL | 0 refills | Status: AC | PRN

## 2024-02-22 NOTE — Progress Notes (Signed)
 Patient says that his back pain is the same, maybe a bit worse, since his last visit in November. He says that he takes Tylenol  occasionally with little relief. His pain is in his low back, and he denies any pain, numbness, or tingling in the legs. He says that his pain is constant but it will flare up at times to be worse; he does not notice a pattern with these flare-ups of pain and says that they come about randomly.

## 2024-02-22 NOTE — Progress Notes (Signed)
 Jacob Quinn - 68 y.o. male MRN [id number]  Date of birth: Aug 22, 1956  Office Visit Note: Visit Date: 02/22/2024 PCP: Alto Atta, NP Referred by: Alto Atta, NP  Subjective: Chief Complaint  Patient presents with   Lower Back - Pain   HPI: Jacob Quinn is a 68 y.o. male who comes in today for evaluation of chronic, worsening and severe let sided lower back pain. Pain ongoing for several years, worsens with prolonged sitting and standing. Describes pain as sharp and aching sensation, currently rates as 7 out of 10. Some relief of pain with home exercise regimen, rest and use of medications. No history of formal physical therapy. Lumbar MRI imaging from 2016 shows multi level facet arthropathy, moderate multi factorial spinal canal stenosis at L3-L4 and posterior disc herniation on the left at L4-L5. Patient underwent left L3 and L4 transforaminal epidural steroid injection in our office on 12/06/2021, he reports greater than 80% relief of pain with this procedure for 4 months. Also reports increased functional ability post injection. Patient is managed from orthopedic standpoint by Dr. Lynita Saris. He denies focal weakness, numbness and tingling. No recent trauma or falls.   Patients course is complicated by ischemic cardiomyopathy, CAD with coronary angioplasty.      Review of Systems  Musculoskeletal:  Positive for back pain and myalgias.  Neurological:  Negative for tingling, sensory change, focal weakness and weakness.  All other systems reviewed and are negative.  Otherwise per HPI.  Assessment & Plan: Visit Diagnoses:    ICD-10-CM   1. Chronic left-sided low back pain without sciatica  M54.50 MR LUMBAR SPINE WO CONTRAST   G89.29     2. Spondylosis without myelopathy or radiculopathy  M47.819 MR LUMBAR SPINE WO CONTRAST    3. Facet arthropathy, lumbar  M47.816 MR LUMBAR SPINE WO CONTRAST       Plan: Findings:  Chronic, worsening and severe left sided lower  back pain. No radicular symptoms down the legs. Patient continues to have severe pain despite good conservative therapies such as home exercise regimen, rest and use of medications. Patients clinical presentation and exam are complex, differentials include facet arthropathy vs neurogenic claudication as a result of spinal canal stenosis. MRI imaging of lumbar spine from 2016 shows multi level facet arthropathy and moderate spinal canal stenosis at the level of L3-L4. We discussed treatment plan in detail today. Next step is to obtain new lumbar MRI imaging. Depending on results of MRI imaging we discussed possibility of performing lumbar injections. I also discussed medication management and prescribed short course of Tramadol . I will see him back for lumbar MRI review. Patient denies focal weakness, numbness and tingling. No recent trauma or falls.     Meds & Orders:  Meds ordered this encounter  Medications   traMADol  (ULTRAM ) 50 MG tablet    Sig: Take 1 tablet (50 mg total) by mouth every 8 (eight) hours as needed.    Dispense:  20 tablet    Refill:  0    Orders Placed This Encounter  Procedures   MR LUMBAR SPINE WO CONTRAST    Follow-up: Return for Lumbar MRI review.   Procedures: No procedures performed      Clinical History: INDICATION: low back pain , eval for hnp or spinal stenosis . Tingling and weakness for 5 months.  STUDY: MRI of the lumbar spine without intravenous contrast performed on 06/03/2015 6:30 PM. No comparison available.  TECHNIQUE: Multiplanar, multisequence MR imaging obtained through the  lumbar spine without contrast on 06/03/2015 6:30 PM.  FINDINGS: #  Osseous structures: Endplate degenerative changes with anterior lateral osteophytosis at L2-L3 to L4-L5. There is reactive marrow edema at L3-L4 and more so at L4-L5. Vertebral body heights are well-maintained without evidence of an acute fracture. #  Alignment:Alignment maintained. #  Conus medullaris/cauda  equina: Spinal cord signal is normal and terminates at L1.  #  Lower thoracic spine: Mild degenerative disc disease without any significant spinal canal or neural foraminal compromise. This is viewed on sagittal images only.  #  T12-L1: Mild degenerative disc disease without any significant disc herniation, spinal canal or neural foraminal compromise. This is viewed on the sagittal images only. #  L1-L2: Mild degenerative disc disease with a minimal disc bulge. Spinal canal is patent. Foramen are patent. This is viewed on the sagittal images only. #  L2-L3: Degenerative disc disease with mild loss of disc height and a disc bulge. Vacuum disc phenomenon appreciated. There is ligamentum flavum flavum redundancy with mild facet hypertrophy on the right. This produces mild spinal canal and lateral recess stenosis. There is mild bilateral neural foraminal compromise. #  L3-L4: Degenerative disc disease with moderate loss of disc height and a disc bulge. Vacuum disc phenomena. There is bilateral facet hypertrophy with ligamentum flavum redundancy. This produces moderate spinal canal and lateral recess stenosis. There is moderate left and mild right neural foraminal stenosis. #  L4-L5: Degenerative disc disease with mild loss of disc height. Vacuum disc phenomena. There is a posterior disc herniation that is more pronounced on the left. Bilateral facet hypertrophy with ligamentum flavum redundancy. There is a small left facet  joint effusion. Mild spinal canal and lateral recess stenosis. There is severe left and mild right neural foraminal stenosis. #  L5-S1: Degenerative disc disease with bilateral facet hypertrophy. No significant disc herniation, spinal canal or neural foraminal compromise.  #  Paraspinal tissues: Unremarkable.  #  Additional comments: None.   IMPRESSION: 1.  Degenerative disc disease and facet arthrosis as described above. This is most notable for severe left and mild spinal canal,  lateral recess and right neural foraminal stenosis at L4-L5. 2.  Moderate spinal canal and lateral recess stenosis with moderate left and mild right neural foraminal stenosis at L3-L4.  Electronically Signed by Nona Bayard Exam End: -  Specimen Collected: 06/03/15 17:30 Last Resulted: 06/04/15 08:43   He reports that he has never smoked. He has never used smokeless tobacco.  Recent Labs    06/25/23 1228 11/20/23 0956 02/05/24 1116  HGBA1C 5.6 5.7* 5.8    Objective:  VS:  HT:    WT:   BMI:     BP:(!) 150/85  HR:60bpm  TEMP: ( )  RESP:  Physical Exam Vitals and nursing note reviewed.  HENT:     Head: Normocephalic and atraumatic.     Right Ear: External ear normal.     Left Ear: External ear normal.     Nose: Nose normal.     Mouth/Throat:     Mouth: Mucous membranes are moist.  Eyes:     Extraocular Movements: Extraocular movements intact.  Cardiovascular:     Rate and Rhythm: Normal rate.     Pulses: Normal pulses.  Pulmonary:     Effort: Pulmonary effort is normal.  Abdominal:     General: Abdomen is flat. There is no distension.  Musculoskeletal:        General: Tenderness present.     Cervical back: Normal  range of motion.     Comments: Patient rises from seated position to standing without difficulty. Pain noted with facet loading and lumbar extension. 5/5 strength noted with bilateral hip flexion, knee flexion/extension, ankle dorsiflexion/plantarflexion and EHL. No clonus noted bilaterally. No pain upon palpation of greater trochanters. No pain with internal/external rotation of bilateral hips. Sensation intact bilaterally. Myofascial tenderness noted upon palpation of left lumbar paraspinal region. Negative slump test bilaterally. Ambulates without aid, gait steady.     Skin:    General: Skin is warm and dry.     Capillary Refill: Capillary refill takes less than 2 seconds.  Neurological:     General: No focal deficit present.     Mental Status: He is alert  and oriented to person, place, and time.  Psychiatric:        Mood and Affect: Mood normal.        Behavior: Behavior normal.     Ortho Exam  Imaging: No results found.  Past Medical/Family/Surgical/Social History: Medications & Allergies reviewed per EMR, new medications updated. Patient Active Problem List   Diagnosis Date Noted   Nocturnal leg cramps 01/25/2023   BMI 32.0-32.9,adult Current BMI 32.1 01/25/2023   Morbid obesity (HCC)-start bmi 37.43 12/13/2022   BMI 33.0-33.9,adult-current bmi 33.0 12/13/2022   Prediabetes 10/03/2022   Other hyperlipidemia 10/03/2022   Vitamin D  deficiency 10/03/2022   Other fatigue 09/19/2022   SOB (shortness of breath) 09/19/2022   Generalized obesity 09/19/2022   CAD (coronary artery disease) 12/07/2021   Syncope and collapse 05/01/2018   Primary osteoarthritis of right knee 04/22/2018   Pain due to unicompartmental arthroplasty of knee (HCC) 04/19/2018   Post op infection 11/22/2017   Primary osteoarthritis of left knee 11/19/2017   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 NSTEMI 07/2016 -  LHC 08/07/16 showing 70% D1, 20% mLAD, segmental 95% then 75% OM stenosis, 85% acute marg, normal LVEDP - received PTCA/DES to OM.   Encephalitis, viral    GERD (gastroesophageal reflux disease)    Hyperlipidemia 08/07/2016   Hypertension    Ischemic cardiomyopathy    a. LHC 07/2016 - mild  mid anterolateral focal hypocontractility felt to be due to the circumflex stenosis, EF 50%.   Joint pain    Myocardial infarction (HCC) 2017   OA (osteoarthritis)    Obesity (BMI 30-39.9) 08/07/2016   Renal insufficiency    a. 2016 r/t dehydration/ACEI use.   Restless leg syndrome 09/16/2010   Rheumatic fever    Syncope 2009   associated with viral URI   Family History  Problem Relation Age of Onset   Hypertension Mother    Heart disease Mother    Obesity Mother    Prostate cancer Father    Dementia Father    Depression Brother    Hypertension Brother    Hypertension Brother    Stroke Brother    Colon cancer  Neg Hx    Colon polyps Neg Hx    Esophageal cancer Neg Hx    Rectal cancer Neg Hx    Stomach cancer Neg Hx    Past Surgical History:  Procedure Laterality Date   CARDIAC CATHETERIZATION N/A 08/07/2016   Procedure: Left Heart Cath and Coronary Angiography;  Surgeon: Millicent Ally, MD;  Location: [facility];  Service: Cardiovascular;  Laterality: N/A;   CARDIAC CATHETERIZATION N/A 08/07/2016   Procedure: Coronary Stent Intervention;  Surgeon: Millicent Ally, MD;  Location: [facility];  Service: Cardiovascular;  Laterality: N/A;   COLONOSCOPY  05/21/2012   COLONOSCOPY  12/29/2019   POLYPECTOMY     REPLACEMENT TOTAL KNEE     left and right-  5 total    TONSILLECTOMY     TOTAL KNEE REVISION Left 11/19/2017   Procedure: LEFT TOTAL KNEE REVISION;  Surgeon: Wendolyn Hamburger, MD;  Location: [facility];  Service: Orthopedics;  Laterality: Left;   TOTAL KNEE REVISION Right 04/22/2018   Procedure: RIGHT TOTAL KNEE REVISION;  Surgeon: Wendolyn Hamburger, MD;  Location: [facility];  Service: Orthopedics;  Laterality: Right;  block   Social History   Occupational History   Occupation: Public affairs consultant: [employer]    Comment: [employer]  Tobacco Use   Smoking status: Never   Smokeless tobacco: Never  Vaping Use   Vaping status: Never Used  Substance and  Sexual Activity   Alcohol use: No   Drug use: No   Sexual activity: Yes

## 2024-02-25 ENCOUNTER — Telehealth: Payer: Self-pay | Admitting: Cardiovascular Disease

## 2024-02-25 NOTE — Telephone Encounter (Signed)
 Spoke with pt, aware his stent is in the circumflex marginal vessel.

## 2024-02-25 NOTE — Telephone Encounter (Signed)
 New Message:       Patient said he had a stent put in years ago He can not remember where the stent was put He wants to know where is his stent in his body?

## 2024-02-27 NOTE — Telephone Encounter (Signed)
 Patient stated he will need a letter showing the placement of his stent.  Patient would like to pick up the letter when it is available.

## 2024-02-27 NOTE — Telephone Encounter (Signed)
 Spoke with patient and he states that he will need a letter with provider signature stating placement of stents before they will do his MRI. He states he was informed that usually when stents are placed you get a card but being that he does not have a card he will need letter from provider.

## 2024-03-09 ENCOUNTER — Other Ambulatory Visit: Payer: Self-pay | Admitting: Adult Health

## 2024-03-09 DIAGNOSIS — N401 Enlarged prostate with lower urinary tract symptoms: Secondary | ICD-10-CM

## 2024-03-18 ENCOUNTER — Ambulatory Visit (INDEPENDENT_AMBULATORY_CARE_PROVIDER_SITE_OTHER): Admitting: Family Medicine

## 2024-03-21 NOTE — Telephone Encounter (Signed)
 Matter given to TK weeks ago. Waiting for him to respond.

## 2024-03-25 NOTE — Telephone Encounter (Signed)
 Pt requesting a update in regards to information for his stent placement. Please advise

## 2024-03-25 NOTE — Telephone Encounter (Signed)
 Dr. Loetta Ringer is currently out of the office.

## 2024-04-09 ENCOUNTER — Other Ambulatory Visit: Payer: Self-pay | Admitting: Adult Health

## 2024-04-15 ENCOUNTER — Encounter: Payer: Self-pay | Admitting: Physical Medicine and Rehabilitation

## 2024-04-18 ENCOUNTER — Ambulatory Visit
Admission: RE | Admit: 2024-04-18 | Discharge: 2024-04-18 | Disposition: A | Discharge status: Home / Self Care | Source: Ambulatory Visit | Attending: Physical Medicine and Rehabilitation | Admitting: Physical Medicine and Rehabilitation

## 2024-04-18 DIAGNOSIS — M545 Low back pain, unspecified: Secondary | ICD-10-CM

## 2024-04-18 DIAGNOSIS — M48061 Spinal stenosis, lumbar region without neurogenic claudication: Secondary | ICD-10-CM | POA: Diagnosis not present

## 2024-04-18 DIAGNOSIS — M47819 Spondylosis without myelopathy or radiculopathy, site unspecified: Secondary | ICD-10-CM

## 2024-04-18 DIAGNOSIS — G8929 Other chronic pain: Secondary | ICD-10-CM

## 2024-04-18 DIAGNOSIS — M47816 Spondylosis without myelopathy or radiculopathy, lumbar region: Secondary | ICD-10-CM | POA: Diagnosis not present

## 2024-04-28 ENCOUNTER — Encounter (INDEPENDENT_AMBULATORY_CARE_PROVIDER_SITE_OTHER): Payer: Self-pay | Admitting: Family Medicine

## 2024-04-28 ENCOUNTER — Ambulatory Visit (INDEPENDENT_AMBULATORY_CARE_PROVIDER_SITE_OTHER): Admitting: Family Medicine

## 2024-04-28 VITALS — BP 137/80 | HR 74 | Temp 98.1°F | Ht 72.0 in | Wt 254.0 lb

## 2024-04-28 DIAGNOSIS — R7303 Prediabetes: Secondary | ICD-10-CM

## 2024-04-28 DIAGNOSIS — I10 Essential (primary) hypertension: Secondary | ICD-10-CM | POA: Diagnosis not present

## 2024-04-28 DIAGNOSIS — E559 Vitamin D deficiency, unspecified: Secondary | ICD-10-CM | POA: Diagnosis not present

## 2024-04-28 DIAGNOSIS — E538 Deficiency of other specified B group vitamins: Secondary | ICD-10-CM

## 2024-04-28 DIAGNOSIS — Z6834 Body mass index (BMI) 34.0-34.9, adult: Secondary | ICD-10-CM

## 2024-04-28 MED ORDER — VITAMIN D (ERGOCALCIFEROL) 1.25 MG (50000 UNIT) PO CAPS: ORAL_CAPSULE | ORAL | 1 refills | Status: DC

## 2024-04-28 MED ORDER — CYANOCOBALAMIN 500 MCG PO TABS: 500.0000 ug | ORAL_TABLET | Freq: Every day | ORAL | Status: DC

## 2024-04-28 MED ORDER — METFORMIN HCL 500 MG PO TABS
500.0000 mg | ORAL_TABLET | Freq: Two times a day (BID) | ORAL | 1 refills | Status: DC
Start: 2024-04-28 — End: 2024-06-25

## 2024-04-28 NOTE — Progress Notes (Signed)
 SUBJECTIVE:  Chief Complaint: Obesity  Interim History: Patient normally sees Dr. Midge and last appointment was in mid April.  He mentions he has been in the hospital and has an upcoming appointment to get his back injected.  He previously was on Category 3 plan.   Jacob Quinn is here to discuss his progress with his obesity treatment plan. He is on the Category 2 Plan and states he is not following his eating plan approximately 0 % of the time. He states he is not exercising 0 minutes 0 times per week.   OBJECTIVE: Visit Diagnoses: Problem List Items Addressed This Visit       Cardiovascular and Mediastinum   Essential hypertension (Chronic)     Other   Prediabetes - Primary   Relevant Medications   metFORMIN  (GLUCOPHAGE ) 500 MG tablet   Vitamin D  deficiency   Relevant Medications   Vitamin D , Ergocalciferol , (DRISDOL ) 1.25 MG (50000 UNIT) CAPS capsule   Morbid obesity (HCC)-start bmi 37.43   Relevant Medications   metFORMIN  (GLUCOPHAGE ) 500 MG tablet   Other Visit Diagnoses       Vitamin B 12 deficiency       Relevant Medications   cyanocobalamin  (VITAMIN B12) 500 MCG tablet     BMI 34.0-34.9,adult           No data recorded      04/28/2024    9:00 AM 02/22/2024    9:45 AM 02/12/2024   10:00 AM  Vitals with BMI  Height 6' 0  6' 0  Weight 254 lbs  247 lbs  BMI 34.44  33.49  Systolic 137 150 879  Diastolic 80 85 76  Pulse 74 60 47       ASSESSMENT AND PLAN: Assessment & Plan Prediabetes Last A1c of 5.8 on 02/05/24.  Working on limiting simple carbohydrates and is taking metformin .  Needs refill of metformin  today.  No change in dose.   Essential hypertension Blood pressure well controlled today.  No chest pain, chest pressure or headache.  Continue to monitor BP at follow up appointments. Vitamin D  deficiency Discussed importance of vitamin d  supplementation.  Vitamin d  supplementation has been shown to decrease fatigue, decrease risk of progression to  insulin  resistance and then prediabetes, decreases risk of falling in older age and can even assist in decreasing depressive symptoms in PTSD.   Refill for Vitamin D  sent in.   Vitamin B 12 deficiency B12 level lower end of normal and patient is on supplementation.  Needs refill of supplement today.   BMI 34.0-34.9,adult  Morbid obesity (HCC)-start bmi 37.43 Anthropometric Measurements Height: 6' (1.829 m) Weight: 254 lb (115.2 kg) BMI (Calculated): 34.44 Weight at Last Visit: 247lb Weight Lost Since Last Visit: 0 Weight Gained Since Last Visit: 7lb Starting Weight: 276lb Total Weight Loss (lbs): 22 lb (9.979 kg) Body Composition  Body Fat %: 34.8 % Fat Mass (lbs): 88.6 lbs Muscle Mass (lbs): 158 lbs Total Body Water  (lbs): 121.8 lbs Visceral Fat Rating : 21 Other Clinical Data Fasting: no Labs: no Today's Visit #: 18 Starting Date: 09/19/22    Diet: Arjen is currently in the action stage of change. As such, his goal is to continue with weight loss efforts and has agreed to the Category 3 Plan.  Ultimate goal should be 2 meals on plan 5 days a week.  He was given handout packet for Category 3 today.   Exercise:  Older adults should follow the adult guidelines. When older adults cannot  meet the adult guidelines, they should be as physically active as their abilities and conditions will allow.  Behavior Modification:  We discussed the following Behavioral Modification Strategies today: increasing lean protein intake, decreasing simple carbohydrates, increasing vegetables, and better snacking choices.   Return in about 3 weeks (around 05/19/2024) for With Dr. MALVA, repeat IC.   He was informed of the importance of frequent follow up visits to maximize his success with intensive lifestyle modifications for his multiple health conditions.  Attestation Statements:   Reviewed by clinician on day of visit: allergies, medications, problem list, medical history, surgical history,  family history, social history, and previous encounter notes.     Jacob Cho, MD

## 2024-05-04 NOTE — Assessment & Plan Note (Signed)
 Anthropometric Measurements Height: 6' (1.829 m) Weight: 254 lb (115.2 kg) BMI (Calculated): 34.44 Weight at Last Visit: 247lb Weight Lost Since Last Visit: 0 Weight Gained Since Last Visit: 7lb Starting Weight: 276lb Total Weight Loss (lbs): 22 lb (9.979 kg) Body Composition  Body Fat %: 34.8 % Fat Mass (lbs): 88.6 lbs Muscle Mass (lbs): 158 lbs Total Body Water  (lbs): 121.8 lbs Visceral Fat Rating : 21 Other Clinical Data Fasting: no Labs: no Today's Visit #: 18 Starting Date: 09/19/22

## 2024-05-04 NOTE — Assessment & Plan Note (Signed)
 Last A1c of 5.8 on 02/05/24.  Working on limiting simple carbohydrates and is taking metformin .  Needs refill of metformin  today.  No change in dose.

## 2024-05-04 NOTE — Assessment & Plan Note (Signed)
 Discussed importance of vitamin d supplementation.  Vitamin d supplementation has been shown to decrease fatigue, decrease risk of progression to insulin resistance and then prediabetes, decreases risk of falling in older age and can even assist in decreasing depressive symptoms in PTSD.   Refill for Vitamin D sent in.

## 2024-05-04 NOTE — Assessment & Plan Note (Signed)
 Blood pressure well controlled today.  No chest pain, chest pressure or headache.  Continue to monitor BP at follow up appointments.

## 2024-05-16 ENCOUNTER — Ambulatory Visit: Admitting: Physical Medicine and Rehabilitation

## 2024-05-16 ENCOUNTER — Encounter: Payer: Self-pay | Admitting: Physical Medicine and Rehabilitation

## 2024-05-16 DIAGNOSIS — G8929 Other chronic pain: Secondary | ICD-10-CM

## 2024-05-16 DIAGNOSIS — M5441 Lumbago with sciatica, right side: Secondary | ICD-10-CM | POA: Diagnosis not present

## 2024-05-16 DIAGNOSIS — M48062 Spinal stenosis, lumbar region with neurogenic claudication: Secondary | ICD-10-CM | POA: Diagnosis not present

## 2024-05-16 DIAGNOSIS — M5442 Lumbago with sciatica, left side: Secondary | ICD-10-CM | POA: Diagnosis not present

## 2024-05-16 DIAGNOSIS — M5416 Radiculopathy, lumbar region: Secondary | ICD-10-CM | POA: Diagnosis not present

## 2024-05-16 NOTE — Progress Notes (Signed)
 Jacob Quinn - 68 y.o. male MRN [id number]  Date of birth: 1956-05-12  Office Visit Note: Visit Date: 05/16/2024 PCP: Merna Huxley, NP Referred by: Merna Huxley, NP  Subjective: Chief Complaint  Patient presents with   Lower Back - Pain   HPI: Jacob Quinn is a 68 y.o. male who comes in today for evaluation of chronic, worsening and severe bilateral lower back pain. Intermittent pain radiating to buttocks. Pain ongoing for several years, worsens with prolonged standing and walking. He describes his pain as sore and aching sensation, currently rates as 7 out of 10. Some relief of pain with home exercise regimen, rest and use of medications. No history of formal physical therapy. Recent lumbar MRI imaging shows straightening with minimal convex right scoliosis. moderate multi factorial spinal canal stenosis at L4-L5. History of left L3 and L4 transforaminal epidural steroid injection in our office on 12/06/2021, he reports greater than 80% relief of pain for greater than 4 months. Patient denies focal weakness, numbness and tingling. No recent trauma or falls.      Review of Systems  Musculoskeletal:  Positive for back pain.  Neurological:  Negative for tingling, sensory change, focal weakness and weakness.  All other systems reviewed and are negative.  Otherwise per HPI.  Assessment & Plan: Visit Diagnoses:    ICD-10-CM   1. Chronic bilateral low back pain with bilateral sciatica  M54.42 Ambulatory referral to Physical Medicine Rehab   M54.41 Ambulatory referral to Physical Therapy   G89.29     2. Radiculopathy, lumbar region  M54.16 Ambulatory referral to Physical Medicine Rehab    Ambulatory referral to Physical Therapy    3. Spinal stenosis of lumbar region with neurogenic claudication  M48.062 Ambulatory referral to Physical Medicine Rehab    Ambulatory referral to Physical Therapy       Plan: Findings:  Chronic, worsening and severe bilateral lower back pain.  Intermittent pain radiating to buttocks. Patient continues to have severe pain despite good conservative therapies such as home exercise regimen, rest and use of medications. Patients clinical presentation and do seem to be more related to neurogenic claudication as a result of spinal canal stenosis. Lower back pain is likely more arthritic. We discussed treatment plan in detail today, next step is to perform L3-L4 interlaminar epidural steroid injection. He is not currently taking anticoagulant medications. If good relief of pain with injection we can repeat this procedure infrequently as needed. I discussed injection procedure in detail today, he has no questions at this time. Should his lower back pain persist would consider facet joint injections. I also placed order for short course of formal physical therapy. No red flag symptoms noted upon exam today.     Meds & Orders: No orders of the defined types were placed in this encounter.   Orders Placed This Encounter  Procedures   Ambulatory referral to Physical Medicine Rehab   Ambulatory referral to Physical Therapy    Follow-up: Return for L3-L4 interlaminar epidural steroid injection.   Procedures: No procedures performed      Clinical History: MRI LUMBAR SPINE WITHOUT CONTRAST   TECHNIQUE: Multiplanar, multisequence MR imaging of the lumbar spine was performed. No intravenous contrast was administered.   COMPARISON:  Abdominopelvic CT 01/24/2012.   FINDINGS: Segmentation: Conventional anatomy assumed, with the last open disc space designated L5-S1.Concordant with prior imaging.   Alignment: Straightening with minimal convex right scoliosis. No focal angulation or significant listhesis.   Vertebrae: No worrisome osseous lesion,  acute fracture or pars defect. No evidence of discitis or osteomyelitis. There are multilevel endplate degenerative changes, most advanced at L3-4 and L4-5. The lumbar pedicles are diffusely short on a  congenital basis. The visualized sacroiliac joints appear unremarkable.   Conus medullaris: Extends to the T12-L1 level. The conus and cauda equina appear normal.   Paraspinal and other soft tissues: No significant paraspinal findings. Partially imaged renal cysts bilaterally for which no specific follow-up imaging is recommended.   Disc levels:   Sagittal images demonstrate no significant disc space findings within the visualized lower thoracic spine.   L1-2: Relatively preserved disc height with disc desiccation, bulging and mild bilateral facet hypertrophy. No significant spinal stenosis. Mild foraminal narrowing bilaterally without L1 nerve root encroachment.   L2-3: Chronic loss of disc height with annular disc bulging and endplate osteophytes. Mild facet and ligamentous hypertrophy. Resulting mild multifactorial spinal stenosis with mild lateral recess and foraminal narrowing bilaterally.   L3-4: Chronic advanced loss of disc height with annular disc bulging and endplate osteophytes asymmetric to the left. Moderate facet and ligamentous hypertrophy. Moderate spinal stenosis with moderate lateral recess and foraminal narrowing bilaterally.   L4-5: Loss of disc height with annular disc bulging and endplate osteophytes, progressive from remote CT. Moderate facet and ligamentous hypertrophy. Resulting moderate multifactorial spinal stenosis with moderate to severe lateral recess and foraminal narrowing bilaterally.   L5-S1: Preserved disc height and hydration. Mild facet hypertrophy. No spinal stenosis or foraminal narrowing.   IMPRESSION: 1. 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 Spondylosis has progressed from remote CT, especially at L4-5. 2. Moderate multifactorial spinal stenosis at L3-4 and L4-5 with moderate to severe lateral recess and foraminal narrowing bilaterally at both levels. 3. Mild multifactorial spinal stenosis at  L2-3 with mild lateral recess and foraminal narrowing bilaterally.     Electronically Signed   By: Elsie Perone M.D.   On: 04/30/2024 12:00   He reports that he has never smoked. He has never used smokeless tobacco.  Recent Labs    06/25/23 1228 11/20/23 0956 02/05/24 1116  HGBA1C 5.6 5.7* 5.8    Objective:  VS:  HT:    WT:   BMI:     BP:   HR: bpm  TEMP: ( )  RESP:  Physical Exam Vitals and nursing note reviewed.  HENT:     Head: Normocephalic and atraumatic.     Right Ear: External ear normal.     Left Ear: External ear normal.     Nose: Nose normal.     Mouth/Throat:     Mouth: Mucous membranes are moist.  Eyes:     Extraocular Movements: Extraocular movements intact.  Cardiovascular:     Rate and Rhythm: Normal rate.     Pulses: Normal pulses.  Pulmonary:     Effort: Pulmonary effort is normal.  Abdominal:     General: Abdomen is flat. There is no distension.  Musculoskeletal:        General: Tenderness present.     Cervical back: Normal range of motion.     Comments: Patient rises from seated position to standing without difficulty. Good lumbar range of motion. No pain noted with facet loading. 5/5 strength noted with bilateral hip flexion, knee flexion/extension, ankle dorsiflexion/plantarflexion and EHL. No clonus noted bilaterally. No pain upon palpation of greater trochanters. No pain with internal/external rotation of bilateral hips. Sensation intact bilaterally. Negative slump test bilaterally. Ambulates without aid, gait steady.     Skin:  General: Skin is warm and dry.     Capillary Refill: Capillary refill takes less than 2 seconds.  Neurological:     General: No focal deficit present.     Mental Status: He is alert and oriented to person, place, and time.  Psychiatric:        Mood and Affect: Mood normal.        Behavior: Behavior normal.     Ortho Exam  Imaging: No results found.  Past Medical/Family/Surgical/Social  History: Medications & Allergies reviewed per EMR, new medications updated. Patient Active Problem List   Diagnosis Date Noted   Nocturnal leg cramps 01/25/2023   BMI 32.0-32.9,adult Current BMI 32.1 01/25/2023   Morbid obesity (HCC)-start bmi 37.43 12/13/2022   BMI 33.0-33.9,adult-current bmi 33.0 12/13/2022   Prediabetes 10/03/2022   Other hyperlipidemia 10/03/2022   Vitamin D  deficiency 10/03/2022   Other fatigue 09/19/2022   SOB (shortness of breath) 09/19/2022   Generalized obesity 09/19/2022   CAD (coronary artery disease) 12/07/2021   Syncope and collapse 05/01/2018   Primary osteoarthritis of right knee 04/22/2018   Pain due to unicompartmental arthroplasty of knee (HCC) 04/19/2018   Post op infection 11/22/2017   Primary osteoarthritis of left knee 11/19/2017   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 NSTEMI 07/2016 -  LHC 08/07/16 showing 70% D1, 20% mLAD, segmental 95% then 75% OM stenosis, 85% acute marg, normal LVEDP - received PTCA/DES to OM.   Encephalitis, viral    GERD (gastroesophageal reflux disease)    Hyperlipidemia 08/07/2016   Hypertension    Ischemic cardiomyopathy    a. LHC 07/2016 - mild mid anterolateral focal hypocontractility felt to be due to the circumflex stenosis, EF 50%.   Joint pain    Myocardial infarction (HCC) 2017   OA (osteoarthritis)    Obesity (BMI 30-39.9) 08/07/2016   Renal  insufficiency    a. 2016 r/t dehydration/ACEI use.   Restless leg syndrome 09/16/2010   Rheumatic fever    Syncope 2009   associated with viral URI   Family History  Problem Relation Age of Onset   Hypertension Mother    Heart disease Mother    Obesity Mother    Prostate cancer Father    Dementia Father    Depression Brother    Hypertension Brother    Hypertension Brother    Stroke Brother    Colon cancer Neg Hx    Colon polyps Neg Hx    Esophageal cancer Neg Hx    Rectal cancer Neg Hx    Stomach cancer Neg Hx    Past Surgical History:  Procedure Laterality Date   CARDIAC CATHETERIZATION N/A 08/07/2016   Procedure: Left Heart Cath and Coronary Angiography;  Surgeon: Debby DELENA Sor, MD;  Location: [facility];  Service: Cardiovascular;  Laterality: N/A;   CARDIAC CATHETERIZATION N/A 08/07/2016   Procedure: Coronary Stent Intervention;  Surgeon:  Debby DELENA Sor, MD;  Location: [facility];  Service: Cardiovascular;  Laterality: N/A;   COLONOSCOPY  05/21/2012   COLONOSCOPY  12/29/2019   POLYPECTOMY     REPLACEMENT TOTAL KNEE     left and right-  5 total    TONSILLECTOMY     TOTAL KNEE REVISION Left 11/19/2017   Procedure: LEFT TOTAL KNEE REVISION;  Surgeon: Liam Lerner, MD;  Location: [facility];  Service: Orthopedics;  Laterality: Left;   TOTAL KNEE REVISION Right 04/22/2018   Procedure: RIGHT TOTAL KNEE REVISION;  Surgeon: Liam Lerner, MD;  Location: [facility];  Service: Orthopedics;  Laterality: Right;  block   Social History   Occupational History   Occupation: Public affairs consultant: [employer]    Comment: [employer]  Tobacco Use   Smoking status: Never   Smokeless tobacco: Never  Vaping Use   Vaping status: Never Used  Substance and Sexual Activity   Alcohol use: No   Drug use: No   Sexual activity: Yes

## 2024-05-16 NOTE — Progress Notes (Signed)
 Pain Scale   Average Pain 6 Patient advised he has chronic lower back pain, non-radiating  Patient her for MRI review        +Driver, -BT, -Dye Allergies.

## 2024-05-20 ENCOUNTER — Ambulatory Visit (INDEPENDENT_AMBULATORY_CARE_PROVIDER_SITE_OTHER): Admitting: Family Medicine

## 2024-05-23 ENCOUNTER — Other Ambulatory Visit (INDEPENDENT_AMBULATORY_CARE_PROVIDER_SITE_OTHER): Payer: Self-pay | Admitting: Family Medicine

## 2024-05-23 DIAGNOSIS — E559 Vitamin D deficiency, unspecified: Secondary | ICD-10-CM

## 2024-05-29 NOTE — Therapy (Signed)
 OUTPATIENT PHYSICAL THERAPY THORACOLUMBAR EVALUATION   Patient Name: Jacob Quinn MRN: [id number] DOB:Oct 09, 1956, 68 y.o., male Today's Date: 05/30/2024  END OF SESSION:  PT End of Session - 05/30/24 0840     Visit Number 1    Number of Visits 16    Date for PT Re-Evaluation 07/25/24    Progress Note Due on Visit 10    PT Start Time 0848    PT Stop Time 0928    PT Time Calculation (min) 40 min    Activity Tolerance Patient limited by pain    Behavior During Therapy [facility] for tasks assessed/performed          Past Medical History:  Diagnosis Date   1st degree AV block    Back pain    BPH associated with nocturia    CAD in native artery    a. NSTEMI 07/2016 -  LHC 08/07/16 showing 70% D1, 20% mLAD, segmental 95% then 75% OM stenosis, 85% acute marg, normal LVEDP - received PTCA/DES to OM.   Encephalitis, viral    GERD (gastroesophageal reflux disease)    Hyperlipidemia 08/07/2016   Hypertension    Ischemic cardiomyopathy    a. LHC 07/2016 - mild mid anterolateral focal hypocontractility felt to be due to the circumflex stenosis, EF 50%.   Joint pain    Myocardial infarction (HCC) 2017   OA (osteoarthritis)    Obesity (BMI 30-39.9) 08/07/2016   Renal insufficiency    a. 2016 r/t dehydration/ACEI use.   Restless leg syndrome 09/16/2010   Rheumatic fever    Syncope 2009   associated with viral URI   Past Surgical History:  Procedure Laterality Date   CARDIAC CATHETERIZATION N/A 08/07/2016   Procedure: Left Heart Cath and Coronary Angiography;  Surgeon: Debby DELENA Sor, MD;  Location: [facility] INVASIVE CV LAB;  Service: Cardiovascular;  Laterality: N/A;   CARDIAC CATHETERIZATION N/A 08/07/2016   Procedure: Coronary Stent Intervention;  Surgeon: Debby DELENA Sor, MD;  Location: [facility] INVASIVE CV LAB;  Service: Cardiovascular;  Laterality: N/A;   COLONOSCOPY  05/21/2012   COLONOSCOPY  12/29/2019   POLYPECTOMY     REPLACEMENT TOTAL KNEE     left and right-  5 total     TONSILLECTOMY     TOTAL KNEE REVISION Left 11/19/2017   Procedure: LEFT TOTAL KNEE REVISION;  Surgeon: Liam Lerner, MD;  Location: [facility] OR;  Service: Orthopedics;  Laterality: Left;   TOTAL KNEE REVISION Right 04/22/2018   Procedure: RIGHT TOTAL KNEE REVISION;  Surgeon: Liam Lerner, MD;  Location: [facility] ORS;  Service: Orthopedics;  Laterality: Right;  block   Patient Active Problem List   Diagnosis Date Noted   Nocturnal leg cramps 01/25/2023   BMI 32.0-32.9,adult Current BMI 32.1 01/25/2023   Morbid obesity (HCC)-start bmi 37.43 12/13/2022   BMI 33.0-33.9,adult-current bmi 33.0 12/13/2022   Prediabetes 10/03/2022   Other hyperlipidemia 10/03/2022   Vitamin D  deficiency 10/03/2022   Other fatigue 09/19/2022   SOB (shortness of breath) 09/19/2022   Generalized obesity 09/19/2022   CAD (coronary artery disease) 12/07/2021   Syncope and collapse 05/01/2018   Primary osteoarthritis of right knee 04/22/2018   Pain due to unicompartmental arthroplasty of knee (HCC) 04/19/2018   Post op infection 11/22/2017   Primary osteoarthritis of left knee 11/19/2017   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   Presence of left artificial knee joint 12/20/2016   Presence of right artificial knee joint 12/20/2016   Essential hypertension 08/08/2016   Cardiomyopathy,  ischemic 08/08/2016   History of NSTEMI 08/07/2016   Obesity (BMI 30-39.9) 08/07/2016   Dyslipidemia, goal LDL below 70 08/07/2016   CAD S/P percutaneous coronary angioplasty 08/07/2016   Restless leg syndrome 09/16/2010   Osteoarthritis 11/22/2007    PCP: Darleene Shape, NP   REFERRING PROVIDER: Duwaine FORBES Pouch, NP  REFERRING DIAG: M54.42,M54.41,G89.29 (ICD-10-CM) - Chronic bilateral low back pain with bilateral sciatica M54.16 (ICD-10-CM) - Radiculopathy, lumbar region M48.062 (ICD-10-CM) - Spinal stenosis of lumbar region with neurogenic claudication  Rationale for Evaluation and Treatment: Rehabilitation  THERAPY  DIAG:  Other low back pain  Radiculopathy, lumbosacral region  Muscle weakness (generalized)  Other abnormalities of gait and mobility  ONSET DATE: years   SUBJECTIVE:                                                                                                                                                                                           SUBJECTIVE STATEMENT: Just a little back pain.   PERTINENT HISTORY:  Patient notes that he has had back pain for many years and has been told by his medical providers that he has an issue with a nerve not receiving enough blood flow. Patient endorses receiving at least 5 injections over the last few years that have improved his pain. His next injection is scheduled for June 12, 2024 with Duwaine Pouch, NP. Patient has had multiple knee replacements and arthroscopic knee surgeries in the following years: 90s (partial replacement and arthroscopic), 2005 (bilat knee replacements), 2008, 2018 (two knee surgeries), 2022.    PAIN:  Are you having pain? Yes: NPRS scale: 5-6/10 at rest, 9-10/10 max  Pain location: middle and left of lower back Pain description: sharp, shooting nerve-like pain Aggravating factors: exercise, mowing, sitting for prolonged times  Relieving factors: cortisone shots, relaxing   PRECAUTIONS: None  RED FLAGS: None   WEIGHT BEARING RESTRICTIONS: No  FALLS:  Has patient fallen in last 6 months? No  LIVING ENVIRONMENT: Lives with: lives alone Lives in: House/apartment Stairs: Yes: External: 3-4 steps; can reach both Has following equipment at home: Single point cane, Quad cane small base, Walker - 2 wheeled, Crutches, Wheelchair (manual), shower chair, Tour manager, and Grab bars  OCCUPATION: retired; part-time work   PLOF: Independent  PATIENT GOALS: mowing, yard-work, exercise   NEXT MD VISIT: June 12, 2024 with Thune for cortisone shot   OBJECTIVE:  Note: Objective measures were completed at  Evaluation unless otherwise noted.  DIAGNOSTIC FINDINGS:  IMPRESSION: 1. 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 Spondylosis has progressed from remote CT, especially at L4-5. 2. Moderate multifactorial spinal stenosis  at L3-4 and L4-5 with moderate to severe lateral recess and foraminal narrowing bilaterally at both levels. 3. Mild multifactorial spinal stenosis at L2-3 with mild lateral recess and foraminal narrowing bilaterally.  PATIENT SURVEYS:  PSFS: THE PATIENT SPECIFIC FUNCTIONAL SCALE  Place score of 0-10 (0 = unable to perform activity and 10 = able to perform activity at the same level as before injury or problem)  Activity Date: 05/30/2024    Mowing/yard-work  2    2. Exercise  2    3. Bowling  3    4.      Total Score 2.33      Total Score = Sum of activity scores/number of activities  Minimally Detectable Change: 3 points (for single activity); 2 points (for average score)  [facility] (nd). The Patient Specific Functional Scale . Retrieved from [email or web address]   COGNITION: Overall cognitive status: Within functional limits for tasks assessed     SENSATION: Light touch: WFL  MUSCLE LENGTH: Not tested on eval, but would benefit at next session from hamstring length assessment   POSTURE: rounded shoulders, forward head, and increased thoracic kyphosis  PALPATION: Highly TTP in lumbar musculature, sacrum, and lumbar spine   LUMBAR ROM:   AROM Eval 05/30/2024  Flexion Reach knees; painful   Extension Highly limited due to pain  Right lateral flexion Highly limited due to pain  Left lateral flexion Highly limited due to pain  Right rotation Can reach about 50% but limited to stiffness  Left rotation Can reach about 50% but limited to stiffness   (Blank rows = not tested)  LOWER EXTREMITY ROM:     ROM Right Eval 05/30/2024 Left Eval 05/30/2024  Hip  flexion AROM: 65deg, PROM: 75deg slight pain AROM: 52deg, PROM: 58deg more painful   Hip extension    Hip abduction    Hip adduction    Hip internal rotation    Hip external rotation    Knee flexion    Knee extension    Ankle dorsiflexion    Ankle plantarflexion    Ankle inversion    Ankle eversion     (Blank rows = not tested)  LOWER EXTREMITY MMT:    MMT Right Eval 05/30/2024 Left Eval 05/30/2024  Hip flexion (seated) 4-/5, slight pain 4-/5 slight pain  Hip extension (prone) 3/5 highly painful 3/5 highly painful  Hip abduction (sidelying) 4/5 slight pain 4-/5 slight pain  Hip adduction    Hip internal rotation    Hip external rotation    Knee flexion (seated) 4+/5 slight pain 4+/5 slight pain  Knee extension (seated) 4+/5 slight pain 4+/5 slight pain  Ankle dorsiflexion    Ankle plantarflexion    Ankle inversion    Ankle eversion     (Blank rows = not tested)  LUMBAR SPECIAL TESTS:   FABER: neg, limited ROM  FADIR: highly limited IR, SLR: neg on L, slight pos on R increase with DF   FUNCTIONAL TESTS:  Not test on eval  GAIT: Distance walked: not formally assessed  Assistive device utilized: None Level of assistance: Complete Independence Comments: antalgic gait pattern, increased knee flexion during stance (bilat), Lt lateral lean with Lt stance   TREATMENT DATE:  05/30/2024   TherEx:  HEP handout provided with patient performing one set of each exercise for appropriate form. Verbal and tactile cues required. PT discussed importance of mobility, pain vs discomfort with stretching, and regression to exercises provided.  PATIENT EDUCATION:  Education details: HEP, importance of mobility, regressions, pain vs discomfort  Person educated: Patient Education method: Explanation, Demonstration, Tactile cues, Verbal cues, and Handouts Education  comprehension: verbalized understanding, returned demonstration, verbal cues required, and tactile cues required  HOME EXERCISE PROGRAM: Access Code: [email or web address] URL: [email or web address] Date: 05/30/2024 Prepared by: Susannah Daring  Exercises - Supine Lower Trunk Rotation  - 1 x daily - 7 x weekly - 3 sets - 10 reps - 2-3s hold - Seated Single Knee to Chest  - 1 x daily - 7 x weekly - 1-2 sets - 30s hold - Seated Hamstring Stretch  - 1 x daily - 7 x weekly - 1-2 sets - 30s hold - Supine Bridge  - 1 x daily - 7 x weekly - 1-2 sets - 10 reps - 2-3s hold - Supine Figure 4 Supported  - 1 x daily - 7 x weekly - 1-2 sets - 30s hold  ASSESSMENT:  CLINICAL IMPRESSION: Patient is a 68 y.o. M who was seen today for physical therapy evaluation and treatment for Low back pain with functional mobility deficits, strength deficits, flexibility deficits, and high pain levels. Patient has undergone multiple bilat knee surgeries, starting in 1990s, as well as received several injection for his low back pain, over the past few years. Patient is mainly limited due to high pain levels with mobility and ROM deficits. Patient will benefit from skilled physical therapy to improve upon deficits noted above.   OBJECTIVE IMPAIRMENTS: Abnormal gait, decreased activity tolerance, decreased balance, decreased mobility, difficulty walking, decreased ROM, decreased strength, hypomobility, impaired flexibility, postural dysfunction, and pain.   ACTIVITY LIMITATIONS: bending, sitting, standing, squatting, stairs, and bed mobility  PARTICIPATION LIMITATIONS: community activity, occupation, and yard work  PERSONAL FACTORS: 3+ comorbidities: history of NSTEMI, dyslipidemia, HTN, history of ischemic cardiomyopathy, CAD, prediabetes are also affecting patient's functional outcome.   REHAB POTENTIAL: Good  CLINICAL DECISION MAKING: Evolving/moderate complexity  EVALUATION COMPLEXITY: Moderate   GOALS: Goals  reviewed with patient? Yes  SHORT TERM GOALS: Target date: 06/20/2024  Patient will show compliance with initial HEP.  Baseline: Goal status: INITIAL  2.  Patient will report pain levels no greater than 5/10 to show improved overall quality of life. Baseline:  Goal status: INITIAL   LONG TERM GOALS: Target date: 07/25/2024  Patient will show compliance and independence with final HEP in order to maintain and progress upon functional gains made in PT.  Baseline:  Goal status: INITIAL  2.  Patient will report pain levels no greater than 3/10 to show improved overall quality of life. Baseline:  Goal status: INITIAL  3.  Patient will increase PSFS to at least 4.33 in order to show improved subjective disability rating. Baseline:  Goal status: INITIAL  4.  Patient will improve back extension ROM to at least 50% in order to show improved functional mobility.  Baseline:  Goal status: INITIAL  5.  Patient will improve hip extension MMT to at least 4/5 in order to show improved biomechanics with functional activities. Baseline:  Goal status: INITIAL   PLAN:  PT FREQUENCY: 1-2x/week  PT DURATION: 8 weeks  PLANNED INTERVENTIONS: 97164- PT Re-evaluation, 97750- Physical Performance Testing, 97110-Therapeutic exercises, 97530- Therapeutic activity, [id number]- Neuromuscular re-education, 97535- Self Care, [id number]- Manual therapy, [id number]- Gait training, [phone]- Electrical stimulation (unattended), [phone]- Electrical stimulation (manual), [id number]- Vasopneumatic device, [id number]- Ultrasound, [id number]- Traction (mechanical), [id number]- Ionotophoresis 4mg /ml Dexamethasone , 79439 (1-2 muscles), 20561 (3+ muscles)- Dry Needling, Patient/Family education, Balance training, Stair training, Joint  mobilization, Joint manipulation, Spinal manipulation, Spinal mobilization, Cryotherapy, and Moist heat.  PLAN FOR NEXT SESSION: review HEP, functional mobility, lumbar mobility, hip flexibility, hip strengthening, manual  (mobs, etc.)   Susannah Daring, PT, DPT 05/30/24 10:06 AM

## 2024-05-30 ENCOUNTER — Ambulatory Visit

## 2024-05-30 DIAGNOSIS — M5459 Other low back pain: Secondary | ICD-10-CM

## 2024-05-30 DIAGNOSIS — M6281 Muscle weakness (generalized): Secondary | ICD-10-CM

## 2024-05-30 DIAGNOSIS — M5417 Radiculopathy, lumbosacral region: Secondary | ICD-10-CM | POA: Diagnosis not present

## 2024-05-30 DIAGNOSIS — R2689 Other abnormalities of gait and mobility: Secondary | ICD-10-CM

## 2024-06-02 ENCOUNTER — Ambulatory Visit: Payer: Self-pay | Admitting: *Deleted

## 2024-06-02 NOTE — Telephone Encounter (Signed)
 Copied from CRM [id number]. Topic: Clinical - Red Word Triage >> Jun 02, 2024  8:27 AM Mesmerise C wrote: [facility] that prompted transfer to Nurse Triage: Patient has pain below ribcage been ongoing about a week now, states it's an ache not constant comes and goes Reason for Disposition  [1] MODERATE pain (e.g., interferes with normal activities) AND [2] pain comes and goes (cramps) AND [3] present > 24 hours  (Exception: Pain with Vomiting or Diarrhea - see that Guideline.)  Answer Assessment - Initial Assessment Questions 1. LOCATION: Where does it hurt?      I'm having intermittent pain under my ribcage for a few weeks.  It's on the right side.    2. RADIATION: Does the pain shoot anywhere else? (e.g., chest, back)     No  3. ONSET: When did the pain begin? (Minutes, hours or days ago)      About a month but happening more frequently now. 4. SUDDEN: Gradual or sudden onset?     Not asked 5. PATTERN Does the pain come and go, or is it constant?     Intermittent 6. SEVERITY: How bad is the pain?  (e.g., Scale 1-10; mild, moderate, or severe)     At it's worst 6-7/10 pain scale 7. RECURRENT SYMPTOM: Have you ever had this type of stomach pain before? If Yes, ask: When was the last time? and What happened that time?      No 8. CAUSE: What do you think is causing the stomach pain? (e.g., gallstones, recent abdominal surgery)     Not asked 9. RELIEVING/AGGRAVATING FACTORS: What makes it better or worse? (e.g., antacids, bending or twisting motion, bowel movement)     nothing 10. OTHER SYMPTOMS: Do you have any other symptoms? (e.g., back pain, diarrhea, fever, urination pain, vomiting)       None  Protocols used: Abdominal Pain - Male-A-AH FYI Only or Action Required?: FYI only for provider.  Patient was last seen in primary care on 04/28/2024 by [place] Adelita PENNER, MD.  Called Nurse Triage reporting Abdominal Pain.  Symptoms began about a month ago. And becoming  worse.   Pain in right upper abd under ribcage.   No other symptoms  Interventions attempted: Prescription medications: Takes Prilosec.  Symptoms are: gradually worsening.  Triage Disposition: See Within 3 Days in Office  Patient/caregiver understands and will follow disposition?: Yes

## 2024-06-04 ENCOUNTER — Encounter: Payer: Self-pay | Admitting: Adult Health

## 2024-06-04 ENCOUNTER — Ambulatory Visit: Admitting: Adult Health

## 2024-06-04 VITALS — BP 130/84 | HR 70 | Temp 97.8°F | Wt 259.4 lb

## 2024-06-04 DIAGNOSIS — R1011 Right upper quadrant pain: Secondary | ICD-10-CM

## 2024-06-04 LAB — CBC
HCT: 42.9 % (ref 39.0–52.0)
Hemoglobin: 14.1 g/dL (ref 13.0–17.0)
MCHC: 32.9 g/dL (ref 30.0–36.0)
MCV: 89.5 fl (ref 78.0–100.0)
Platelets: 250 K/uL (ref 150.0–400.0)
RBC: 4.79 Mil/uL (ref 4.22–5.81)
RDW: 13.7 % (ref 11.5–15.5)
WBC: 6.6 K/uL (ref 4.0–10.5)

## 2024-06-04 LAB — COMPREHENSIVE METABOLIC PANEL WITH GFR
ALT: 13 U/L (ref 0–53)
AST: 20 U/L (ref 0–37)
Albumin: 4.1 g/dL (ref 3.5–5.2)
Alkaline Phosphatase: 69 U/L (ref 39–117)
BUN: 12 mg/dL (ref 6–23)
CO2: 31 meq/L (ref 19–32)
Calcium: 9.4 mg/dL (ref 8.4–10.5)
Chloride: 100 meq/L (ref 96–112)
Creatinine, Ser: 1.2 mg/dL (ref 0.40–1.50)
GFR: 61.52 mL/min (ref >60.00)
Glucose, Bld: 81 mg/dL (ref 70–99)
Potassium: 3.9 meq/L (ref 3.5–5.1)
Sodium: 137 meq/L (ref 135–145)
Total Bilirubin: 0.8 mg/dL (ref 0.2–1.2)
Total Protein: 7.7 g/dL (ref 6.0–8.3)

## 2024-06-04 LAB — AMYLASE: Amylase: 50 U/L (ref 27–131)

## 2024-06-04 LAB — LIPASE: Lipase: 43 U/L (ref 11.0–59.0)

## 2024-06-04 NOTE — Patient Instructions (Addendum)
 I am going to order an ultrasound to look at your gallbladder.   I am also going to do some lab work today   [facility] - Colonoscopy  Address: [address] [place] [address], [place], [place] [address] Phone: [phone]

## 2024-06-04 NOTE — Progress Notes (Signed)
 Subjective:    Patient ID: Jacob Quinn, male    DOB: 12-24-1955, 68 y.o.   MRN: [id number]  HPI 68 year old male who  has a past medical history of 1st degree AV block, Back pain, BPH associated with nocturia, CAD in native artery, Encephalitis, viral, GERD (gastroesophageal reflux disease), Hyperlipidemia (08/07/2016), Hypertension, Ischemic cardiomyopathy, Joint pain, Myocardial infarction (HCC) (2017), OA (osteoarthritis), Obesity (BMI 30-39.9) (08/07/2016), Renal insufficiency, Restless leg syndrome (09/16/2010), Rheumatic fever, and Syncope (2009).  68 year old male who is being evaluated today for an acute visit.  He reports that over the last month or so he has had pretty consistent nagging discomfort under his right rib cage.  Pain is not severe and is more annoying than anything.  It comes and goes throughout the day.  Sometimes more discomfort after eating but not always.  He has not had any nausea, vomiting, or diarrhea.  He has not felt feverish or had any chills.   Review of Systems See HPI   Past Medical History:  Diagnosis Date   1st degree AV block    Back pain    BPH associated with nocturia    CAD in native artery    a. NSTEMI 07/2016 -  LHC 08/07/16 showing 70% D1, 20% mLAD, segmental 95% then 75% OM stenosis, 85% acute marg, normal LVEDP - received PTCA/DES to OM.   Encephalitis, viral    GERD (gastroesophageal reflux disease)    Hyperlipidemia 08/07/2016   Hypertension    Ischemic cardiomyopathy    a. LHC 07/2016 - mild mid anterolateral focal hypocontractility felt to be due to the circumflex stenosis, EF 50%.   Joint pain    Myocardial infarction (HCC) 2017   OA (osteoarthritis)    Obesity (BMI 30-39.9) 08/07/2016   Renal insufficiency    a. 2016 r/t dehydration/ACEI use.   Restless leg syndrome 09/16/2010   Rheumatic fever    Syncope 2009   associated with viral URI    Social History   Socioeconomic History   Marital status: Single    Spouse  name: Not on file   Number of children: 3   Years of education: Not on file   Highest education level: Some college, no degree  Occupational History   Occupation: Public affairs consultant: [employer]    Comment: [employer]  Tobacco Use   Smoking status: Never   Smokeless tobacco: Never  Vaping Use   Vaping status: Never Used  Substance and Sexual Activity   Alcohol use: No   Drug use: No   Sexual activity: Yes  Other Topics Concern   Not on file  Social History Narrative   Lives with daughter   Is retired -    Social Drivers of Corporate investment banker Strain: Low Risk  (12/18/2023)   Overall Financial Resource Strain (CARDIA)    Difficulty of Paying Living Expenses: Not very hard  Food Insecurity: Food Insecurity Present (12/18/2023)   Hunger Vital Sign    Worried About Running Out of Food in the Last Year: Never true    Ran Out of Food in the Last Year: Sometimes true  Transportation Needs: No Transportation Needs (12/18/2023)   PRAPARE - Administrator, Civil Service (Medical): No    Lack of Transportation (Non-Medical): No  Physical Activity: Insufficiently Active (12/18/2023)   Exercise Vital Sign    Days of Exercise per Week: 3 days    Minutes of Exercise per  Session: 30 min  Stress: Patient Declined (12/18/2023)   [employer] of Occupational Health - Occupational Stress Questionnaire    Feeling of Stress : Patient declined  Social Connections: Moderately Isolated (12/18/2023)   Social Connection and Isolation Panel    Frequency of Communication with Friends and Family: More than three times a week    Frequency of Social Gatherings with Friends and Family: Three times a week    Attends Religious Services: 1 to 4 times per year    Active Member of Clubs or Organizations: No    Attends Banker Meetings: Not on file    Marital Status: Divorced  Intimate Partner Violence: Not At Risk (05/22/2022)   Humiliation, Afraid, Rape,  and Kick questionnaire    Fear of Current or Ex-Partner: No    Emotionally Abused: No    Physically Abused: No    Sexually Abused: No    Past Surgical History:  Procedure Laterality Date   CARDIAC CATHETERIZATION N/A 08/07/2016   Procedure: Left Heart Cath and Coronary Angiography;  Surgeon: Debby DELENA Sor, MD;  Location: [facility];  Service: Cardiovascular;  Laterality: N/A;   CARDIAC CATHETERIZATION N/A 08/07/2016   Procedure: Coronary Stent Intervention;  Surgeon: Debby DELENA Sor, MD;  Location: [facility];  Service: Cardiovascular;  Laterality: N/A;   COLONOSCOPY  05/21/2012   COLONOSCOPY  12/29/2019   POLYPECTOMY     REPLACEMENT TOTAL KNEE     left and right-  5 total    TONSILLECTOMY     TOTAL KNEE REVISION Left 11/19/2017   Procedure: LEFT TOTAL KNEE REVISION;  Surgeon: Liam Lerner, MD;  Location: [facility];  Service: Orthopedics;  Laterality: Left;   TOTAL KNEE REVISION Right 04/22/2018   Procedure: RIGHT TOTAL KNEE REVISION;  Surgeon: Liam Lerner, MD;  Location: [facility];  Service: Orthopedics;  Laterality: Right;  block    Family History  Problem Relation Age of Onset   Hypertension Mother    Heart disease Mother    Obesity Mother    Prostate cancer Father    Dementia Father    Depression Brother    Hypertension Brother    Hypertension Brother    Stroke Brother    Colon cancer Neg Hx    Colon polyps Neg Hx    Esophageal cancer Neg Hx    Rectal cancer Neg Hx    Stomach cancer Neg Hx     Allergies  Allergen Reactions   Penicillins Anaphylaxis    *Has tolerated cephalosporins in the past*  Has patient had a PCN reaction causing immediate rash, facial/tongue/throat swelling, SOB or lightheadedness with hypotension: Yes Has patient had a PCN reaction causing severe rash involving mucus membranes or skin necrosis: No Has patient had a PCN reaction that required hospitalization: No Has patient had a PCN reaction occurring within the last 10 years: No If  all of the above answers are NO, then may proceed with Cephalosporin use.    Lisinopril      Cramp    Tizanidine  Other (See Comments)    High fevers, shaking   Latex Rash    Current Outpatient Medications on File Prior to Visit  Medication Sig Dispense Refill   amLODipine  (NORVASC ) 10 MG tablet TAKE 1 TABLET BY MOUTH EVERY DAY 90 tablet 0   aspirin  81 MG tablet Take 81 mg by mouth daily.     atenolol (TENORMIN) 25 MG tablet Take 25 mg by mouth daily.     atorvastatin  (  LIPITOR ) 80 MG tablet Take by mouth.     cyanocobalamin  (VITAMIN B12) 500 MCG tablet Take 1 tablet (500 mcg total) by mouth daily.     metFORMIN  (GLUCOPHAGE ) 500 MG tablet Take 1 tablet (500 mg total) by mouth 2 (two) times daily with a meal. 1 tab with lunch and 1 with dinner 60 tablet 1   Multiple Vitamin (MULTI VITAMIN DAILY) TABS Take 1 tablet by mouth daily.     nitroGLYCERIN  (NITROSTAT ) 0.4 MG SL tablet Place 1 tablet (0.4 mg total) under the tongue every 5 (five) minutes as needed for chest pain. Up to 3 doses. 25 tablet 3   tamsulosin  (FLOMAX ) 0.4 MG CAPS capsule TAKE 1 CAPSULE (0.4 MG TOTAL) BY MOUTH DAILY AFTER BREAKFAST. 90 capsule 1   traMADol  (ULTRAM ) 50 MG tablet Take 1 tablet (50 mg total) by mouth every 8 (eight) hours as needed. 20 tablet 0   Vitamin D , Ergocalciferol , (DRISDOL ) 1.25 MG (50000 UNIT) CAPS capsule 1 tab twice wkly 8 capsule 1   No current facility-administered medications on file prior to visit.    BP 130/84 (BP Location: Left Arm, Patient Position: Sitting, Cuff Size: Large)   Pulse 70   Temp 97.8 F (36.6 C) (Oral)   Wt 259 lb 6.4 oz (117.7 kg)   SpO2 99%   BMI 35.18 kg/m       Objective:   Physical Exam Vitals reviewed.  Cardiovascular:     Rate and Rhythm: Normal rate and regular rhythm.     Pulses: Normal pulses.     Heart sounds: Normal heart sounds.  Pulmonary:     Effort: Pulmonary effort is normal.     Breath sounds: Normal breath sounds.  Abdominal:     General:  Abdomen is flat. Bowel sounds are normal.     Palpations: Abdomen is soft.     Tenderness: There is abdominal tenderness in the right upper quadrant. Positive signs include obturator sign.  Skin:    General: Skin is warm and dry.  Neurological:     General: No focal deficit present.     Mental Status: He is oriented to person, place, and time.  Psychiatric:        Mood and Affect: Mood normal.        Behavior: Behavior normal.        Thought Content: Thought content normal.        Judgment: Judgment normal.        Assessment & Plan:  1. RUQ abdominal pain (Primary) - Likely gallbladder disease.  Will get ultrasound of right upper quadrant lab work.  He did not feel as though he needed any pain medication at this time.  He was encouraged to follow-up if pain becomes worse.  Also encouraged to eat a heart healthy diet to prevent inflammation of the gallbladder. - US  Abdomen Limited RUQ (LIVER/GB); Future - Comprehensive metabolic panel with GFR; Future - Lipase; Future - Amylase; Future - CBC; Future  Darleene Shape, NP

## 2024-06-05 ENCOUNTER — Ambulatory Visit: Payer: Self-pay | Admitting: Adult Health

## 2024-06-06 ENCOUNTER — Encounter: Payer: Self-pay | Admitting: Adult Health

## 2024-06-09 ENCOUNTER — Encounter (INDEPENDENT_AMBULATORY_CARE_PROVIDER_SITE_OTHER): Payer: Self-pay

## 2024-06-09 ENCOUNTER — Ambulatory Visit (INDEPENDENT_AMBULATORY_CARE_PROVIDER_SITE_OTHER): Admitting: Family Medicine

## 2024-06-09 ENCOUNTER — Encounter: Admitting: Physical Therapy

## 2024-06-10 ENCOUNTER — Ambulatory Visit
Admission: RE | Admit: 2024-06-10 | Discharge: 2024-06-10 | Disposition: A | Discharge status: Home / Self Care | Source: Ambulatory Visit | Attending: Adult Health | Admitting: Adult Health

## 2024-06-10 DIAGNOSIS — R1011 Right upper quadrant pain: Secondary | ICD-10-CM

## 2024-06-12 ENCOUNTER — Telehealth: Payer: Self-pay | Admitting: *Deleted

## 2024-06-12 ENCOUNTER — Ambulatory Visit: Admitting: Physical Medicine and Rehabilitation

## 2024-06-12 ENCOUNTER — Other Ambulatory Visit: Payer: Self-pay | Admitting: Adult Health

## 2024-06-12 ENCOUNTER — Other Ambulatory Visit: Payer: Self-pay

## 2024-06-12 VITALS — BP 137/86 | HR 86

## 2024-06-12 DIAGNOSIS — M5416 Radiculopathy, lumbar region: Secondary | ICD-10-CM

## 2024-06-12 DIAGNOSIS — R1011 Right upper quadrant pain: Secondary | ICD-10-CM

## 2024-06-12 MED ORDER — METHYLPREDNISOLONE ACETATE 40 MG/ML IJ SUSP
40.0000 mg | Freq: Once | INTRAMUSCULAR | Status: AC
  Administered 2024-06-12: 40 mg

## 2024-06-12 NOTE — Telephone Encounter (Signed)
 Noted

## 2024-06-12 NOTE — Telephone Encounter (Unsigned)
 Copied from CRM [id number]. Topic: General - Other >> Jun 12, 2024 10:33 AM Zy'onna H wrote: Reason for CRM: Patient had a referral come in for a CT Abd Pel w/o Contrast.  Sonny the patients representative stated that a new referral is needed.  I attempted to Warm Transfer to the CT/Imaging line but the number listed did not work.   Please advise. >> Jun 12, 2024 10:43 AM Robinson DEL wrote: Sonny with [facility] following up on message sent today regarding CT order needs to be revised to either with contrast or without can't be both. Can't schedule patient until order is revised.  Sonny [facility] [phone] Ext [phone]

## 2024-06-12 NOTE — Patient Instructions (Signed)

## 2024-06-12 NOTE — Telephone Encounter (Signed)
 Please advise if you want the pt to have contrast or not can't be both per note.

## 2024-06-12 NOTE — Telephone Encounter (Signed)
 Tried to call imaging back no answer.

## 2024-06-12 NOTE — Progress Notes (Signed)
 Pain Scale   Average Pain 5 Patient advising he has chronic lower back pain that increases when he stands and walks at times pain lessens when he sits.         +Driver, -BT, -Dye Allergies.

## 2024-06-12 NOTE — Telephone Encounter (Unsigned)
 Copied from CRM [id number]. Topic: General - Other >> Jun 12, 2024 10:33 AM Zy'onna H wrote: Reason for CRM: Patient had a referral come in for a CT Abd Pel w/o Contrast.  Jacob Quinn the patients representative stated that a new referral is needed.  I attempted to Warm Transfer to the CT/Imaging line but the number listed did not work.   Please advise.

## 2024-06-16 NOTE — Therapy (Incomplete)
 OUTPATIENT PHYSICAL THERAPY THORACOLUMBAR EVALUATION   Patient Name: Jacob Quinn MRN: [id number] DOB:25-Sep-1956, 68 y.o., male Today's Date: 06/16/2024  END OF SESSION:    Past Medical History:  Diagnosis Date   1st degree AV block    Back pain    BPH associated with nocturia    CAD in native artery    a. NSTEMI 07/2016 -  LHC 08/07/16 showing 70% D1, 20% mLAD, segmental 95% then 75% OM stenosis, 85% acute marg, normal LVEDP - received PTCA/DES to OM.   Encephalitis, viral    GERD (gastroesophageal reflux disease)    Hyperlipidemia 08/07/2016   Hypertension    Ischemic cardiomyopathy    a. LHC 07/2016 - mild mid anterolateral focal hypocontractility felt to be due to the circumflex stenosis, EF 50%.   Joint pain    Myocardial infarction (HCC) 2017   OA (osteoarthritis)    Obesity (BMI 30-39.9) 08/07/2016   Renal insufficiency    a. 2016 r/t dehydration/ACEI use.   Restless leg syndrome 09/16/2010   Rheumatic fever    Syncope 2009   associated with viral URI   Past Surgical History:  Procedure Laterality Date   CARDIAC CATHETERIZATION N/A 08/07/2016   Procedure: Left Heart Cath and Coronary Angiography;  Surgeon: Debby DELENA Sor, MD;  Location: [facility];  Service: Cardiovascular;  Laterality: N/A;   CARDIAC CATHETERIZATION N/A 08/07/2016   Procedure: Coronary Stent Intervention;  Surgeon: Debby DELENA Sor, MD;  Location: [facility];  Service: Cardiovascular;  Laterality: N/A;   COLONOSCOPY  05/21/2012   COLONOSCOPY  12/29/2019   POLYPECTOMY     REPLACEMENT TOTAL KNEE     left and right-  5 total    TONSILLECTOMY     TOTAL KNEE REVISION Left 11/19/2017   Procedure: LEFT TOTAL KNEE REVISION;  Surgeon: Liam Lerner, MD;  Location: [facility];  Service: Orthopedics;  Laterality: Left;   TOTAL KNEE REVISION Right 04/22/2018   Procedure: RIGHT TOTAL KNEE REVISION;  Surgeon: Liam Lerner, MD;  Location: [facility];  Service: Orthopedics;  Laterality: Right;  block    Patient Active Problem List   Diagnosis Date Noted   Nocturnal leg cramps 01/25/2023   BMI 32.0-32.9,adult Current BMI 32.1 01/25/2023   Morbid obesity (HCC)-start bmi 37.43 12/13/2022   BMI 33.0-33.9,adult-current bmi 33.0 12/13/2022   Prediabetes 10/03/2022   Other hyperlipidemia 10/03/2022   Vitamin D  deficiency 10/03/2022   Other fatigue 09/19/2022   SOB (shortness of breath) 09/19/2022   Generalized obesity 09/19/2022   CAD (coronary artery disease) 12/07/2021   Syncope and collapse 05/01/2018   Primary osteoarthritis of right knee 04/22/2018   Pain due to unicompartmental arthroplasty of knee (HCC) 04/19/2018   Post op infection 11/22/2017   Primary osteoarthritis of left knee 11/19/2017   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   Presence of left artificial knee joint 12/20/2016   Presence of right artificial knee joint 12/20/2016   Essential hypertension 08/08/2016   Cardiomyopathy, ischemic 08/08/2016   History of NSTEMI 08/07/2016   Obesity (BMI 30-39.9) 08/07/2016   Dyslipidemia, goal LDL below 70 08/07/2016   CAD S/P percutaneous coronary angioplasty 08/07/2016   Restless leg syndrome 09/16/2010   Osteoarthritis 11/22/2007    PCP: Darleene Shape, NP   REFERRING PROVIDER: Duwaine FORBES Pouch, NP  REFERRING DIAG: M54.42,M54.41,G89.29 (ICD-10-CM) - Chronic bilateral low back pain with bilateral sciatica M54.16 (ICD-10-CM) - Radiculopathy, lumbar region M48.062 (ICD-10-CM) - Spinal stenosis of lumbar region with neurogenic claudication  Rationale for Evaluation and Treatment: Rehabilitation  THERAPY DIAG:  No diagnosis found.  ONSET DATE: years   SUBJECTIVE:                                                                                                                                                                                           SUBJECTIVE STATEMENT: ***  Just a little back pain.   PERTINENT HISTORY:  Patient notes that he has  had back pain for many years and has been told by his medical providers that he has an issue with a nerve not receiving enough blood flow. Patient endorses receiving at least 5 injections over the last few years that have improved his pain. His next injection is scheduled for June 12, 2024 with Duwaine Pouch, NP. Patient has had multiple knee replacements and arthroscopic knee surgeries in the following years: 90s (partial replacement and arthroscopic), 2005 (bilat knee replacements), 2008, 2018 (two knee surgeries), 2022.    PAIN:  Are you having pain? Yes: NPRS scale: 5-6/10 at rest, 9-10/10 max  Pain location: middle and left of lower back Pain description: sharp, shooting nerve-like pain Aggravating factors: exercise, mowing, sitting for prolonged times  Relieving factors: cortisone shots, relaxing   PRECAUTIONS: None  RED FLAGS: None   WEIGHT BEARING RESTRICTIONS: No  FALLS:  Has patient fallen in last 6 months? No  LIVING ENVIRONMENT: Lives with: lives alone Lives in: House/apartment Stairs: Yes: External: 3-4 steps; can reach both Has following equipment at home: Single point cane, Quad cane small base, Walker - 2 wheeled, Crutches, Wheelchair (manual), shower chair, Tour manager, and Grab bars  OCCUPATION: retired; part-time work   PLOF: Independent  PATIENT GOALS: mowing, yard-work, exercise   NEXT MD VISIT: June 12, 2024 with Romick for cortisone shot   OBJECTIVE:  Note: Objective measures were completed at Evaluation unless otherwise noted.  DIAGNOSTIC FINDINGS:  IMPRESSION: 1. 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 Spondylosis has progressed from remote CT, especially at L4-5. 2. Moderate multifactorial spinal stenosis at L3-4 and L4-5 with moderate to severe lateral recess and foraminal narrowing bilaterally at both levels. 3. Mild multifactorial spinal stenosis at L2-3 with mild lateral recess and foraminal narrowing  bilaterally.  PATIENT SURVEYS:  PSFS: THE PATIENT SPECIFIC FUNCTIONAL SCALE  Place score of 0-10 (0 = unable to perform activity and 10 = able to perform activity at the same level as before injury or problem)  Activity Date: 05/30/2024    Mowing/yard-work  2    2. Exercise  2    3. Bowling  3    4.  Total Score 2.33      Total Score = Sum of activity scores/number of activities  Minimally Detectable Change: 3 points (for single activity); 2 points (for average score)  [facility] (nd). The Patient Specific Functional Scale . Retrieved from [email or web address]   COGNITION: Overall cognitive status: Within functional limits for tasks assessed     SENSATION: Light touch: WFL  MUSCLE LENGTH: Not tested on eval, but would benefit at next session from hamstring length assessment   POSTURE: rounded shoulders, forward head, and increased thoracic kyphosis  PALPATION: Highly TTP in lumbar musculature, sacrum, and lumbar spine   LUMBAR ROM:   AROM Eval 05/30/2024  Flexion Reach knees; painful   Extension Highly limited due to pain  Right lateral flexion Highly limited due to pain  Left lateral flexion Highly limited due to pain  Right rotation Can reach about 50% but limited to stiffness  Left rotation Can reach about 50% but limited to stiffness   (Blank rows = not tested)  LOWER EXTREMITY ROM:     ROM Right Eval 05/30/2024 Left Eval 05/30/2024  Hip flexion AROM: 65deg, PROM: 75deg slight pain AROM: 52deg, PROM: 58deg more painful   Hip extension    Hip abduction    Hip adduction    Hip internal rotation    Hip external rotation    Knee flexion    Knee extension    Ankle dorsiflexion    Ankle plantarflexion    Ankle inversion    Ankle eversion     (Blank rows = not tested)  LOWER EXTREMITY MMT:    MMT Right Eval 05/30/2024 Left Eval 05/30/2024  Hip flexion (seated) 4-/5, slight pain 4-/5 slight  pain  Hip extension (prone) 3/5 highly painful 3/5 highly painful  Hip abduction (sidelying) 4/5 slight pain 4-/5 slight pain  Hip adduction    Hip internal rotation    Hip external rotation    Knee flexion (seated) 4+/5 slight pain 4+/5 slight pain  Knee extension (seated) 4+/5 slight pain 4+/5 slight pain  Ankle dorsiflexion    Ankle plantarflexion    Ankle inversion    Ankle eversion     (Blank rows = not tested)  LUMBAR SPECIAL TESTS:   FABER: neg, limited ROM  FADIR: highly limited IR, SLR: neg on L, slight pos on R increase with DF   FUNCTIONAL TESTS:  Not test on eval  GAIT: Distance walked: not formally assessed  Assistive device utilized: None Level of assistance: Complete Independence Comments: antalgic gait pattern, increased knee flexion during stance (bilat), Lt lateral lean with Lt stance   TREATMENT DATE:  06/17/2024 ***  05/30/2024   TherEx:  HEP handout provided with patient performing one set of each exercise for appropriate form. Verbal and tactile cues required. PT discussed importance of mobility, pain vs discomfort with stretching, and regression to exercises provided.  PATIENT EDUCATION:  Education details: HEP, importance of mobility, regressions, pain vs discomfort  Person educated: Patient Education method: Explanation, Demonstration, Tactile cues, Verbal cues, and Handouts Education comprehension: verbalized understanding, returned demonstration, verbal cues required, and tactile cues required  HOME EXERCISE PROGRAM: Access Code: [email or web address] URL: [email or web address] Date: 05/30/2024 Prepared by: Susannah Daring  Exercises - Supine Lower Trunk Rotation  - 1 x daily - 7 x weekly - 3 sets - 10 reps - 2-3s hold - Seated Single Knee to Chest  - 1 x daily - 7 x weekly - 1-2 sets - 30s hold - Seated Hamstring Stretch  - 1  x daily - 7 x weekly - 1-2 sets - 30s hold - Supine Bridge  - 1 x daily - 7 x weekly - 1-2 sets - 10 reps - 2-3s hold - Supine Figure 4 Supported  - 1 x daily - 7 x weekly - 1-2 sets - 30s hold  ASSESSMENT:  CLINICAL IMPRESSION: ***  Patient is a 68 y.o. M who was seen today for physical therapy evaluation and treatment for Low back pain with functional mobility deficits, strength deficits, flexibility deficits, and high pain levels. Patient has undergone multiple bilat knee surgeries, starting in 1990s, as well as received several injection for his low back pain, over the past few years. Patient is mainly limited due to high pain levels with mobility and ROM deficits. Patient will benefit from skilled physical therapy to improve upon deficits noted above.   OBJECTIVE IMPAIRMENTS: Abnormal gait, decreased activity tolerance, decreased balance, decreased mobility, difficulty walking, decreased ROM, decreased strength, hypomobility, impaired flexibility, postural dysfunction, and pain.   ACTIVITY LIMITATIONS: bending, sitting, standing, squatting, stairs, and bed mobility  PARTICIPATION LIMITATIONS: community activity, occupation, and yard work  PERSONAL FACTORS: 3+ comorbidities: history of NSTEMI, dyslipidemia, HTN, history of ischemic cardiomyopathy, CAD, prediabetes are also affecting patient's functional outcome.   REHAB POTENTIAL: Good  CLINICAL DECISION MAKING: Evolving/moderate complexity  EVALUATION COMPLEXITY: Moderate   GOALS: Goals reviewed with patient? Yes  SHORT TERM GOALS: Target date: 06/20/2024  Patient will show compliance with initial HEP.  Baseline: Goal status: INITIAL  2.  Patient will report pain levels no greater than 5/10 to show improved overall quality of life. Baseline:  Goal status: INITIAL   LONG TERM GOALS: Target date: 07/25/2024  Patient will show compliance and independence with final HEP in order to maintain and progress upon functional gains  made in PT.  Baseline:  Goal status: INITIAL  2.  Patient will report pain levels no greater than 3/10 to show improved overall quality of life. Baseline:  Goal status: INITIAL  3.  Patient will increase PSFS to at least 4.33 in order to show improved subjective disability rating. Baseline:  Goal status: INITIAL  4.  Patient will improve back extension ROM to at least 50% in order to show improved functional mobility.  Baseline:  Goal status: INITIAL  5.  Patient will improve hip extension MMT to at least 4/5 in order to show improved biomechanics with functional activities. Baseline:  Goal status: INITIAL   PLAN:  PT FREQUENCY: 1-2x/week  PT DURATION: 8 weeks  PLANNED INTERVENTIONS: [id number]- PT Re-evaluation, 97750- Physical Performance Testing, 97110-Therapeutic exercises, 97530- Therapeutic activity, [id number]- Neuromuscular re-education, 97535- Self Care, [id number]- Manual therapy, [id number]- Gait training, [phone]- Electrical stimulation (unattended), [id number]- Electrical stimulation (manual), [id number]- Vasopneumatic device, [id number]- Ultrasound, [id number]- Traction (mechanical), [id number]- Ionotophoresis 4mg /ml Dexamethasone , 79439 (1-2 muscles), [id number] (3+ muscles)- Dry Needling, Patient/Family education, Balance training, Stair  training, Joint mobilization, Joint manipulation, Spinal manipulation, Spinal mobilization, Cryotherapy, and Moist heat.  PLAN FOR NEXT SESSION: *** review HEP, functional mobility, lumbar mobility, hip flexibility, hip strengthening, manual (mobs, etc.)   Susannah Daring, PT, DPT 06/16/24 3:53 PM

## 2024-06-17 ENCOUNTER — Encounter

## 2024-06-20 ENCOUNTER — Other Ambulatory Visit (INDEPENDENT_AMBULATORY_CARE_PROVIDER_SITE_OTHER): Payer: Self-pay | Admitting: Family Medicine

## 2024-06-20 DIAGNOSIS — E559 Vitamin D deficiency, unspecified: Secondary | ICD-10-CM

## 2024-06-22 NOTE — Procedures (Signed)
 Lumbar Epidural Steroid Injection - Interlaminar Approach with Fluoroscopic Guidance  Patient: Jacob Quinn      Date of Birth: [birth year] MRN: [id number] PCP: Merna Huxley, NP      Visit Date: 06/12/2024   Universal Protocol:     Consent Given By: the patient  Position: PRONE  Additional Comments: Vital signs were monitored before and after the procedure. Patient was prepped and draped in the usual sterile fashion. The correct patient, procedure, and site was verified.   Injection Procedure Details:   Procedure diagnoses: Lumbar radiculopathy [M54.16]   Meds Administered:  Meds ordered this encounter  Medications   methylPREDNISolone  acetate (DEPO-MEDROL ) injection 40 mg     Laterality: Right  Location/Site:  L3-4  Needle: 3.5 in., 20 ga. Tuohy  Needle Placement: Paramedian epidural  Findings:   -Comments: Excellent flow of contrast into the epidural space.  Procedure Details: Using a paramedian approach from the side mentioned above, the region overlying the inferior lamina was localized under fluoroscopic visualization and the soft tissues overlying this structure were infiltrated with 4 ml. of 1% Lidocaine  without Epinephrine . The Tuohy needle was inserted into the epidural space using a paramedian approach.   The epidural space was localized using loss of resistance along with counter oblique bi-planar fluoroscopic views.  After negative aspirate for air, blood, and CSF, a 2 ml. volume of Isovue -250 was injected into the epidural space and the flow of contrast was observed. Radiographs were obtained for documentation purposes.    The injectate was administered into the level noted above.   Additional Comments:  The patient tolerated the procedure well Dressing: 2 x 2 sterile gauze and Band-Aid    Post-procedure details: Patient was observed during the procedure. Post-procedure instructions were reviewed.  Patient left the clinic in stable  condition.

## 2024-06-22 NOTE — Progress Notes (Signed)
 KEEVIN PANEBIANCO - 68 y.o. male MRN [id number]  Date of birth: [birth year]  Office Visit Note: Visit Date: 06/12/2024 PCP: Merna Huxley, NP Referred by: Merna Huxley, NP  Subjective: Chief Complaint  Patient presents with   Lower Back - Pain   HPI:  [name] is a 68 y.o. male who comes in today at the request of Jacob Mcnamee, FNP for planned Right L3-4 Lumbar Interlaminar epidural steroid injection with fluoroscopic guidance.  The patient has failed conservative care including home exercise, medications, time and activity modification.  This injection will be diagnostic and hopefully therapeutic.  Please see requesting physician notes for further details and justification.   ROS Otherwise per HPI.  Assessment & Plan: Visit Diagnoses:    ICD-10-CM   1. Lumbar radiculopathy  M54.16 XR C-ARM NO REPORT    Epidural Steroid injection    methylPREDNISolone  acetate (DEPO-MEDROL ) injection 40 mg      Plan: No additional findings.   Meds & Orders:  Meds ordered this encounter  Medications   methylPREDNISolone  acetate (DEPO-MEDROL ) injection 40 mg    Orders Placed This Encounter  Procedures   XR C-ARM NO REPORT   Epidural Steroid injection    Follow-up: Return for visit to requesting provider as needed.   Procedures: No procedures performed  Lumbar Epidural Steroid Injection - Interlaminar Approach with Fluoroscopic Guidance  Patient: Jacob Quinn      Date of Birth: 01-27-1956 MRN: [id number] PCP: Merna Huxley, NP      Visit Date: 06/12/2024   Universal Protocol:     Consent Given By: the patient  Position: PRONE  Additional Comments: Vital signs were monitored before and after the procedure. Patient was prepped and draped in the usual sterile fashion. The correct patient, procedure, and site was verified.   Injection Procedure Details:   Procedure diagnoses: Lumbar radiculopathy [M54.16]   Meds Administered:  Meds ordered this encounter   Medications   methylPREDNISolone  acetate (DEPO-MEDROL ) injection 40 mg     Laterality: Right  Location/Site:  L3-4  Needle: 3.5 in., 20 ga. Tuohy  Needle Placement: Paramedian epidural  Findings:   -Comments: Excellent flow of contrast into the epidural space.  Procedure Details: Using a paramedian approach from the side mentioned above, the region overlying the inferior lamina was localized under fluoroscopic visualization and the soft tissues overlying this structure were infiltrated with 4 ml. of 1% Lidocaine  without Epinephrine . The Tuohy needle was inserted into the epidural space using a paramedian approach.   The epidural space was localized using loss of resistance along with counter oblique bi-planar fluoroscopic views.  After negative aspirate for air, blood, and CSF, a 2 ml. volume of Isovue -250 was injected into the epidural space and the flow of contrast was observed. Radiographs were obtained for documentation purposes.    The injectate was administered into the level noted above.   Additional Comments:  The patient tolerated the procedure well Dressing: 2 x 2 sterile gauze and Band-Aid    Post-procedure details: Patient was observed during the procedure. Post-procedure instructions were reviewed.  Patient left the clinic in stable condition.   Clinical History: MRI LUMBAR SPINE WITHOUT CONTRAST   TECHNIQUE: Multiplanar, multisequence MR imaging of the lumbar spine was performed. No intravenous contrast was administered.   COMPARISON:  Abdominopelvic CT 01/24/2012.   FINDINGS: Segmentation: Conventional anatomy assumed, with the last open disc space designated L5-S1.Concordant with prior imaging.   Alignment: Straightening with minimal convex right scoliosis. No focal angulation or  significant listhesis.   Vertebrae: No worrisome osseous lesion, acute fracture or pars defect. No evidence of discitis or osteomyelitis. There are multilevel endplate  degenerative changes, most advanced at L3-4 and L4-5. The lumbar pedicles are diffusely short on a congenital basis. The visualized sacroiliac joints appear unremarkable.   Conus medullaris: Extends to the T12-L1 level. The conus and cauda equina appear normal.   Paraspinal and other soft tissues: No significant paraspinal findings. Partially imaged renal cysts bilaterally for which no specific follow-up imaging is recommended.   Disc levels:   Sagittal images demonstrate no significant disc space findings within the visualized lower thoracic spine.   L1-2: Relatively preserved disc height with disc desiccation, bulging and mild bilateral facet hypertrophy. No significant spinal stenosis. Mild foraminal narrowing bilaterally without L1 nerve root encroachment.   L2-3: Chronic loss of disc height with annular disc bulging and endplate osteophytes. Mild facet and ligamentous hypertrophy. Resulting mild multifactorial spinal stenosis with mild lateral recess and foraminal narrowing bilaterally.   L3-4: Chronic advanced loss of disc height with annular disc bulging and endplate osteophytes asymmetric to the left. Moderate facet and ligamentous hypertrophy. Moderate spinal stenosis with moderate lateral recess and foraminal narrowing bilaterally.   L4-5: Loss of disc height with annular disc bulging and endplate osteophytes, progressive from remote CT. Moderate facet and ligamentous hypertrophy. Resulting moderate multifactorial spinal stenosis with moderate to severe lateral recess and foraminal narrowing bilaterally.   L5-S1: Preserved disc height and hydration. Mild facet hypertrophy. No spinal stenosis or foraminal narrowing.   IMPRESSION: 1. 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 Spondylosis has progressed from remote CT, especially at L4-5. 2. Moderate multifactorial spinal stenosis at L3-4 and L4-5 with moderate to severe lateral  recess and foraminal narrowing bilaterally at both levels. 3. Mild multifactorial spinal stenosis at L2-3 with mild lateral recess and foraminal narrowing bilaterally.     Electronically Signed   By: Elsie Perone M.D.   On: 04/30/2024 12:00     Objective:  VS:  HT:    WT:   BMI:     BP:137/86  HR:86bpm  TEMP: ( )  RESP:  Physical Exam Vitals and nursing note reviewed.  Constitutional:      General: He is not in acute distress.    Appearance: Normal appearance. He is not ill-appearing.  HENT:     Head: Normocephalic and atraumatic.     Right Ear: External ear normal.     Left Ear: External ear normal.     Nose: No congestion.  Eyes:     Extraocular Movements: Extraocular movements intact.  Cardiovascular:     Rate and Rhythm: Normal rate.     Pulses: Normal pulses.  Pulmonary:     Effort: Pulmonary effort is normal. No respiratory distress.  Abdominal:     General: There is no distension.     Palpations: Abdomen is soft.  Musculoskeletal:        General: No tenderness or signs of injury.     Cervical back: Neck supple.     Right lower leg: No edema.     Left lower leg: No edema.     Comments: Patient has good distal strength without clonus.  Skin:    Findings: No erythema or rash.  Neurological:     General: No focal deficit present.     Mental Status: He is alert and oriented to person, place, and time.     Sensory: No sensory deficit.  Motor: No weakness or abnormal muscle tone.     Coordination: Coordination normal.  Psychiatric:        Mood and Affect: Mood normal.        Behavior: Behavior normal.      Imaging: No results found.

## 2024-06-23 ENCOUNTER — Other Ambulatory Visit (INDEPENDENT_AMBULATORY_CARE_PROVIDER_SITE_OTHER): Payer: Self-pay | Admitting: Family Medicine

## 2024-06-23 DIAGNOSIS — R7303 Prediabetes: Secondary | ICD-10-CM

## 2024-06-24 ENCOUNTER — Inpatient Hospital Stay
Admission: RE | Admit: 2024-06-24 | Discharge: 2024-06-24 | Disposition: A | Discharge status: Home / Self Care | Source: Ambulatory Visit | Attending: Adult Health

## 2024-06-24 DIAGNOSIS — R1011 Right upper quadrant pain: Secondary | ICD-10-CM

## 2024-06-24 DIAGNOSIS — K760 Fatty (change of) liver, not elsewhere classified: Secondary | ICD-10-CM | POA: Diagnosis not present

## 2024-06-24 MED ORDER — IOPAMIDOL (ISOVUE-370) INJECTION 76%
80.0000 mL | Freq: Once | INTRAVENOUS | Status: AC | PRN
  Administered 2024-06-24: 80 mL via INTRAVENOUS

## 2024-06-25 ENCOUNTER — Ambulatory Visit: Payer: Self-pay | Admitting: Adult Health

## 2024-06-25 ENCOUNTER — Ambulatory Visit (INDEPENDENT_AMBULATORY_CARE_PROVIDER_SITE_OTHER): Admitting: Physician Assistant

## 2024-06-25 ENCOUNTER — Ambulatory Visit (INDEPENDENT_AMBULATORY_CARE_PROVIDER_SITE_OTHER): Admitting: Family Medicine

## 2024-06-25 ENCOUNTER — Encounter (INDEPENDENT_AMBULATORY_CARE_PROVIDER_SITE_OTHER): Payer: Self-pay | Admitting: Physician Assistant

## 2024-06-25 VITALS — BP 143/90 | HR 58 | Temp 98.6°F | Ht 72.0 in | Wt 251.0 lb

## 2024-06-25 DIAGNOSIS — N320 Bladder-neck obstruction: Secondary | ICD-10-CM

## 2024-06-25 DIAGNOSIS — E559 Vitamin D deficiency, unspecified: Secondary | ICD-10-CM

## 2024-06-25 DIAGNOSIS — Z6834 Body mass index (BMI) 34.0-34.9, adult: Secondary | ICD-10-CM

## 2024-06-25 DIAGNOSIS — M549 Dorsalgia, unspecified: Secondary | ICD-10-CM

## 2024-06-25 DIAGNOSIS — E669 Obesity, unspecified: Secondary | ICD-10-CM | POA: Diagnosis not present

## 2024-06-25 DIAGNOSIS — E538 Deficiency of other specified B group vitamins: Secondary | ICD-10-CM

## 2024-06-25 DIAGNOSIS — E7841 Elevated Lipoprotein(a): Secondary | ICD-10-CM | POA: Diagnosis not present

## 2024-06-25 DIAGNOSIS — I10 Essential (primary) hypertension: Secondary | ICD-10-CM

## 2024-06-25 DIAGNOSIS — R7303 Prediabetes: Secondary | ICD-10-CM

## 2024-06-25 DIAGNOSIS — E7849 Other hyperlipidemia: Secondary | ICD-10-CM | POA: Diagnosis not present

## 2024-06-25 MED ORDER — CYANOCOBALAMIN 500 MCG PO TABS: 500.0000 ug | ORAL_TABLET | Freq: Every day | ORAL | Status: AC

## 2024-06-25 MED ORDER — VITAMIN D (ERGOCALCIFEROL) 1.25 MG (50000 UNIT) PO CAPS: ORAL_CAPSULE | ORAL | 1 refills | Status: DC

## 2024-06-25 MED ORDER — METFORMIN HCL 500 MG PO TABS: 500.0000 mg | ORAL_TABLET | Freq: Two times a day (BID) | ORAL | 1 refills | Status: DC

## 2024-06-25 NOTE — Progress Notes (Unsigned)
 SUBJECTIVE: Discussed the use of AI scribe software for clinical note transcription with the patient, who gave verbal consent to proceed.  Chief Complaint: Obesity  Interim History: He is down 3 lbs since his last visit 04/28/24. The BIA scale results today are very skewed from previous visits.   He does report being much less active due to back pain but not sure results are valid and will monitor over the next month.   Jacob Quinn is here to discuss his progress with his obesity treatment plan. He is on the Category 2 Plan and states he is following his eating plan approximately 30 % of the time. He states he is exercising 0 minutes 0 times per week.  Jacob Quinn is a 68 year old male who presents for follow-up of his obesity treatment plan.  He recently sustained a back injury and received a steroid injection(LESI) a few days ago. The pain is improving, allowing for better mobility, but the injury has restricted his ability to exercise. 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  He has a history of coronary artery disease with ischemic cardiomyopathy, hypertension, prediabetes, vitamin D  deficiency, and hyperlipidemia with elevated lipoprotein A. He is currently taking metformin  twice a day but has not been checking his blood sugar levels recently. Recent steroid use for his back may have affected his blood sugar levels. He has a prescription for vitamin D  and was advised to take vitamin B12, though he does not recall receiving it.  In terms of diet, he reports eating convenience foods due to his living situation and back pain. Breakfast often consists of sandwiches with eggs, typically picked up from restaurants like [employer] or [employer]. Lunch might be a bologna sandwich on white bread, though he is open to trying [place] and lower carbohydrate bread options. For dinner, he prefers fish, both fried and broiled, often accompanied by a  baked potato or salad. He lives alone and finds it challenging to cook, but he has access to a microwave for preparing frozen vegetables.  He occasionally drinks sweet tea but does not frequently consume sugar-sweetened beverages. OBJECTIVE: Visit Diagnoses: Problem List Items Addressed This Visit     Generalized obesity   Relevant Medications   metFORMIN  (GLUCOPHAGE ) 500 MG tablet   Prediabetes - Primary   Relevant Medications   metFORMIN  (GLUCOPHAGE ) 500 MG tablet   Other hyperlipidemia   Vitamin D  deficiency   Relevant Medications   Vitamin D , Ergocalciferol , (DRISDOL ) 1.25 MG (50000 UNIT) CAPS capsule   Essential hypertension (Chronic)   Other Visit Diagnoses       Elevated lipoprotein(a)         Vitamin B 12 deficiency       Relevant Medications   cyanocobalamin  (VITAMIN B12) 500 MCG tablet     BMI 34.0-34.9,adult Current BMI 34.1         Obesity Obesity management is ongoing. There is a discrepancy in weight measurements, with an increase in body adipose tissue noted, but this may not be accurate. Recent back injury has limited physical activity, potentially affecting weight management. Dietary habits include consumption of convenience and fast foods, which may contribute to weight gain. - Provide dining out guide with healthier options for fast food restaurants - Encourage refocusing on dietary intake and physical activity as back pain allows - Provide meal plan for category two plan - Encourage increased intake of vegetables and fruits, such as melons, berries, and apples  Back pain due to injury Recent back injury has resulted in pain and limited mobility. A recent injection was administered, and he reports some improvement in symptoms. Plan: Follow up with orthopedics as instructed.  Exercise as able/directed by orthopedics Continue to work on nutrition plan to promote weight loss.   Prediabetes Prediabetes management is ongoing. Recent steroid use for back pain  may have affected blood sugar levels. Current dietary habits and lack of regular blood sugar monitoring are concerns. - Encourage resumption of regular blood sugar monitoring - Continue/refill  metformin  500 mg twice daily as prescribed Continue working on nutrition plan to decrease simple carbohydrates, increase lean proteins and exercise to promote weight loss, improve glycemic control and prevent progression to Type 2 diabetes.    Hyperlipidemia with elevated lipoprotein(a)/CAD with cardiomyopathy Current dietary habits may impact lipid levels as he has been having to pick up meals frequently due to back pain/limited mobility. He has not been able to cook for himself and lives alone. On Lipitor  80 mg daily    Plan: Continue usual medications.  Dining out guide for better choices when eating out and try to incorporate more vegetables and fruits ( increase fiber and water ) Continue to work on nutrition plan -decreasing simple carbohydrates, increasing lean proteins, decreasing saturated fats and cholesterol , avoiding trans fats and exercise as able to promote weight loss, improve lipids and decrease cardiovascular risks.   Vitamin D  deficiency Vitamin D  deficiency management is ongoing.  Last vitamin D  Lab Results  Component Value Date   VD25OH 25.0 (L) 11/20/2023    - Continue karyl Ergocalciferol  50,000 units twice weekly as prescribed Low vitamin D  levels can be associated with adiposity and may result in leptin resistance and weight gain. Also associated with fatigue.  Currently on vitamin D  supplementation without any adverse effects such as nausea, vomiting or muscle weakness.    Low Serum B 12 level Lab Results  Component Value Date   VITAMINB12 497 11/20/2023   - Encourage obtaining vitamin B12 tablets (500 mcg daily) for energy support Meds ordered this encounter  Medications   metFORMIN  (GLUCOPHAGE ) 500 MG tablet    Sig: Take 1 tablet (500 mg total) by mouth 2 (two)  times daily with a meal. 1 tab with lunch and 1 with dinner    Dispense:  60 tablet    Refill:  1   Vitamin D , Ergocalciferol , (DRISDOL ) 1.25 MG (50000 UNIT) CAPS capsule    Sig: 1 tab twice wkly    Dispense:  8 capsule    Refill:  1   cyanocobalamin  (VITAMIN B12) 500 MCG tablet    Sig: Take 1 tablet (500 mcg total) by mouth daily.    Vitals Temp: 98.6 F (37 C) BP: (!) 143/90 Pulse Rate: (!) 58 SpO2: 99 %   Anthropometric Measurements Height: 6' (1.829 m) Weight: 251 lb (113.9 kg) BMI (Calculated): 34.03 Weight at Last Visit: 254lb Weight Lost Since Last Visit: 3lb Weight Gained Since Last Visit: 0lb Starting Weight: 276lb Total Weight Loss (lbs): 25 lb (11.3 kg)   Body Composition  Body Fat %: 46.4 % Fat Mass (lbs): 116.8 lbs Muscle Mass (lbs): 128.2 lbs Total Body Water  (lbs): 103.6 lbs Visceral Fat Rating : 14   Other Clinical Data Fasting: No Labs: no Today's Visit #: 19 Starting Date: 09/19/22     ASSESSMENT AND PLAN:  Diet: Mohsen is currently in the action stage of change. As such, his goal is to continue with weight loss efforts.  He has agreed to Category 2 Plan.  Exercise: Markail has been instructed exercise as directed/tolerated due to back issues for weight loss and overall health benefits.   Behavior Modification:  We discussed the following Behavioral Modification Strategies today: increasing lean protein intake, decreasing simple carbohydrates, increasing vegetables, increase H2O intake, increase high fiber foods, meal planning and cooking strategies, avoiding temptations, planning for success, and dining out guide. We discussed various medication options to help Arby with his weight loss efforts and we both agreed to continue current treatment plan.  Return in about 4 weeks (around 07/23/2024).SABRA He was informed of the importance of frequent follow up visits to maximize his success with intensive lifestyle modifications for his multiple  health conditions.  Attestation Statements:   Reviewed by clinician on day of visit: allergies, medications, problem list, medical history, surgical history, family history, social history, and previous encounter notes.   Time spent on visit including pre-visit chart review and post-visit care and charting was *** minutes.    Shanyiah Conde, PA-C

## 2024-07-09 ENCOUNTER — Other Ambulatory Visit: Payer: Self-pay | Admitting: Adult Health

## 2024-07-29 ENCOUNTER — Ambulatory Visit (INDEPENDENT_AMBULATORY_CARE_PROVIDER_SITE_OTHER): Admitting: Family Medicine

## 2024-08-14 ENCOUNTER — Ambulatory Visit (INDEPENDENT_AMBULATORY_CARE_PROVIDER_SITE_OTHER): Admitting: Family Medicine

## 2024-08-14 ENCOUNTER — Encounter (INDEPENDENT_AMBULATORY_CARE_PROVIDER_SITE_OTHER): Payer: Self-pay | Admitting: Family Medicine

## 2024-08-14 VITALS — BP 146/78 | HR 62 | Temp 97.7°F | Ht 72.0 in | Wt 263.0 lb

## 2024-08-14 DIAGNOSIS — E559 Vitamin D deficiency, unspecified: Secondary | ICD-10-CM | POA: Diagnosis not present

## 2024-08-14 DIAGNOSIS — I10 Essential (primary) hypertension: Secondary | ICD-10-CM | POA: Diagnosis not present

## 2024-08-14 DIAGNOSIS — Z6835 Body mass index (BMI) 35.0-35.9, adult: Secondary | ICD-10-CM | POA: Diagnosis not present

## 2024-08-14 DIAGNOSIS — E669 Obesity, unspecified: Secondary | ICD-10-CM

## 2024-08-14 DIAGNOSIS — Z6834 Body mass index (BMI) 34.0-34.9, adult: Secondary | ICD-10-CM

## 2024-08-14 DIAGNOSIS — R7303 Prediabetes: Secondary | ICD-10-CM | POA: Diagnosis not present

## 2024-08-14 DIAGNOSIS — R638 Other symptoms and signs concerning food and fluid intake: Secondary | ICD-10-CM | POA: Diagnosis not present

## 2024-08-14 MED ORDER — VITAMIN D (ERGOCALCIFEROL) 1.25 MG (50000 UNIT) PO CAPS: ORAL_CAPSULE | ORAL | 0 refills | Status: AC

## 2024-08-14 MED ORDER — BUPROPION HCL ER (XL) 150 MG PO TB24: 150.0000 mg | ORAL_TABLET | Freq: Every morning | ORAL | 0 refills | Status: AC

## 2024-08-14 MED ORDER — METFORMIN HCL ER 500 MG PO TB24: ORAL_TABLET | ORAL | 0 refills | Status: AC

## 2024-08-14 NOTE — Progress Notes (Signed)
 Barnie DOROTHA Jenkins, D.O.  ABFM, ABOM Specializing in Clinical Bariatric Medicine  Office located at: [address] [place], [place]  [address]      A) FOR THE CHRONIC DISEASE OF OBESITY:  Chief complaint: Obesity Jacob Quinn is here to discuss his progress with his obesity treatment plan.   History of present illness / Interval history:  Jacob Quinn is here today for his follow-up office visit.  Since last OV on 06/25/24, pt is up 12 lbs. Pt states that his back is still bothering him but he is doing better. He is trying to get back on track.      06/25/24 11:00 08/14/24   Body Fat % 46.4 % 37 %  Muscle Mass (lbs) 128.2 lbs 157.8 lbs  Fat Mass (lbs) 116.8 lbs 97.4 lbs  Total Body Water  (lbs) 103.6 lbs 134.2 lbs  Visceral Fat Rating  14 23    Counseling done on how various foods will affect these numbers and how to maximize success   Total lbs lost to date: -13 lbs Total Fat Mass in lbs lost to date: - 8 lbs Total weight loss percentage to date: - 4.71 %    Generalized obesity- Start BMI 37.4 09/19/22 BMI 34.0-34.9,adult Current BMI 35.66  Nutrition Therapy He is on the Category 2 Plan and states he is following his eating plan approximately 50 % of the time.   - Tracking Calories/Macros: no   - Eating More Whole Foods: yes  - Adequate Protein Intake: no   - Adequate Water  Intake: yes  - Skipping Meals: no   - Sleeping 7-9 Hours/ Night: no    Jacob Quinn is currently in the action stage of change. As such, his goal is to continue weight management plan.  He has agreed to: continue current plan   Physical Activity Jacob Quinn is not exercising.   Jacob Quinn has been advised to work up to 300-450 minutes of moderate intensity aerobic activity a week and strengthening exercises 2-3 times per week for cardiovascular health, weight loss maintenance and preservation of muscle mass.  He has agreed to : Think about enjoyable ways to increase daily physical activity and  overcoming barriers to exercise, Increase physical activity in their day and reduce sedentary time (increase NEAT)., and Increase volume of physical activity to a goal of 240 minutes a week   Behavioral Modifications Evidence-based interventions for health behavior change were utilized today including the discussion of  1) self monitoring techniques:    2) problem-solving barriers:    3) self care:    - drink water   4) SMART goals for next OV:    - get back on track   Regarding patient's less desirable eating habits and patterns, we employed the technique of small changes.   We discussed the following today: work on meal planning and preparation Additional resources provided today: None   Medical Interventions/ Pharmacotherapy Previous Bariatric surgery: n/a Pharmacotherapy for weight loss: He is currently taking Metformin  500 mg BID for medical weight loss.    We discussed various medication options to help Jacob Quinn with his weight loss efforts and we both agreed to : Continue with current nutritional and behavioral strategies and medications.    B) OBESITY RELATED CONDITIONS ADDRESSED TODAY:  Prediabetes Assessment & Plan Lab Results  Component Value Date   HGBA1C 5.8 02/05/2024   HGBA1C 5.7 (H) 11/20/2023   HGBA1C 5.6 06/25/2023   INSULIN  7.4 01/25/2023   INSULIN  26.5 (H) 09/19/2022  On Metformin  500 mg BID. Good compliance and tolerance. Pt reports that he has had some cravings recently. Due to pt struggling to remember to take pill twice a day mutual agreement to Switch to Metformin  XR daily. Will reassess at next OV.      Essential hypertension - poorly controlled Assessment & Plan BP Readings from Last 3 Encounters:  08/14/24 (!) 146/78  06/25/24 (!) 143/90  06/12/24 137/86   The ASCVD Risk score (Arnett DK, et al., 2019) failed to calculate for the following reasons:   Risk score cannot be calculated because patient has a medical history suggesting  prior/existing ASCVD  Lab Results  Component Value Date   CREATININE 1.20 06/04/2024   On Norvasc  1 tablet daily and Tenormin 25 mg daily. BP today is not at goal- poorly controlled. Pt states that this could be due to pain, stress and not being able to sleep recently. Recommended pt to check BP at home about 3 to 4 times a week. Goal BP is 130/80. Stressed the importance about having a controlled BP and how it could mess with kidney function and stress on the heart. Continue with anti-hypertensive medications and checking BP at home.      Abnormal craving due to metabolic dysfunction Assessment & Plan Pt states that he has had an increase in cravings. He states that his could be due to frustration and stress that his back is giving him right now. Educated pt on Wellbutrin and how it can help with his emotional eating. Explained to pt the risks/ and benefits. Pt denies any history of seizure disorder. Mutually agreed to Begin Wellbutrin XL 150 mg daily. Continue following prudent meal plan. Educated pt on how your body responds to simple carbs due to adipose tissue.     Vitamin D  deficiency Assessment & Plan Lab Results  Component Value Date   VD25OH 25.0 (L) 11/20/2023   VD25OH 26.6 (L) 06/25/2023   VD25OH 33.4 01/25/2023   On ERGO 50K twice weekly. Good compliance and tolerance. No acute concerns today. Will obtain labs in the future. Continue supplementation.      Medications Discontinued During This Encounter  Medication Reason   metFORMIN  (GLUCOPHAGE ) 500 MG tablet Dose change   Vitamin D , Ergocalciferol , (DRISDOL ) 1.25 MG (50000 UNIT) CAPS capsule Reorder     Meds ordered this encounter  Medications   buPROPion (WELLBUTRIN XL) 150 MG 24 hr tablet    Sig: Take 1 tablet (150 mg total) by mouth in the morning.    Dispense:  90 tablet    Refill:  0   Vitamin D , Ergocalciferol , (DRISDOL ) 1.25 MG (50000 UNIT) CAPS capsule    Sig: 1 tab twice wkly    Dispense:  24 capsule     Refill:  0   metFORMIN  (GLUCOPHAGE -XR) 500 MG 24 hr tablet    Sig: 1 po every day with meal    Dispense:  30 tablet    Refill:  0      Follow up:   Return 09/17/2024 at 10:40 AM  He was informed of the importance of frequent follow up visits to maximize his success with intensive lifestyle modifications for his multiple health conditions.   Weight Summary and Biometrics   Weight Lost Since Last Visit: 0lb  Weight Gained Since Last Visit: 12lb    Vitals Temp: 97.7 F (36.5 C) BP: (!) 146/78 Pulse Rate: 62 SpO2: 98 %   Anthropometric Measurements Height: 6' (1.829 m) Weight: 263 lb (119.3 kg) BMI (  Calculated): 35.66 Weight at Last Visit: 251lb Weight Lost Since Last Visit: 0lb Weight Gained Since Last Visit: 12lb Starting Weight: 276lb Total Weight Loss (lbs): 13 lb (5.897 kg)   Body Composition  Body Fat %: 37 % Fat Mass (lbs): 97.4 lbs Muscle Mass (lbs): 157.8 lbs Total Body Water  (lbs): 134.2 lbs Visceral Fat Rating : 23   Other Clinical Data Fasting: no Labs: no Today's Visit #: 20 Starting Date: 09/19/22    Objective:   PHYSICAL EXAM: Blood pressure (!) 146/78, pulse 62, temperature 97.7 F (36.5 C), height 6' (1.829 m), weight 263 lb (119.3 kg), SpO2 98%. Body mass index is 35.67 kg/m.  General: he is overweight, cooperative and in no acute distress. PSYCH: Has normal mood, affect and thought process.   HEENT: EOMI, sclerae are anicteric. Lungs: Normal breathing effort, no conversational dyspnea. Extremities: Moves * 4 Neurologic: A and O * 3, good insight  DIAGNOSTIC DATA REVIEWED: BMET    Component Value Date/Time   NA 137 06/04/2024 1312   NA 140 11/20/2023 0956   K 3.9 06/04/2024 1312   CL 100 06/04/2024 1312   CO2 31 06/04/2024 1312   GLUCOSE 81 06/04/2024 1312   BUN 12 06/04/2024 1312   BUN 9 11/20/2023 0956   CREATININE 1.20 06/04/2024 1312   CALCIUM  9.4 06/04/2024 1312   GFRNONAA >60 04/23/2018 0544   GFRAA >60  04/23/2018 0544   Lab Results  Component Value Date   HGBA1C 5.8 02/05/2024   HGBA1C 5.6 01/30/2015   Lab Results  Component Value Date   INSULIN  7.4 01/25/2023   INSULIN  26.5 (H) 09/19/2022   Lab Results  Component Value Date   TSH 1.81 02/05/2024   CBC    Component Value Date/Time   WBC 6.6 06/04/2024 1312   RBC 4.79 06/04/2024 1312   HGB 14.1 06/04/2024 1312   HGB 14.7 11/20/2023 0956   HCT 42.9 06/04/2024 1312   HCT 44.9 11/20/2023 0956   PLT 250.0 06/04/2024 1312   PLT 289 11/20/2023 0956   MCV 89.5 06/04/2024 1312   MCV 90 11/20/2023 0956   MCH 29.6 11/20/2023 0956   MCH 27.8 04/23/2018 0544   MCHC 32.9 06/04/2024 1312   RDW 13.7 06/04/2024 1312   RDW 13.9 11/20/2023 0956   Iron Studies No results found for: IRON, TIBC, FERRITIN, IRONPCTSAT Lipid Panel     Component Value Date/Time   CHOL 197 02/05/2024 1116   CHOL 176 11/20/2023 0956   TRIG 152.0 (H) 02/05/2024 1116   HDL 42.10 02/05/2024 1116   HDL 47 11/20/2023 0956   CHOLHDL 5 02/05/2024 1116   VLDL 30.4 02/05/2024 1116   LDLCALC 124 (H) 02/05/2024 1116   LDLCALC 108 (H) 11/20/2023 0956   LDLDIRECT 110.4 02/19/2007 0819   Hepatic Function Panel     Component Value Date/Time   PROT 7.7 06/04/2024 1312   PROT 7.4 11/20/2023 0956   ALBUMIN 4.1 06/04/2024 1312   ALBUMIN 4.4 11/20/2023 0956   AST 20 06/04/2024 1312   ALT 13 06/04/2024 1312   ALKPHOS 69 06/04/2024 1312   BILITOT 0.8 06/04/2024 1312   BILITOT 0.5 11/20/2023 0956   BILIDIR 0.1 02/06/2017 0808      Component Value Date/Time   TSH 1.81 02/05/2024 1116   Nutritional Lab Results  Component Value Date   VD25OH 25.0 (L) 11/20/2023   VD25OH 26.6 (L) 06/25/2023   VD25OH 33.4 01/25/2023    Attestations:   LILLETTE Sonny Laroche, acting as a medical  scribe for Barnie Jenkins, DO., have compiled all relevant documentation for today's office visit on behalf of Barnie Jenkins, DO, while in the presence of [employer],  DO.    I have reviewed the above documentation for accuracy and completeness, and I agree with the above. Barnie JINNY Jenkins, D.O.  The 21st Century Cures Act was signed into law in 2016 which includes the topic of electronic health records.  This provides immediate access to information in MyChart.  This includes consultation notes, operative notes, office notes, lab results and pathology reports.  If you have any questions about what you read please let us  know at your next visit so we can discuss your concerns and take corrective action if need be.  We are right here with you.

## 2024-08-27 DIAGNOSIS — R350 Frequency of micturition: Secondary | ICD-10-CM | POA: Diagnosis not present

## 2024-08-27 DIAGNOSIS — N401 Enlarged prostate with lower urinary tract symptoms: Secondary | ICD-10-CM | POA: Diagnosis not present

## 2024-08-27 DIAGNOSIS — R351 Nocturia: Secondary | ICD-10-CM | POA: Diagnosis not present

## 2024-08-27 DIAGNOSIS — R3912 Poor urinary stream: Secondary | ICD-10-CM | POA: Diagnosis not present

## 2024-08-31 ENCOUNTER — Other Ambulatory Visit (INDEPENDENT_AMBULATORY_CARE_PROVIDER_SITE_OTHER): Payer: Self-pay | Admitting: Physician Assistant

## 2024-08-31 DIAGNOSIS — R7303 Prediabetes: Secondary | ICD-10-CM

## 2024-09-01 ENCOUNTER — Encounter: Payer: Self-pay | Admitting: Radiology

## 2024-09-17 ENCOUNTER — Ambulatory Visit (INDEPENDENT_AMBULATORY_CARE_PROVIDER_SITE_OTHER): Payer: Self-pay | Admitting: Family Medicine

## 2024-10-08 ENCOUNTER — Other Ambulatory Visit: Payer: Self-pay | Admitting: Adult Health

## 2024-10-15 ENCOUNTER — Encounter (INDEPENDENT_AMBULATORY_CARE_PROVIDER_SITE_OTHER): Payer: Self-pay

## 2024-10-15 ENCOUNTER — Ambulatory Visit (INDEPENDENT_AMBULATORY_CARE_PROVIDER_SITE_OTHER): Admitting: Family Medicine

## 2024-11-11 ENCOUNTER — Ambulatory Visit (INDEPENDENT_AMBULATORY_CARE_PROVIDER_SITE_OTHER): Admitting: Family Medicine

## 2024-11-14 ENCOUNTER — Other Ambulatory Visit (INDEPENDENT_AMBULATORY_CARE_PROVIDER_SITE_OTHER): Payer: Self-pay | Admitting: Family Medicine

## 2024-11-14 DIAGNOSIS — R638 Other symptoms and signs concerning food and fluid intake: Secondary | ICD-10-CM

## 2024-11-26 ENCOUNTER — Ambulatory Visit (INDEPENDENT_AMBULATORY_CARE_PROVIDER_SITE_OTHER): Admitting: Family Medicine

## 2024-12-03 ENCOUNTER — Other Ambulatory Visit: Payer: Self-pay | Admitting: Urology

## 2024-12-03 DIAGNOSIS — N401 Enlarged prostate with lower urinary tract symptoms: Secondary | ICD-10-CM

## 2024-12-09 ENCOUNTER — Ambulatory Visit (INDEPENDENT_AMBULATORY_CARE_PROVIDER_SITE_OTHER): Admitting: Family Medicine
